# Patient Record
Sex: Female | Born: 1950 | Race: Black or African American | Hispanic: No | Marital: Single | Smoking: Former smoker
Health system: Southern US, Community
[De-identification: ages and names within clinical notes are randomized; demographics above are authoritative.]

## PROBLEM LIST (undated history)

## (undated) DIAGNOSIS — J449 Chronic obstructive pulmonary disease, unspecified: Secondary | ICD-10-CM

## (undated) DIAGNOSIS — I10 Essential (primary) hypertension: Secondary | ICD-10-CM

## (undated) DIAGNOSIS — I429 Cardiomyopathy, unspecified: Secondary | ICD-10-CM

## (undated) DIAGNOSIS — M1990 Unspecified osteoarthritis, unspecified site: Secondary | ICD-10-CM

## (undated) DIAGNOSIS — R011 Cardiac murmur, unspecified: Secondary | ICD-10-CM

## (undated) DIAGNOSIS — Z720 Tobacco use: Secondary | ICD-10-CM

## (undated) DIAGNOSIS — C801 Malignant (primary) neoplasm, unspecified: Secondary | ICD-10-CM

## (undated) HISTORY — DX: Malignant (primary) neoplasm, unspecified: C80.1

## (undated) HISTORY — PX: ABDOMINAL HYSTERECTOMY: SHX81

## (undated) HISTORY — PX: BILATERAL OOPHORECTOMY: SHX1221

---

## 2000-01-12 ENCOUNTER — Other Ambulatory Visit: Admission: RE | Admit: 2000-01-12 | Discharge: 2000-01-12 | Payer: Self-pay | Admitting: Family Medicine

## 2001-10-03 ENCOUNTER — Other Ambulatory Visit: Admission: RE | Admit: 2001-10-03 | Discharge: 2001-10-03 | Payer: Self-pay | Admitting: Family Medicine

## 2001-10-16 ENCOUNTER — Ambulatory Visit (HOSPITAL_COMMUNITY): Admission: RE | Admit: 2001-10-16 | Discharge: 2001-10-16 | Payer: Self-pay | Admitting: Family Medicine

## 2002-11-27 ENCOUNTER — Other Ambulatory Visit: Admission: RE | Admit: 2002-11-27 | Discharge: 2002-11-27 | Payer: Self-pay | Admitting: Family Medicine

## 2003-12-11 ENCOUNTER — Ambulatory Visit (HOSPITAL_COMMUNITY): Admission: RE | Admit: 2003-12-11 | Discharge: 2003-12-11 | Payer: Self-pay | Admitting: General Surgery

## 2004-01-20 ENCOUNTER — Other Ambulatory Visit: Admission: RE | Admit: 2004-01-20 | Discharge: 2004-01-20 | Payer: Self-pay | Admitting: Family Medicine

## 2004-08-19 ENCOUNTER — Ambulatory Visit (HOSPITAL_COMMUNITY): Admission: RE | Admit: 2004-08-19 | Discharge: 2004-08-19 | Payer: Self-pay | Admitting: Family Medicine

## 2005-12-06 ENCOUNTER — Other Ambulatory Visit: Admission: RE | Admit: 2005-12-06 | Discharge: 2005-12-06 | Payer: Self-pay | Admitting: Family Medicine

## 2005-12-29 ENCOUNTER — Ambulatory Visit (HOSPITAL_COMMUNITY): Admission: RE | Admit: 2005-12-29 | Discharge: 2005-12-29 | Payer: Self-pay | Admitting: Obstetrics

## 2006-02-10 ENCOUNTER — Inpatient Hospital Stay (HOSPITAL_COMMUNITY): Admission: RE | Admit: 2006-02-10 | Discharge: 2006-02-12 | Payer: Self-pay | Admitting: Obstetrics

## 2006-02-10 ENCOUNTER — Encounter (INDEPENDENT_AMBULATORY_CARE_PROVIDER_SITE_OTHER): Payer: Self-pay | Admitting: Specialist

## 2007-09-03 ENCOUNTER — Other Ambulatory Visit: Admission: RE | Admit: 2007-09-03 | Discharge: 2007-09-03 | Payer: Self-pay | Admitting: Family Medicine

## 2008-02-28 ENCOUNTER — Encounter: Admission: RE | Admit: 2008-02-28 | Discharge: 2008-02-28 | Payer: Self-pay | Admitting: Family Medicine

## 2008-08-12 ENCOUNTER — Encounter: Admission: RE | Admit: 2008-08-12 | Discharge: 2008-08-12 | Payer: Self-pay | Admitting: Orthopedic Surgery

## 2010-06-27 ENCOUNTER — Encounter: Payer: Self-pay | Admitting: Family Medicine

## 2010-09-29 ENCOUNTER — Other Ambulatory Visit: Payer: Self-pay | Admitting: Orthopedic Surgery

## 2010-09-29 ENCOUNTER — Ambulatory Visit
Admission: RE | Admit: 2010-09-29 | Discharge: 2010-09-29 | Disposition: A | Discharge status: Home / Self Care | Payer: PRIVATE HEALTH INSURANCE | Source: Ambulatory Visit | Attending: Orthopedic Surgery | Admitting: Orthopedic Surgery

## 2010-09-29 DIAGNOSIS — M25511 Pain in right shoulder: Secondary | ICD-10-CM

## 2010-10-22 NOTE — Op Note (Signed)
NAMEMERIDIAN, SCHERGER              ACCOUNT NO.:  [id number]   MEDICAL RECORD NO.:  [id number]          PATIENT TYPE:  INP   LOCATION:  9316                          FACILITY:  [facility]   PHYSICIAN:  Kathreen Cosier, M.D.DATE OF BIRTH:  05-12-1951   DATE OF PROCEDURE:  02/10/2006  DATE OF DISCHARGE:                                 OPERATIVE REPORT   PREOPERATIVE DIAGNOSES:  10 cm right ovarian cyst.   POSTOPERATIVE DIAGNOSES:  10 cm right ovarian cyst.   SURGEON:  Dr. Francoise Ceo.   FIRST ASSISTANT:  Dr. Coral Ceo.   ANESTHESIA:  General.   PROCEDURE:  TAH/BSO.   DESCRIPTION OF PROCEDURE:  Under general anesthesia, the patient in the  supine position after being prepped and draped. The bladder emptied with a  Foley catheter. A transverse suprapubic incision made and carried down to  the rectus fascia. The fascia cleaned and incised the length of the  incision. The recti muscles retracted laterally, the peritoneum incised  longitudinally. The left ovary was normal. The uterus was small and there  was a smooth wall 10 x 7 cm right ovarian mass present. The right round  ligament was wrapped with a Kelly clamp but suture ligated with #1 chromic,  procedure done in a similar fashion on the other side. The right  infundibulopelvic ligament was grasped, cut, suture ligated with #1 chromic.  Procedure done in a similar fashion on the other side. The Metzenbaum  scissors was used to dissect the bladder off of the cervix. The right  uterine vessels double clamped with Heaney clamps, cut and suture ligated  x2. Procedure done in a similar fashion on the other side. Straight Kocher  clamps used to grasp the cardinal and uterosacral ligaments on the right.  Cut and suture ligated with #1 chromic. Specimen consisting of the ovaries,  tube, uterus removed at the cervicovaginal junction with Mayo scissors. The  vaginal vault was closed with two sutures of #1 chromic. Hemostasis was  satisfactory. Operative site was reperitonealized with 2-0 chromic. Lap and  sponge counts correct. Blood loss less than 100 mL. Abdomen closed in  layers, peritoneum with continuous suture of #0 chromic, fascia with  continuous suture of #0 Dexon. The skin closed with subcuticular stitch of 4-  0 Monocryl. The patient tolerated the procedure well and was taken to the  recovery room in good condition.           ______________________________  Kathreen Cosier, M.D.     BAM/MEDQ  D:  02/10/2006  T:  02/11/2006  Job:  [id number]

## 2010-10-22 NOTE — Discharge Summary (Signed)
NAME:  Erin Pacheco, Erin Pacheco              ACCOUNT NO.:  [id number]   MEDICAL RECORD NO.:  [id number]          PATIENT TYPE:  INP   LOCATION:  9316                          FACILITY:  [facility]   PHYSICIAN:  Kathreen Cosier, M.D.DATE OF BIRTH:  08-21-1950   DATE OF ADMISSION:  02/10/2006  DATE OF DISCHARGE:  02/12/2006                                 DISCHARGE SUMMARY   HISTORY OF PRESENT ILLNESS:  The patient is a 60 year old gravida 3, para 3-  0-0-3, who looks older than her stated age.  She was admitted because of a  10 cm right ovarian mass for a TAH and BSO. On admission, her hemoglobin was  12.8. White count 6.8. Platelets 279,000. PTT 32, sodium 141, potassium 3.8,  chloride 105, creatinine 0.7. Total protein 6.8. Albumin 3.9. Total  bilirubin 0.6. Urinalysis was negative. She underwent a TAH and BSO.  Postoperative, her hemoglobin was 11.9. White count 9.1. She has a history  of hypertension. The patient states that she takes half of a 25 mg  hydrochlorothiazide daily. Postoperative, her blood pressures were elevated  and she was started on 25 mg p.o. daily. On the day of discharge, she was  given 50 mg of hydrochlorothiazide. Blood pressure was at 170/90. She was  discharged home on the second postoperative day, to see Dr. Parke Simmers on  February 13, 2006 for blood pressure regulation.   DISCHARGE DIAGNOSES:  Status post total abdominal hysterectomy and bilateral  salpingo-oophorectomy with 10 cm right ovarian mass.           ______________________________  Kathreen Cosier, M.D.     BAM/MEDQ  D:  02/12/2006  T:  02/12/2006  Job:  [id number]

## 2010-12-23 ENCOUNTER — Encounter (HOSPITAL_COMMUNITY): Payer: Self-pay

## 2010-12-23 ENCOUNTER — Encounter (HOSPITAL_COMMUNITY)
Admission: RE | Admit: 2010-12-23 | Discharge: 2010-12-23 | Disposition: A | Discharge status: Home / Self Care | Payer: PRIVATE HEALTH INSURANCE | Source: Ambulatory Visit | Attending: Ophthalmology | Admitting: Ophthalmology

## 2010-12-23 ENCOUNTER — Other Ambulatory Visit: Payer: Self-pay

## 2010-12-23 HISTORY — DX: Essential (primary) hypertension: I10

## 2010-12-23 HISTORY — DX: Unspecified osteoarthritis, unspecified site: M19.90

## 2010-12-23 LAB — CBC
Hemoglobin: 13 g/dL (ref 12.0–15.0)
MCH: 29.9 pg (ref 26.0–34.0)
MCV: 88 fL (ref 78.0–100.0)
Platelets: 278 10*3/uL (ref 150–400)

## 2010-12-23 LAB — BASIC METABOLIC PANEL: GFR calc Af Amer: >60 mL/min (ref >60)

## 2010-12-23 NOTE — Patient Instructions (Signed)
20 Erin Pacheco  12/23/2010   Your procedure is scheduled on:  1130  Report to [facility] at 1130 AM.  Call this number if you have problems the morning of surgery: [phone]   Remember:   Do not eat food:After Midnight.  Do not drink clear liquids: After Midnight.  Take these medicines the morning of surgery with A SIP OF WATER: decadron & tekturna   Do not wear jewelry, make-up or nail polish.  Do not bring valuables to the hospital.  Contacts, dentures or bridgework may not be worn into surgery.  Leave suitcase in the car. After surgery it may be brought to your room.  For patients admitted to the hospital, checkout time is 11:00 AM the day of discharge.   Patients discharged the day of surgery will not be allowed to drive home.  Name and phone number of your driver: family  Special Instructions: N/A   Please read over the following fact sheets that you were given: Pain Booklet PATIENT INSTRUCTIONS POST-ANESTHESIA  IMMEDIATELY FOLLOWING SURGERY:  Do not drive or operate machinery for the first twenty four hours after surgery.  Do not make any important decisions for twenty four hours after surgery or while taking narcotic pain medications or sedatives.  If you develop intractable nausea and vomiting or a severe headache please notify your doctor immediately.  FOLLOW-UP:  Please make an appointment with your surgeon as instructed. You do not need to follow up with anesthesia unless specifically instructed to do so.  WOUND CARE INSTRUCTIONS (if applicable):  Keep a dry clean dressing on the anesthesia/puncture wound site if there is drainage.  Once the wound has quit draining you may leave it open to air.  Generally you should leave the bandage intact for twenty four hours unless there is drainage.  If the epidural site drains for more than 36-48 hours please call the anesthesia department.  QUESTIONS?:  Please feel free to call your physician or the hospital operator if you have  any questions, and they will be happy to assist you.     [facility] Anesthesia Department [address] [place] [phone]

## 2010-12-27 ENCOUNTER — Encounter (HOSPITAL_COMMUNITY)
Admission: RE | Disposition: A | Discharge status: Home / Self Care | Payer: Self-pay | Source: Ambulatory Visit | Attending: Ophthalmology

## 2010-12-27 ENCOUNTER — Encounter (HOSPITAL_COMMUNITY): Payer: Self-pay | Admitting: Anesthesiology

## 2010-12-27 ENCOUNTER — Ambulatory Visit (HOSPITAL_COMMUNITY)
Admission: RE | Admit: 2010-12-27 | Discharge: 2010-12-27 | Disposition: A | Discharge status: Home / Self Care | Payer: PRIVATE HEALTH INSURANCE | Source: Ambulatory Visit | Attending: Ophthalmology | Admitting: Ophthalmology

## 2010-12-27 ENCOUNTER — Encounter (HOSPITAL_COMMUNITY): Payer: Self-pay | Admitting: Ophthalmology

## 2010-12-27 ENCOUNTER — Ambulatory Visit (HOSPITAL_COMMUNITY): Payer: PRIVATE HEALTH INSURANCE | Admitting: Anesthesiology

## 2010-12-27 DIAGNOSIS — Z79899 Other long term (current) drug therapy: Secondary | ICD-10-CM | POA: Insufficient documentation

## 2010-12-27 DIAGNOSIS — H2510 Age-related nuclear cataract, unspecified eye: Secondary | ICD-10-CM | POA: Insufficient documentation

## 2010-12-27 DIAGNOSIS — Z01812 Encounter for preprocedural laboratory examination: Secondary | ICD-10-CM | POA: Insufficient documentation

## 2010-12-27 DIAGNOSIS — I10 Essential (primary) hypertension: Secondary | ICD-10-CM | POA: Insufficient documentation

## 2010-12-27 HISTORY — PX: CATARACT EXTRACTION W/PHACO: SHX586

## 2010-12-27 SURGERY — PHACOEMULSIFICATION, CATARACT, WITH IOL INSERTION
Anesthesia: Monitor Anesthesia Care | Site: Eye | Laterality: Right | Wound class: Clean

## 2010-12-27 MED ORDER — LIDOCAINE HCL (PF) 1 % IJ SOLN: INTRAMUSCULAR | Status: DC | PRN

## 2010-12-27 MED ORDER — MIDAZOLAM HCL 5 MG/5ML IJ SOLN
INTRAMUSCULAR | Status: AC
  Administered 2010-12-27: 2 mg via INTRAVENOUS
  Filled 2010-12-27: qty 5

## 2010-12-27 MED ORDER — CYCLOPENTOLATE-PHENYLEPHRINE 0.2-1 % OP SOLN
OPHTHALMIC | Status: AC
  Administered 2010-12-27: 1 [drp] via OPHTHALMIC
  Filled 2010-12-27: qty 2

## 2010-12-27 MED ORDER — LIDOCAINE HCL (PF) 1 % IJ SOLN
INTRAMUSCULAR | Status: DC | PRN
  Administered 2010-12-27: .3 mL

## 2010-12-27 MED ORDER — LIDOCAINE HCL 3.5 % OP GEL
OPHTHALMIC | Status: AC
  Administered 2010-12-27: 1 via OPHTHALMIC
  Filled 2010-12-27: qty 5

## 2010-12-27 MED ORDER — NEOMYCIN-POLYMYXIN-DEXAMETH 0.1 % OP OINT
TOPICAL_OINTMENT | OPHTHALMIC | Status: DC | PRN
  Administered 2010-12-27: 1 via OPHTHALMIC

## 2010-12-27 MED ORDER — PHENYLEPHRINE HCL 2.5 % OP SOLN
OPHTHALMIC | Status: AC
  Administered 2010-12-27: 1 [drp] via OPHTHALMIC
  Filled 2010-12-27: qty 2

## 2010-12-27 MED ORDER — POVIDONE-IODINE 5 % OP SOLN
OPHTHALMIC | Status: DC | PRN
  Administered 2010-12-27: 1 via OPHTHALMIC

## 2010-12-27 MED ORDER — PROVISC 10 MG/ML IO SOLN
INTRAOCULAR | Status: DC | PRN
  Administered 2010-12-27: 8.5 mg via OPHTHALMIC

## 2010-12-27 MED ORDER — PROVISC 10 MG/ML IO SOLN: INTRAOCULAR | Status: DC | PRN

## 2010-12-27 MED ORDER — LIDOCAINE HCL 3.5 % OP GEL
1.0000 "application " | Freq: Once | OPHTHALMIC | Status: AC
  Administered 2010-12-27: 1 via OPHTHALMIC

## 2010-12-27 MED ORDER — EPINEPHRINE HCL 1 MG/ML IJ SOLN
INTRAMUSCULAR | Status: AC
  Filled 2010-12-27: qty 1

## 2010-12-27 MED ORDER — NEOMYCIN-POLYMYXIN-DEXAMETH 3.5-10000-0.1 OP OINT
TOPICAL_OINTMENT | OPHTHALMIC | Status: AC
  Filled 2010-12-27: qty 3.5

## 2010-12-27 MED ORDER — TETRACAINE HCL 0.5 % OP SOLN
1.0000 [drp] | OPHTHALMIC | Status: AC
  Administered 2010-12-27 (×3): 1 [drp] via OPHTHALMIC

## 2010-12-27 MED ORDER — BSS IO SOLN: INTRAOCULAR | Status: DC | PRN

## 2010-12-27 MED ORDER — PHENYLEPHRINE HCL 2.5 % OP SOLN
1.0000 [drp] | OPHTHALMIC | Status: AC
  Administered 2010-12-27 (×3): 1 [drp] via OPHTHALMIC

## 2010-12-27 MED ORDER — LACTATED RINGERS IV SOLN
INTRAVENOUS | Status: DC | PRN
  Administered 2010-12-27: 16:00:00 via INTRAVENOUS

## 2010-12-27 MED ORDER — POVIDONE-IODINE 5 % OP SOLN: OPHTHALMIC | Status: DC | PRN

## 2010-12-27 MED ORDER — LIDOCAINE HCL 3.5 % OP GEL: OPHTHALMIC | Status: DC | PRN

## 2010-12-27 MED ORDER — LACTATED RINGERS IV SOLN
INTRAVENOUS | Status: DC
  Administered 2010-12-27: 500 mL via INTRAVENOUS

## 2010-12-27 MED ORDER — TETRACAINE HCL 0.5 % OP SOLN
OPHTHALMIC | Status: AC
  Administered 2010-12-27: 1 [drp] via OPHTHALMIC
  Filled 2010-12-27: qty 2

## 2010-12-27 MED ORDER — MIDAZOLAM HCL 2 MG/2ML IJ SOLN
1.0000 mg | INTRAMUSCULAR | Status: DC | PRN
  Administered 2010-12-27: 2 mg via INTRAVENOUS

## 2010-12-27 MED ORDER — EPINEPHRINE HCL 1 MG/ML IJ SOLN
INTRAOCULAR | Status: DC | PRN
  Administered 2010-12-27: 16:00:00

## 2010-12-27 MED ORDER — EPINEPHRINE HCL 1 MG/ML IJ SOLN: INTRAOCULAR | Status: DC | PRN

## 2010-12-27 MED ORDER — BSS IO SOLN
INTRAOCULAR | Status: DC | PRN
  Administered 2010-12-27: 15 mL via OPHTHALMIC

## 2010-12-27 MED ORDER — CYCLOPENTOLATE-PHENYLEPHRINE 0.2-1 % OP SOLN
1.0000 [drp] | OPHTHALMIC | Status: AC
  Administered 2010-12-27 (×3): 1 [drp] via OPHTHALMIC

## 2010-12-27 SURGICAL SUPPLY — 31 items
CAPSULAR TENSION RING-AMO (OPHTHALMIC RELATED) IMPLANT
CLOTH BEACON ORANGE TIMEOUT ST (SAFETY) ×1 IMPLANT
DUOVISC SYSTEM (INTRAOCULAR LENS)
GLOVE BIO SURGEON STRL SZ 6.5 (GLOVE) ×1 IMPLANT
GLOVE BIOGEL PI IND STRL 6.5 (GLOVE) IMPLANT
GLOVE BIOGEL PI IND STRL 7.0 (GLOVE) IMPLANT
GLOVE BIOGEL PI IND STRL 7.5 (GLOVE) IMPLANT
GLOVE BIOGEL PI INDICATOR 6.5 (GLOVE)
GLOVE BIOGEL PI INDICATOR 7.0 (GLOVE)
GLOVE BIOGEL PI INDICATOR 7.5 (GLOVE)
GLOVE ECLIPSE 6.5 STRL STRAW (GLOVE) IMPLANT
GLOVE ECLIPSE 7.0 STRL STRAW (GLOVE) IMPLANT
GLOVE ECLIPSE 7.5 STRL STRAW (GLOVE) IMPLANT
GLOVE EXAM NITRILE LRG STRL (GLOVE) IMPLANT
GLOVE EXAM NITRILE MD LF STRL (GLOVE) ×1 IMPLANT
GLOVE SKINSENSE NS SZ6.5 (GLOVE)
GLOVE SKINSENSE NS SZ7.0 (GLOVE)
GLOVE SKINSENSE STRL SZ6.5 (GLOVE) IMPLANT
GLOVE SKINSENSE STRL SZ7.0 (GLOVE) IMPLANT
KIT VITRECTOMY (OPHTHALMIC RELATED) IMPLANT
PAD ARMBOARD 7.5X6 YLW CONV (MISCELLANEOUS) ×1 IMPLANT
PROC W NO LENS (INTRAOCULAR LENS)
PROC W SPEC LENS (INTRAOCULAR LENS)
PROCESS W NO LENS (INTRAOCULAR LENS) IMPLANT
PROCESS W SPEC LENS (INTRAOCULAR LENS) IMPLANT
RING MALYGIN (MISCELLANEOUS) IMPLANT
SIGHTPATH CAT PROC W REG LENS (Ophthalmic Related) ×2 IMPLANT
SYR TB 1ML LL NO SAFETY (SYRINGE) ×1 IMPLANT
SYSTEM DUOVISC (INTRAOCULAR LENS) IMPLANT
VISCOELASTIC ADDITIONAL (OPHTHALMIC RELATED) IMPLANT
WATER STERILE IRR 250ML POUR (IV SOLUTION) ×1 IMPLANT

## 2010-12-27 NOTE — Anesthesia Postprocedure Evaluation (Signed)
  Anesthesia Post-op Note  Patient: Erin Pacheco  Procedure(s) Performed:  CATARACT EXTRACTION PHACO AND INTRAOCULAR LENS PLACEMENT (IOC)  Patient Location: PACU and Short Stay  Anesthesia Type: MAC  Level of Consciousness: awake and alert   Airway and Oxygen Therapy: Patient Spontanous Breathing  Post-op Pain: none  Post-op Assessment: Post-op Vital signs reviewed  Post-op Vital Signs: stable  Complications: No apparent anesthesia complications

## 2010-12-27 NOTE — Brief Op Note (Signed)
12/27/2010  4:18 PM  PATIENT:  Erin Pacheco  60 y.o. female  PRE-OPERATIVE DIAGNOSIS:  nuclear cataract right eye  POST-OPERATIVE DIAGNOSIS:  nuclear cataract right eye, CDE 17.24  PROCEDURE:  Procedure(s): CATARACT EXTRACTION PHACO AND INTRAOCULAR LENS PLACEMENT (IOC)  SURGEON:  Surgeon(s): Gemma Payor   ANESTHESIA:   local and IV sedation

## 2010-12-27 NOTE — Transfer of Care (Signed)
Immediate Anesthesia Transfer of Care Note  Patient: Erin Pacheco  Procedure(s) Performed:  CATARACT EXTRACTION PHACO AND INTRAOCULAR LENS PLACEMENT (IOC)  Patient Location: PACU and Short Stay  Anesthesia Type: MAC  Level of Consciousness: awake and alert   Airway & Oxygen Therapy: Patient Spontanous Breathing  Post-op Assessment: Report given to PACU RN and Post -op Vital signs reviewed and stable  Post vital signs: Reviewed  Complications: No apparent anesthesia complications

## 2010-12-27 NOTE — H&P (Signed)
I have evaluated the patient preoperatively, and have identified no interval changes in medical condition and plan of care since the history and physical of record 

## 2010-12-27 NOTE — Anesthesia Preprocedure Evaluation (Signed)
Anesthesia Evaluation  Name, MR# and DOB Patient awake  General Assessment Comment  Airway Mallampati: I  Neck ROM: Full    Dental  (+) Edentulous Upper and Edentulous Lower   Pulmonary  clear to auscultation    Cardiovascular hypertension, Pt. on medications Regular Normal   Neuro/Psych  GI/Hepatic/Renal   Endo/Other   Abdominal   Musculoskeletal  (+) Arthritis -,  Hematology   Peds  Reproductive/Obstetrics   Anesthesia Other Findings             Anesthesia Physical Anesthesia Plan  ASA: II  Anesthesia Plan: MAC   Post-op Pain Management:    Induction:   Airway Management Planned: Nasal Cannula  Additional Equipment:   Intra-op Plan:   Post-operative Plan:   Informed Consent: I have reviewed the patients History and Physical, chart, labs and discussed the procedure including the risks, benefits and alternatives for the proposed anesthesia with the patient or authorized representative who has indicated his/her understanding and acceptance.     Plan Discussed with:   Anesthesia Plan Comments:         Anesthesia Quick Evaluation

## 2010-12-28 NOTE — Op Note (Signed)
NAME:  Erin Pacheco, Erin Pacheco              ACCOUNT NO.:  [id number]  MEDICAL RECORD NO.:  [id number]  LOCATION:  APPO                          FACILITY:  [facility]  PHYSICIAN:  Susanne Greenhouse, MD       DATE OF BIRTH:  09-Apr-1951  DATE OF PROCEDURE:  12/27/2010 DATE OF DISCHARGE:  12/27/2010                              OPERATIVE REPORT   PREOPERATIVE DIAGNOSIS:  Nuclear cataract, right eye, diagnosis code 366.16.  POSTOPERATIVE DIAGNOSIS:  Nuclear cataract, right eye, diagnosis code 366.16.  OPERATION PERFORMED:  Phacoemulsification with posterior chamber intraocular lens implantation, right eye.  SURGEON:  Susanne Greenhouse, MD  ANESTHESIA:  General endotracheal anesthesia.  OPERATIVE SUMMARY:  In the preoperative area, dilating drops were placed into the right eye.  The patient was then brought into the operating room where she was placed under general anesthesia.  The eye was then prepped and draped.  Beginning with a 75 blade, a paracentesis port was made at the surgeon's 2 o'clock position.  The anterior chamber was then filled with a 1% nonpreserved lidocaine solution with epinephrine.  This was followed by Viscoat to deepen the chamber.  A small fornix-based peritomy was performed superiorly.  Next, a single iris hook was placed through the limbus superiorly.  A 2.4-mm keratome blade was then used to make a clear corneal incision over the iris hook.  A bent cystotome needle and Utrata forceps were used to create a continuous tear capsulotomy.  Hydrodissection was performed using balanced salt solution on a fine cannula.  The lens nucleus was then removed using phacoemulsification in a quadrant cracking technique.  The cortical material was then removed with irrigation and aspiration.  The capsular bag and anterior chamber were refilled with Provisc.  The wound was widened to approximately 3 mm and a posterior chamber intraocular lens was placed into the capsular bag without difficulty  using an [employer] lens injecting system.  A single 10-0 nylon suture was then used to close the incision as well as stromal hydration.  The Provisc was removed from the anterior chamber and capsular bag with irrigation and aspiration.  At this point, the wounds were tested for leak, which were negative.  The anterior chamber remained deep and stable.  The patient tolerated the procedure well.  There were no operative complications, and she awoke from general anesthesia without problem.  No surgical specimens.  Prosthetic device used is a Lenstec posterior chamber lens, model Softec HD, power of 17.0, serial number was [id number].          ______________________________ Susanne Greenhouse, MD     KEH/MEDQ  D:  12/27/2010  T:  12/28/2010  Job:  [id number]

## 2010-12-28 NOTE — OR Nursing (Signed)
Inadvertently  documented  on chart prior to procedure.

## 2011-01-17 ENCOUNTER — Encounter: Payer: Self-pay | Admitting: Emergency Medicine

## 2011-03-22 NOTE — Patient Instructions (Addendum)
20 Erin Pacheco  03/22/2011   Your procedure is scheduled on:  03/28/2011  Report to [facility] at  1130  AM.  Call this number if you have problems the morning of surgery: [phone]   Remember:   Do not eat food:After Midnight.  Do not drink clear liquids: After Midnight.  Take these medicines the morning of surgery with A SIP OF WATER: tekturna,decadron   Do not wear jewelry, make-up or nail polish.  Do not wear lotions, powders, or perfumes. You may wear deodorant.  Do not shave 48 hours prior to surgery.  Do not bring valuables to the hospital.  Contacts, dentures or bridgework may not be worn into surgery.  Leave suitcase in the car. After surgery it may be brought to your room.  For patients admitted to the hospital, checkout time is 11:00 AM the day of discharge.   Patients discharged the day of surgery will not be allowed to drive home.  Name and phone number of your driver:family  Special Instructions: N/A   Please read over the following fact sheets that you were given: Pain Booklet, Surgical Site Infection Prevention, Anesthesia Post-op Instructions and Care and Recovery After Surgery PATIENT INSTRUCTIONS POST-ANESTHESIA  IMMEDIATELY FOLLOWING SURGERY:  Do not drive or operate machinery for the first twenty four hours after surgery.  Do not make any important decisions for twenty four hours after surgery or while taking narcotic pain medications or sedatives.  If you develop intractable nausea and vomiting or a severe headache please notify your doctor immediately.  FOLLOW-UP:  Please make an appointment with your surgeon as instructed. You do not need to follow up with anesthesia unless specifically instructed to do so.  WOUND CARE INSTRUCTIONS (if applicable):  Keep a dry clean dressing on the anesthesia/puncture wound site if there is drainage.  Once the wound has quit draining you may leave it open to air.  Generally you should leave the bandage intact for twenty  four hours unless there is drainage.  If the epidural site drains for more than 36-48 hours please call the anesthesia department.  QUESTIONS?:  Please feel free to call your physician or the hospital operator if you have any questions, and they will be happy to assist you.     [facility] Anesthesia Department [address] [place] [phone]

## 2011-03-23 ENCOUNTER — Encounter (HOSPITAL_COMMUNITY): Payer: Self-pay

## 2011-03-23 ENCOUNTER — Encounter (HOSPITAL_COMMUNITY)
Admission: RE | Admit: 2011-03-23 | Discharge: 2011-03-23 | Disposition: A | Discharge status: Home / Self Care | Payer: PRIVATE HEALTH INSURANCE | Source: Ambulatory Visit | Attending: Ophthalmology | Admitting: Ophthalmology

## 2011-03-23 LAB — CBC
MCH: 29.5 pg (ref 26.0–34.0)
MCV: 86.8 fL (ref 78.0–100.0)
Platelets: 319 10*3/uL (ref 150–400)
RBC: 4.55 MIL/uL (ref 3.87–5.11)
RDW: 15.8 % — ABNORMAL HIGH (ref 11.5–15.5)
WBC: 13.5 10*3/uL — ABNORMAL HIGH (ref 4.0–10.5)

## 2011-03-23 LAB — BASIC METABOLIC PANEL
CO2: 30 mEq/L (ref 19–32)
Calcium: 9.8 mg/dL (ref 8.4–10.5)
Creatinine, Ser: 0.82 mg/dL (ref 0.50–1.10)
GFR calc Af Amer: 89 mL/min — ABNORMAL LOW (ref >90)
Sodium: 138 mEq/L (ref 135–145)

## 2011-03-28 ENCOUNTER — Encounter (HOSPITAL_COMMUNITY): Payer: Self-pay | Admitting: Anesthesiology

## 2011-03-28 ENCOUNTER — Encounter (HOSPITAL_COMMUNITY): Payer: Self-pay | Admitting: Ophthalmology

## 2011-03-28 ENCOUNTER — Ambulatory Visit (HOSPITAL_COMMUNITY)
Admission: RE | Admit: 2011-03-28 | Discharge: 2011-03-28 | Disposition: A | Discharge status: Home / Self Care | Payer: PRIVATE HEALTH INSURANCE | Source: Ambulatory Visit | Attending: Ophthalmology | Admitting: Ophthalmology

## 2011-03-28 ENCOUNTER — Ambulatory Visit (HOSPITAL_COMMUNITY): Payer: PRIVATE HEALTH INSURANCE | Admitting: Anesthesiology

## 2011-03-28 ENCOUNTER — Encounter (HOSPITAL_COMMUNITY)
Admission: RE | Disposition: A | Discharge status: Home / Self Care | Payer: Self-pay | Source: Ambulatory Visit | Attending: Ophthalmology

## 2011-03-28 DIAGNOSIS — Z01812 Encounter for preprocedural laboratory examination: Secondary | ICD-10-CM | POA: Insufficient documentation

## 2011-03-28 DIAGNOSIS — H2510 Age-related nuclear cataract, unspecified eye: Secondary | ICD-10-CM | POA: Insufficient documentation

## 2011-03-28 DIAGNOSIS — Z7982 Long term (current) use of aspirin: Secondary | ICD-10-CM | POA: Insufficient documentation

## 2011-03-28 DIAGNOSIS — Z79899 Other long term (current) drug therapy: Secondary | ICD-10-CM | POA: Insufficient documentation

## 2011-03-28 DIAGNOSIS — I10 Essential (primary) hypertension: Secondary | ICD-10-CM | POA: Insufficient documentation

## 2011-03-28 HISTORY — PX: CATARACT EXTRACTION W/PHACO: SHX586

## 2011-03-28 SURGERY — PHACOEMULSIFICATION, CATARACT, WITH IOL INSERTION
Anesthesia: Monitor Anesthesia Care | Site: Eye | Laterality: Left | Wound class: Clean

## 2011-03-28 MED ORDER — LIDOCAINE HCL (PF) 1 % IJ SOLN
INTRAMUSCULAR | Status: AC
  Filled 2011-03-28: qty 2

## 2011-03-28 MED ORDER — BSS IO SOLN
INTRAOCULAR | Status: DC | PRN
  Administered 2011-03-28: 15 mL via OPHTHALMIC

## 2011-03-28 MED ORDER — PHENYLEPHRINE HCL 2.5 % OP SOLN
OPHTHALMIC | Status: AC
  Administered 2011-03-28: 1 [drp] via OPHTHALMIC
  Filled 2011-03-28: qty 2

## 2011-03-28 MED ORDER — LIDOCAINE HCL 3.5 % OP GEL
1.0000 "application " | Freq: Once | OPHTHALMIC | Status: AC
  Administered 2011-03-28: 1 via OPHTHALMIC

## 2011-03-28 MED ORDER — NEOMYCIN-POLYMYXIN-DEXAMETH 3.5-10000-0.1 OP OINT
TOPICAL_OINTMENT | OPHTHALMIC | Status: AC
  Filled 2011-03-28: qty 3.5

## 2011-03-28 MED ORDER — CYCLOPENTOLATE-PHENYLEPHRINE 0.2-1 % OP SOLN
1.0000 [drp] | OPHTHALMIC | Status: AC
  Administered 2011-03-28 (×3): 1 [drp] via OPHTHALMIC

## 2011-03-28 MED ORDER — PROVISC 10 MG/ML IO SOLN
INTRAOCULAR | Status: DC | PRN
  Administered 2011-03-28: 8.5 mg via OPHTHALMIC

## 2011-03-28 MED ORDER — MIDAZOLAM HCL 2 MG/2ML IJ SOLN
1.0000 mg | INTRAMUSCULAR | Status: DC | PRN
  Administered 2011-03-28: 2 mg via INTRAVENOUS

## 2011-03-28 MED ORDER — NEOMYCIN-POLYMYXIN-DEXAMETH 0.1 % OP OINT
TOPICAL_OINTMENT | OPHTHALMIC | Status: DC | PRN
  Administered 2011-03-28: 1 via OPHTHALMIC

## 2011-03-28 MED ORDER — TETRACAINE HCL 0.5 % OP SOLN
OPHTHALMIC | Status: AC
  Administered 2011-03-28: 1 [drp] via OPHTHALMIC
  Filled 2011-03-28: qty 2

## 2011-03-28 MED ORDER — LIDOCAINE HCL 3.5 % OP GEL
OPHTHALMIC | Status: AC
  Administered 2011-03-28: 1 via OPHTHALMIC
  Filled 2011-03-28: qty 5

## 2011-03-28 MED ORDER — POVIDONE-IODINE 5 % OP SOLN
OPHTHALMIC | Status: DC | PRN
  Administered 2011-03-28: 1 via OPHTHALMIC

## 2011-03-28 MED ORDER — CYCLOPENTOLATE-PHENYLEPHRINE 0.2-1 % OP SOLN
OPHTHALMIC | Status: AC
  Administered 2011-03-28: 1 [drp] via OPHTHALMIC
  Filled 2011-03-28: qty 2

## 2011-03-28 MED ORDER — TETRACAINE HCL 0.5 % OP SOLN
1.0000 [drp] | OPHTHALMIC | Status: AC
  Administered 2011-03-28 (×3): 1 [drp] via OPHTHALMIC

## 2011-03-28 MED ORDER — EPINEPHRINE HCL 1 MG/ML IJ SOLN
INTRAOCULAR | Status: DC | PRN
  Administered 2011-03-28: 14:00:00

## 2011-03-28 MED ORDER — EPINEPHRINE HCL 1 MG/ML IJ SOLN
INTRAMUSCULAR | Status: AC
  Filled 2011-03-28: qty 1

## 2011-03-28 MED ORDER — MIDAZOLAM HCL 2 MG/2ML IJ SOLN
INTRAMUSCULAR | Status: AC
  Administered 2011-03-28: 2 mg via INTRAVENOUS
  Filled 2011-03-28: qty 2

## 2011-03-28 MED ORDER — LIDOCAINE HCL (PF) 1 % IJ SOLN
INTRAMUSCULAR | Status: DC | PRN
  Administered 2011-03-28: .4 mL

## 2011-03-28 MED ORDER — PHENYLEPHRINE HCL 2.5 % OP SOLN
1.0000 [drp] | OPHTHALMIC | Status: AC
  Administered 2011-03-28 (×3): 1 [drp] via OPHTHALMIC

## 2011-03-28 MED ORDER — LIDOCAINE 3.5 % OP GEL OPTIME - NO CHARGE
OPHTHALMIC | Status: DC | PRN
  Administered 2011-03-28: 1 [drp] via OPHTHALMIC

## 2011-03-28 MED ORDER — LACTATED RINGERS IV SOLN
INTRAVENOUS | Status: DC
  Administered 2011-03-28: 13:00:00 via INTRAVENOUS

## 2011-03-28 SURGICAL SUPPLY — 34 items
CAPSULAR TENSION RING-AMO (OPHTHALMIC RELATED) IMPLANT
CLOTH BEACON ORANGE TIMEOUT ST (SAFETY) ×1 IMPLANT
DUOVISC SYSTEM (INTRAOCULAR LENS)
EYE SHIELD UNIVERSAL CLEAR (GAUZE/BANDAGES/DRESSINGS) ×1 IMPLANT
GLOVE BIO SURGEON STRL SZ 6.5 (GLOVE) IMPLANT
GLOVE BIOGEL PI IND STRL 6.5 (GLOVE) IMPLANT
GLOVE BIOGEL PI IND STRL 7.0 (GLOVE) IMPLANT
GLOVE BIOGEL PI IND STRL 7.5 (GLOVE) IMPLANT
GLOVE BIOGEL PI INDICATOR 6.5 (GLOVE)
GLOVE BIOGEL PI INDICATOR 7.0 (GLOVE) ×1
GLOVE BIOGEL PI INDICATOR 7.5 (GLOVE)
GLOVE ECLIPSE 6.5 STRL STRAW (GLOVE) IMPLANT
GLOVE ECLIPSE 7.0 STRL STRAW (GLOVE) IMPLANT
GLOVE ECLIPSE 7.5 STRL STRAW (GLOVE) IMPLANT
GLOVE EXAM NITRILE LRG STRL (GLOVE) ×1 IMPLANT
GLOVE EXAM NITRILE MD LF STRL (GLOVE) IMPLANT
GLOVE SKINSENSE NS SZ6.5 (GLOVE)
GLOVE SKINSENSE NS SZ7.0 (GLOVE)
GLOVE SKINSENSE STRL SZ6.5 (GLOVE) IMPLANT
GLOVE SKINSENSE STRL SZ7.0 (GLOVE) IMPLANT
KIT VITRECTOMY (OPHTHALMIC RELATED) IMPLANT
PAD ARMBOARD 7.5X6 YLW CONV (MISCELLANEOUS) ×1 IMPLANT
PROC W NO LENS (INTRAOCULAR LENS)
PROC W SPEC LENS (INTRAOCULAR LENS)
PROCESS W NO LENS (INTRAOCULAR LENS) IMPLANT
PROCESS W SPEC LENS (INTRAOCULAR LENS) IMPLANT
RING MALYGIN (MISCELLANEOUS) IMPLANT
SIGHTPATH CAT PROC W REG LENS (Ophthalmic Related) ×2 IMPLANT
SYR TB 1ML LL NO SAFETY (SYRINGE) ×1 IMPLANT
SYSTEM DUOVISC (INTRAOCULAR LENS) IMPLANT
TAPE SURG TRANSPORE 1 IN (GAUZE/BANDAGES/DRESSINGS) IMPLANT
TAPE SURGICAL TRANSPORE 1 IN (GAUZE/BANDAGES/DRESSINGS) ×1
VISCOELASTIC ADDITIONAL (OPHTHALMIC RELATED) IMPLANT
WATER STERILE IRR 250ML POUR (IV SOLUTION) ×1 IMPLANT

## 2011-03-28 NOTE — Anesthesia Postprocedure Evaluation (Signed)
  Anesthesia Post-op Note  Patient: Erin Pacheco  Procedure(s) Performed:  CATARACT EXTRACTION PHACO AND INTRAOCULAR LENS PLACEMENT (IOC) - CDE 7.27  Patient Location: PACU  Anesthesia Type: MAC  Level of Consciousness: awake  Airway and Oxygen Therapy: Patient Spontanous Breathing  Post-op Pain: none  Post-op Assessment: Post-op Vital signs reviewed  Post-op Vital Signs: Reviewed and stable  Complications: No apparent anesthesia complications

## 2011-03-28 NOTE — Transfer of Care (Signed)
Immediate Anesthesia Transfer of Care Note  Patient: [name]  Procedure(s) Performed:  CATARACT EXTRACTION PHACO AND INTRAOCULAR LENS PLACEMENT (IOC) - CDE 7.27  Patient Location: PACU and Short Stay  Anesthesia Type: MAC  Level of Consciousness: awake  Airway & Oxygen Therapy: Patient Spontanous Breathing  Post-op Assessment: Report given to PACU RN  Post vital signs: Reviewed and stable  Complications: No apparent anesthesia complications

## 2011-03-28 NOTE — H&P (Signed)
I have reviewed the H&P, the patient was re-examined, and I have identified no interval changes in medical condition and plan of care since the history and physical of record  

## 2011-03-28 NOTE — Brief Op Note (Signed)
Pre-Op Dx: Cataract OS Post-Op Dx: Cataract OS Surgeon: Eilleen Davoli Anesthesia: Topical with MAC Implant: Lenstec, Model Softec HD Specimen: None Complications: None 

## 2011-03-28 NOTE — Anesthesia Preprocedure Evaluation (Signed)
Anesthesia Evaluation  Patient identified by MRN, date of birth, ID band Patient awake  General Assessment Comment  Reviewed: Allergy & Precautions, H&P , NPO status   History of Anesthesia Complications Negative for: history of anesthetic complications  Airway Mallampati: I  Neck ROM: Full    Dental  (+) Edentulous Upper and Edentulous Lower   Pulmonary  clear to auscultation        Cardiovascular hypertension, Pt. on medications Regular Normal    Neuro/Psych    GI/Hepatic   Endo/Other    Renal/GU      Musculoskeletal  (+) Arthritis -,   Abdominal   Peds  Hematology   Anesthesia Other Findings   Reproductive/Obstetrics                           Anesthesia Physical Anesthesia Plan  ASA: II  Anesthesia Plan: MAC   Post-op Pain Management:    Induction:   Airway Management Planned: Nasal Cannula  Additional Equipment:   Intra-op Plan:   Post-operative Plan:   Informed Consent: I have reviewed the patients History and Physical, chart, labs and discussed the procedure including the risks, benefits and alternatives for the proposed anesthesia with the patient or authorized representative who has indicated his/her understanding and acceptance.     Plan Discussed with:   Anesthesia Plan Comments:         Anesthesia Quick Evaluation

## 2011-03-29 NOTE — Op Note (Signed)
NAME:  Erin Pacheco, Erin Pacheco              ACCOUNT NO.:  [id number]  MEDICAL RECORD NO.:  [id number]  LOCATION:  APPO                          FACILITY:  [facility]  PHYSICIAN:  Susanne Greenhouse, MD       DATE OF BIRTH:  04-11-1951  DATE OF PROCEDURE:  03/28/2011 DATE OF DISCHARGE:  03/28/2011                              OPERATIVE REPORT   PREOPERATIVE DIAGNOSIS:  Nuclear cataract, left eye.  Diagnosis code 366.16.  POSTOPERATIVE DIAGNOSIS:  Nuclear cataract, left eye.  Diagnosis code 366.16.  SURGEON:  Bonne Dolores. Phi Avans, MD  ANESTHESIA:  Topical with monitored anesthesia care.  DESCRIPTION OF OPERATION:  In the preoperative holding area, dilating drops and viscous lidocaine were placed into the left eye.  The patient was then brought to the operating room where she was prepped and draped. Beginning with a 75 blade, a paracentesis port was made at the surgeon's 2 o'clock position.  The anterior chamber was then filled with a 1% nonpreserved lidocaine solution.  This was followed by instilling Provisc into the anterior chamber through the paracentesis port.  A 2.4 mm keratome blade was then used to make a clear corneal incision at the temporal limbus.  A bent cystotome needle was used to create a continuous tear capsulotomy.  Hydrodissection was performed with balanced salt solution on a fine cannula.  Lens nucleus was then removed using phacoemulsification in a quadrant cracking technique.  Residual cortex was removed with irrigation and aspiration.  The capsular bag and anterior chamber were refilled with Provisc, and the poster chamber interocular was placed in the capsular bag without difficulty using its lens injecting system.  The Provisc was then removed from the capsular bag and anterior chamber with irrigation and aspiration.  Stromal hydration of the main incision and paracentesis ports was performed with balanced salt solution on a fine cannula.  The wounds were tested for leak which  were negative.  The patient tolerated procedure well.  There were no operative complications, and she was returned to recovery area in satisfactory condition.  No surgical specimens.  Prosthetic device used is a Lenstec posterior chamber lens, model Softec HD, power of 15.5, serial number is [id number].          ______________________________ Susanne Greenhouse, MD     KEH/MEDQ  D:  03/28/2011  T:  03/29/2011  Job:  [id number]

## 2011-04-01 ENCOUNTER — Encounter (HOSPITAL_COMMUNITY): Payer: Self-pay | Admitting: Ophthalmology

## 2012-05-30 ENCOUNTER — Emergency Department (HOSPITAL_COMMUNITY)
Admission: EM | Admit: 2012-05-30 | Discharge: 2012-05-30 | Disposition: A | Discharge status: Home / Self Care | Payer: PRIVATE HEALTH INSURANCE | Attending: Emergency Medicine | Admitting: Emergency Medicine

## 2012-05-30 ENCOUNTER — Emergency Department (HOSPITAL_COMMUNITY): Payer: PRIVATE HEALTH INSURANCE

## 2012-05-30 ENCOUNTER — Encounter (HOSPITAL_COMMUNITY): Payer: Self-pay | Admitting: Emergency Medicine

## 2012-05-30 ENCOUNTER — Other Ambulatory Visit: Payer: Self-pay

## 2012-05-30 DIAGNOSIS — Z7982 Long term (current) use of aspirin: Secondary | ICD-10-CM | POA: Insufficient documentation

## 2012-05-30 DIAGNOSIS — Z8679 Personal history of other diseases of the circulatory system: Secondary | ICD-10-CM | POA: Insufficient documentation

## 2012-05-30 DIAGNOSIS — Z8739 Personal history of other diseases of the musculoskeletal system and connective tissue: Secondary | ICD-10-CM | POA: Insufficient documentation

## 2012-05-30 DIAGNOSIS — I10 Essential (primary) hypertension: Secondary | ICD-10-CM | POA: Insufficient documentation

## 2012-05-30 DIAGNOSIS — R55 Syncope and collapse: Secondary | ICD-10-CM | POA: Insufficient documentation

## 2012-05-30 DIAGNOSIS — F17200 Nicotine dependence, unspecified, uncomplicated: Secondary | ICD-10-CM | POA: Insufficient documentation

## 2012-05-30 DIAGNOSIS — Z79899 Other long term (current) drug therapy: Secondary | ICD-10-CM | POA: Insufficient documentation

## 2012-05-30 HISTORY — DX: Cardiac murmur, unspecified: R01.1

## 2012-05-30 LAB — CBC WITH DIFFERENTIAL/PLATELET
Eosinophils Relative: 2 % (ref 0–5)
HCT: 43.2 % (ref 36.0–46.0)
Hemoglobin: 14.5 g/dL (ref 12.0–15.0)
Lymphocytes Relative: 35 % (ref 12–46)
MCV: 86.7 fL (ref 78.0–100.0)
Monocytes Absolute: 0.7 10*3/uL (ref 0.1–1.0)
Monocytes Relative: 8 % (ref 3–12)
Neutro Abs: 4.6 10*3/uL (ref 1.7–7.7)
RDW: 15.4 % (ref 11.5–15.5)
WBC: 8.6 10*3/uL (ref 4.0–10.5)

## 2012-05-30 LAB — BASIC METABOLIC PANEL
Chloride: 98 mEq/L (ref 96–112)
Creatinine, Ser: 1.17 mg/dL — ABNORMAL HIGH (ref 0.50–1.10)
GFR calc Af Amer: 57 mL/min — ABNORMAL LOW (ref >90)
GFR calc non Af Amer: 49 mL/min — ABNORMAL LOW (ref >90)
Potassium: 3.1 mEq/L — ABNORMAL LOW (ref 3.5–5.1)

## 2012-05-30 MED ORDER — POTASSIUM CHLORIDE 20 MEQ PO PACK
20.0000 meq | PACK | Freq: Once | ORAL | Status: AC
Start: 2012-05-30 — End: 2012-05-30
  Administered 2012-05-30: 20 meq via ORAL
  Filled 2012-05-30: qty 1

## 2012-05-30 MED ORDER — LORAZEPAM 1 MG PO TABS: 1.0000 mg | ORAL_TABLET | Freq: Three times a day (TID) | ORAL | Status: DC | PRN

## 2012-05-30 MED ORDER — SODIUM CHLORIDE 0.9 % IV BOLUS (SEPSIS): 1000.0000 mL | Freq: Once | INTRAVENOUS | Status: DC

## 2012-05-30 NOTE — ED Notes (Signed)
Family member states patient was in a chair and started shaking with "seizure activity." Patient has no history of seizures. Patient is alert and oriented at triage.

## 2012-05-30 NOTE — ED Provider Notes (Signed)
History  This chart was scribed for Donnetta Hutching, MD by Manuela Schwartz, ED scribe. This patient was seen in room [facility] and the patient's care was started at 2005.   CSN: [id number]  Arrival date & time 05/30/12  2005   First MD Initiated Contact with Patient 05/30/12 2023      Chief Complaint  Patient presents with  . Seizures   Patient is a 61 y.o. female presenting with seizures. The history is provided by the patient. No language interpreter was used.  Seizures  Pertinent negatives include no nausea and no vomiting.   Erin Pacheco is a 61 y.o. female .  Daughter reports patient was shaking for 1-2 seconds, then became limp, and began staring in space. She did not have a frank postictal stage. Lots of stress lately. Lucila Maine was murdered on Monday. No previous history of seizure. No fever, chills, stiff neck, neuro deficits.  Past Medical History  Diagnosis Date  . Hypertension   . Arthritis   . Heart murmur     Past Surgical History  Procedure Date  . Abdominal hysterectomy   . Bilateral oophorectomy   . Cataract extraction w/phaco 12/27/2010    Procedure: CATARACT EXTRACTION PHACO AND INTRAOCULAR LENS PLACEMENT (IOC);  Surgeon: Gemma Payor;  Location: [facility];  Service: Ophthalmology;  Laterality: Right;  . Cataract extraction w/phaco 03/28/2011    Procedure: CATARACT EXTRACTION PHACO AND INTRAOCULAR LENS PLACEMENT (IOC);  Surgeon: Gemma Payor;  Location: [facility];  Service: Ophthalmology;  Laterality: Left;  CDE 7.27    Family History  Problem Relation Age of Onset  . Anesthesia problems Neg Hx   . Hypotension Neg Hx   . Malignant hyperthermia Neg Hx   . Pseudochol deficiency Neg Hx     History  Substance Use Topics  . Smoking status: Current Every Day Smoker -- 0.5 packs/day for 30 years    Types: Cigarettes  . Smokeless tobacco: Not on file  . Alcohol Use: No    OB History    Grav Para Term Preterm Abortions TAB SAB Ect Mult Living                   Review of Systems  Constitutional: Negative for fever and chills.  Respiratory: Negative for shortness of breath.   Gastrointestinal: Negative for nausea and vomiting.  Neurological: Positive for seizures. Negative for weakness.  All other systems reviewed and are negative.    Allergies  Review of patient's allergies indicates no known allergies.  Home Medications   Current Outpatient Rx  Name  Route  Sig  Dispense  Refill  . ALISKIREN FUMARATE 150 MG PO TABS   Oral   Take 150 mg by mouth daily.           . ASPIRIN 325 MG PO TABS   Oral   Take 325 mg by mouth daily as needed. For pain          . DEXAMETHASONE 4 MG PO TABS   Oral   Take 4 mg by mouth daily as needed. For arthritis pain         . IBUPROFEN 200 MG PO TABS   Oral   Take 400-600 mg by mouth every 6 (six) hours as needed. Pain          . ONE-DAILY MULTI VITAMINS PO TABS   Oral   Take 1 tablet by mouth daily.           Frazier Butt OP  Ophthalmic   Apply 1 drop to eye daily as needed. Dry Eyes            Triage vitals: BP 109/64  Pulse 79  Temp 98.4 F (36.9 C) (Oral)  Resp 16  Ht 5\' 5"  (1.651 m)  Wt 145 lb (65.772 kg)  BMI 24.13 kg/m2  SpO2 97%  Physical Exam  Nursing note and vitals reviewed. Constitutional: She is oriented to person, place, and time. She appears well-developed and well-nourished.  HENT:  Head: Normocephalic and atraumatic.  Eyes: Conjunctivae normal and EOM are normal. Pupils are equal, round, and reactive to light.  Neck: Normal range of motion. Neck supple.  Cardiovascular: Normal rate, regular rhythm and normal heart sounds.   Pulmonary/Chest: Effort normal and breath sounds normal.  Abdominal: Soft. Bowel sounds are normal.  Musculoskeletal: Normal range of motion.  Neurological: She is alert and oriented to person, place, and time.  Skin: Skin is warm and dry.  Psychiatric: She has a normal mood and affect.    ED Course  Procedures (including  critical care time) DIAGNOSTIC STUDIES: Oxygen Saturation is 97% on room air, normal by my interpretation.    COORDINATION OF CARE:   Labs Reviewed  BASIC METABOLIC PANEL - Abnormal; Notable for the following:    Potassium 3.1 (*)     CO2 33 (*)     Glucose, Bld 115 (*)     Creatinine, Ser 1.17 (*)     GFR calc non Af Amer 49 (*)     GFR calc Af Amer 57 (*)     All other components within normal limits  CBC WITH DIFFERENTIAL  URINALYSIS, ROUTINE W REFLEX MICROSCOPIC    No results found. No results found.   No diagnosis found.  Date: 05/30/2012  Rate: 81  Rhythm: normal sinus rhythm  QRS Axis: normal  Intervals: normal  ST/T Wave abnormalities: normal  Conduction Disutrbances:left bundle branch block  Narrative Interpretation:   Old EKG Reviewed: none available  Ct Head Wo Contrast  05/30/2012  *RADIOLOGY REPORT*  Clinical Data: Seizure today.  Current history of hypertension.  CT HEAD WITHOUT CONTRAST  Technique:  Contiguous axial images were obtained from the base of the skull through the vertex without contrast.  Comparison: None.  Findings: Low lying cerebellar tonsils. Ventricular system normal in size and appearance for age.   No mass lesion.  No midline shift.  No acute hemorrhage or hematoma.  No extra-axial fluid collections.  No evidence of acute infarction.  No focal brain parenchymal abnormality.  No skull fracture or other focal osseous abnormality involving the skull.  Visualized paranasal sinuses, bilateral mastoid air cells, and bilateral middle ear cavities well-aerated.  Bilateral carotid siphon atherosclerosis.  IMPRESSION:  1.  No acute intracranial abnormality. 2.  Low-lying cerebellar tonsils.   Original Report Authenticated By: Hulan Saas, M.D.     MDM   I'm not convinced this was a seizure.  Could have been a syncopal spell. Normal physical exam in the ER.  Discharge  with Ativan 1 mg #20.             Donnetta Hutching, MD 05/30/12 [phone]

## 2013-03-27 ENCOUNTER — Other Ambulatory Visit: Payer: Self-pay | Admitting: Orthopedic Surgery

## 2013-03-27 ENCOUNTER — Ambulatory Visit (INDEPENDENT_AMBULATORY_CARE_PROVIDER_SITE_OTHER): Payer: PRIVATE HEALTH INSURANCE | Admitting: Neurology

## 2013-03-27 ENCOUNTER — Encounter (INDEPENDENT_AMBULATORY_CARE_PROVIDER_SITE_OTHER): Payer: Self-pay | Admitting: Radiology

## 2013-03-27 ENCOUNTER — Ambulatory Visit
Admission: RE | Admit: 2013-03-27 | Discharge: 2013-03-27 | Disposition: A | Discharge status: Home / Self Care | Payer: PRIVATE HEALTH INSURANCE | Source: Ambulatory Visit | Attending: Orthopedic Surgery | Admitting: Orthopedic Surgery

## 2013-03-27 DIAGNOSIS — M542 Cervicalgia: Secondary | ICD-10-CM

## 2013-03-27 DIAGNOSIS — Z0289 Encounter for other administrative examinations: Secondary | ICD-10-CM

## 2013-03-27 DIAGNOSIS — M530 Cervicocranial syndrome: Secondary | ICD-10-CM

## 2013-03-27 DIAGNOSIS — M479 Spondylosis, unspecified: Secondary | ICD-10-CM

## 2013-03-27 DIAGNOSIS — G5600 Carpal tunnel syndrome, unspecified upper limb: Secondary | ICD-10-CM

## 2013-03-27 NOTE — Procedures (Signed)
    [facility]  NCS (NERVE CONDUCTION STUDY) WITH EMG (ELECTROMYOGRAPHY) REPORT   STUDY DATE: 03/27/2013 PATIENT NAME: Erin Pacheco DOB: [birth year] MRN: [id number]    TECHNOLOGIST: Kaylyn Lim ELECTROMYOGRAPHER: Levert Feinstein M.D.  CLINICAL INFORMATION:   62 years old [place] American female, with few-month history of bilateral hands paresthesia, and muscle weakness,  On examination: She has moderate left abductor pollicis brevis muscle atrophy, weakness, moderate left opponents weakness, she also has mild right abductor pollicis brevis, and opponents weakness.  There was decreased pinprick at first 3 fingerpads, Bilateral wrist Tinel signs were present  FINDINGS: NERVE CONDUCTION STUDY:  Bilateral ulnar sensory and motor responses were normal. Left median sensory response was absent. Left median motor response was absent.  Right median sensory response showed moderately prolonged distal latency, with decreased snap amplitude. Right median motor responses showed mildly prolonged distal latency, normal CMAP amplitude and conduction velocity.   NEEDLE ELECTROMYOGRAPHY: Selected needle exam was performed at bilateral abductor pollicis brevis.  Left abductor pollicis brevis: Increased insertion activity, 2 plus spontaneous activity, complex enlarged motor unit potential  with decreased recruitment patterns.  Right abductor pollicis brevis: Increased insertion activity, no spontaneous activity, mild enlarged motor unit potential, with mildly decreased recruitment patterns     IMPRESSION:  This is an abnormal study. There is electrodiagnostic evidence of bilateral median neuropathy across the wrist, consistent with bilateral carpal tunnel syndromes, left-sided is severe, right-sided is moderate.  She would benefit the left carpal tunnel release surgery.    INTERPRETING PHYSICIAN:   Levert Feinstein M.D. Ph.D. [facility] [address], [address] [place], [place] [address] [phone]

## 2014-04-01 ENCOUNTER — Other Ambulatory Visit (HOSPITAL_COMMUNITY): Payer: Self-pay | Admitting: Family Medicine

## 2014-04-01 DIAGNOSIS — Z1231 Encounter for screening mammogram for malignant neoplasm of breast: Secondary | ICD-10-CM

## 2014-04-10 ENCOUNTER — Ambulatory Visit (HOSPITAL_COMMUNITY)
Admission: RE | Admit: 2014-04-10 | Discharge: 2014-04-10 | Disposition: A | Discharge status: Home / Self Care | Payer: PRIVATE HEALTH INSURANCE | Source: Ambulatory Visit | Attending: Family Medicine | Admitting: Family Medicine

## 2014-04-10 DIAGNOSIS — Z1231 Encounter for screening mammogram for malignant neoplasm of breast: Secondary | ICD-10-CM | POA: Diagnosis present

## 2015-04-27 ENCOUNTER — Other Ambulatory Visit (HOSPITAL_COMMUNITY): Payer: Self-pay | Admitting: Family Medicine

## 2015-04-27 DIAGNOSIS — Z1231 Encounter for screening mammogram for malignant neoplasm of breast: Secondary | ICD-10-CM

## 2015-05-04 ENCOUNTER — Ambulatory Visit (HOSPITAL_COMMUNITY)
Admission: RE | Admit: 2015-05-04 | Discharge: 2015-05-04 | Disposition: A | Discharge status: Home / Self Care | Payer: 59 | Source: Ambulatory Visit | Attending: Family Medicine | Admitting: Family Medicine

## 2015-05-04 DIAGNOSIS — Z1231 Encounter for screening mammogram for malignant neoplasm of breast: Secondary | ICD-10-CM | POA: Diagnosis not present

## 2016-02-12 ENCOUNTER — Other Ambulatory Visit (HOSPITAL_COMMUNITY): Payer: Self-pay | Admitting: Family Medicine

## 2016-02-12 DIAGNOSIS — Z1231 Encounter for screening mammogram for malignant neoplasm of breast: Secondary | ICD-10-CM

## 2016-05-04 ENCOUNTER — Ambulatory Visit (HOSPITAL_COMMUNITY): Payer: PRIVATE HEALTH INSURANCE

## 2016-05-11 ENCOUNTER — Ambulatory Visit (HOSPITAL_COMMUNITY): Payer: PRIVATE HEALTH INSURANCE

## 2016-05-26 ENCOUNTER — Other Ambulatory Visit (HOSPITAL_COMMUNITY): Payer: Self-pay | Admitting: Family Medicine

## 2016-05-26 ENCOUNTER — Ambulatory Visit (HOSPITAL_COMMUNITY)
Admission: RE | Admit: 2016-05-26 | Discharge: 2016-05-26 | Disposition: A | Discharge status: Home / Self Care | Payer: Medicare HMO | Source: Ambulatory Visit | Attending: Family Medicine | Admitting: Family Medicine

## 2016-05-26 DIAGNOSIS — Z1231 Encounter for screening mammogram for malignant neoplasm of breast: Secondary | ICD-10-CM | POA: Insufficient documentation

## 2016-05-26 DIAGNOSIS — R928 Other abnormal and inconclusive findings on diagnostic imaging of breast: Secondary | ICD-10-CM | POA: Insufficient documentation

## 2016-06-01 ENCOUNTER — Other Ambulatory Visit: Payer: Self-pay | Admitting: Family Medicine

## 2016-06-01 DIAGNOSIS — R928 Other abnormal and inconclusive findings on diagnostic imaging of breast: Secondary | ICD-10-CM

## 2016-07-05 DIAGNOSIS — I10 Essential (primary) hypertension: Secondary | ICD-10-CM | POA: Diagnosis not present

## 2016-07-05 DIAGNOSIS — E785 Hyperlipidemia, unspecified: Secondary | ICD-10-CM | POA: Diagnosis not present

## 2016-07-05 DIAGNOSIS — M1990 Unspecified osteoarthritis, unspecified site: Secondary | ICD-10-CM | POA: Diagnosis not present

## 2016-08-30 DIAGNOSIS — R7301 Impaired fasting glucose: Secondary | ICD-10-CM | POA: Diagnosis not present

## 2016-08-30 DIAGNOSIS — Z6826 Body mass index (BMI) 26.0-26.9, adult: Secondary | ICD-10-CM | POA: Diagnosis not present

## 2016-08-30 DIAGNOSIS — E7800 Pure hypercholesterolemia, unspecified: Secondary | ICD-10-CM | POA: Diagnosis not present

## 2016-08-30 DIAGNOSIS — E785 Hyperlipidemia, unspecified: Secondary | ICD-10-CM | POA: Diagnosis not present

## 2016-08-30 DIAGNOSIS — I10 Essential (primary) hypertension: Secondary | ICD-10-CM | POA: Diagnosis not present

## 2016-08-30 DIAGNOSIS — R7309 Other abnormal glucose: Secondary | ICD-10-CM | POA: Diagnosis not present

## 2016-09-02 ENCOUNTER — Other Ambulatory Visit: Payer: Self-pay | Admitting: Family Medicine

## 2016-09-02 DIAGNOSIS — N6320 Unspecified lump in the left breast, unspecified quadrant: Secondary | ICD-10-CM

## 2016-09-05 ENCOUNTER — Other Ambulatory Visit (HOSPITAL_COMMUNITY): Payer: Self-pay | Admitting: Family Medicine

## 2016-09-05 DIAGNOSIS — N6320 Unspecified lump in the left breast, unspecified quadrant: Secondary | ICD-10-CM

## 2016-09-13 ENCOUNTER — Other Ambulatory Visit (HOSPITAL_COMMUNITY): Payer: Self-pay | Admitting: Family Medicine

## 2016-09-13 DIAGNOSIS — N6320 Unspecified lump in the left breast, unspecified quadrant: Secondary | ICD-10-CM

## 2016-09-27 ENCOUNTER — Ambulatory Visit (HOSPITAL_COMMUNITY)
Admission: RE | Admit: 2016-09-27 | Discharge: 2016-09-27 | Disposition: A | Discharge status: Home / Self Care | Payer: Medicare HMO | Source: Ambulatory Visit | Attending: Family Medicine | Admitting: Family Medicine

## 2016-09-27 DIAGNOSIS — R922 Inconclusive mammogram: Secondary | ICD-10-CM | POA: Diagnosis not present

## 2016-09-27 DIAGNOSIS — N6320 Unspecified lump in the left breast, unspecified quadrant: Secondary | ICD-10-CM | POA: Insufficient documentation

## 2016-09-27 DIAGNOSIS — R928 Other abnormal and inconclusive findings on diagnostic imaging of breast: Secondary | ICD-10-CM | POA: Diagnosis not present

## 2016-12-29 DIAGNOSIS — R7309 Other abnormal glucose: Secondary | ICD-10-CM | POA: Diagnosis not present

## 2016-12-29 DIAGNOSIS — E785 Hyperlipidemia, unspecified: Secondary | ICD-10-CM | POA: Diagnosis not present

## 2016-12-29 DIAGNOSIS — I10 Essential (primary) hypertension: Secondary | ICD-10-CM | POA: Diagnosis not present

## 2017-01-27 DIAGNOSIS — Z0000 Encounter for general adult medical examination without abnormal findings: Secondary | ICD-10-CM | POA: Diagnosis not present

## 2017-05-05 ENCOUNTER — Other Ambulatory Visit (HOSPITAL_COMMUNITY): Payer: Self-pay | Admitting: Family Medicine

## 2017-05-05 DIAGNOSIS — M1990 Unspecified osteoarthritis, unspecified site: Secondary | ICD-10-CM | POA: Diagnosis not present

## 2017-05-05 DIAGNOSIS — Z09 Encounter for follow-up examination after completed treatment for conditions other than malignant neoplasm: Secondary | ICD-10-CM

## 2017-05-05 DIAGNOSIS — M25512 Pain in left shoulder: Secondary | ICD-10-CM | POA: Diagnosis not present

## 2017-05-05 DIAGNOSIS — R7301 Impaired fasting glucose: Secondary | ICD-10-CM | POA: Diagnosis not present

## 2017-05-05 DIAGNOSIS — I10 Essential (primary) hypertension: Secondary | ICD-10-CM | POA: Diagnosis not present

## 2017-05-05 DIAGNOSIS — E7800 Pure hypercholesterolemia, unspecified: Secondary | ICD-10-CM | POA: Diagnosis not present

## 2017-05-16 ENCOUNTER — Ambulatory Visit (HOSPITAL_COMMUNITY)
Admission: RE | Admit: 2017-05-16 | Discharge: 2017-05-16 | Disposition: A | Discharge status: Home / Self Care | Payer: Medicare HMO | Source: Ambulatory Visit | Attending: Family Medicine | Admitting: Family Medicine

## 2017-05-16 DIAGNOSIS — Z09 Encounter for follow-up examination after completed treatment for conditions other than malignant neoplasm: Secondary | ICD-10-CM

## 2017-05-16 DIAGNOSIS — N6012 Diffuse cystic mastopathy of left breast: Secondary | ICD-10-CM | POA: Diagnosis not present

## 2017-05-16 DIAGNOSIS — N6320 Unspecified lump in the left breast, unspecified quadrant: Secondary | ICD-10-CM | POA: Diagnosis not present

## 2017-05-16 DIAGNOSIS — R928 Other abnormal and inconclusive findings on diagnostic imaging of breast: Secondary | ICD-10-CM | POA: Diagnosis not present

## 2017-08-24 DIAGNOSIS — M75102 Unspecified rotator cuff tear or rupture of left shoulder, not specified as traumatic: Secondary | ICD-10-CM | POA: Diagnosis not present

## 2017-08-25 ENCOUNTER — Other Ambulatory Visit (HOSPITAL_COMMUNITY): Payer: Self-pay | Admitting: Family Medicine

## 2017-08-25 ENCOUNTER — Ambulatory Visit (HOSPITAL_COMMUNITY)
Admission: RE | Admit: 2017-08-25 | Discharge: 2017-08-25 | Disposition: A | Discharge status: Home / Self Care | Payer: Medicare HMO | Source: Ambulatory Visit | Attending: Family Medicine | Admitting: Family Medicine

## 2017-08-25 DIAGNOSIS — M19012 Primary osteoarthritis, left shoulder: Secondary | ICD-10-CM | POA: Insufficient documentation

## 2017-08-25 DIAGNOSIS — M75102 Unspecified rotator cuff tear or rupture of left shoulder, not specified as traumatic: Secondary | ICD-10-CM | POA: Diagnosis present

## 2017-08-30 ENCOUNTER — Other Ambulatory Visit: Payer: Self-pay

## 2017-08-30 ENCOUNTER — Emergency Department (HOSPITAL_COMMUNITY): Payer: Medicare HMO

## 2017-08-30 ENCOUNTER — Emergency Department (HOSPITAL_COMMUNITY)
Admission: EM | Admit: 2017-08-30 | Discharge: 2017-08-30 | Disposition: A | Discharge status: Home / Self Care | Payer: Medicare HMO | Attending: Emergency Medicine | Admitting: Emergency Medicine

## 2017-08-30 ENCOUNTER — Encounter (HOSPITAL_COMMUNITY): Payer: Self-pay | Admitting: Emergency Medicine

## 2017-08-30 DIAGNOSIS — F17210 Nicotine dependence, cigarettes, uncomplicated: Secondary | ICD-10-CM | POA: Diagnosis not present

## 2017-08-30 DIAGNOSIS — S0993XA Unspecified injury of face, initial encounter: Secondary | ICD-10-CM | POA: Diagnosis not present

## 2017-08-30 DIAGNOSIS — I10 Essential (primary) hypertension: Secondary | ICD-10-CM | POA: Insufficient documentation

## 2017-08-30 DIAGNOSIS — Y92513 Shop (commercial) as the place of occurrence of the external cause: Secondary | ICD-10-CM | POA: Diagnosis not present

## 2017-08-30 DIAGNOSIS — Z79899 Other long term (current) drug therapy: Secondary | ICD-10-CM | POA: Insufficient documentation

## 2017-08-30 DIAGNOSIS — S0003XA Contusion of scalp, initial encounter: Secondary | ICD-10-CM | POA: Diagnosis not present

## 2017-08-30 DIAGNOSIS — Y9389 Activity, other specified: Secondary | ICD-10-CM | POA: Insufficient documentation

## 2017-08-30 DIAGNOSIS — S0033XA Contusion of nose, initial encounter: Secondary | ICD-10-CM | POA: Diagnosis not present

## 2017-08-30 DIAGNOSIS — S098XXA Other specified injuries of head, initial encounter: Secondary | ICD-10-CM | POA: Diagnosis present

## 2017-08-30 DIAGNOSIS — W010XXA Fall on same level from slipping, tripping and stumbling without subsequent striking against object, initial encounter: Secondary | ICD-10-CM | POA: Diagnosis not present

## 2017-08-30 DIAGNOSIS — Y999 Unspecified external cause status: Secondary | ICD-10-CM | POA: Insufficient documentation

## 2017-08-30 DIAGNOSIS — W19XXXA Unspecified fall, initial encounter: Secondary | ICD-10-CM

## 2017-08-30 DIAGNOSIS — S0083XA Contusion of other part of head, initial encounter: Secondary | ICD-10-CM

## 2017-08-30 DIAGNOSIS — R69 Illness, unspecified: Secondary | ICD-10-CM | POA: Diagnosis not present

## 2017-08-30 DIAGNOSIS — R51 Headache: Secondary | ICD-10-CM | POA: Diagnosis not present

## 2017-08-30 NOTE — ED Triage Notes (Signed)
Pt states was at [employer] store and tripped over swing leg that was  Sticking out. Pt has abrasion and swelling to front of face.

## 2017-08-30 NOTE — ED Provider Notes (Signed)
[facility] EMERGENCY DEPARTMENT Provider Note   CSN: [id number] Arrival date & time: 08/30/17  2010     History   Chief Complaint Chief Complaint  Patient presents with  . Fall    hit head    HPI Erin Pacheco is a 67 y.o. female.  HPI Patient presents after a fall.  She was at the store and tripped over something.  [employer] and hit her face.  No loss conscious.  Happened around 330 today.  Family member came home and said she needed to come to the ER.  Has a dull headache.  Swelling over her face and bridge of the nose.  No numbness or weakness.  Did have some mild neck pain went to the right shoulder but states that is resolved with some Motrin. Past Medical History:  Diagnosis Date  . Arthritis   . Heart murmur   . Hypertension     There are no active problems to display for this patient.   Past Surgical History:  Procedure Laterality Date  . ABDOMINAL HYSTERECTOMY    . BILATERAL OOPHORECTOMY    . CATARACT EXTRACTION W/PHACO  12/27/2010   Procedure: CATARACT EXTRACTION PHACO AND INTRAOCULAR LENS PLACEMENT (IOC);  Surgeon: Gemma Payor;  Location: [facility];  Service: Ophthalmology;  Laterality: Right;  . CATARACT EXTRACTION W/PHACO  03/28/2011   Procedure: CATARACT EXTRACTION PHACO AND INTRAOCULAR LENS PLACEMENT (IOC);  Surgeon: Gemma Payor;  Location: [facility];  Service: Ophthalmology;  Laterality: Left;  CDE 7.27     OB History   None      Home Medications    Prior to Admission medications   Medication Sig Start Date End Date Taking? Authorizing Provider  aliskiren (TEKTURNA) 150 MG tablet Take 150 mg by mouth daily.      [provider]  aspirin 325 MG tablet Take 325 mg by mouth daily as needed. For pain     [provider]  dexamethasone (DECADRON) 4 MG tablet Take 4 mg by mouth daily as needed. For arthritis pain    [provider]  ibuprofen (ADVIL,MOTRIN) 200 MG tablet Take 400-600 mg by mouth every 6 (six) hours as needed. Pain      [provider]  LORazepam (ATIVAN) 1 MG tablet Take 1 tablet (1 mg total) by mouth 3 (three) times daily as needed for anxiety. 05/30/12   Donnetta Hutching, MD  Multiple Vitamin (MULTIVITAMIN) tablet Take 1 tablet by mouth daily.      [provider]  Polyethyl Glycol-Propyl Glycol (SYSTANE OP) Apply 1 drop to eye daily as needed. Dry Eyes     [provider]    Family History Family History  Problem Relation Age of Onset  . Anesthesia problems Neg Hx   . Hypotension Neg Hx   . Malignant hyperthermia Neg Hx   . Pseudochol deficiency Neg Hx     Social History Social History   Tobacco Use  . Smoking status: Current Every Day Smoker    Packs/day: 0.50    Years: 30.00    Pack years: 15.00    Types: Cigarettes  . Smokeless tobacco: Never Used  Substance Use Topics  . Alcohol use: No  . Drug use: No     Allergies   Patient has no known allergies.   Review of Systems Review of Systems  Constitutional: Negative for appetite change.  HENT: Negative for congestion and dental problem.   Respiratory: Negative for shortness of breath.   Cardiovascular: Negative for  chest pain.  Gastrointestinal: Negative for abdominal distention.  Endocrine: Negative for polyuria.  Genitourinary: Negative for flank pain.  Musculoskeletal: Negative for back pain.  Skin: Negative for rash and wound.  Neurological: Positive for headaches.  Psychiatric/Behavioral: Negative for behavioral problems and confusion.     Physical Exam Updated Vital Signs BP 113/63 (BP Location: Right Arm)   Pulse 85   Temp 98.7 F (37.1 C) (Oral)   Resp 20   Ht 5\' 3"  (1.6 m)   Wt 68.9 kg (152 lb)   SpO2 98%   BMI 26.93 kg/m   Physical Exam  Constitutional: She appears well-developed.  HENT:  Swelling between her eyebrows and on the bridge of nose.  Mild tenderness over bridge of nose and right infraorbital area.  Eye movements intact.  Eyes: EOM are normal.  Neck: Neck supple.    Pulmonary/Chest: Effort normal.  Abdominal: Soft. There is no tenderness.  Musculoskeletal: She exhibits no edema or tenderness.  Neurological: She is alert.  Skin: Skin is warm.  Psychiatric: She has a normal mood and affect.     ED Treatments / Results  Labs (all labs ordered are listed, but only abnormal results are displayed) Labs Reviewed - No data to display  EKG None  Radiology Ct Head Wo Contrast  Result Date: 08/30/2017 CLINICAL DATA:  67 year old female status post trip and fall at [employer] store. Head and face trauma. EXAM: CT HEAD WITHOUT CONTRAST CT MAXILLOFACIAL WITHOUT CONTRAST TECHNIQUE: Multidetector CT imaging of the head and maxillofacial structures were performed using the standard protocol without intravenous contrast. Multiplanar CT image reconstructions of the maxillofacial structures were also generated. COMPARISON:  Cervical spine radiographs 03/27/2013. Head CT without contrast 05/30/2012. FINDINGS: CT HEAD FINDINGS Brain: Cerebral volume is within normal limits for age. No midline shift, ventriculomegaly, mass effect, evidence of mass lesion, intracranial hemorrhage or evidence of cortically based acute infarction. Gray-white matter differentiation is within normal limits throughout the brain. Vascular: Calcified atherosclerosis at the skull base. No suspicious intracranial vascular hyperdensity. Skull: Stable and intact. Other: Midline forehead scalp hematoma measuring up to 7 mm in thickness. Other scalp soft tissues are within normal limits. CT MAXILLOFACIAL FINDINGS Osseous: Absent dentition. Intact mandible. No maxilla, zygoma, or nasal bone fracture. Central skull base intact. Advanced cervical spine degeneration with multilevel vacuum disc, bulky endplate osteophytes, and reversal of cervical lordosis. Orbits: Intact orbital walls. Postoperative changes to both globes but otherwise the bilateral orbits soft tissues appear normal. There is soft tissue  hematoma/contusion along the superior bridge of the nose tracking into the forehead, contiguous with that described above. Sinuses: Clear. The bilateral tympanic cavities and mastoids are also clear. Soft tissues: Negative visible noncontrast thyroid, larynx, oropharynx (postinflammatory calcification of the right palatine tonsil), parapharyngeal spaces, retropharyngeal space, sublingual space, submandibular spaces and parotid spaces. There is generalized enlargement of the adenoid soft tissue. Mild postinflammatory calcifications are noted. There is no discrete nasopharyngeal mass identified. The visible cervical lymph node stations are within normal limits. There is carotid bifurcation calcified atherosclerosis. IMPRESSION: 1. Forehead scalp hematoma without skull or facial fracture. 2.  Normal for age non contrast CT appearance of the brain. 3. Advanced cervical spine degeneration. Electronically Signed   By: Odessa FlemingH  Hall M.D.   On: 08/30/2017 21:36   Ct Maxillofacial Wo Contrast  Result Date: 08/30/2017 CLINICAL DATA:  67 year old female status post trip and fall at [employer] store. Head and face trauma. EXAM: CT HEAD WITHOUT CONTRAST CT MAXILLOFACIAL WITHOUT CONTRAST TECHNIQUE: Multidetector CT  imaging of the head and maxillofacial structures were performed using the standard protocol without intravenous contrast. Multiplanar CT image reconstructions of the maxillofacial structures were also generated. COMPARISON:  Cervical spine radiographs 03/27/2013. Head CT without contrast 05/30/2012. FINDINGS: CT HEAD FINDINGS Brain: Cerebral volume is within normal limits for age. No midline shift, ventriculomegaly, mass effect, evidence of mass lesion, intracranial hemorrhage or evidence of cortically based acute infarction. Gray-white matter differentiation is within normal limits throughout the brain. Vascular: Calcified atherosclerosis at the skull base. No suspicious intracranial vascular hyperdensity. Skull: Stable  and intact. Other: Midline forehead scalp hematoma measuring up to 7 mm in thickness. Other scalp soft tissues are within normal limits. CT MAXILLOFACIAL FINDINGS Osseous: Absent dentition. Intact mandible. No maxilla, zygoma, or nasal bone fracture. Central skull base intact. Advanced cervical spine degeneration with multilevel vacuum disc, bulky endplate osteophytes, and reversal of cervical lordosis. Orbits: Intact orbital walls. Postoperative changes to both globes but otherwise the bilateral orbits soft tissues appear normal. There is soft tissue hematoma/contusion along the superior bridge of the nose tracking into the forehead, contiguous with that described above. Sinuses: Clear. The bilateral tympanic cavities and mastoids are also clear. Soft tissues: Negative visible noncontrast thyroid, larynx, oropharynx (postinflammatory calcification of the right palatine tonsil), parapharyngeal spaces, retropharyngeal space, sublingual space, submandibular spaces and parotid spaces. There is generalized enlargement of the adenoid soft tissue. Mild postinflammatory calcifications are noted. There is no discrete nasopharyngeal mass identified. The visible cervical lymph node stations are within normal limits. There is carotid bifurcation calcified atherosclerosis. IMPRESSION: 1. Forehead scalp hematoma without skull or facial fracture. 2.  Normal for age non contrast CT appearance of the brain. 3. Advanced cervical spine degeneration. Electronically Signed   By: Odessa Fleming M.D.   On: 08/30/2017 21:36    Procedures Procedures (including critical care time)  Medications Ordered in ED Medications - No data to display   Initial Impression / Assessment and Plan / ED Course  I have reviewed the triage vital signs and the nursing notes.  Pertinent labs & imaging results that were available during my care of the patient were reviewed by me and considered in my medical decision making (see chart for details).      Patient with fall.  Hematoma to face and bridge of nose.  C-spine clinically cleared.  Had a maxillofacial CT is reassuring.  Will discharge patient home.  Final Clinical Impressions(s) / ED Diagnoses   Final diagnoses:  Fall, initial encounter  Contusion of face, initial encounter    ED Discharge Orders    None       Benjiman Core, MD 08/30/17 2211

## 2017-09-01 ENCOUNTER — Ambulatory Visit (HOSPITAL_COMMUNITY): Payer: Medicare HMO | Attending: Family Medicine | Admitting: Specialist

## 2017-09-01 ENCOUNTER — Encounter (HOSPITAL_COMMUNITY): Payer: Self-pay | Admitting: Specialist

## 2017-09-01 ENCOUNTER — Other Ambulatory Visit: Payer: Self-pay

## 2017-09-01 DIAGNOSIS — R29898 Other symptoms and signs involving the musculoskeletal system: Secondary | ICD-10-CM | POA: Diagnosis not present

## 2017-09-01 DIAGNOSIS — M25512 Pain in left shoulder: Secondary | ICD-10-CM | POA: Diagnosis not present

## 2017-09-01 DIAGNOSIS — M25612 Stiffness of left shoulder, not elsewhere classified: Secondary | ICD-10-CM | POA: Insufficient documentation

## 2017-09-01 NOTE — Patient Instructions (Signed)
Perform each exercise ___10-15_____ reps. 2-3x days.   Protraction - complete lying down Start by holding a wand or cane at chest height.  Next, slowly push the wand outwards in front of your body so that your elbows become fully straightened. Then, return to the original position.     Shoulder FLEXION -lying down - PALMS UP  In the standing position, hold a wand/cane with both arms, palms up on both sides. Raise up the wand/cane allowing your unaffected arm to perform most of the effort. Your affected arm should be partially relaxed.      I  Shoulder ABDUCTION - STANDING  While holding a wand/cane palm face up on the injured side and palm face down on the uninjured side, slowly raise up your injured arm to the side.

## 2017-09-01 NOTE — Therapy (Addendum)
[facility] [address] [place], [place], [address] Phone: [phone]   Fax:  [phone]  Occupational Therapy Evaluation  Patient Details  Name: Erin Pacheco MRN: [id number] Date of Birth: May 02, 1951 Referring Provider: Dr. Ocie Bob   Encounter Date: 09/01/2017  OT End of Session - 09/01/17 1205    Visit Number  1    Number of Visits  4    Date for OT Re-Evaluation  10/01/17    Authorization Type  aetna medicare     OT Start Time  [phone]    OT Stop Time  0950    OT Time Calculation (min)  40 min    Activity Tolerance  Patient tolerated treatment well    Behavior During Therapy  [facility] for tasks assessed/performed       Past Medical History:  Diagnosis Date  . Arthritis   . Heart murmur   . Hypertension     Past Surgical History:  Procedure Laterality Date  . ABDOMINAL HYSTERECTOMY    . BILATERAL OOPHORECTOMY    . CATARACT EXTRACTION W/PHACO  12/27/2010   Procedure: CATARACT EXTRACTION PHACO AND INTRAOCULAR LENS PLACEMENT (IOC);  Surgeon: Gemma Payor;  Location: [facility];  Service: Ophthalmology;  Laterality: Right;  . CATARACT EXTRACTION W/PHACO  03/28/2011   Procedure: CATARACT EXTRACTION PHACO AND INTRAOCULAR LENS PLACEMENT (IOC);  Surgeon: Gemma Payor;  Location: [facility];  Service: Ophthalmology;  Laterality: Left;  CDE 7.27    There were no vitals filed for this visit.  Subjective Assessment - 09/01/17 1159    Subjective   S:  I have been having this pain for quite some time.  I dont let it stop me I just work through the pain.     Pertinent History  Erin Pacheco reports experiencing pain in her left shoulder joint ongoing for several years.  A former MD would presribe different linaments for her pain, however, he has since retired.  She consulted with Dr. Jeanice Lim and has been referred to occupational therapy for evaluation and treatment.    Special Tests  FOTO 74.54    Patient Stated Goals  I want to get rid of this pain.      Currently in Pain?  Yes    Pain Score  8     Pain Location  Shoulder    Pain Orientation  Left    Pain Descriptors / Indicators  Aching    Pain Type  Acute pain    Pain Onset  More than a month ago    Pain Frequency  Constant    Aggravating Factors   certain movements, cold    Pain Relieving Factors  heat, keeping away from drafty air    Effect of Pain on Daily Activities  minimal - works through pain        [facility] OT Assessment - 09/01/17 0001      Assessment   Medical Diagnosis  Left Shoulder Pain    Referring Provider Vita Parke Simmers    Onset Date/Surgical Date  -- chronic    Hand Dominance  Right    Next MD Visit  unknown    Prior Therapy  n/a      Precautions   Precautions  None      Restrictions   Weight Bearing Restrictions  No      Balance Screen   Has the patient fallen in the past 6 months  Yes    How many times?  1 tripped on  furniture leg at store this week    Has the patient had a decrease in activity level because of a fear of falling?   No    Is the patient reluctant to leave their home because of a fear of falling?   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    Leisure  caring for her 2 great grandchildren, activities at church      ADL   ADL comments  Patient has difficulty reaching into overhead cabinets, reaching across her body to wash her right arm, lifting heavy items, such as jugs of water      Written Expression   Dominant Hand  Right      Vision - History   Baseline Vision  No visual deficits      Cognition   Overall Cognitive Status  Within Functional Limits for tasks assessed      Observation/Other Assessments   Focus on Therapeutic Outcomes (FOTO)   74.54      Sensation   Light Touch  Appears  Intact      Coordination   Gross Motor Movements are Fluid and Coordinated  Yes    Fine Motor Movements are Fluid and Coordinated  Yes      ROM / Strength   AROM / PROM / Strength  AROM;PROM;Strength      Palpation   Palpation comment  min-mod fascial restrictions and tightness in left upper arm, scapular, and shoulder region      AROM   Overall AROM Comments  assessed in seated, external rotation and internal rotation with shoulder adducted    AROM Assessment Site  Shoulder    Right/Left Shoulder  Left    Left Shoulder Flexion  130 Degrees    Left Shoulder ABduction  120 Degrees    Left Shoulder Internal Rotation  70 Degrees    Left Shoulder External Rotation  55 Degrees      PROM   Overall PROM Comments  PROM is WFL in supine      Strength   Overall Strength Comments  assessed in seated, external and internal rotation with shoudler adducted    Strength Assessment Site  Shoulder    Right/Left Shoulder  Left    Left Shoulder Flexion  4+/5    Left Shoulder ABduction  4+/5    Left Shoulder Internal Rotation  4+/5    Left Shoulder External Rotation  4+/5               OT Treatments/Exercises (OP) - 09/01/17 0001      Exercises   Exercises  Shoulder      Shoulder Exercises: Supine   Protraction  AAROM;5 reps    External Rotation  PROM;5 reps    Internal Rotation  PROM;5 reps    Flexion  PROM;AAROM;5 reps    ABduction  PROM;AAROM;5 reps      Manual Therapy   Manual Therapy  Myofascial release    Manual therapy comments  manual therapy completed seperately from all other interventions this date of service     Myofascial Release  myofascial release and manual stretching to left upper arm, scapular, and shoulderregion to  decrease pain and improve pain free mobility in left shoulder region             OT Education - 09/01/17 1205    Education provided  Yes    Education Details  reviewed aa/rom exercises in supine for flexion, abduction, protraction.       Person(s) Educated  Patient    Methods  Explanation;Demonstration;Handout    Comprehension  Verbalized understanding;Returned demonstration       OT Short Term Goals - 09/01/17 1237      OT SHORT TERM GOAL #1   Title  Patient will be educated on and independent with HEP for improved left shoulder range of motion and strength needed for ADL completion.     Time  4    Period  Weeks    Status  New    Target Date  10/01/17      OT SHORT TERM GOAL #2   Title  Patient will improve left shoulder A/ROM to WNL in order to improve ability to reach overhead and across her body without difficulty when completing ADL activities.     Time  4    Period  Weeks    Status  New      OT SHORT TERM GOAL #3   Title  Patient will improve left shoulder strength to 4+/5 for improved ability to lift jugs of water at the grocery store.     Time  4    Period  Weeks    Status  New      OT SHORT TERM GOAL #4   Title  Patient will improve left shoulder pain to 2/10 or less when reaching overhead or lifting heavy items.     Time  4    Period  Weeks    Status  New      OT SHORT TERM GOAL #5   Title  Patient will decrease fascial restrictions in her left shoulder region from min-mod to minimal for increased functional use of her left arm with daily activities.     Time  4    Period  Weeks    Status  New               Plan - 09/01/17 1207    Clinical Impression Statement  A:  Patient is a 66 year old female experiencing on going pain and decreased mobility in her left shoulder joint.  She has been experiencing the pain for several years, however, recently noted a raised swollen area on her lateral upper arm that concerned her.  Due to her left shoulder pain, she has increased difficulty using her left arm to reach overhead, out to the side, reaching across her body and lifting heavy items such as jugs of water.  Patient will benefit from skilled OT intervention to imrpove her left shoulder range of  motion and strength and decrease pain and restrictions.      Occupational Profile and client history currently impacting functional performance  very active, works full time, cares for great grandchildren, active at church, generally healthy with minimal medical problems.     Occupational performance deficits (Please refer to evaluation for details):  ADL's;IADL's;Work;Leisure;Social Participation    Rehab Potential  Good    Current Impairments/barriers affecting progress:  length of time dealing with pain without treatment     OT Frequency  1x / week    OT Duration  4 weeks    OT Treatment/Interventions  Self-care/ADL training;Therapeutic exercise;Manual Therapy;Neuromuscular education;Ultrasound;Energy  conservation;Therapeutic activities;Electrical Stimulation;Moist Heat;Contrast Bath;Passive range of motion;Patient/family education    Plan  P:  skilled ot intervention is indicated to decrease pain and restrictions and improve pain free mobility and strength in her left shoulder region in order to use her left arm normally with all daily, work, and leisure activities.      Clinical Decision Making  Limited treatment options, no task modification necessary    OT Home Exercise Plan  09/01/17 - aa/rom in supine    Consulted and Agree with Plan of Care  Patient       Patient will benefit from skilled therapeutic intervention in order to improve the following deficits and impairments:  Increased muscle spasms, Decreased strength, Decreased range of motion, Pain, Increased fascial restrictions, Impaired UE functional use  Visit Diagnosis: Acute pain of left shoulder  Stiffness of left shoulder, not elsewhere classified  Other symptoms and signs involving the musculoskeletal system    Problem List There are no active problems to display for this patient.   Shirlean Mylar, MHA, OTR/L [phone]  09/01/2017, 12:55 PM  [facility] [address] [place], [place], [address] Phone: [phone]   Fax:  [phone]  Name: TANISA LAGACE MRN: [id number] Date of Birth: 09/13/1950

## 2017-09-05 ENCOUNTER — Ambulatory Visit (HOSPITAL_COMMUNITY): Payer: Medicare HMO | Attending: Family Medicine

## 2017-09-05 ENCOUNTER — Other Ambulatory Visit: Payer: Self-pay

## 2017-09-05 ENCOUNTER — Encounter (HOSPITAL_COMMUNITY): Payer: Self-pay

## 2017-09-05 DIAGNOSIS — R29898 Other symptoms and signs involving the musculoskeletal system: Secondary | ICD-10-CM | POA: Diagnosis not present

## 2017-09-05 DIAGNOSIS — M25612 Stiffness of left shoulder, not elsewhere classified: Secondary | ICD-10-CM

## 2017-09-05 DIAGNOSIS — M25512 Pain in left shoulder: Secondary | ICD-10-CM | POA: Insufficient documentation

## 2017-09-05 NOTE — Patient Instructions (Signed)

## 2017-09-05 NOTE — Therapy (Signed)
[facility] [address] [place], [place], [address] Phone: [phone]   Fax:  [phone]  Occupational Therapy Treatment  Patient Details  Name: Erin Pacheco MRN: [id number] Date of Birth: Mar 27, 1951 Referring Provider: Dr. Kirtland Bouchard   Encounter Date: 09/05/2017  OT End of Session - 09/05/17 0904    Visit Number  2    Number of Visits  4    Date for OT Re-Evaluation  10/01/17    Authorization Type  aetna medicare     OT Start Time  0825    OT Stop Time  0903    OT Time Calculation (min)  38 min    Activity Tolerance  Patient tolerated treatment well    Behavior During Therapy  [facility] for tasks assessed/performed       Past Medical History:  Diagnosis Date  . Arthritis   . Heart murmur   . Hypertension     Past Surgical History:  Procedure Laterality Date  . ABDOMINAL HYSTERECTOMY    . BILATERAL OOPHORECTOMY    . CATARACT EXTRACTION W/PHACO  12/27/2010   Procedure: CATARACT EXTRACTION PHACO AND INTRAOCULAR LENS PLACEMENT (IOC);  Surgeon: Gemma Payor;  Location: [facility];  Service: Ophthalmology;  Laterality: Right;  . CATARACT EXTRACTION W/PHACO  03/28/2011   Procedure: CATARACT EXTRACTION PHACO AND INTRAOCULAR LENS PLACEMENT (IOC);  Surgeon: Gemma Payor;  Location: [facility];  Service: Ophthalmology;  Laterality: Left;  CDE 7.27    There were no vitals filed for this visit.  Subjective Assessment - 09/05/17 0859    Subjective   S: I didn't get a chance to do my exercises.     Currently in Pain?  No/denies         [facility] OT Assessment - 09/05/17 0829      Assessment   Medical Diagnosis  Left Shoulder Pain      Precautions   Precautions  None               OT Treatments/Exercises (OP) - 09/05/17 0829      Exercises   Exercises  Shoulder      Shoulder Exercises: Supine   Protraction  PROM;5 reps;AROM;12 reps    Horizontal ABduction  PROM;5 reps;AROM;12 reps    External Rotation  PROM;5 reps;AROM;12  reps    Internal Rotation  PROM;5 reps;AROM;12 reps    Flexion  PROM;5 reps;AROM;12 reps    ABduction  PROM;5 reps;AROM;12 reps      Shoulder Exercises: Standing   Protraction  AROM;12 reps    Horizontal ABduction  AROM;12 reps    External Rotation  AROM;12 reps    Internal Rotation  AROM;12 reps    Flexion  AROM;12 reps    ABduction  AROM;12 reps    Extension  Theraband;10 reps    Theraband Level (Shoulder Extension)  Level 2 (Red)    Row  Theraband;10 reps    Theraband Level (Shoulder Row)  Level 2 (Red)    Retraction  Theraband;10 reps    Theraband Level (Shoulder Retraction)  Level 2 (Red)      Shoulder Exercises: ROM/Strengthening   UBE (Upper Arm Bike)  Level 1 2' forward 2' reverese pace: 3.5-4.0      Manual Therapy   Manual Therapy  Myofascial release    Manual therapy comments  manual therapy completed seperately from all other interventions this date of service     Myofascial Release  myofascial release and manual stretching to left upper arm,  scapular, and shoulderregion to decrease pain and improve pain free mobility in left shoulder region              OT Education - 09/05/17 0859    Education provided  Yes    Education Details  scapular theraband with red. Pt is to stop previous exercises. Pt given OT evaluation. Reviewed goals.    Person(s) Educated  Patient    Methods  Explanation;Demonstration;Handout;Verbal cues;Tactile cues    Comprehension  Returned demonstration;Verbalized understanding       OT Short Term Goals - 09/05/17 0905      OT SHORT TERM GOAL #1   Title  Patient will be educated on and independent with HEP for improved left shoulder range of motion and strength needed for ADL completion.     Time  4    Period  Weeks    Status  On-going      OT SHORT TERM GOAL #2   Title  Patient will improve left shoulder A/ROM to WNL in order to improve ability to reach overhead and across her body without difficulty when completing ADL activities.      Time  4    Period  Weeks    Status  On-going      OT SHORT TERM GOAL #3   Title  Patient will improve left shoulder strength to 4+/5 for improved ability to lift jugs of water at the grocery store.     Time  4    Period  Weeks    Status  On-going      OT SHORT TERM GOAL #4   Title  Patient will improve left shoulder pain to 2/10 or less when reaching overhead or lifting heavy items.     Time  4    Period  Weeks    Status  On-going      OT SHORT TERM GOAL #5   Title  Patient will decrease fascial restrictions in her left shoulder region from min-mod to minimal for increased functional use of her left arm with daily activities.     Time  4    Period  Weeks    Status  On-going               Plan - 09/05/17 0904    Clinical Impression Statement  A: Initiated myofascial release, manual stretching, A/ROM and scapular strengthening. patient is presenting with zero pain and greater ROM since initial evaluation. HEP was updated. VC were needed for form and technique.     Plan  P: Progress to 1# handweight supine and standing. Add proximal shoulder strengthening.     Consulted and Agree with Plan of Care  Patient       Patient will benefit from skilled therapeutic intervention in order to improve the following deficits and impairments:  Increased muscle spasms, Decreased strength, Decreased range of motion, Pain, Increased fascial restrictions, Impaired UE functional use  Visit Diagnosis: Acute pain of left shoulder  Stiffness of left shoulder, not elsewhere classified  Other symptoms and signs involving the musculoskeletal system    Problem List There are no active problems to display for this patient.  Limmie PatriciaLaura Essenmacher, OTR/L,CBIS  [phone]  09/05/2017, 9:08 AM   [facility] [address] [place], [place], [address] Phone: [phone]   Fax:  [phone]  Name: Erin Pacheco MRN: [id number] Date of Birth:  12/13/1950

## 2017-09-08 NOTE — Addendum Note (Signed)
Addended by: Shirlean MylarMURRAY, Tijuan Dantes H on: 09/08/2017 08:28 AM   Modules accepted: Orders

## 2017-09-15 ENCOUNTER — Encounter (HOSPITAL_COMMUNITY): Payer: Self-pay

## 2017-09-15 ENCOUNTER — Other Ambulatory Visit: Payer: Self-pay

## 2017-09-15 ENCOUNTER — Ambulatory Visit (HOSPITAL_COMMUNITY): Payer: Medicare HMO

## 2017-09-15 DIAGNOSIS — M25512 Pain in left shoulder: Secondary | ICD-10-CM

## 2017-09-15 DIAGNOSIS — M25612 Stiffness of left shoulder, not elsewhere classified: Secondary | ICD-10-CM | POA: Diagnosis not present

## 2017-09-15 DIAGNOSIS — R29898 Other symptoms and signs involving the musculoskeletal system: Secondary | ICD-10-CM

## 2017-09-15 NOTE — Therapy (Signed)
[facility] [address] [place], [place], [address] Phone: [phone]   Fax:  [phone]  Occupational Therapy Treatment  Patient Details  Name: Erin Pacheco MRN: [id number] Date of Birth: September 20, 1950 Referring Provider: Dr. Ocie Bob    Encounter Date: 09/15/2017  OT End of Session - 09/15/17 0921    Visit Number  3    Number of Visits  4    Date for OT Re-Evaluation  10/01/17    Authorization Type  aetna medicare     OT Start Time  [phone]    OT Stop Time  0945    OT Time Calculation (min)  40 min    Activity Tolerance  Patient tolerated treatment well    Behavior During Therapy  [facility] for tasks assessed/performed       Past Medical History:  Diagnosis Date  . Arthritis   . Heart murmur   . Hypertension     Past Surgical History:  Procedure Laterality Date  . ABDOMINAL HYSTERECTOMY    . BILATERAL OOPHORECTOMY    . CATARACT EXTRACTION W/PHACO  12/27/2010   Procedure: CATARACT EXTRACTION PHACO AND INTRAOCULAR LENS PLACEMENT (IOC);  Surgeon: Gemma Payor;  Location: [facility];  Service: Ophthalmology;  Laterality: Right;  . CATARACT EXTRACTION W/PHACO  03/28/2011   Procedure: CATARACT EXTRACTION PHACO AND INTRAOCULAR LENS PLACEMENT (IOC);  Surgeon: Gemma Payor;  Location: [facility];  Service: Ophthalmology;  Laterality: Left;  CDE 7.27    There were no vitals filed for this visit.  Subjective Assessment - 09/15/17 0907    Subjective   S: I did try the new exercises. I even added some extra repetitions.     Currently in Pain?  Yes    Pain Score  3     Pain Location  Shoulder    Pain Orientation  Left    Pain Descriptors / Indicators  Aching    Pain Type  Acute pain    Pain Radiating Towards  N/A    Pain Onset  Today    Pain Frequency  Constant    Aggravating Factors   Too early to tell    Pain Relieving Factors  Unknown    Effect of Pain on Daily Activities  Minimal          [facility] OT Assessment - 09/15/17 0922       Assessment   Medical Diagnosis  Left Shoulder Pain      Precautions   Precautions  None               OT Treatments/Exercises (OP) - 09/15/17 0922      Exercises   Exercises  Shoulder      Shoulder Exercises: Supine   Protraction  PROM;5 reps;Strengthening;12 reps    Protraction Weight (lbs)  1    Horizontal ABduction  PROM;5 reps;Strengthening;12 reps    Horizontal ABduction Weight (lbs)  1    External Rotation  PROM;5 reps;Strengthening;12 reps    External Rotation Weight (lbs)  1    Internal Rotation  PROM;5 reps;Strengthening;12 reps    Internal Rotation Weight (lbs)  1    Flexion  PROM;5 reps;Strengthening;12 reps    Shoulder Flexion Weight (lbs)  1    ABduction  PROM;5 reps;Strengthening;12 reps    Shoulder ABduction Weight (lbs)  1      Shoulder Exercises: Standing   Protraction  Strengthening;12 reps    Protraction Weight (lbs)  1    Horizontal ABduction  Strengthening;12  reps    Horizontal ABduction Weight (lbs)  1    External Rotation  Strengthening;12 reps    External Rotation Weight (lbs)  1    Internal Rotation  Strengthening;12 reps    Internal Rotation Weight (lbs)  1    Flexion  Strengthening;12 reps    Shoulder Flexion Weight (lbs)  1    ABduction  Strengthening;12 reps    Shoulder ABduction Weight (lbs)  1      Shoulder Exercises: ROM/Strengthening   UBE (Upper Arm Bike)  Level 1 2' forward 2' reverese pace: 3.5-4.0    X to V Arms  10X    Proximal Shoulder Strengthening, Supine  12X with 1# visual and verbal cues    Proximal Shoulder Strengthening, Seated  10X 1# rest breaks      Manual Therapy   Manual Therapy  Myofascial release    Manual therapy comments  manual therapy completed seperately from all other interventions this date of service     Myofascial Release  myofascial release and manual stretching to left upper arm, scapular, and shoulderregion to decrease pain and improve pain free mobility in left shoulder region                 OT Short Term Goals - 09/05/17 0905      OT SHORT TERM GOAL #1   Title  Patient will be educated on and independent with HEP for improved left shoulder range of motion and strength needed for ADL completion.     Time  4    Period  Weeks    Status  On-going      OT SHORT TERM GOAL #2   Title  Patient will improve left shoulder A/ROM to WNL in order to improve ability to reach overhead and across her body without difficulty when completing ADL activities.     Time  4    Period  Weeks    Status  On-going      OT SHORT TERM GOAL #3   Title  Patient will improve left shoulder strength to 4+/5 for improved ability to lift jugs of water at the grocery store.     Time  4    Period  Weeks    Status  On-going      OT SHORT TERM GOAL #4   Title  Patient will improve left shoulder pain to 2/10 or less when reaching overhead or lifting heavy items.     Time  4    Period  Weeks    Status  On-going      OT SHORT TERM GOAL #5   Title  Patient will decrease fascial restrictions in her left shoulder region from min-mod to minimal for increased functional use of her left arm with daily activities.     Time  4    Period  Weeks    Status  On-going               Plan - 09/15/17 2039    Clinical Impression Statement  A: Pt was able to progress to 1# hand weights supine and standing this session, added proximal shoulder strengthening with max VC for form and technique.     OT Treatment/Interventions  Self-care/ADL training;Therapeutic exercise;Manual Therapy;Neuromuscular education;Ultrasound;Energy conservation;Therapeutic activities;Electrical Stimulation;Moist Heat;Contrast Bath;Passive range of motion;Patient/family education    Plan  P: Continue with myofascial release to increase joint mobility and decrease pain level in LUE. Add overhead lacing.     Consulted and Agree with  Plan of Care  Patient       Patient will benefit from skilled therapeutic intervention in  order to improve the following deficits and impairments:  Increased muscle spasms, Decreased strength, Decreased range of motion, Pain, Increased fascial restrictions, Impaired UE functional use  Visit Diagnosis: Acute pain of left shoulder  Stiffness of left shoulder, not elsewhere classified  Other symptoms and signs involving the musculoskeletal system    Problem List There are no active problems to display for this patient.  Limmie PatriciaLaura Dura Mccormack, OTR/L,CBIS  [phone]  09/15/2017, 8:44 PM  [facility] [address] [place], [place], [address] Phone: [phone]   Fax:  [phone]  Name: Erin Pacheco MRN: [id number] Date of Birth: 11/17/1950

## 2017-09-22 ENCOUNTER — Ambulatory Visit (HOSPITAL_COMMUNITY): Payer: Medicare HMO | Admitting: Specialist

## 2017-09-22 ENCOUNTER — Encounter (HOSPITAL_COMMUNITY): Payer: Self-pay | Admitting: Specialist

## 2017-09-22 DIAGNOSIS — M25512 Pain in left shoulder: Secondary | ICD-10-CM

## 2017-09-22 DIAGNOSIS — M25612 Stiffness of left shoulder, not elsewhere classified: Secondary | ICD-10-CM | POA: Diagnosis not present

## 2017-09-22 DIAGNOSIS — R29898 Other symptoms and signs involving the musculoskeletal system: Secondary | ICD-10-CM | POA: Diagnosis not present

## 2017-09-22 NOTE — Therapy (Signed)
[facility] [address] [place], [place], [address] Phone: [phone]   Fax:  [phone]  Occupational Therapy Treatment  Patient Details  Name: Erin Pacheco MRN: [id number] Date of Birth: 04/18/1951 Referring Provider: Dr. Ocie Bob    Encounter Date: 09/22/2017  OT End of Session - 09/22/17 1058    Visit Number  4    Number of Visits  5    Date for OT Re-Evaluation  10/01/17    Authorization Type  aetna medicare     OT Start Time  [phone]    OT Stop Time  0950    OT Time Calculation (min)  40 min    Activity Tolerance  Patient tolerated treatment well    Behavior During Therapy  [facility] for tasks assessed/performed       Past Medical History:  Diagnosis Date  . Arthritis   . Heart murmur   . Hypertension     Past Surgical History:  Procedure Laterality Date  . ABDOMINAL HYSTERECTOMY    . BILATERAL OOPHORECTOMY    . CATARACT EXTRACTION W/PHACO  12/27/2010   Procedure: CATARACT EXTRACTION PHACO AND INTRAOCULAR LENS PLACEMENT (IOC);  Surgeon: Gemma Payor;  Location: [facility];  Service: Ophthalmology;  Laterality: Right;  . CATARACT EXTRACTION W/PHACO  03/28/2011   Procedure: CATARACT EXTRACTION PHACO AND INTRAOCULAR LENS PLACEMENT (IOC);  Surgeon: Gemma Payor;  Location: [facility];  Service: Ophthalmology;  Laterality: Left;  CDE 7.27    There were no vitals filed for this visit.  Subjective Assessment - 09/22/17 0927    Subjective   S:  The air conditioning makes my shoulder sore.     Currently in Pain?  Yes    Pain Score  2     Pain Location  Shoulder    Pain Orientation  Left    Pain Descriptors / Indicators  Aching    Pain Type  Acute pain         [facility] OT Assessment - 09/22/17 0001      Assessment   Medical Diagnosis  Left Shoulder Pain      Precautions   Precautions  None      Restrictions   Weight Bearing Restrictions  No               OT Treatments/Exercises (OP) - 09/22/17 0001      Exercises    Exercises  Shoulder      Shoulder Exercises: Supine   Protraction  PROM;5 reps;Strengthening;15 reps    Protraction Weight (lbs)  1    Horizontal ABduction  PROM;5 reps;Strengthening;15 reps    Horizontal ABduction Weight (lbs)  1    External Rotation  PROM;5 reps;Strengthening;15 reps    External Rotation Weight (lbs)  1    Internal Rotation  PROM;5 reps;Strengthening;15 reps    Internal Rotation Weight (lbs)  1    Flexion  PROM;5 reps;Strengthening;15 reps    Shoulder Flexion Weight (lbs)  1    ABduction  PROM;5 reps;Strengthening;15 reps    Shoulder ABduction Weight (lbs)  1      Shoulder Exercises: Standing   Protraction  Strengthening;15 reps    Protraction Weight (lbs)  1    Horizontal ABduction  Strengthening;15 reps    Horizontal ABduction Weight (lbs)  1    External Rotation  Strengthening;15 reps    External Rotation Weight (lbs)  1    Internal Rotation  Strengthening;15 reps    Internal Rotation Weight (lbs)  1  Flexion  Strengthening;15 reps    Shoulder Flexion Weight (lbs)  1    ABduction  Strengthening;15 reps    Shoulder ABduction Weight (lbs)  1    Extension  Theraband;15 reps    Theraband Level (Shoulder Extension)  Level 2 (Red)    Row  Theraband;15 reps    Theraband Level (Shoulder Row)  Level 2 (Red)    Retraction  Theraband;15 reps    Theraband Level (Shoulder Retraction)  Level 2 (Red)      Shoulder Exercises: ROM/Strengthening   UBE (Upper Arm Bike)  level 1 3' in reverse for improved scapular stability and strength    Proximal Shoulder Strengthening, Supine  12X with 1# visual and verbal cues    Proximal Shoulder Strengthening, Seated  10X 1#  with no rest breaks      Manual Therapy   Manual Therapy  Myofascial release    Manual therapy comments  manual therapy completed seperately from all other interventions this date of service     Myofascial Release  myofascial release and manual stretching to left upper arm, scapular, and shoulderregion to  decrease pain and improve pain free mobility in left shoulder region                OT Short Term Goals - 09/05/17 0905      OT SHORT TERM GOAL #1   Title  Patient will be educated on and independent with HEP for improved left shoulder range of motion and strength needed for ADL completion.     Time  4    Period  Weeks    Status  On-going      OT SHORT TERM GOAL #2   Title  Patient will improve left shoulder A/ROM to WNL in order to improve ability to reach overhead and across her body without difficulty when completing ADL activities.     Time  4    Period  Weeks    Status  On-going      OT SHORT TERM GOAL #3   Title  Patient will improve left shoulder strength to 4+/5 for improved ability to lift jugs of water at the grocery store.     Time  4    Period  Weeks    Status  On-going      OT SHORT TERM GOAL #4   Title  Patient will improve left shoulder pain to 2/10 or less when reaching overhead or lifting heavy items.     Time  4    Period  Weeks    Status  On-going      OT SHORT TERM GOAL #5   Title  Patient will decrease fascial restrictions in her left shoulder region from min-mod to minimal for increased functional use of her left arm with daily activities.     Time  4    Period  Weeks    Status  On-going               Plan - 09/22/17 1058    Clinical Impression Statement  A:  increased repetitions with strengthening exercises this date and able to complete scapular stability exercises without rest breaks.  minimal restrictions noted in left shoulder this date.      Plan  P:  Reassess for possible dc, dc manual therapy to PRN for pain control, increasd to 2# in supine for strengthening.         Patient will benefit from skilled therapeutic intervention in order to  improve the following deficits and impairments:  Increased muscle spasms, Decreased strength, Decreased range of motion, Pain, Increased fascial restrictions, Impaired UE functional  use  Visit Diagnosis: Acute pain of left shoulder  Stiffness of left shoulder, not elsewhere classified  Other symptoms and signs involving the musculoskeletal system    Problem List There are no active problems to display for this patient.   Shirlean Mylar, MHA, OTR/L [phone]  09/22/2017, 11:02 AM  [facility] [address] [place], [place], [address] Phone: [phone]   Fax:  [phone]  Name: MARZELLE RUTTEN MRN: [id number] Date of Birth: [birth year]

## 2017-09-29 ENCOUNTER — Ambulatory Visit (HOSPITAL_COMMUNITY): Payer: Medicare HMO | Admitting: Specialist

## 2017-09-29 ENCOUNTER — Encounter (HOSPITAL_COMMUNITY): Payer: Self-pay | Admitting: Specialist

## 2017-09-29 DIAGNOSIS — M75102 Unspecified rotator cuff tear or rupture of left shoulder, not specified as traumatic: Secondary | ICD-10-CM | POA: Diagnosis not present

## 2017-09-29 DIAGNOSIS — M25612 Stiffness of left shoulder, not elsewhere classified: Secondary | ICD-10-CM | POA: Diagnosis not present

## 2017-09-29 DIAGNOSIS — R7301 Impaired fasting glucose: Secondary | ICD-10-CM | POA: Diagnosis not present

## 2017-09-29 DIAGNOSIS — R29898 Other symptoms and signs involving the musculoskeletal system: Secondary | ICD-10-CM

## 2017-09-29 DIAGNOSIS — E785 Hyperlipidemia, unspecified: Secondary | ICD-10-CM | POA: Diagnosis not present

## 2017-09-29 DIAGNOSIS — M25512 Pain in left shoulder: Secondary | ICD-10-CM | POA: Diagnosis not present

## 2017-09-29 DIAGNOSIS — E7800 Pure hypercholesterolemia, unspecified: Secondary | ICD-10-CM | POA: Diagnosis not present

## 2017-09-29 DIAGNOSIS — M1990 Unspecified osteoarthritis, unspecified site: Secondary | ICD-10-CM | POA: Diagnosis not present

## 2017-09-29 DIAGNOSIS — I10 Essential (primary) hypertension: Secondary | ICD-10-CM | POA: Diagnosis not present

## 2017-09-29 NOTE — Therapy (Signed)
[place], [place], [address] Phone: [phone]   Fax:  [phone]  Occupational Therapy Treatment  Patient Details  Name: Erin Pacheco MRN: [id number] Date of Birth: [birth year] Referring Provider: Dr. Darlyne Russian    Encounter Date: 09/29/2017  OT End of Session - 09/29/17 1045    Visit Number  5    Number of Visits  5    Date for OT Re-Evaluation  10/01/17    Authorization Type  aetna medicare     OT Start Time  0915    OT Stop Time  0945    OT Time Calculation (min)  30 min    Activity Tolerance  Patient tolerated treatment well    Behavior During Therapy  [facility] for tasks assessed/performed       Past Medical History:  Diagnosis Date  . Arthritis   . Heart murmur   . Hypertension     Past Surgical History:  Procedure Laterality Date  . ABDOMINAL HYSTERECTOMY    . BILATERAL OOPHORECTOMY    . CATARACT EXTRACTION W/PHACO  12/27/2010   Procedure: CATARACT EXTRACTION PHACO AND INTRAOCULAR LENS PLACEMENT (IOC);  Surgeon: Tonny Branch;  Location: [facility];  Service: Ophthalmology;  Laterality: Right;  . CATARACT EXTRACTION W/PHACO  03/28/2011   Procedure: CATARACT EXTRACTION PHACO AND INTRAOCULAR LENS PLACEMENT (IOC);  Surgeon: Tonny Branch;  Location: [facility];  Service: Ophthalmology;  Laterality: Left;  CDE 7.27    There were no vitals filed for this visit.  Subjective Assessment - 09/29/17 1044    Subjective   S:  I am doing much better.  I can lift water jugs and mop the floor without any problem.     Currently in Pain?  No/denies    Pain Score  0-No pain         [facility] OT Assessment - 09/29/17 0001      Assessment   Medical Diagnosis  Left Shoulder Pain      Precautions   Precautions  None      Observation/Other Assessments   Focus on Therapeutic Outcomes (FOTO)   FOTO 86.64      Palpation   Palpation comment  minimal fascial restrictions       AROM   Overall AROM Comments  assessed in  seated, external rotation and internal rotation with shoulder adducted    Left Shoulder Flexion  142 Degrees 130    Left Shoulder ABduction  135 Degrees 120    Left Shoulder Internal Rotation  90 Degrees 70    Left Shoulder External Rotation  78 Degrees 55      Strength   Left Shoulder Flexion  5/5 4+/5    Left Shoulder ABduction  5/5 4+/5    Left Shoulder Internal Rotation  5/5 4+/5    Left Shoulder External Rotation  5/5 4+/5               OT Treatments/Exercises (OP) - 09/29/17 0001      Shoulder Exercises: Supine   Protraction  PROM;5 reps    Horizontal ABduction  PROM;5 reps    External Rotation  PROM;5 reps    Internal Rotation  PROM;5 reps    Flexion  PROM;5 reps    ABduction  PROM;5 reps      Shoulder Exercises: Standing   External Rotation  Theraband;10 reps    Theraband Level (Shoulder External Rotation)  Level 2 (Red)    Internal Rotation  Theraband;10 reps  Theraband Level (Shoulder Internal Rotation)  Level 2 (Red)      Manual Therapy   Manual Therapy  Passive ROM    Passive ROM  shoulder all ranges in supine              OT Education - 09/29/17 1044    Education provided  Yes    Education Details  added external and internal rotation with tband to hep this date.    Person(s) Educated  Patient    Methods  Explanation;Demonstration;Handout;Verbal cues    Comprehension  Verbalized understanding;Returned demonstration       OT Short Term Goals - 09/29/17 0929      OT SHORT TERM GOAL #1   Title  Patient will be educated on and independent with HEP for improved left shoulder range of motion and strength needed for ADL completion.     Time  4    Period  Weeks    Status  Achieved      OT SHORT TERM GOAL #2   Title  Patient will improve left shoulder A/ROM to WNL in order to improve ability to reach overhead and across her body without difficulty when completing ADL activities.     Time  4    Period  Weeks    Status  Achieved      OT  SHORT TERM GOAL #3   Title  Patient will improve left shoulder strength to 4+/5 for improved ability to lift jugs of water at the grocery store.     Time  4    Period  Weeks    Status  Achieved      OT SHORT TERM GOAL #4   Title  Patient will improve left shoulder pain to 2/10 or less when reaching overhead or lifting heavy items.     Time  4    Period  Weeks    Status  Partially Met      OT SHORT TERM GOAL #5   Title  Patient will decrease fascial restrictions in her left shoulder region from min-mod to minimal for increased functional use of her left arm with daily activities.     Time  4    Period  Weeks    Status  Achieved               Plan - 09/29/17 1045    Clinical Impression Statement  A:  Patient has met or partially met all OT goals.  She has improved ease and independence with all B/IADLs and work tasks.  She is now able to lift water jugs, reach overehead, and mop and wax her floor without difficulty.     Plan  P:  DC from skilled OT intervention this date as all goals have been met and patient is pleased with current functional level.     Consulted and Agree with Plan of Care  Patient       Patient will benefit from skilled therapeutic intervention in order to improve the following deficits and impairments:  Increased muscle spasms, Decreased strength, Decreased range of motion, Pain, Increased fascial restrictions, Impaired UE functional use  Visit Diagnosis: Acute pain of left shoulder  Stiffness of left shoulder, not elsewhere classified  Other symptoms and signs involving the musculoskeletal system    Problem List There are no active problems to display for this patient.   Vangie Bicker, MHA, OTR/L [phone] OCCUPATIONAL THERAPY DISCHARGE SUMMARY  Visits from Start of Care: 5  Current functional  level related to goals / functional outcomes: Able to complete all desired BADLs and IADLs    Remaining deficits: occassional pain, not  present this date.    Education / Equipment: Proximal shoulder strengthening with theraband.   Plan: Patient agrees to discharge.  Patient goals were met. Patient is being discharged due to meeting the stated rehab goals.  ?????       Vangie Bicker, [place], OTR/L [phone]     09/29/2017, 10:50 AM  [place], [place], [address] Phone: [phone]   Fax:  [phone]  Name: Erin Pacheco MRN: [id number] Date of Birth: July 06, 1950

## 2017-09-29 NOTE — Patient Instructions (Signed)
Strengthening: Resisted Internal Rotation    Hold tubing in left hand, elbow at side and forearm out. Rotate forearm in across body. Repeat ____ times per set. Do ____ sets per session. Do ____ sessions per day.  http://orth.exer.us/831   Copyright  VHI. All rights reserved.  Strengthening: Resisted External Rotation    Hold tubing in right hand, elbow at side and forearm across body. Rotate forearm out. Repeat ____ times per set. Do ____ sets per session. Do ____ sessions per day.  http://orth.exer.us/829   Copyright  VHI. All rights reserved.

## 2017-10-12 ENCOUNTER — Other Ambulatory Visit (HOSPITAL_COMMUNITY): Payer: Self-pay | Admitting: Family Medicine

## 2017-10-12 DIAGNOSIS — N6320 Unspecified lump in the left breast, unspecified quadrant: Secondary | ICD-10-CM

## 2017-10-31 ENCOUNTER — Encounter (HOSPITAL_COMMUNITY): Payer: Self-pay

## 2017-10-31 ENCOUNTER — Ambulatory Visit (HOSPITAL_COMMUNITY)
Admission: RE | Admit: 2017-10-31 | Discharge: 2017-10-31 | Disposition: A | Discharge status: Home / Self Care | Payer: Medicare HMO | Source: Ambulatory Visit | Attending: Family Medicine | Admitting: Family Medicine

## 2017-10-31 DIAGNOSIS — N6320 Unspecified lump in the left breast, unspecified quadrant: Secondary | ICD-10-CM | POA: Insufficient documentation

## 2017-10-31 DIAGNOSIS — R928 Other abnormal and inconclusive findings on diagnostic imaging of breast: Secondary | ICD-10-CM | POA: Diagnosis not present

## 2018-01-26 DIAGNOSIS — R7309 Other abnormal glucose: Secondary | ICD-10-CM | POA: Diagnosis not present

## 2018-01-26 DIAGNOSIS — E7800 Pure hypercholesterolemia, unspecified: Secondary | ICD-10-CM | POA: Diagnosis not present

## 2018-01-26 DIAGNOSIS — Z0000 Encounter for general adult medical examination without abnormal findings: Secondary | ICD-10-CM | POA: Diagnosis not present

## 2018-01-26 DIAGNOSIS — R7301 Impaired fasting glucose: Secondary | ICD-10-CM | POA: Diagnosis not present

## 2018-01-26 DIAGNOSIS — M1990 Unspecified osteoarthritis, unspecified site: Secondary | ICD-10-CM | POA: Diagnosis not present

## 2018-01-26 DIAGNOSIS — I10 Essential (primary) hypertension: Secondary | ICD-10-CM | POA: Diagnosis not present

## 2018-01-26 DIAGNOSIS — Z6825 Body mass index (BMI) 25.0-25.9, adult: Secondary | ICD-10-CM | POA: Diagnosis not present

## 2018-05-18 DIAGNOSIS — Z6825 Body mass index (BMI) 25.0-25.9, adult: Secondary | ICD-10-CM | POA: Diagnosis not present

## 2018-05-18 DIAGNOSIS — M1990 Unspecified osteoarthritis, unspecified site: Secondary | ICD-10-CM | POA: Diagnosis not present

## 2018-05-18 DIAGNOSIS — E785 Hyperlipidemia, unspecified: Secondary | ICD-10-CM | POA: Diagnosis not present

## 2018-05-18 DIAGNOSIS — I10 Essential (primary) hypertension: Secondary | ICD-10-CM | POA: Diagnosis not present

## 2018-09-21 DIAGNOSIS — I10 Essential (primary) hypertension: Secondary | ICD-10-CM | POA: Diagnosis not present

## 2018-09-21 DIAGNOSIS — R7301 Impaired fasting glucose: Secondary | ICD-10-CM | POA: Diagnosis not present

## 2018-09-21 DIAGNOSIS — M1990 Unspecified osteoarthritis, unspecified site: Secondary | ICD-10-CM | POA: Diagnosis not present

## 2018-10-05 DIAGNOSIS — I10 Essential (primary) hypertension: Secondary | ICD-10-CM | POA: Diagnosis not present

## 2018-11-02 DIAGNOSIS — I10 Essential (primary) hypertension: Secondary | ICD-10-CM | POA: Diagnosis not present

## 2018-11-02 DIAGNOSIS — M1990 Unspecified osteoarthritis, unspecified site: Secondary | ICD-10-CM | POA: Diagnosis not present

## 2018-11-02 DIAGNOSIS — Z7189 Other specified counseling: Secondary | ICD-10-CM | POA: Diagnosis not present

## 2018-11-02 DIAGNOSIS — R69 Illness, unspecified: Secondary | ICD-10-CM | POA: Diagnosis not present

## 2018-11-06 ENCOUNTER — Other Ambulatory Visit (HOSPITAL_COMMUNITY): Payer: Self-pay | Admitting: Family Medicine

## 2018-11-07 ENCOUNTER — Other Ambulatory Visit (HOSPITAL_COMMUNITY): Payer: Self-pay | Admitting: Family Medicine

## 2018-11-07 DIAGNOSIS — N6489 Other specified disorders of breast: Secondary | ICD-10-CM

## 2018-11-20 ENCOUNTER — Ambulatory Visit (HOSPITAL_COMMUNITY)
Admission: RE | Admit: 2018-11-20 | Discharge: 2018-11-20 | Disposition: A | Discharge status: Home / Self Care | Payer: Medicare HMO | Source: Ambulatory Visit | Attending: Family Medicine | Admitting: Family Medicine

## 2018-11-20 ENCOUNTER — Ambulatory Visit (HOSPITAL_COMMUNITY): Payer: Medicare HMO

## 2018-11-20 ENCOUNTER — Ambulatory Visit (HOSPITAL_COMMUNITY): Admission: RE | Admit: 2018-11-20 | Payer: Medicare HMO | Source: Ambulatory Visit

## 2018-11-20 ENCOUNTER — Other Ambulatory Visit: Payer: Self-pay

## 2018-11-20 DIAGNOSIS — N6489 Other specified disorders of breast: Secondary | ICD-10-CM

## 2018-11-20 DIAGNOSIS — R922 Inconclusive mammogram: Secondary | ICD-10-CM | POA: Diagnosis not present

## 2019-01-26 DIAGNOSIS — Z20828 Contact with and (suspected) exposure to other viral communicable diseases: Secondary | ICD-10-CM | POA: Diagnosis not present

## 2019-02-01 DIAGNOSIS — I10 Essential (primary) hypertension: Secondary | ICD-10-CM | POA: Diagnosis not present

## 2019-02-21 ENCOUNTER — Other Ambulatory Visit: Payer: Self-pay

## 2019-02-21 DIAGNOSIS — R6889 Other general symptoms and signs: Secondary | ICD-10-CM | POA: Diagnosis not present

## 2019-02-21 DIAGNOSIS — Z20822 Contact with and (suspected) exposure to covid-19: Secondary | ICD-10-CM

## 2019-02-23 LAB — NOVEL CORONAVIRUS, NAA: SARS-CoV-2, NAA: NOT DETECTED

## 2019-03-22 DIAGNOSIS — M130 Polyarthritis, unspecified: Secondary | ICD-10-CM | POA: Diagnosis not present

## 2019-03-25 ENCOUNTER — Other Ambulatory Visit: Payer: Self-pay

## 2019-03-25 ENCOUNTER — Ambulatory Visit (HOSPITAL_COMMUNITY)
Admission: RE | Admit: 2019-03-25 | Discharge: 2019-03-25 | Disposition: A | Discharge status: Home / Self Care | Payer: Medicare HMO | Source: Ambulatory Visit | Attending: Family Medicine | Admitting: Family Medicine

## 2019-03-25 ENCOUNTER — Other Ambulatory Visit (HOSPITAL_COMMUNITY): Payer: Self-pay | Admitting: Family Medicine

## 2019-03-25 DIAGNOSIS — M25561 Pain in right knee: Secondary | ICD-10-CM

## 2019-03-25 DIAGNOSIS — M25552 Pain in left hip: Secondary | ICD-10-CM | POA: Diagnosis not present

## 2019-03-25 DIAGNOSIS — M25562 Pain in left knee: Secondary | ICD-10-CM | POA: Insufficient documentation

## 2019-03-25 DIAGNOSIS — M25551 Pain in right hip: Secondary | ICD-10-CM | POA: Diagnosis not present

## 2019-03-25 DIAGNOSIS — M1712 Unilateral primary osteoarthritis, left knee: Secondary | ICD-10-CM | POA: Diagnosis not present

## 2019-03-25 DIAGNOSIS — M1711 Unilateral primary osteoarthritis, right knee: Secondary | ICD-10-CM | POA: Diagnosis not present

## 2019-04-05 DIAGNOSIS — E785 Hyperlipidemia, unspecified: Secondary | ICD-10-CM | POA: Diagnosis not present

## 2019-04-05 DIAGNOSIS — I10 Essential (primary) hypertension: Secondary | ICD-10-CM | POA: Diagnosis not present

## 2019-04-05 DIAGNOSIS — M130 Polyarthritis, unspecified: Secondary | ICD-10-CM | POA: Diagnosis not present

## 2019-08-04 ENCOUNTER — Other Ambulatory Visit: Payer: Self-pay

## 2019-08-04 ENCOUNTER — Ambulatory Visit: Payer: Medicare HMO | Attending: Internal Medicine

## 2019-08-04 DIAGNOSIS — Z23 Encounter for immunization: Secondary | ICD-10-CM | POA: Insufficient documentation

## 2019-08-04 NOTE — Progress Notes (Signed)
   Covid-19 Vaccination Clinic  Name:  Erin Pacheco    MRN: [id number] DOB: May 10, 1951  08/04/2019  [name] was observed post Covid-19 immunization for 15 minutes without incidence. She was provided with Vaccine Information Sheet and instruction to access the V-Safe system.   [name] was instructed to call 911 with any severe reactions post vaccine: Marland Kitchen Difficulty breathing  . Swelling of your face and throat  . A fast heartbeat  . A bad rash all over your body  . Dizziness and weakness    Immunizations Administered    Name Date Dose VIS Date Route   Moderna COVID-19 Vaccine 08/04/2019  1:47 PM 0.5 mL 05/07/2019 Intramuscular   Manufacturer: Moderna   Lot: [id number]   NDC: 00447-158-06

## 2019-08-09 DIAGNOSIS — I10 Essential (primary) hypertension: Secondary | ICD-10-CM | POA: Diagnosis not present

## 2019-08-09 DIAGNOSIS — R635 Abnormal weight gain: Secondary | ICD-10-CM | POA: Diagnosis not present

## 2019-08-09 DIAGNOSIS — R7309 Other abnormal glucose: Secondary | ICD-10-CM | POA: Diagnosis not present

## 2019-08-09 DIAGNOSIS — M130 Polyarthritis, unspecified: Secondary | ICD-10-CM | POA: Diagnosis not present

## 2019-08-09 DIAGNOSIS — F064 Anxiety disorder due to known physiological condition: Secondary | ICD-10-CM | POA: Diagnosis not present

## 2019-08-09 DIAGNOSIS — E782 Mixed hyperlipidemia: Secondary | ICD-10-CM | POA: Diagnosis not present

## 2019-09-03 ENCOUNTER — Ambulatory Visit: Payer: Medicare HMO | Attending: Internal Medicine

## 2019-09-03 DIAGNOSIS — Z23 Encounter for immunization: Secondary | ICD-10-CM

## 2019-09-03 NOTE — Progress Notes (Signed)
   Covid-19 Vaccination Clinic  Name:  Erin Pacheco    MRN: [id number] DOB: 01/11/1951  09/03/2019  [name] was observed post Covid-19 immunization for 15 minutes without incident. She was provided with Vaccine Information Sheet and instruction to access the V-Safe system.   [name] was instructed to call 911 with any severe reactions post vaccine: Marland Kitchen Difficulty breathing  . Swelling of face and throat  . A fast heartbeat  . A bad rash all over body  . Dizziness and weakness   Immunizations Administered    Name Date Dose VIS Date Route   Moderna COVID-19 Vaccine 09/03/2019 11:44 AM 0.5 mL 05/07/2019 Intramuscular   Manufacturer: Moderna   Lot: [id number]   NDC: 55217-471-59

## 2019-09-07 ENCOUNTER — Ambulatory Visit: Payer: Medicare HMO

## 2019-10-21 ENCOUNTER — Other Ambulatory Visit (HOSPITAL_COMMUNITY): Payer: Self-pay | Admitting: Family Medicine

## 2019-10-21 DIAGNOSIS — Z1231 Encounter for screening mammogram for malignant neoplasm of breast: Secondary | ICD-10-CM

## 2019-11-13 DIAGNOSIS — M130 Polyarthritis, unspecified: Secondary | ICD-10-CM | POA: Diagnosis not present

## 2019-11-13 DIAGNOSIS — J301 Allergic rhinitis due to pollen: Secondary | ICD-10-CM | POA: Diagnosis not present

## 2019-11-13 DIAGNOSIS — I10 Essential (primary) hypertension: Secondary | ICD-10-CM | POA: Diagnosis not present

## 2019-11-13 DIAGNOSIS — E785 Hyperlipidemia, unspecified: Secondary | ICD-10-CM | POA: Diagnosis not present

## 2019-11-25 ENCOUNTER — Ambulatory Visit (HOSPITAL_COMMUNITY)
Admission: RE | Admit: 2019-11-25 | Discharge: 2019-11-25 | Disposition: A | Discharge status: Home / Self Care | Payer: Medicare HMO | Source: Ambulatory Visit | Attending: Family Medicine | Admitting: Family Medicine

## 2019-11-25 ENCOUNTER — Other Ambulatory Visit: Payer: Self-pay

## 2019-11-25 DIAGNOSIS — Z1231 Encounter for screening mammogram for malignant neoplasm of breast: Secondary | ICD-10-CM | POA: Diagnosis not present

## 2020-02-04 DIAGNOSIS — I10 Essential (primary) hypertension: Secondary | ICD-10-CM | POA: Diagnosis not present

## 2020-02-04 DIAGNOSIS — M130 Polyarthritis, unspecified: Secondary | ICD-10-CM | POA: Diagnosis not present

## 2020-02-04 DIAGNOSIS — E785 Hyperlipidemia, unspecified: Secondary | ICD-10-CM | POA: Diagnosis not present

## 2020-03-19 DIAGNOSIS — R7309 Other abnormal glucose: Secondary | ICD-10-CM | POA: Diagnosis not present

## 2020-03-19 DIAGNOSIS — M130 Polyarthritis, unspecified: Secondary | ICD-10-CM | POA: Diagnosis not present

## 2020-03-19 DIAGNOSIS — Z23 Encounter for immunization: Secondary | ICD-10-CM | POA: Diagnosis not present

## 2020-03-19 DIAGNOSIS — I10 Essential (primary) hypertension: Secondary | ICD-10-CM | POA: Diagnosis not present

## 2020-03-19 DIAGNOSIS — M1990 Unspecified osteoarthritis, unspecified site: Secondary | ICD-10-CM | POA: Diagnosis not present

## 2020-03-19 DIAGNOSIS — E7800 Pure hypercholesterolemia, unspecified: Secondary | ICD-10-CM | POA: Diagnosis not present

## 2020-03-19 DIAGNOSIS — E559 Vitamin D deficiency, unspecified: Secondary | ICD-10-CM | POA: Diagnosis not present

## 2020-03-19 DIAGNOSIS — Z0001 Encounter for general adult medical examination with abnormal findings: Secondary | ICD-10-CM | POA: Diagnosis not present

## 2020-03-26 DIAGNOSIS — H5200 Hypermetropia, unspecified eye: Secondary | ICD-10-CM | POA: Diagnosis not present

## 2020-03-26 DIAGNOSIS — Z0100 Encounter for examination of eyes and vision without abnormal findings: Secondary | ICD-10-CM | POA: Diagnosis not present

## 2020-05-21 ENCOUNTER — Ambulatory Visit: Payer: Medicare HMO | Attending: Internal Medicine

## 2020-05-21 DIAGNOSIS — Z23 Encounter for immunization: Secondary | ICD-10-CM

## 2020-05-21 NOTE — Progress Notes (Signed)
   Covid-19 Vaccination Clinic  Name:  TAWNEY VANORMAN    MRN: [id number] DOB: 1951-01-13  05/21/2020  Ms. Boehning was observed post Covid-19 immunization for 15 minutes without incident. She was provided with Vaccine Information Sheet and instruction to access the V-Safe system.   Ms. Drenning was instructed to call 911 with any severe reactions post vaccine: Marland Kitchen Difficulty breathing  . Swelling of face and throat  . A fast heartbeat  . A bad rash all over body  . Dizziness and weakness   Immunizations Administered    Name Date Dose VIS Date Route   Moderna Covid-19 Booster Vaccine 05/21/2020  2:29 PM 0.25 mL 03/25/2020 Intramuscular   Manufacturer: Moderna   Lot: [id number]   NDC: 59292-446-28

## 2020-06-19 DIAGNOSIS — F064 Anxiety disorder due to known physiological condition: Secondary | ICD-10-CM | POA: Diagnosis not present

## 2020-06-19 DIAGNOSIS — M1990 Unspecified osteoarthritis, unspecified site: Secondary | ICD-10-CM | POA: Diagnosis not present

## 2020-06-19 DIAGNOSIS — I10 Essential (primary) hypertension: Secondary | ICD-10-CM | POA: Diagnosis not present

## 2020-06-19 DIAGNOSIS — E785 Hyperlipidemia, unspecified: Secondary | ICD-10-CM | POA: Diagnosis not present

## 2020-10-19 DIAGNOSIS — I10 Essential (primary) hypertension: Secondary | ICD-10-CM | POA: Diagnosis not present

## 2020-10-19 DIAGNOSIS — E785 Hyperlipidemia, unspecified: Secondary | ICD-10-CM | POA: Diagnosis not present

## 2020-10-19 DIAGNOSIS — E7800 Pure hypercholesterolemia, unspecified: Secondary | ICD-10-CM | POA: Diagnosis not present

## 2020-10-20 ENCOUNTER — Other Ambulatory Visit (HOSPITAL_COMMUNITY): Payer: Self-pay | Admitting: Family Medicine

## 2020-10-20 DIAGNOSIS — Z1231 Encounter for screening mammogram for malignant neoplasm of breast: Secondary | ICD-10-CM

## 2020-11-26 ENCOUNTER — Ambulatory Visit (HOSPITAL_COMMUNITY): Payer: Medicare HMO

## 2020-11-30 ENCOUNTER — Ambulatory Visit (HOSPITAL_COMMUNITY): Payer: Medicare HMO

## 2020-12-03 ENCOUNTER — Other Ambulatory Visit: Payer: Self-pay

## 2020-12-03 ENCOUNTER — Ambulatory Visit (HOSPITAL_COMMUNITY)
Admission: RE | Admit: 2020-12-03 | Discharge: 2020-12-03 | Disposition: A | Discharge status: Home / Self Care | Payer: Medicare HMO | Source: Ambulatory Visit | Attending: Family Medicine | Admitting: Family Medicine

## 2020-12-03 DIAGNOSIS — Z1231 Encounter for screening mammogram for malignant neoplasm of breast: Secondary | ICD-10-CM | POA: Insufficient documentation

## 2021-01-03 DIAGNOSIS — E785 Hyperlipidemia, unspecified: Secondary | ICD-10-CM | POA: Diagnosis not present

## 2021-01-03 DIAGNOSIS — M130 Polyarthritis, unspecified: Secondary | ICD-10-CM | POA: Diagnosis not present

## 2021-01-03 DIAGNOSIS — I10 Essential (primary) hypertension: Secondary | ICD-10-CM | POA: Diagnosis not present

## 2021-02-03 DIAGNOSIS — M130 Polyarthritis, unspecified: Secondary | ICD-10-CM | POA: Diagnosis not present

## 2021-02-03 DIAGNOSIS — E785 Hyperlipidemia, unspecified: Secondary | ICD-10-CM | POA: Diagnosis not present

## 2021-02-03 DIAGNOSIS — I10 Essential (primary) hypertension: Secondary | ICD-10-CM | POA: Diagnosis not present

## 2021-02-19 DIAGNOSIS — E785 Hyperlipidemia, unspecified: Secondary | ICD-10-CM | POA: Diagnosis not present

## 2021-02-19 DIAGNOSIS — I10 Essential (primary) hypertension: Secondary | ICD-10-CM | POA: Diagnosis not present

## 2021-02-19 DIAGNOSIS — G4762 Sleep related leg cramps: Secondary | ICD-10-CM | POA: Diagnosis not present

## 2021-02-19 DIAGNOSIS — R7309 Other abnormal glucose: Secondary | ICD-10-CM | POA: Diagnosis not present

## 2021-02-19 DIAGNOSIS — M130 Polyarthritis, unspecified: Secondary | ICD-10-CM | POA: Diagnosis not present

## 2021-04-05 ENCOUNTER — Other Ambulatory Visit: Payer: Self-pay

## 2021-04-05 DIAGNOSIS — M130 Polyarthritis, unspecified: Secondary | ICD-10-CM | POA: Diagnosis not present

## 2021-04-05 DIAGNOSIS — E785 Hyperlipidemia, unspecified: Secondary | ICD-10-CM | POA: Diagnosis not present

## 2021-04-05 DIAGNOSIS — I10 Essential (primary) hypertension: Secondary | ICD-10-CM | POA: Diagnosis not present

## 2021-04-05 DIAGNOSIS — I739 Peripheral vascular disease, unspecified: Secondary | ICD-10-CM

## 2021-04-09 ENCOUNTER — Encounter (HOSPITAL_COMMUNITY): Payer: Self-pay

## 2021-04-09 ENCOUNTER — Ambulatory Visit: Payer: Medicare HMO | Admitting: Vascular Surgery

## 2021-04-09 ENCOUNTER — Ambulatory Visit (HOSPITAL_COMMUNITY)
Admission: RE | Admit: 2021-04-09 | Discharge: 2021-04-09 | Disposition: A | Discharge status: Home / Self Care | Payer: Medicare HMO | Source: Ambulatory Visit | Attending: Vascular Surgery | Admitting: Vascular Surgery

## 2021-04-09 ENCOUNTER — Other Ambulatory Visit: Payer: Self-pay

## 2021-04-09 ENCOUNTER — Encounter: Payer: Self-pay | Admitting: Vascular Surgery

## 2021-04-09 VITALS — BP 118/75 | HR 73 | Temp 98.4°F | Resp 20 | Ht 63.0 in | Wt 144.0 lb

## 2021-04-09 DIAGNOSIS — I739 Peripheral vascular disease, unspecified: Secondary | ICD-10-CM

## 2021-04-09 NOTE — Progress Notes (Signed)
Office Note     CC: Paresthesias in the right hand Requesting Provider:  Renaye Rakers, MD  HPI: Erin Pacheco is a 70 y.o. (Nov 01, 1950) female presenting at the request of .Renaye Rakers, MD for paresthesias in the right first through third digits.  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  Several weeks ago she appreciated numbness and tingling at the tips of her right first second and third digits.  This waxes and wanes.  There is no associated wounds.  Symptoms are random, and not associated with use.  Symptoms did not involve the fourth and fifth digits.  She has had no motor weakness associated.  She denies symptoms of claudication, rest pain, tissue loss in the lower extremities. Erin Pacheco is excited for her 70th birthday coming up in December.  The pt is not on a statin for cholesterol management.  The pt is  on a daily aspirin.   Other AC:  - The pt is on medication for hypertension.   The pt is not diabetic.  Tobacco hx:  daily  Past Medical History:  Diagnosis Date   Arthritis    Heart murmur    Hypertension     Past Surgical History:  Procedure Laterality Date   ABDOMINAL HYSTERECTOMY     BILATERAL OOPHORECTOMY     CATARACT EXTRACTION W/PHACO  12/27/2010   Procedure: CATARACT EXTRACTION PHACO AND INTRAOCULAR LENS PLACEMENT (IOC);  Surgeon: Gemma Payor;  Location: [facility];  Service: Ophthalmology;  Laterality: Right;   CATARACT EXTRACTION W/PHACO  03/28/2011   Procedure: CATARACT EXTRACTION PHACO AND INTRAOCULAR LENS PLACEMENT (IOC);  Surgeon: Gemma Payor;  Location: [facility];  Service: Ophthalmology;  Laterality: Left;  CDE 7.27    Social History   Socioeconomic History   Marital status: Single    Spouse name: Not on file   Number of children: Not on file   Years of education: Not on file   Highest education level: Not on file  Occupational History   Not on file  Tobacco Use   Smoking status: Every Day    Packs/day: 0.50     Years: 30.00    Pack years: 15.00    Types: Cigarettes   Smokeless tobacco: Never  Vaping Use   Vaping Use: Never used  Substance and Sexual Activity   Alcohol use: No   Drug use: No   Sexual activity: Yes    Birth control/protection: Surgical  Other Topics Concern   Not on file  Social History Narrative   Not on file   Social Determinants of Health   Financial Resource Strain: Not on file  Food Insecurity: Not on file  Transportation Needs: Not on file  Physical Activity: Not on file  Stress: Not on file  Social Connections: Not on file  Intimate Partner Violence: Not on file    Family History  Problem Relation Age of Onset   Anesthesia problems Neg Hx    Hypotension Neg Hx    Malignant hyperthermia Neg Hx    Pseudochol deficiency Neg Hx     Current Outpatient Medications  Medication Sig Dispense Refill   aliskiren (TEKTURNA) 150 MG tablet Take 150 mg by mouth daily.       aspirin 325 MG tablet Take 325 mg by mouth daily as needed. For pain      dexamethasone (DECADRON) 4 MG tablet Take 4 mg by mouth daily as needed. For arthritis pain     diclofenac (VOLTAREN) 75  MG EC tablet      ibuprofen (ADVIL,MOTRIN) 200 MG tablet Take 400-600 mg by mouth every 6 (six) hours as needed. Pain      LORazepam (ATIVAN) 1 MG tablet Take 1 tablet (1 mg total) by mouth 3 (three) times daily as needed for anxiety. 20 tablet 0   Multiple Vitamin (MULTIVITAMIN) tablet Take 1 tablet by mouth daily.       Polyethyl Glycol-Propyl Glycol (SYSTANE OP) Apply 1 drop to eye daily as needed. Dry Eyes      valsartan-hydrochlorothiazide (DIOVAN-HCT) 160-12.5 MG tablet      No current facility-administered medications for this visit.    No Known Allergies   REVIEW OF SYSTEMS:   [X]  denotes positive finding, [ ]  denotes negative finding Cardiac  Comments:  Chest pain or chest pressure:    Shortness of breath upon exertion:    Short of breath when lying flat:    Irregular heart rhythm:         Vascular    Pain in calf, thigh, or hip brought on by ambulation:    Pain in feet at night that wakes you up from your sleep:     Blood clot in your veins:    Leg swelling:         Pulmonary    Oxygen at home:    Productive cough:     Wheezing:         Neurologic    Sudden weakness in arms or legs:     Sudden numbness in arms or legs:     Sudden onset of difficulty speaking or slurred speech:    Temporary loss of vision in one eye:     Problems with dizziness:         Gastrointestinal    Blood in stool:     Vomited blood:         Genitourinary    Burning when urinating:     Blood in urine:        Psychiatric    Major depression:         Hematologic    Bleeding problems:    Problems with blood clotting too easily:        Skin    Rashes or ulcers:        Constitutional    Fever or chills:      PHYSICAL EXAMINATION:  Vitals:   04/09/21 1541  BP: 118/75  Pulse: 73  Resp: 20  Temp: 98.4 F (36.9 C)  SpO2: 96%  Weight: 144 lb (65.3 kg)  Height: 5\' 3"  (1.6 m)    General:  WDWN in NAD; vital signs documented above Gait: Not observed HENT: WNL, normocephalic Pulmonary: normal non-labored breathing , without Rales, rhonchi,  wheezing Cardiac: regular HR, Abdomen: soft, NT, no masses Skin: without rashes Vascular Exam/Pulses:  Right Left  Radial 2+ (normal) 2+ (normal)  Ulnar 2+ (normal) 2+ (normal)  Femoral    Popliteal    DP 2+ (normal) 2+ (normal)  PT 2+ (normal) 2+ (normal)   Extremities: without ischemic changes, without Gangrene , without cellulitis; without open wounds;  Musculoskeletal: no muscle wasting or atrophy  Neurologic: A&O X 3;  No focal weakness or paresthesias are detected Psychiatric:  The pt has Normal affect.   Non-Invasive Vascular Imaging:       ASSESSMENT/PLAN: Erin Pacheco is a 70 y.o. female presenting with episodes of paresthesia in the right hand involving the first second and third digits.  This is not  associated with use.  No wounds on the hand.  No rest pain. She has a palpable pulse at the wrist with digital signals appreciated on Doppler. Lower extremity studies demonstrate mild atherosclerotic disease.  No symptoms of claudication, rest pain, tissue loss.  Patient's hand symptoms are most consistent with median nerve irritation-possible mild tunnel syndrome. I recommended ibuprofen for the next 2 weeks for anti-inflammatory effect.  If this does not solve the problem, recommend hand surgery consult for further recommendations.   Recommend the following which can slow the progression of atherosclerosis and reduce the risk of major adverse cardiac / limb events:  Aspirin 81mg  PO QD.  Atorvastatin 40-80mg  PO QD (or other "high intensity" statin therapy). Complete cessation from all tobacco products. Blood glucose control with goal A1c < 7%. Blood pressure control with goal blood pressure < 140/90 mmHg. Lipid reduction therapy with goal LDL-C <100 mg/dL ( if symptomatic from PAD).     <85, MD [facility] [phone]

## 2021-04-23 DIAGNOSIS — G5610 Other lesions of median nerve, unspecified upper limb: Secondary | ICD-10-CM | POA: Diagnosis not present

## 2021-06-04 DIAGNOSIS — E785 Hyperlipidemia, unspecified: Secondary | ICD-10-CM | POA: Diagnosis not present

## 2021-06-04 DIAGNOSIS — I10 Essential (primary) hypertension: Secondary | ICD-10-CM | POA: Diagnosis not present

## 2021-06-04 DIAGNOSIS — M130 Polyarthritis, unspecified: Secondary | ICD-10-CM | POA: Diagnosis not present

## 2021-06-18 DIAGNOSIS — I10 Essential (primary) hypertension: Secondary | ICD-10-CM | POA: Diagnosis not present

## 2021-08-10 DIAGNOSIS — H5200 Hypermetropia, unspecified eye: Secondary | ICD-10-CM | POA: Diagnosis not present

## 2021-08-10 DIAGNOSIS — Z0100 Encounter for examination of eyes and vision without abnormal findings: Secondary | ICD-10-CM | POA: Diagnosis not present

## 2021-08-13 DIAGNOSIS — E7800 Pure hypercholesterolemia, unspecified: Secondary | ICD-10-CM | POA: Diagnosis not present

## 2021-08-13 DIAGNOSIS — I10 Essential (primary) hypertension: Secondary | ICD-10-CM | POA: Diagnosis not present

## 2021-08-13 DIAGNOSIS — Z6824 Body mass index (BMI) 24.0-24.9, adult: Secondary | ICD-10-CM | POA: Diagnosis not present

## 2021-08-13 DIAGNOSIS — F064 Anxiety disorder due to known physiological condition: Secondary | ICD-10-CM | POA: Diagnosis not present

## 2021-08-13 DIAGNOSIS — K921 Melena: Secondary | ICD-10-CM | POA: Diagnosis not present

## 2021-10-14 IMAGING — MG MM DIGITAL SCREENING BILAT W/ TOMO AND CAD
8 series · 9 of 24 positions shown · non-contrast
Comparison: Previous exam(s).

CLINICAL DATA: Screening.

EXAM:
DIGITAL SCREENING BILATERAL MAMMOGRAM WITH TOMOSYNTHESIS AND CAD
TECHNIQUE: Bilateral screening digital craniocaudal and mediolateral oblique
mammograms were obtained. Bilateral screening digital breast
tomosynthesis was performed. The images were evaluated with
computer-aided detection.

[R CC synth-2D]
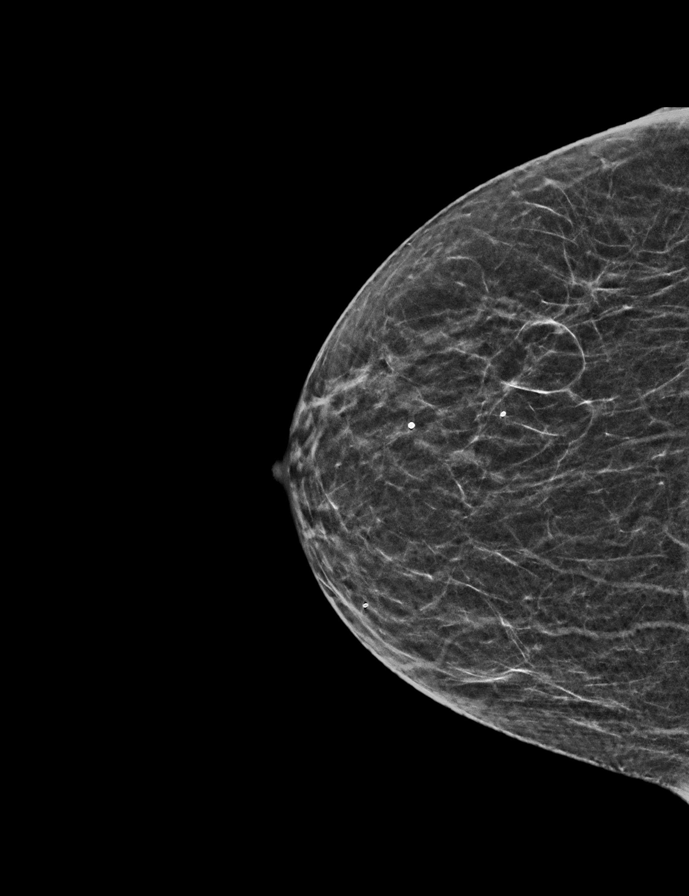

[L MLO synth-2D]
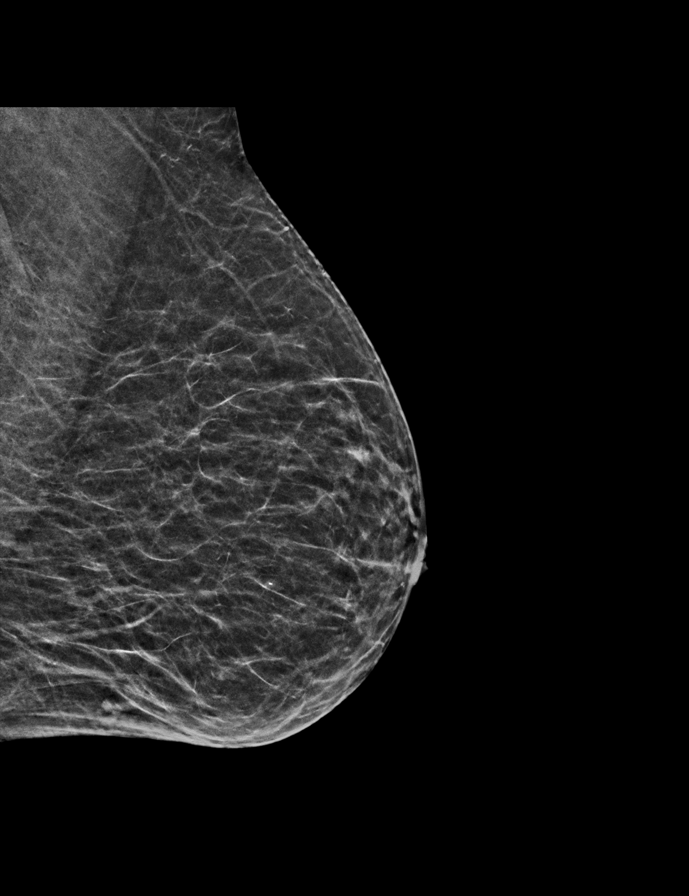

[R MLO synth-2D]
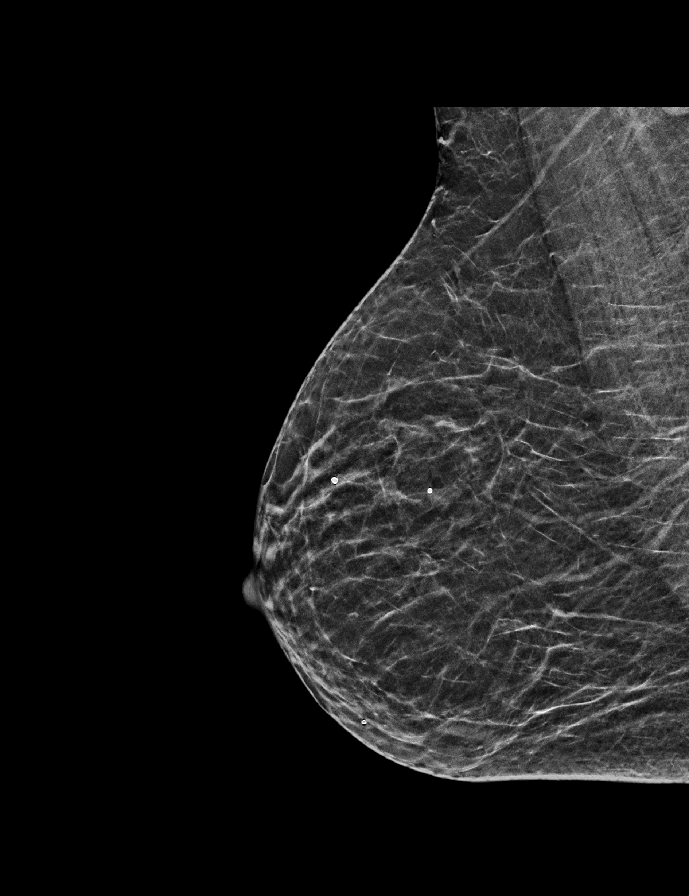

[L CC synth-2D]
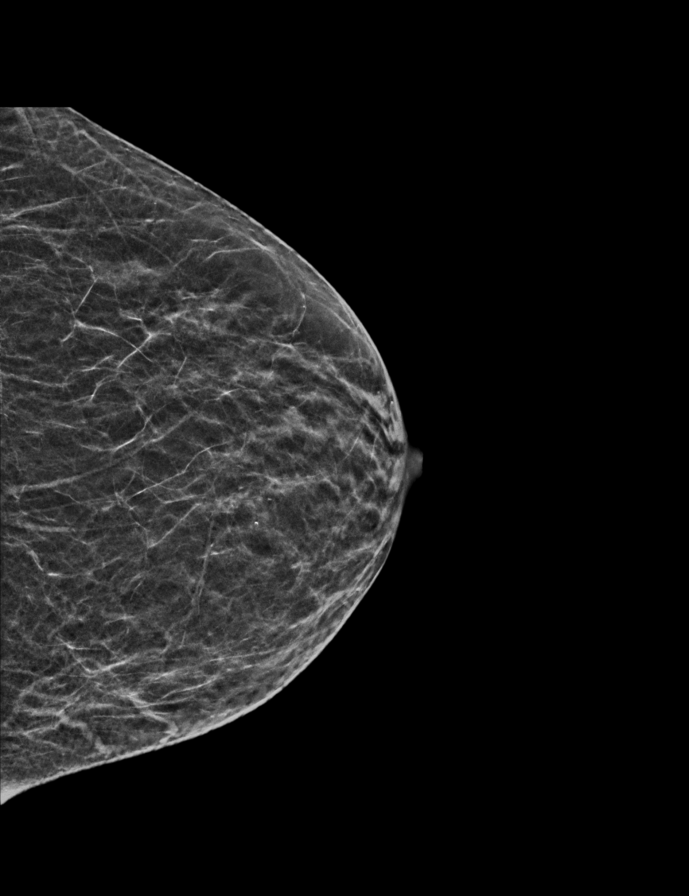

[R CC tomo · 2 of 41 frames shown]
[frame 14/41]
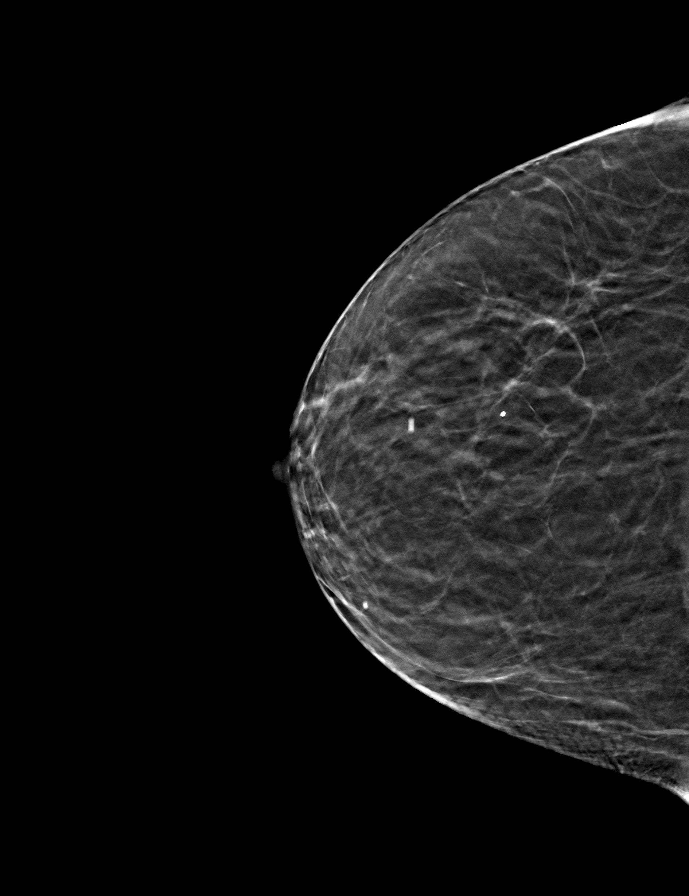
[frame 21/41]
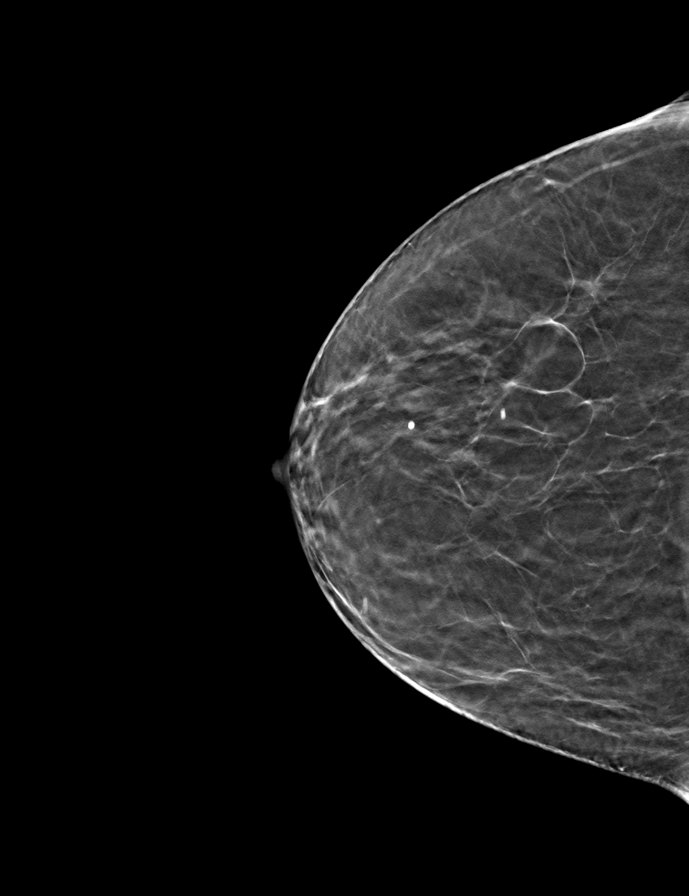

[R MLO tomo · tomo slice 20/39.0]
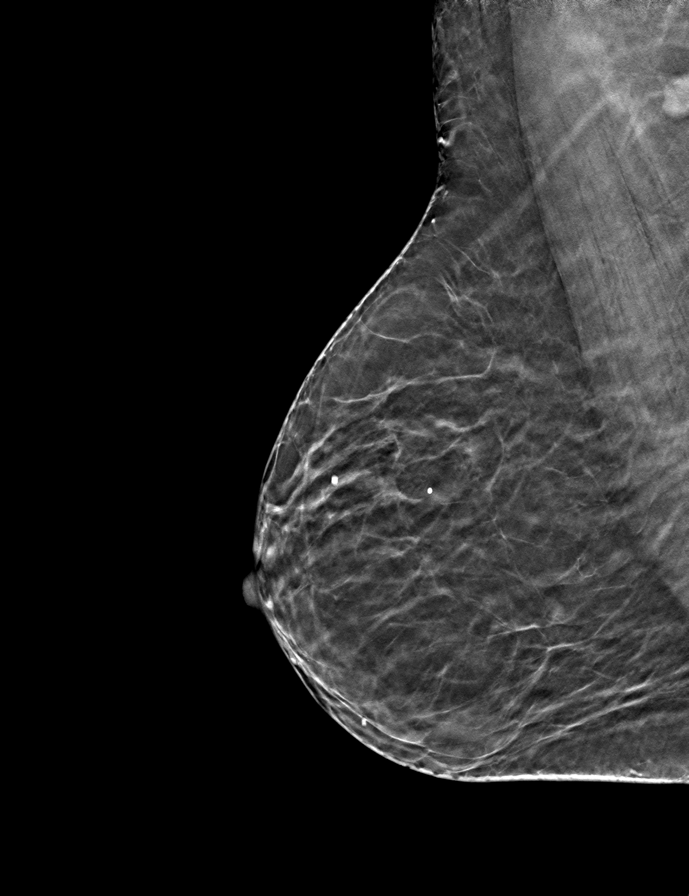

[L CC tomo · tomo slice 20/39.0]
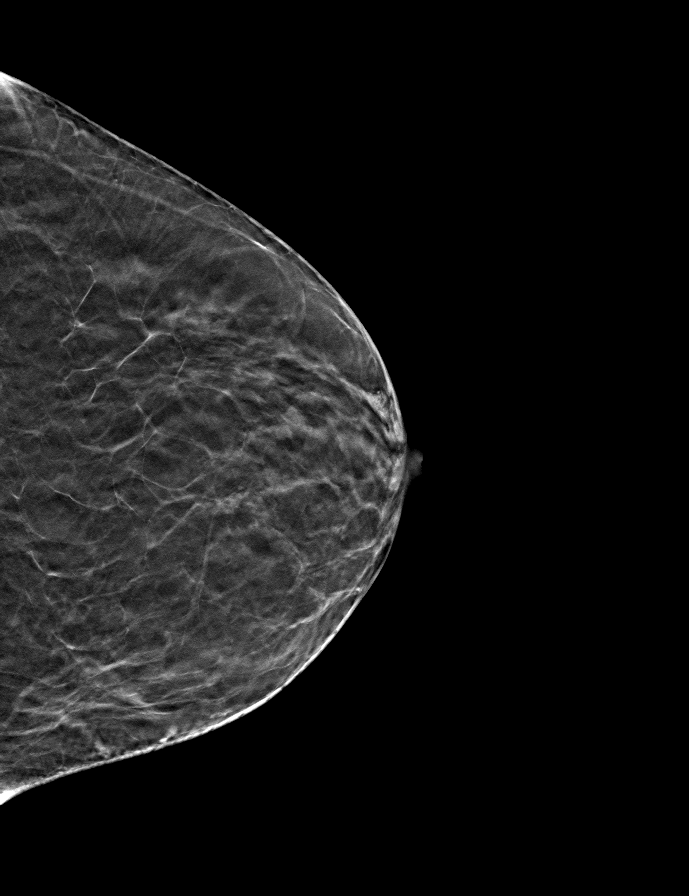

[L MLO tomo · tomo slice 21/41.0]
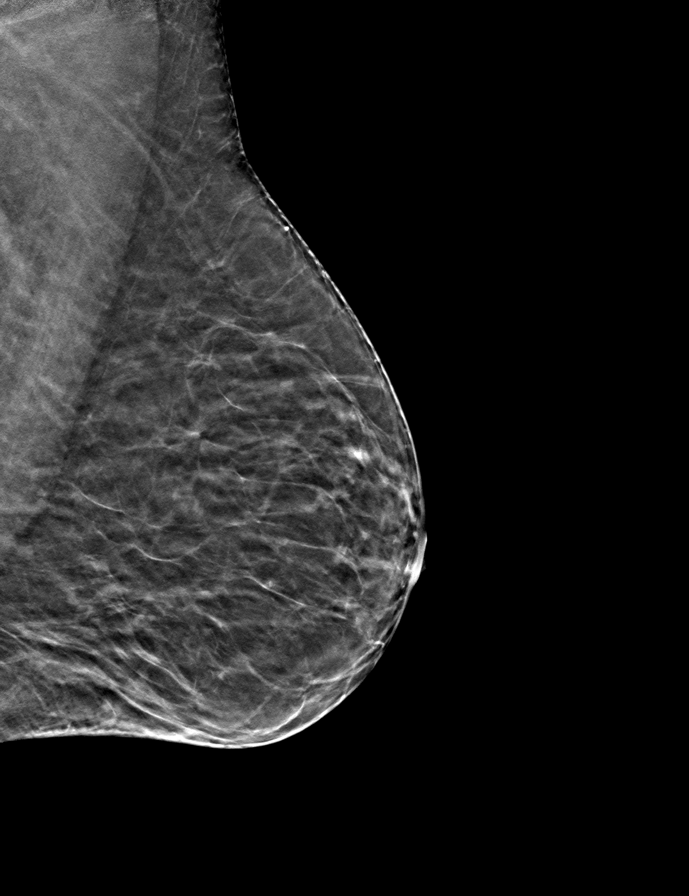

[9 of 24 positions shown; findings below may reference images not displayed]

ACR Breast Density Category b: There are scattered areas of
fibroglandular density.
FINDINGS: There are no findings suspicious for malignancy.
IMPRESSION: No mammographic evidence of malignancy. A result letter of this
screening mammogram will be mailed directly to the patient.

RECOMMENDATION:
Screening mammogram in one year. (Code:51-O-LD2)

BI-RADS CATEGORY  1: Negative.

## 2021-10-27 ENCOUNTER — Other Ambulatory Visit (HOSPITAL_COMMUNITY): Payer: Self-pay | Admitting: Family Medicine

## 2021-10-27 DIAGNOSIS — Z1231 Encounter for screening mammogram for malignant neoplasm of breast: Secondary | ICD-10-CM

## 2021-11-10 DIAGNOSIS — R051 Acute cough: Secondary | ICD-10-CM | POA: Diagnosis not present

## 2021-11-10 DIAGNOSIS — E785 Hyperlipidemia, unspecified: Secondary | ICD-10-CM | POA: Diagnosis not present

## 2021-11-10 DIAGNOSIS — Z0000 Encounter for general adult medical examination without abnormal findings: Secondary | ICD-10-CM | POA: Diagnosis not present

## 2021-11-10 DIAGNOSIS — J9801 Acute bronchospasm: Secondary | ICD-10-CM | POA: Diagnosis not present

## 2021-11-10 DIAGNOSIS — I10 Essential (primary) hypertension: Secondary | ICD-10-CM | POA: Diagnosis not present

## 2021-12-02 DIAGNOSIS — I10 Essential (primary) hypertension: Secondary | ICD-10-CM | POA: Diagnosis not present

## 2021-12-02 DIAGNOSIS — J22 Unspecified acute lower respiratory infection: Secondary | ICD-10-CM | POA: Diagnosis not present

## 2021-12-02 DIAGNOSIS — J441 Chronic obstructive pulmonary disease with (acute) exacerbation: Secondary | ICD-10-CM | POA: Diagnosis not present

## 2021-12-02 DIAGNOSIS — Z720 Tobacco use: Secondary | ICD-10-CM | POA: Diagnosis not present

## 2021-12-06 ENCOUNTER — Ambulatory Visit (HOSPITAL_COMMUNITY): Payer: Medicare HMO

## 2021-12-08 ENCOUNTER — Inpatient Hospital Stay (HOSPITAL_COMMUNITY): Admission: RE | Admit: 2021-12-08 | Payer: Medicare HMO | Source: Ambulatory Visit

## 2022-01-17 ENCOUNTER — Ambulatory Visit (HOSPITAL_COMMUNITY)
Admission: RE | Admit: 2022-01-17 | Discharge: 2022-01-17 | Disposition: A | Discharge status: Home / Self Care | Payer: Medicare HMO | Source: Ambulatory Visit | Attending: Family Medicine | Admitting: Family Medicine

## 2022-01-17 DIAGNOSIS — Z1231 Encounter for screening mammogram for malignant neoplasm of breast: Secondary | ICD-10-CM | POA: Diagnosis not present

## 2022-05-05 DIAGNOSIS — I10 Essential (primary) hypertension: Secondary | ICD-10-CM | POA: Diagnosis not present

## 2022-05-05 DIAGNOSIS — M21611 Bunion of right foot: Secondary | ICD-10-CM | POA: Diagnosis not present

## 2022-05-05 DIAGNOSIS — M130 Polyarthritis, unspecified: Secondary | ICD-10-CM | POA: Diagnosis not present

## 2022-05-05 DIAGNOSIS — E785 Hyperlipidemia, unspecified: Secondary | ICD-10-CM | POA: Diagnosis not present

## 2022-09-08 DIAGNOSIS — I10 Essential (primary) hypertension: Secondary | ICD-10-CM | POA: Diagnosis not present

## 2022-10-18 ENCOUNTER — Ambulatory Visit: Payer: Medicare HMO | Admitting: Podiatry

## 2022-10-18 ENCOUNTER — Encounter: Payer: Self-pay | Admitting: Podiatry

## 2022-10-18 DIAGNOSIS — M2011 Hallux valgus (acquired), right foot: Secondary | ICD-10-CM | POA: Diagnosis not present

## 2022-10-18 DIAGNOSIS — L84 Corns and callosities: Secondary | ICD-10-CM | POA: Diagnosis not present

## 2022-10-18 DIAGNOSIS — M21611 Bunion of right foot: Secondary | ICD-10-CM

## 2022-10-18 DIAGNOSIS — M2041 Other hammer toe(s) (acquired), right foot: Secondary | ICD-10-CM
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
## 2022-10-18 NOTE — Patient Instructions (Signed)
More silicone pads can be purchased from:  [email or web address]  

## 2022-11-16 ENCOUNTER — Inpatient Hospital Stay (HOSPITAL_COMMUNITY): Payer: Medicare HMO

## 2022-11-16 ENCOUNTER — Emergency Department (HOSPITAL_COMMUNITY): Payer: Medicare HMO

## 2022-11-16 ENCOUNTER — Inpatient Hospital Stay (HOSPITAL_COMMUNITY)
Admission: EM | Admit: 2022-11-16 | Discharge: 2022-11-26 | DRG: 176 | Disposition: A | Discharge status: Home / Self Care | Payer: Medicare HMO | Attending: Family Medicine | Admitting: Family Medicine

## 2022-11-16 ENCOUNTER — Other Ambulatory Visit (HOSPITAL_COMMUNITY): Payer: Self-pay

## 2022-11-16 ENCOUNTER — Encounter (HOSPITAL_COMMUNITY): Payer: Self-pay | Admitting: Emergency Medicine

## 2022-11-16 ENCOUNTER — Other Ambulatory Visit: Payer: Self-pay

## 2022-11-16 DIAGNOSIS — I2609 Other pulmonary embolism with acute cor pulmonale: Secondary | ICD-10-CM

## 2022-11-16 DIAGNOSIS — F17200 Nicotine dependence, unspecified, uncomplicated: Secondary | ICD-10-CM | POA: Diagnosis present

## 2022-11-16 DIAGNOSIS — Z5181 Encounter for therapeutic drug level monitoring: Secondary | ICD-10-CM | POA: Diagnosis not present

## 2022-11-16 DIAGNOSIS — M1990 Unspecified osteoarthritis, unspecified site: Secondary | ICD-10-CM | POA: Diagnosis present

## 2022-11-16 DIAGNOSIS — Z79899 Other long term (current) drug therapy: Secondary | ICD-10-CM | POA: Diagnosis not present

## 2022-11-16 DIAGNOSIS — E278 Other specified disorders of adrenal gland: Secondary | ICD-10-CM | POA: Diagnosis not present

## 2022-11-16 DIAGNOSIS — D500 Iron deficiency anemia secondary to blood loss (chronic): Secondary | ICD-10-CM

## 2022-11-16 DIAGNOSIS — D638 Anemia in other chronic diseases classified elsewhere: Secondary | ICD-10-CM | POA: Diagnosis not present

## 2022-11-16 DIAGNOSIS — N189 Chronic kidney disease, unspecified: Secondary | ICD-10-CM | POA: Diagnosis present

## 2022-11-16 DIAGNOSIS — R109 Unspecified abdominal pain: Secondary | ICD-10-CM | POA: Diagnosis not present

## 2022-11-16 DIAGNOSIS — I2699 Other pulmonary embolism without acute cor pulmonale: Secondary | ICD-10-CM

## 2022-11-16 DIAGNOSIS — I420 Dilated cardiomyopathy: Secondary | ICD-10-CM | POA: Diagnosis not present

## 2022-11-16 DIAGNOSIS — C801 Malignant (primary) neoplasm, unspecified: Secondary | ICD-10-CM | POA: Diagnosis not present

## 2022-11-16 DIAGNOSIS — M7989 Other specified soft tissue disorders: Secondary | ICD-10-CM | POA: Diagnosis not present

## 2022-11-16 DIAGNOSIS — C7971 Secondary malignant neoplasm of right adrenal gland: Secondary | ICD-10-CM | POA: Diagnosis not present

## 2022-11-16 DIAGNOSIS — Z7901 Long term (current) use of anticoagulants: Secondary | ICD-10-CM | POA: Diagnosis not present

## 2022-11-16 DIAGNOSIS — I5020 Unspecified systolic (congestive) heart failure: Secondary | ICD-10-CM | POA: Diagnosis present

## 2022-11-16 DIAGNOSIS — R509 Fever, unspecified: Secondary | ICD-10-CM | POA: Diagnosis not present

## 2022-11-16 DIAGNOSIS — I351 Nonrheumatic aortic (valve) insufficiency: Secondary | ICD-10-CM | POA: Diagnosis not present

## 2022-11-16 DIAGNOSIS — R918 Other nonspecific abnormal finding of lung field: Secondary | ICD-10-CM | POA: Diagnosis not present

## 2022-11-16 DIAGNOSIS — I5021 Acute systolic (congestive) heart failure: Secondary | ICD-10-CM | POA: Diagnosis not present

## 2022-11-16 DIAGNOSIS — I2694 Multiple subsegmental pulmonary emboli without acute cor pulmonale: Secondary | ICD-10-CM | POA: Diagnosis not present

## 2022-11-16 DIAGNOSIS — E279 Disorder of adrenal gland, unspecified: Secondary | ICD-10-CM | POA: Diagnosis not present

## 2022-11-16 DIAGNOSIS — C3412 Malignant neoplasm of upper lobe, left bronchus or lung: Secondary | ICD-10-CM | POA: Diagnosis not present

## 2022-11-16 DIAGNOSIS — C7802 Secondary malignant neoplasm of left lung: Secondary | ICD-10-CM | POA: Diagnosis not present

## 2022-11-16 DIAGNOSIS — F17210 Nicotine dependence, cigarettes, uncomplicated: Secondary | ICD-10-CM | POA: Diagnosis not present

## 2022-11-16 DIAGNOSIS — I447 Left bundle-branch block, unspecified: Secondary | ICD-10-CM | POA: Diagnosis present

## 2022-11-16 DIAGNOSIS — R0602 Shortness of breath: Secondary | ICD-10-CM | POA: Diagnosis not present

## 2022-11-16 DIAGNOSIS — Z90710 Acquired absence of both cervix and uterus: Secondary | ICD-10-CM

## 2022-11-16 DIAGNOSIS — D72829 Elevated white blood cell count, unspecified: Secondary | ICD-10-CM | POA: Diagnosis present

## 2022-11-16 DIAGNOSIS — I429 Cardiomyopathy, unspecified: Secondary | ICD-10-CM | POA: Diagnosis not present

## 2022-11-16 DIAGNOSIS — C3490 Malignant neoplasm of unspecified part of unspecified bronchus or lung: Secondary | ICD-10-CM | POA: Diagnosis not present

## 2022-11-16 DIAGNOSIS — I10 Essential (primary) hypertension: Secondary | ICD-10-CM | POA: Diagnosis present

## 2022-11-16 DIAGNOSIS — J432 Centrilobular emphysema: Secondary | ICD-10-CM | POA: Diagnosis present

## 2022-11-16 DIAGNOSIS — N179 Acute kidney failure, unspecified: Secondary | ICD-10-CM | POA: Diagnosis present

## 2022-11-16 DIAGNOSIS — I3139 Other pericardial effusion (noninflammatory): Secondary | ICD-10-CM | POA: Diagnosis present

## 2022-11-16 DIAGNOSIS — R079 Chest pain, unspecified: Secondary | ICD-10-CM | POA: Diagnosis not present

## 2022-11-16 DIAGNOSIS — I1310 Hypertensive heart and chronic kidney disease without heart failure, with stage 1 through stage 4 chronic kidney disease, or unspecified chronic kidney disease: Secondary | ICD-10-CM | POA: Diagnosis present

## 2022-11-16 DIAGNOSIS — N281 Cyst of kidney, acquired: Secondary | ICD-10-CM | POA: Diagnosis not present

## 2022-11-16 DIAGNOSIS — Z8041 Family history of malignant neoplasm of ovary: Secondary | ICD-10-CM | POA: Diagnosis not present

## 2022-11-16 DIAGNOSIS — E611 Iron deficiency: Secondary | ICD-10-CM | POA: Diagnosis not present

## 2022-11-16 HISTORY — DX: Other pulmonary embolism without acute cor pulmonale: I26.99

## 2022-11-16 HISTORY — DX: Tobacco use: Z72.0

## 2022-11-16 LAB — IRON AND TIBC
Iron: 15 ug/dL — ABNORMAL LOW (ref 28–170)
Saturation Ratios: 6 % — ABNORMAL LOW (ref 10.4–31.8)
TIBC: 244 ug/dL — ABNORMAL LOW (ref 250–450)
UIBC: 229 ug/dL

## 2022-11-16 LAB — BASIC METABOLIC PANEL
Anion gap: 9 (ref 5–15)
BUN: 23 mg/dL (ref 8–23)
CO2: 28 mmol/L (ref 22–32)
Calcium: 9.2 mg/dL (ref 8.9–10.3)
Chloride: 100 mmol/L (ref 98–111)
Creatinine, Ser: 1.26 mg/dL — ABNORMAL HIGH (ref 0.44–1.00)
GFR, Estimated: 46 mL/min — ABNORMAL LOW (ref >60)
Glucose, Bld: 113 mg/dL — ABNORMAL HIGH (ref 70–99)
Potassium: 3.9 mmol/L (ref 3.5–5.1)
Sodium: 137 mmol/L (ref 135–145)

## 2022-11-16 LAB — ECHOCARDIOGRAM COMPLETE
AR max vel: 1.57 cm2
AV Area VTI: 1.49 cm2
AV Area mean vel: 1.47 cm2
AV Mean grad: 8 mmHg
AV Peak grad: 16.6 mmHg
Ao pk vel: 2.04 m/s
Area-P 1/2: 9.25 cm2
Height: 64 in
MV M vel: 1.39 m/s
MV Peak grad: 7.7 mmHg
P 1/2 time: 442 msec
S' Lateral: 4 cm
Weight: 2240 oz

## 2022-11-16 LAB — CBC
HCT: 33 % — ABNORMAL LOW (ref 36.0–46.0)
Hemoglobin: 10.7 g/dL — ABNORMAL LOW (ref 12.0–15.0)
MCH: 29.1 pg (ref 26.0–34.0)
MCHC: 32.4 g/dL (ref 30.0–36.0)
MCV: 89.7 fL (ref 80.0–100.0)
Platelets: 373 10*3/uL (ref 150–400)
RBC: 3.68 MIL/uL — ABNORMAL LOW (ref 3.87–5.11)
RDW: 15.1 % (ref 11.5–15.5)
WBC: 11.2 10*3/uL — ABNORMAL HIGH (ref 4.0–10.5)
nRBC: 0 % (ref 0.0–0.2)

## 2022-11-16 LAB — TROPONIN I (HIGH SENSITIVITY)
Troponin I (High Sensitivity): 9 ng/L (ref <18)
Troponin I (High Sensitivity): 11 ng/L (ref <18)

## 2022-11-16 LAB — FERRITIN: Ferritin: 108 ng/mL (ref 11–307)

## 2022-11-16 LAB — VITAMIN B12: Vitamin B-12: 288 pg/mL (ref 180–914)

## 2022-11-16 LAB — FOLATE: Folate: 8.8 ng/mL (ref >5.9)

## 2022-11-16 LAB — HEPARIN LEVEL (UNFRACTIONATED): Heparin Unfractionated: 0.15 IU/mL — ABNORMAL LOW (ref 0.30–0.70)

## 2022-11-16 LAB — D-DIMER, QUANTITATIVE: D-Dimer, Quant: 3.14 ug/mL-FEU — ABNORMAL HIGH (ref 0.00–0.50)

## 2022-11-16 MED ORDER — HEPARIN (PORCINE) 25000 UT/250ML-% IV SOLN
1050.0000 [IU]/h | INTRAVENOUS | Status: DC
  Administered 2022-11-16: 1050 [IU]/h via INTRAVENOUS
  Filled 2022-11-16: qty 250

## 2022-11-16 MED ORDER — POLYETHYLENE GLYCOL 3350 17 G PO PACK
17.0000 g | PACK | Freq: Every day | ORAL | Status: DC | PRN
  Administered 2022-11-19 – 2022-11-25 (×2): 17 g via ORAL
  Filled 2022-11-16 (×2): qty 1

## 2022-11-16 MED ORDER — NICOTINE 21 MG/24HR TD PT24
21.0000 mg | MEDICATED_PATCH | Freq: Every day | TRANSDERMAL | Status: DC
  Administered 2022-11-16 – 2022-11-26 (×11): 21 mg via TRANSDERMAL
  Filled 2022-11-16 (×11): qty 1

## 2022-11-16 MED ORDER — ACETAMINOPHEN 500 MG PO TABS
1000.0000 mg | ORAL_TABLET | Freq: Once | ORAL | Status: AC
  Administered 2022-11-16: 1000 mg via ORAL
  Filled 2022-11-16: qty 2

## 2022-11-16 MED ORDER — OXYCODONE HCL 5 MG PO TABS
5.0000 mg | ORAL_TABLET | ORAL | Status: DC | PRN
  Administered 2022-11-16: 5 mg via ORAL
  Filled 2022-11-16: qty 1

## 2022-11-16 MED ORDER — SODIUM CHLORIDE 0.9% FLUSH
3.0000 mL | Freq: Two times a day (BID) | INTRAVENOUS | Status: DC
  Administered 2022-11-17 – 2022-11-25 (×13): 3 mL via INTRAVENOUS

## 2022-11-16 MED ORDER — ONDANSETRON HCL 4 MG PO TABS: 4.0000 mg | ORAL_TABLET | Freq: Four times a day (QID) | ORAL | Status: DC | PRN

## 2022-11-16 MED ORDER — SODIUM CHLORIDE 0.9% FLUSH: 3.0000 mL | INTRAVENOUS | Status: DC | PRN

## 2022-11-16 MED ORDER — BISACODYL 10 MG RE SUPP: 10.0000 mg | Freq: Every day | RECTAL | Status: DC | PRN

## 2022-11-16 MED ORDER — MOMETASONE FURO-FORMOTEROL FUM 200-5 MCG/ACT IN AERO
2.0000 | INHALATION_SPRAY | Freq: Two times a day (BID) | RESPIRATORY_TRACT | Status: DC
  Filled 2022-11-16: qty 8.8

## 2022-11-16 MED ORDER — ALPRAZOLAM 0.5 MG PO TABS: 0.5000 mg | ORAL_TABLET | Freq: Three times a day (TID) | ORAL | Status: DC | PRN

## 2022-11-16 MED ORDER — POLYVINYL ALCOHOL 1.4 % OP SOLN: 1.0000 [drp] | OPHTHALMIC | Status: DC | PRN

## 2022-11-16 MED ORDER — SODIUM CHLORIDE 0.9 % IV SOLN: INTRAVENOUS | Status: DC | PRN

## 2022-11-16 MED ORDER — TRAZODONE HCL 50 MG PO TABS: 50.0000 mg | ORAL_TABLET | Freq: Every evening | ORAL | Status: DC | PRN

## 2022-11-16 MED ORDER — HEPARIN BOLUS VIA INFUSION
3800.0000 [IU] | Freq: Once | INTRAVENOUS | Status: AC
  Administered 2022-11-16: 3800 [IU] via INTRAVENOUS

## 2022-11-16 MED ORDER — LIDOCAINE 5 % EX PTCH
1.0000 | MEDICATED_PATCH | CUTANEOUS | Status: DC
  Administered 2022-11-16 – 2022-11-20 (×4): 1 via TRANSDERMAL
  Filled 2022-11-16 (×7): qty 1

## 2022-11-16 MED ORDER — POLYETHYL GLYCOL-PROPYL GLYCOL 0.4-0.3 % OP GEL: Freq: Every day | OPHTHALMIC | Status: DC | PRN

## 2022-11-16 MED ORDER — ACETAMINOPHEN 325 MG PO TABS
650.0000 mg | ORAL_TABLET | Freq: Four times a day (QID) | ORAL | Status: DC | PRN
  Administered 2022-11-16 – 2022-11-25 (×7): 650 mg via ORAL
  Filled 2022-11-16 (×7): qty 2

## 2022-11-16 MED ORDER — ACETAMINOPHEN 650 MG RE SUPP: 650.0000 mg | Freq: Four times a day (QID) | RECTAL | Status: DC | PRN

## 2022-11-16 MED ORDER — ALBUTEROL SULFATE (2.5 MG/3ML) 0.083% IN NEBU: 2.5000 mg | INHALATION_SOLUTION | RESPIRATORY_TRACT | Status: DC | PRN

## 2022-11-16 MED ORDER — CHLORHEXIDINE GLUCONATE CLOTH 2 % EX PADS
6.0000 | MEDICATED_PAD | Freq: Every day | CUTANEOUS | Status: DC
  Administered 2022-11-17 – 2022-11-18 (×2): 6 via TOPICAL

## 2022-11-16 MED ORDER — SODIUM CHLORIDE 0.9% FLUSH
3.0000 mL | Freq: Two times a day (BID) | INTRAVENOUS | Status: DC
  Administered 2022-11-17 – 2022-11-25 (×12): 3 mL via INTRAVENOUS

## 2022-11-16 MED ORDER — ONDANSETRON HCL 4 MG/2ML IJ SOLN: 4.0000 mg | Freq: Four times a day (QID) | INTRAMUSCULAR | Status: DC | PRN

## 2022-11-16 MED ORDER — HEPARIN (PORCINE) 25000 UT/250ML-% IV SOLN
1700.0000 [IU]/h | INTRAVENOUS | Status: DC
  Administered 2022-11-17: 1300 [IU]/h via INTRAVENOUS
  Administered 2022-11-18: 1600 [IU]/h via INTRAVENOUS
  Administered 2022-11-18: 1450 [IU]/h via INTRAVENOUS
  Administered 2022-11-18: 1300 [IU]/h via INTRAVENOUS
  Administered 2022-11-19 – 2022-11-20 (×2): 1600 [IU]/h via INTRAVENOUS
  Administered 2022-11-20 – 2022-11-22 (×3): 1700 [IU]/h via INTRAVENOUS
  Filled 2022-11-16 (×9): qty 250

## 2022-11-16 MED ORDER — FENTANYL CITRATE PF 50 MCG/ML IJ SOSY: 25.0000 ug | PREFILLED_SYRINGE | INTRAMUSCULAR | Status: DC | PRN

## 2022-11-16 MED ORDER — HEPARIN BOLUS VIA INFUSION
1500.0000 [IU] | Freq: Once | INTRAVENOUS | Status: AC
  Administered 2022-11-16: 1500 [IU] via INTRAVENOUS
  Filled 2022-11-16: qty 1500

## 2022-11-16 MED ORDER — HYDRALAZINE HCL 20 MG/ML IJ SOLN: 10.0000 mg | Freq: Four times a day (QID) | INTRAMUSCULAR | Status: DC | PRN

## 2022-11-16 MED ORDER — AMLODIPINE BESYLATE 5 MG PO TABS
2.5000 mg | ORAL_TABLET | Freq: Every day | ORAL | Status: DC
  Administered 2022-11-16 – 2022-11-17 (×2): 2.5 mg via ORAL
  Filled 2022-11-16 (×2): qty 1

## 2022-11-16 MED ORDER — IOHEXOL 350 MG/ML SOLN
75.0000 mL | Freq: Once | INTRAVENOUS | Status: AC | PRN
  Administered 2022-11-16: 75 mL via INTRAVENOUS

## 2022-11-16 NOTE — ED Provider Notes (Signed)
[place] EMERGENCY DEPARTMENT AT [facility] Provider Note   CSN: [id number] Arrival date & time: 11/16/22  4098     History  Chief Complaint  Patient presents with   Abdominal Pain    Erin Pacheco is a 72 y.o. female.  72 year old female with a history of COPD, hypertension, and tobacco use who presents to the emergency department with left-sided chest discomfort.  Says that since last night has been having chest pain on her left breast.  Says that it both pleuritic and exertional.  Did report moving a chair last night and is unsure if she injured it then but the pain started several hours afterwards.  Does have a productive cough recently which appears to be her baseline.  Declines COVID and flu test at this time.  No diaphoresis or vomiting.  No leg swelling.  No personal history of PE, DVT, MI, or cancer.       Home Medications Prior to Admission medications   Medication Sig Start Date End Date Taking? Authorizing Provider  diclofenac (VOLTAREN) 75 MG EC tablet Take 75 mg by mouth 2 (two) times daily as needed for mild pain. 01/27/21  Yes [provider]  ibuprofen (ADVIL,MOTRIN) 200 MG tablet Take 400-600 mg by mouth every 6 (six) hours as needed. Pain    Yes [provider]  Multiple Vitamin (MULTIVITAMIN) tablet Take 1 tablet by mouth daily.     Yes [provider]  Polyethyl Glycol-Propyl Glycol (SYSTANE OP) Apply 1 drop to eye daily as needed (dry eye). Dry Eyes   Yes [provider]  valsartan-hydrochlorothiazide (DIOVAN-HCT) 160-12.5 MG tablet Take 1 tablet by mouth daily. 12/05/20  Yes [provider]  aliskiren (TEKTURNA) 150 MG tablet Take 150 mg by mouth daily.   Patient not taking: Reported on 11/16/2022    [provider]  dexamethasone (DECADRON) 4 MG tablet Take 4 mg by mouth daily as needed. For arthritis pain Patient not taking: Reported on 11/16/2022    [provider]  LORazepam  (ATIVAN) 1 MG tablet Take 1 tablet (1 mg total) by mouth 3 (three) times daily as needed for anxiety. Patient not taking: Reported on 11/16/2022 05/30/12   Donnetta Hutching, MD      Allergies    Patient has no known allergies.    Review of Systems   Review of Systems  Physical Exam Updated Vital Signs BP 137/69 (BP Location: Right Arm)   Pulse 87   Temp 99.4 F (37.4 C) (Oral)   Resp 20   Ht 5\' 4"  (1.626 m)   Wt 63.5 kg   SpO2 94%   BMI 24.03 kg/m  Physical Exam Vitals and nursing note reviewed.  Constitutional:      General: She is not in acute distress.    Appearance: She is well-developed.  HENT:     Head: Normocephalic and atraumatic.     Right Ear: External ear normal.     Left Ear: External ear normal.     Nose: Nose normal.  Eyes:     Extraocular Movements: Extraocular movements intact.     Conjunctiva/sclera: Conjunctivae normal.     Pupils: Pupils are equal, round, and reactive to light.  Cardiovascular:     Rate and Rhythm: Normal rate and regular rhythm.     Heart sounds: No murmur heard.    Comments: No rashes, and left chest wall.  Chest pain not reproducible Pulmonary:     Effort: Pulmonary effort is normal.  No respiratory distress.     Breath sounds: Normal breath sounds.  Musculoskeletal:     Cervical back: Normal range of motion and neck supple.     Right lower leg: No edema.     Left lower leg: No edema.  Skin:    General: Skin is warm and dry.  Neurological:     Mental Status: She is alert and oriented to person, place, and time. Mental status is at baseline.  Psychiatric:        Mood and Affect: Mood normal.     ED Results / Procedures / Treatments   Labs (all labs ordered are listed, but only abnormal results are displayed) Labs Reviewed  CBC - Abnormal; Notable for the following components:      Result Value   WBC 11.2 (*)    RBC 3.68 (*)    Hemoglobin 10.7 (*)    HCT 33.0 (*)    All other components within normal limits  BASIC  METABOLIC PANEL - Abnormal; Notable for the following components:   Glucose, Bld 113 (*)    Creatinine, Ser 1.26 (*)    GFR, Estimated 46 (*)    All other components within normal limits  D-DIMER, QUANTITATIVE - Abnormal; Notable for the following components:   D-Dimer, Quant 3.14 (*)    All other components within normal limits  IRON AND TIBC - Abnormal; Notable for the following components:   Iron 15 (*)    TIBC 244 (*)    Saturation Ratios 6 (*)    All other components within normal limits  HEPARIN LEVEL (UNFRACTIONATED) - Abnormal; Notable for the following components:   Heparin Unfractionated 0.15 (*)    All other components within normal limits  CBC - Abnormal; Notable for the following components:   WBC 11.4 (*)    RBC 3.44 (*)    Hemoglobin 10.0 (*)    HCT 30.4 (*)    All other components within normal limits  FERRITIN  VITAMIN B12  FOLATE  HEPARIN LEVEL (UNFRACTIONATED)  HEPARIN LEVEL (UNFRACTIONATED)  TROPONIN I (HIGH SENSITIVITY)  TROPONIN I (HIGH SENSITIVITY)    EKG None  Radiology ECHOCARDIOGRAM COMPLETE  Result Date: 11/16/2022    ECHOCARDIOGRAM REPORT   Patient Name:   Erin Pacheco Date of Exam: 11/16/2022 Medical Rec #:  [id number]        Height:       64.0 in Accession #:    [id number]       Weight:       140.0 lb Date of Birth:  [birth year]        BSA:          1.681 m Patient Age:    71 years         BP:           158/115 mmHg Patient Gender: F                HR:           78 bpm. [facility] Procedure: 2D Echo, Cardiac Doppler and Color Doppler Indications:    Pulmonary Embolus I26.09  History:        Patient has prior history of Echocardiogram examinations, most                 recent 11/16/2001. Signs/Symptoms:Murmur; Risk                 Factors:Hypertension.  Sonographer:    Aron Baba  Referring Phys: [id number] COURAGE EMOKPAE IMPRESSIONS  1. Left ventricular ejection fraction, by estimation, is 30 to 35%. The left ventricle has moderately  decreased function. The left ventricle demonstrates global hypokinesis. Left ventricular diastolic parameters are indeterminate.  2. Right ventricular systolic function is normal. The right ventricular size is normal.  3. Left atrial size was mildly dilated.  4. A small pericardial effusion is present. The pericardial effusion is circumferential.  5. The mitral valve is normal in structure. No evidence of mitral valve regurgitation. No evidence of mitral stenosis.  6. The aortic valve was not well visualized. Aortic valve regurgitation is mild to moderate. FINDINGS  Left Ventricle: Left ventricular ejection fraction, by estimation, is 30 to 35%. The left ventricle has moderately decreased function. The left ventricle demonstrates global hypokinesis. Definity contrast agent was given IV to delineate the left ventricular endocardial borders. The left ventricular internal cavity size was normal in size. There is no left ventricular hypertrophy. Left ventricular diastolic parameters are indeterminate. Right Ventricle: The right ventricular size is normal. Right vetricular wall thickness was not well visualized. Right ventricular systolic function is normal. Left Atrium: Left atrial size was mildly dilated. Right Atrium: Right atrial size was normal in size. Pericardium: A small pericardial effusion is present. The pericardial effusion is circumferential. Mitral Valve: The mitral valve is normal in structure. No evidence of mitral valve regurgitation. No evidence of mitral valve stenosis. Tricuspid Valve: The tricuspid valve is normal in structure. Tricuspid valve regurgitation is mild . No evidence of tricuspid stenosis. Aortic Valve: The aortic valve was not well visualized. Aortic valve regurgitation is mild to moderate. Aortic regurgitation PHT measures 442 msec. Aortic valve mean gradient measures 8.0 mmHg. Aortic valve peak gradient measures 16.6 mmHg. Aortic valve area, by VTI measures 1.49 cm. Pulmonic Valve:  The pulmonic valve was not well visualized. Pulmonic valve regurgitation is not visualized. No evidence of pulmonic stenosis. Aorta: The aortic root is normal in size and structure. IAS/Shunts: The interatrial septum was not well visualized.  LEFT VENTRICLE PLAX 2D LVIDd:         4.45 cm   Diastology LVIDs:         4.00 cm   LV e' medial:    4.99 cm/s LV PW:         1.20 cm   LV E/e' medial:  38.5 LV IVS:        0.90 cm   LV e' lateral:   5.43 cm/s LVOT diam:     2.00 cm   LV E/e' lateral: 35.4 LV SV:         53 LV SV Index:   32 LVOT Area:     3.14 cm  RIGHT VENTRICLE RV S prime:     13.10 cm/s TAPSE (M-mode): 1.6 cm LEFT ATRIUM           Index        RIGHT ATRIUM           Index LA diam:      2.70 cm 1.61 cm/m   RA Area:     14.00 cm LA Vol (A2C): 36.5 ml 21.71 ml/m  RA Volume:   34.70 ml  20.64 ml/m LA Vol (A4C): 58.3 ml 34.68 ml/m  AORTIC VALVE                     PULMONIC VALVE AV Area (Vmax):    1.57 cm      PR End Diast Vel:  5.20 msec AV Area (Vmean):   1.47 cm AV Area (VTI):     1.49 cm AV Vmax:           204.00 cm/s AV Vmean:          128.000 cm/s AV VTI:            0.357 m AV Peak Grad:      16.6 mmHg AV Mean Grad:      8.0 mmHg LVOT Vmax:         102.19 cm/s LVOT Vmean:        59.811 cm/s LVOT VTI:          0.170 m LVOT/AV VTI ratio: 0.48 AI PHT:            442 msec  AORTA Ao Root diam: 3.00 cm Ao Asc diam:  3.10 cm MITRAL VALVE                TRICUSPID VALVE MV Area (PHT): 9.25 cm     TR Peak grad:   23.8 mmHg MV Decel Time: 82 msec      TR Vmax:        244.00 cm/s MR Peak grad: 7.7 mmHg MR Vmax:      139.00 cm/s   SHUNTS MV E velocity: 192.00 cm/s  Systemic VTI:  0.17 m MV A velocity: 48.40 cm/s   Systemic Diam: 2.00 cm MV E/A ratio:  3.97 Dina Rich MD Electronically signed by Dina Rich MD Signature Date/Time: 11/16/2022/3:04:20 PM    Final    CT Angio Chest Pulmonary Embolism (PE) W or WO Contrast  Result Date: 11/16/2022 CLINICAL DATA:  Chest pain EXAM: CT ANGIOGRAPHY CHEST  WITH CONTRAST TECHNIQUE: Multidetector CT imaging of the chest was performed using the standard protocol during bolus administration of intravenous contrast. Multiplanar CT image reconstructions and MIPs were obtained to evaluate the vascular anatomy. RADIATION DOSE REDUCTION: This exam was performed according to the departmental dose-optimization program which includes automated exposure control, adjustment of the mA and/or kV according to patient size and/or use of iterative reconstruction technique. CONTRAST:  75mL OMNIPAQUE IOHEXOL 350 MG/ML SOLN COMPARISON:  X-ray 11/16/2022 FINDINGS: Cardiovascular: Heart is mildly enlarged. Small pericardial effusion. Coronary artery calcifications are seen. The thoracic aorta has a normal course and caliber with scattered atherosclerotic plaque. Multiple areas of pulmonary emboli identified most striking in segmental branches in the right upper lobe such as axial series 4, image 43. Additional subtle area in the anterior left lower lobe on image 54. No large or central embolus. No signs of right heart strain at this time. Mediastinum/Nodes: Patulous esophagus. Small thyroid gland. No specific abnormal lymph node enlargement identified in the axillary regions, hilum or mediastinum. Lungs/Pleura: Centrilobular emphysematous changes are seen greatest in the upper lung zones. There is spiculated left upper lobe mass near the margin of the hilum and mediastinum measuring on series 6, image 49 at 2.5 by 2.1 cm. There is some linear opacity lung bases likely scar or atelectasis. Subtle opacity as well in the left lower lobe. Trace left pleural fluid. No pneumothorax. Breathing motion. Additional nodule in the lingula on series 6 image 86 measuring 7 mm. Upper Abdomen: Right adrenal mass identified measuring 4.5 by 2.9 cm. Nodular thickening of the left adrenal gland as well. Musculoskeletal: Degenerative changes seen along the spine. Critical Value/emergent results were called by  telephone at the time of interpretation on 11/16/2022 at 7:48 am to provider Vonita Moss , who verbally acknowledged  these results. Review of the MIP images confirms the above findings. IMPRESSION: Segmental pulmonary emboli right upper lobe greater than left lower lobe. Small amount of clot burden. Mildly enlarged heart with a small pericardial effusion. 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 Separate 7 mm lingular lesion. Right adrenal mass identified at the edge of the imaging field. Malignant or metastatic lesion is possible. Recommend overall further evaluation. PET-CT scan may be useful as the next step in the workup versus sampling. Tiny left pleural effusion Aortic Atherosclerosis (ICD10-I70.0) and Emphysema (ICD10-J43.9). Electronically Signed   By: Karen Kays M.D.   On: 11/16/2022 10:52   DG Chest 2 View  Result Date: 11/16/2022 CLINICAL DATA:  chest pain EXAM: CHEST - 2 VIEW COMPARISON:  None Available. FINDINGS: No pleural effusion. No pneumothorax. No focal airspace opacity. Normal cardiac and mediastinal contours. No radiographically apparent displaced rib fractures. Visualized upper abdomen is unremarkable. Vertebral body heights are maintained. IMPRESSION: No focal airspace opacity. Electronically Signed   By: Lorenza Cambridge M.D.   On: 11/16/2022 09:21    Procedures Procedures    Medications Ordered in ED Medications  lidocaine (LIDODERM) 5 % 1 patch (1 patch Transdermal Patch Applied 11/17/22 0922)  Chlorhexidine Gluconate Cloth 2 % PADS 6 each (6 each Topical Given 11/17/22 0604)  nicotine (NICODERM CQ - dosed in mg/24 hours) patch 21 mg (21 mg Transdermal Patch Applied 11/17/22 0923)  hydrALAZINE (APRESOLINE) injection 10 mg (has no administration in time range)  ALPRAZolam (XANAX) tablet 0.5 mg (has no administration in time range)  albuterol (PROVENTIL) (2.5 MG/3ML) 0.083% nebulizer solution 2.5 mg (has no administration in  time range)  fentaNYL (SUBLIMAZE) injection 25 mcg (has no administration in time range)  oxyCODONE (Oxy IR/ROXICODONE) immediate release tablet 5 mg (5 mg Oral Given 11/16/22 1533)  amLODipine (NORVASC) tablet 2.5 mg (2.5 mg Oral Given 11/17/22 0924)  sodium chloride flush (NS) 0.9 % injection 3 mL (3 mLs Intravenous Given 11/17/22 1034)  sodium chloride flush (NS) 0.9 % injection 3 mL (3 mLs Intravenous Given 11/17/22 1033)  sodium chloride flush (NS) 0.9 % injection 3 mL (has no administration in time range)  0.9 %  sodium chloride infusion (has no administration in time range)  acetaminophen (TYLENOL) tablet 650 mg (650 mg Oral Given 11/17/22 0535)    Or  acetaminophen (TYLENOL) suppository 650 mg ( Rectal See Alternative 11/17/22 0535)  traZODone (DESYREL) tablet 50 mg (has no administration in time range)  polyethylene glycol (MIRALAX / GLYCOLAX) packet 17 g (has no administration in time range)  bisacodyl (DULCOLAX) suppository 10 mg (has no administration in time range)  ondansetron (ZOFRAN) tablet 4 mg (has no administration in time range)    Or  ondansetron (ZOFRAN) injection 4 mg (has no administration in time range)  mometasone-formoterol (DULERA) 200-5 MCG/ACT inhaler 2 puff (2 puffs Inhalation Not Given 11/17/22 0852)  polyvinyl alcohol (LIQUIFILM TEARS) 1.4 % ophthalmic solution 1 drop (has no administration in time range)  heparin ADULT infusion 100 units/mL (25000 units/280mL) (1,300 Units/hr Intravenous New Bag/Given 11/17/22 0527)  guaiFENesin (ROBITUSSIN) 100 MG/5ML liquid 5 mL (5 mLs Oral Given 11/17/22 0603)  acetaminophen (TYLENOL) tablet 1,000 mg (1,000 mg Oral Given 11/16/22 0911)  iohexol (OMNIPAQUE) 350 MG/ML injection 75 mL (75 mLs Intravenous Contrast Given 11/16/22 1031)  heparin bolus via infusion 3,800 Units (3,800 Units Intravenous Bolus from Bag 11/16/22 1208)  heparin bolus via infusion 1,500 Units (1,500 Units Intravenous Bolus  from Bag 11/16/22 2208)    ED Course/  Medical Decision Making/ A&P Clinical Course as of 11/17/22 1042  Wed Nov 16, 2022  0941 Creatinine(!): 1.26 Baseline of 1.2 [RP]  0941 Hemoglobin(!): 10.7 Baseline 14 [RP]  1050 Called by radiology has RUL and LLL segmental PE and LUL lung mass with adrenal mets.  [RP]  1224 Discussed with Dr Mariea Clonts [RP]    Clinical Course User Index [RP] Rondel Baton, MD                             Medical Decision Making Amount and/or Complexity of Data Reviewed Labs: ordered. Decision-making details documented in ED Course. Radiology: ordered.  Risk OTC drugs. Prescription drug management. Decision regarding hospitalization.   FREDI WESTMARK is a 72 y.o. female with comorbidities that complicate the patient evaluation including COPD, hypertension, and tobacco use who presents to the emergency department with left-sided chest discomfort.    Initial Ddx:  PE, muscle strain, pleurisy, pneumonia, URI  MDM/Course:  Initially was concerned about possible muscle strain of pleurisy causing the patient's symptoms.  Has had that cough recently so obtain a chest x-ray to assess for any infectious causes which were unremarkable.  Did send a D-dimer as well which was elevated and the patient underwent a CTA which showed that she has a lung mass concerning for cancer as well as segmental pulmonary emboli which are likely causing her symptoms.  The patient and her daughter were updated.  They are aware that this is likely cancer.  Upon re-evaluation stable on room air and was started on heparin and admitted to medicine for further management.  CT scan did show possible adrenal mets and hospitalist were notified of this finding.  This patient presents to the ED for concern of complaints listed in HPI, this involves an extensive number of treatment options, and is a complaint that carries with it a high risk of complications and morbidity. Disposition including potential need for admission considered.    Dispo: Admit to Floor  Additional history obtained from daughter Records reviewed Outpatient Clinic Notes The following labs were independently interpreted: D-dimer and show  elevation concerning for PE I independently reviewed the following imaging with scope of interpretation limited to determining acute life threatening conditions related to emergency care: Chest x-ray and agree with the radiologist interpretation with the following exceptions: none I personally reviewed and interpreted cardiac monitoring: normal sinus rhythm  I personally reviewed and interpreted the pt's EKG: see above for interpretation  I have reviewed the patients home medications and made adjustments as needed Consults: Hospitalist Social Determinants of health:  Elderly         Final Clinical Impression(s) / ED Diagnoses Final diagnoses:  Multiple subsegmental pulmonary emboli without acute cor pulmonale (HCC)  Lung mass    Rx / DC Orders ED Discharge Orders     None      CRITICAL CARE Performed by: Rondel Baton   Total critical care time: 30 minutes  Critical care time was exclusive of separately billable procedures and treating other patients.  Critical care was necessary to treat or prevent imminent or life-threatening deterioration.  Critical care was time spent personally by me on the following activities: development of treatment plan with patient and/or surrogate as well as nursing, discussions with consultants, evaluation of patient's response to treatment, examination of patient, obtaining history from patient or surrogate, ordering and performing treatments and  interventions, ordering and review of laboratory studies, ordering and review of radiographic studies, pulse oximetry and re-evaluation of patient's condition.    Rondel Baton, MD 11/17/22 1043

## 2022-11-16 NOTE — ED Triage Notes (Signed)
Pt complains of abd pain at 4/10 under left breast X 1 day intermittently.  Pt reports when she sucks on a cough drop the pain goes away. Pt reports she has been coughing up mucus which is her baseline. Pt denies any trouble breathing or any recent sickness. Pt denies any injury or unusual activity.

## 2022-11-16 NOTE — Progress Notes (Signed)
ANTICOAGULATION CONSULT NOTE - Initial Consult  Pharmacy Consult for Heparin Indication: pulmonary embolus  No Known Allergies  Patient Measurements: Height: 5\' 4"  (162.6 cm) Weight: 63.5 kg (140 lb) IBW/kg (Calculated) : 54.7 HEPARIN DW (KG): 63.5   Vital Signs: Temp: 98.2 F (36.8 C) (06/12 0827) Temp Source: Oral (06/12 0827) BP: 135/75 (06/12 0827) Pulse Rate: 86 (06/12 0827)  Labs: Recent Labs    11/16/22 0849  HGB 10.7*  HCT 33.0*  PLT 373  CREATININE 1.26*  TROPONINIHS 9    Estimated Creatinine Clearance: 35.4 mL/min (A) (by C-G formula based on SCr of 1.26 mg/dL (H)).   Medical History: Past Medical History:  Diagnosis Date   Arthritis    Heart murmur    Hypertension     Medications:  See home meds  Assessment: Patient present to ED with left sides chest discomfort and abdominal pain. CTA shows segmental pulmonary emboli right upper lobe greater than left lower lobe. Small amount of clot burden. No history of po anticoagulants.    Goal of Therapy:  Heparin level 0.3-0.7 units/ml Monitor platelets by anticoagulation protocol: Yes   Plan:  Give 3800 units bolus x 1 Start heparin infusion at 1050 units/hr Check anti-Xa level in ~8 hours and daily while on heparin Continue to monitor H&H and platelets  Elder Cyphers, BS Pharm D, BCPS Clinical Pharmacist 11/16/2022,11:05 AM

## 2022-11-16 NOTE — H&P (Addendum)
Patient Demographics:    Erin Pacheco, is a 72 y.o. female  MRN: [id number]   DOB - 06-23-1950  Admit Date - 11/16/2022  Outpatient Primary MD for the patient is Erin Rakers, MD   Assessment & Plan:   Problem  Acute Pulmonary Embolism (Hcc)  Mass of Left Lung  Adrenal Mass, Right (Hcc)  HFrEF (heart failure with reduced ejection fraction) /EF 30 to 35 %  Htn (Hypertension)  Smoker     Assessment and Plan: 1)Acute Pulm Embolism---POA -Left-sided inframammary area chest pain -No hypoxia Lower extremity venous Dopplers requested and pending -Echo with EF of 30 to 35% with global hypokinesis, mild to moderate aortic regurgitation, no mitral stenosis -D-dimer is elevated at 3.14, CTA chest with segmental pulmonary emboli in the right upper lobe greater than the left lower lobe, small amount of clot burden -IV heparin as ordered please do Not transition to DOAC until decision on possible biopsies has been made -She will need lifelong anticoagulation if malignancy is confirmed as noted below in #2  2)Possible left lung and right adrenal malignancy-- -Spiculated left upper lobe mass measuring 2.5 cm noted -Right adrenal mass noted --Please consult IR for possible biopsy after patient has been on IV heparin for at least 48 hours--  3)AKI----acute kidney injury Vs CKD----??? undetermined at this time --- On admission creatinine 1.26, GFR 46--- no recent renal function available --- renally adjust medications, avoid nephrotoxic agents / dehydration  / hypotension  4)Social/Ethics----Discussed with pt and  daughters  Erin Pacheco and Erin Pacheco , grandson Erin Pacheco and grand daughter Erin Pacheco - -patient remains a full code  5)Anemia--Hgb 10.7-----???? acute Versus chronic -No recent Hgb value available -Check iron studies,  ferritin, B12 folate and stool for occult blood  6)HFrEF--systolic dysfunction CHF unclear if acute versus chronic no prior EF/echo available -EKG requested and pending -Initial troponin is 9, repeat troponin is 11 -Lower extremity venous Dopplers requested and pending -Echo with EF of 30 to 35% with global hypokinesis, mild to moderate aortic regurgitation, no mitral stenosis - 7)HTN--- hold valsartan due to contrast study -Start amlodipine 2.5 mg daily -IV hydralazine as needed elevated BP  8)Tobacco Abuse/COPD--- patient with baseline cough -Smoking cessation advised -Nicotine patch as ordered -Bronchodilators as ordered -Hold off on steroids  Status is: Inpatient  Remains inpatient appropriate because:   Dispo: The patient is from: Home              Anticipated d/c is to: Home              Anticipated d/c date is: 3 days              Patient currently is not medically stable to d/c. Barriers: Not Clinically Stable-   With History of - Reviewed by me  Past Medical History:  Diagnosis Date   Arthritis    Heart murmur    Hypertension      Past Surgical History:  Procedure Laterality Date  ABDOMINAL HYSTERECTOMY     BILATERAL OOPHORECTOMY     CATARACT EXTRACTION W/PHACO  12/27/2010   Procedure: CATARACT EXTRACTION PHACO AND INTRAOCULAR LENS PLACEMENT (IOC);  Surgeon: Gemma Payor;  Location: [facility];  Service: Ophthalmology;  Laterality: Right;   CATARACT EXTRACTION W/PHACO  03/28/2011   Procedure: CATARACT EXTRACTION PHACO AND INTRAOCULAR LENS PLACEMENT (IOC);  Surgeon: Gemma Payor;  Location: [facility];  Service: Ophthalmology;  Laterality: Left;  CDE 7.27   Chief Complaint  Patient presents with   Abdominal Pain     HPI:    Erin Pacheco  is a 72 y.o. female with past medical history relevant for HTN and tobacco abuse presents to the ED with left inframamillary area pain-x 1 day - Patient reports productive cough with clear sputum which is baseline No fever  Or  chills   No Nausea, Vomiting or Diarrhea - -Additional history obtained at bedside from daughters  Erin Pacheco and Erin Pacheco , grandson Erin Pacheco and grand daughter Erin Pacheco  - EKG requested and pending -Initial troponin is 9, repeat troponin is 11 -Lower extremity venous Dopplers requested and pending -Echo with EF of 30 to 35% with global hypokinesis, mild to moderate aortic regurgitation, no mitral stenosis -D-dimer is elevated at 3.14, CTA chest with segmental pulmonary emboli in the right upper lobe greater than the left lower lobe, small amount of clot burden -Spiculated left upper lobe mass measuring 2.5 cm noted -Right adrenal mass noted -Creatinine 1.26, GFR 46--- no recent renal function available -WBC 11.2, Hgb 10.7, platelets 373   Review of systems:    In addition to the HPI above,  A full Review of  Systems was done, all other systems reviewed are negative except as noted above in HPI , .   Social History:  Reviewed by me    Social History   Tobacco Use   Smoking status: Every Day    Packs/day: 0.50    Years: 30.00    Additional pack years: 0.00    Total pack years: 15.00    Types: Cigarettes   Smokeless tobacco: Never  Substance Use Topics   Alcohol use: No     Family History :  Reviewed by me    Family History  Problem Relation Age of Onset   Anesthesia problems Neg Hx    Hypotension Neg Hx    Malignant hyperthermia Neg Hx    Pseudochol deficiency Neg Hx     Home Medications:   Prior to Admission medications   Medication Sig Start Date End Date Taking? Authorizing Provider  diclofenac (VOLTAREN) 75 MG EC tablet Take 75 mg by mouth 2 (two) times daily as needed for mild pain. 01/27/21  Yes [provider]  ibuprofen (ADVIL,MOTRIN) 200 MG tablet Take 400-600 mg by mouth every 6 (six) hours as needed. Pain    Yes [provider]  Multiple Vitamin (MULTIVITAMIN) tablet Take 1 tablet by mouth daily.     Yes [provider]   Polyethyl Glycol-Propyl Glycol (SYSTANE OP) Apply 1 drop to eye daily as needed (dry eye). Dry Eyes   Yes [provider]  valsartan-hydrochlorothiazide (DIOVAN-HCT) 160-12.5 MG tablet Take 1 tablet by mouth daily. 12/05/20  Yes [provider]  aliskiren (TEKTURNA) 150 MG tablet Take 150 mg by mouth daily.   Patient not taking: Reported on 11/16/2022    [provider]  dexamethasone (DECADRON) 4 MG tablet Take 4 mg by mouth daily as needed. For arthritis pain Patient not taking: Reported on 11/16/2022  [provider]  LORazepam (ATIVAN) 1 MG tablet Take 1 tablet (1 mg total) by mouth 3 (three) times daily as needed for anxiety. Patient not taking: Reported on 11/16/2022 05/30/12   Donnetta Hutching, MD     Allergies:    No Known Allergies   Physical Exam:   Vitals  Blood pressure 126/73, pulse 89, temperature 98.3 F (36.8 C), temperature source Oral, resp. rate (!) 25, height 5\' 4"  (1.626 m), weight 63.5 kg, SpO2 91 %.  Physical Examination: General appearance - alert,  in no distress  Mental status - alert, oriented to person, place, and time,  Eyes - sclera anicteric Neck - supple, no JVD elevation , Chest - clear  to auscultation bilaterally, symmetrical air movement,  Heart - S1 and S2 normal, regular  Abdomen - soft, nontender, nondistended, +BS Neurological - screening mental status exam normal, neck supple without rigidity, cranial nerves II through XII intact, DTR's normal and symmetric Extremities - no pedal edema noted, intact peripheral pulses  Skin - warm, dry    Data Review:    CBC Recent Labs  Lab 11/16/22 0849  WBC 11.2*  HGB 10.7*  HCT 33.0*  PLT 373  MCV 89.7  MCH 29.1  MCHC 32.4  RDW 15.1   ------------------------------------------------------------------------------------------------------------------  Chemistries  Recent Labs  Lab 11/16/22 0849  NA 137  K 3.9  CL 100  CO2 28  GLUCOSE 113*  BUN 23   CREATININE 1.26*  CALCIUM 9.2   ------------------------------------------------------------------------------------------------------------------ estimated creatinine clearance is 35.4 mL/min (A) (by C-G formula based on SCr of 1.26 mg/dL (H)). ------------------------------------------------------------------------------------------------------------------  Recent Labs    11/16/22 0849  DDIMER 3.14*    Imaging Results:    ECHOCARDIOGRAM COMPLETE  Result Date: 11/16/2022    ECHOCARDIOGRAM REPORT   Patient Name:   SIANY SORCE Date of Exam: 11/16/2022 Medical Rec #:  [id number]        Height:       64.0 in Accession #:    [id number]       Weight:       140.0 lb Date of Birth:  1950-07-18        BSA:          1.681 m Patient Age:    71 years         BP:           158/115 mmHg Patient Gender: F                HR:           78 bpm. [facility] Procedure: 2D Echo, Cardiac Doppler and Color Doppler Indications:    Pulmonary Embolus I26.09  History:        Patient has prior history of Echocardiogram examinations, most                 recent 11/16/2001. Signs/Symptoms:Murmur; Risk                 Factors:Hypertension.  Sonographer:    Aron Baba Referring Phys: [id number] Hadia Minier IMPRESSIONS  1. Left ventricular ejection fraction, by estimation, is 30 to 35%. The left ventricle has moderately decreased function. The left ventricle demonstrates global hypokinesis. Left ventricular diastolic parameters are indeterminate.  2. Right ventricular systolic function is normal. The right ventricular size is normal.  3. Left atrial size was mildly dilated.  4. A small pericardial effusion is present. The pericardial effusion is circumferential.  5. The mitral  valve is normal in structure. No evidence of mitral valve regurgitation. No evidence of mitral stenosis.  6. The aortic valve was not well visualized. Aortic valve regurgitation is mild to moderate. FINDINGS  Left Ventricle: Left  ventricular ejection fraction, by estimation, is 30 to 35%. The left ventricle has moderately decreased function. The left ventricle demonstrates global hypokinesis. Definity contrast agent was given IV to delineate the left ventricular endocardial borders. The left ventricular internal cavity size was normal in size. There is no left ventricular hypertrophy. Left ventricular diastolic parameters are indeterminate. Right Ventricle: The right ventricular size is normal. Right vetricular wall thickness was not well visualized. Right ventricular systolic function is normal. Left Atrium: Left atrial size was mildly dilated. Right Atrium: Right atrial size was normal in size. Pericardium: A small pericardial effusion is present. The pericardial effusion is circumferential. Mitral Valve: The mitral valve is normal in structure. No evidence of mitral valve regurgitation. No evidence of mitral valve stenosis. Tricuspid Valve: The tricuspid valve is normal in structure. Tricuspid valve regurgitation is mild . No evidence of tricuspid stenosis. Aortic Valve: The aortic valve was not well visualized. Aortic valve regurgitation is mild to moderate. Aortic regurgitation PHT measures 442 msec. Aortic valve mean gradient measures 8.0 mmHg. Aortic valve peak gradient measures 16.6 mmHg. Aortic valve area, by VTI measures 1.49 cm. Pulmonic Valve: The pulmonic valve was not well visualized. Pulmonic valve regurgitation is not visualized. No evidence of pulmonic stenosis. Aorta: The aortic root is normal in size and structure. IAS/Shunts: The interatrial septum was not well visualized.  LEFT VENTRICLE PLAX 2D LVIDd:         4.45 cm   Diastology LVIDs:         4.00 cm   LV e' medial:    4.99 cm/s LV PW:         1.20 cm   LV E/e' medial:  38.5 LV IVS:        0.90 cm   LV e' lateral:   5.43 cm/s LVOT diam:     2.00 cm   LV E/e' lateral: 35.4 LV SV:         53 LV SV Index:   32 LVOT Area:     3.14 cm  RIGHT VENTRICLE RV S prime:      13.10 cm/s TAPSE (M-mode): 1.6 cm LEFT ATRIUM           Index        RIGHT ATRIUM           Index LA diam:      2.70 cm 1.61 cm/m   RA Area:     14.00 cm LA Vol (A2C): 36.5 ml 21.71 ml/m  RA Volume:   34.70 ml  20.64 ml/m LA Vol (A4C): 58.3 ml 34.68 ml/m  AORTIC VALVE                     PULMONIC VALVE AV Area (Vmax):    1.57 cm      PR End Diast Vel: 5.20 msec AV Area (Vmean):   1.47 cm AV Area (VTI):     1.49 cm AV Vmax:           204.00 cm/s AV Vmean:          128.000 cm/s AV VTI:            0.357 m AV Peak Grad:      16.6 mmHg AV Mean Grad:  8.0 mmHg LVOT Vmax:         102.19 cm/s LVOT Vmean:        59.811 cm/s LVOT VTI:          0.170 m LVOT/AV VTI ratio: 0.48 AI PHT:            442 msec  AORTA Ao Root diam: 3.00 cm Ao Asc diam:  3.10 cm MITRAL VALVE                TRICUSPID VALVE MV Area (PHT): 9.25 cm     TR Peak grad:   23.8 mmHg MV Decel Time: 82 msec      TR Vmax:        244.00 cm/s MR Peak grad: 7.7 mmHg MR Vmax:      139.00 cm/s   SHUNTS MV E velocity: 192.00 cm/s  Systemic VTI:  0.17 m MV A velocity: 48.40 cm/s   Systemic Diam: 2.00 cm MV E/A ratio:  3.97 Dina Rich MD Electronically signed by Dina Rich MD Signature Date/Time: 11/16/2022/3:04:20 PM    Final    CT Angio Chest Pulmonary Embolism (PE) W or WO Contrast  Result Date: 11/16/2022 CLINICAL DATA:  Chest pain EXAM: CT ANGIOGRAPHY CHEST WITH CONTRAST TECHNIQUE: Multidetector CT imaging of the chest was performed using the standard protocol during bolus administration of intravenous contrast. Multiplanar CT image reconstructions and MIPs were obtained to evaluate the vascular anatomy. RADIATION DOSE REDUCTION: This exam was performed according to the departmental dose-optimization program which includes automated exposure control, adjustment of the mA and/or kV according to patient size and/or use of iterative reconstruction technique. CONTRAST:  75mL OMNIPAQUE IOHEXOL 350 MG/ML SOLN COMPARISON:  X-ray 11/16/2022  FINDINGS: Cardiovascular: Heart is mildly enlarged. Small pericardial effusion. Coronary artery calcifications are seen. The thoracic aorta has a normal course and caliber with scattered atherosclerotic plaque. Multiple areas of pulmonary emboli identified most striking in segmental branches in the right upper lobe such as axial series 4, image 43. Additional subtle area in the anterior left lower lobe on image 54. No large or central embolus. No signs of right heart strain at this time. Mediastinum/Nodes: Patulous esophagus. Small thyroid gland. No specific abnormal lymph node enlargement identified in the axillary regions, hilum or mediastinum. Lungs/Pleura: Centrilobular emphysematous changes are seen greatest in the upper lung zones. There is spiculated left upper lobe mass near the margin of the hilum and mediastinum measuring on series 6, image 49 at 2.5 by 2.1 cm. There is some linear opacity lung bases likely scar or atelectasis. Subtle opacity as well in the left lower lobe. Trace left pleural fluid. No pneumothorax. Breathing motion. Additional nodule in the lingula on series 6 image 86 measuring 7 mm. Upper Abdomen: Right adrenal mass identified measuring 4.5 by 2.9 cm. Nodular thickening of the left adrenal gland as well. Musculoskeletal: Degenerative changes seen along the spine. Critical Value/emergent results were called by telephone at the time of interpretation on 11/16/2022 at 7:48 am to provider Vonita Moss , who verbally acknowledged these results. Review of the MIP images confirms the above findings. IMPRESSION: Segmental pulmonary emboli right upper lobe greater than left lower lobe. Small amount of clot burden. Mildly enlarged heart with a small pericardial effusion. 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 Separate 7 mm lingular lesion. Right adrenal mass identified at the edge of the imaging field. Malignant or metastatic lesion is  possible. Recommend overall further evaluation. PET-CT  scan may be useful as the next step in the workup versus sampling. Tiny left pleural effusion Aortic Atherosclerosis (ICD10-I70.0) and Emphysema (ICD10-J43.9). Electronically Signed   By: Karen Kays M.D.   On: 11/16/2022 10:52   DG Chest 2 View  Result Date: 11/16/2022 CLINICAL DATA:  chest pain EXAM: CHEST - 2 VIEW COMPARISON:  None Available. FINDINGS: No pleural effusion. No pneumothorax. No focal airspace opacity. Normal cardiac and mediastinal contours. No radiographically apparent displaced rib fractures. Visualized upper abdomen is unremarkable. Vertebral body heights are maintained. IMPRESSION: No focal airspace opacity. Electronically Signed   By: Lorenza Cambridge M.D.   On: 11/16/2022 09:21    Radiological Exams on Admission: ECHOCARDIOGRAM COMPLETE  Result Date: 11/16/2022    ECHOCARDIOGRAM REPORT   Patient Name:   GEARLDEAN MCCURTY Date of Exam: 11/16/2022 Medical Rec #:  [id number]        Height:       64.0 in Accession #:    [id number]       Weight:       140.0 lb Date of Birth:  04-03-1951        BSA:          1.681 m Patient Age:    71 years         BP:           158/115 mmHg Patient Gender: F                HR:           78 bpm. [facility] Location[facility] Procedure: 2D Echo, Cardiac Doppler and Color Doppler Indications:    Pulmonary Embolus I26.09  History:        Patient has prior history of Echocardiogram examinations, most                 recent 11/16/2001. Signs/Symptoms:Murmur; Risk                 Factors:Hypertension.  Sonographer:    Aron Baba Referring Phys: [id number] Keishawn Rajewski IMPRESSIONS  1. Left ventricular ejection fraction, by estimation, is 30 to 35%. The left ventricle has moderately decreased function. The left ventricle demonstrates global hypokinesis. Left ventricular diastolic parameters are indeterminate.  2. Right ventricular systolic function is normal. The right ventricular size is normal.  3. Left atrial size  was mildly dilated.  4. A small pericardial effusion is present. The pericardial effusion is circumferential.  5. The mitral valve is normal in structure. No evidence of mitral valve regurgitation. No evidence of mitral stenosis.  6. The aortic valve was not well visualized. Aortic valve regurgitation is mild to moderate. FINDINGS  Left Ventricle: Left ventricular ejection fraction, by estimation, is 30 to 35%. The left ventricle has moderately decreased function. The left ventricle demonstrates global hypokinesis. Definity contrast agent was given IV to delineate the left ventricular endocardial borders. The left ventricular internal cavity size was normal in size. There is no left ventricular hypertrophy. Left ventricular diastolic parameters are indeterminate. Right Ventricle: The right ventricular size is normal. Right vetricular wall thickness was not well visualized. Right ventricular systolic function is normal. Left Atrium: Left atrial size was mildly dilated. Right Atrium: Right atrial size was normal in size. Pericardium: A small pericardial effusion is present. The pericardial effusion is circumferential. Mitral Valve: The mitral valve is normal in structure. No evidence of mitral valve regurgitation. No evidence of mitral valve stenosis. Tricuspid Valve: The tricuspid valve is normal in structure.  Tricuspid valve regurgitation is mild . No evidence of tricuspid stenosis. Aortic Valve: The aortic valve was not well visualized. Aortic valve regurgitation is mild to moderate. Aortic regurgitation PHT measures 442 msec. Aortic valve mean gradient measures 8.0 mmHg. Aortic valve peak gradient measures 16.6 mmHg. Aortic valve area, by VTI measures 1.49 cm. Pulmonic Valve: The pulmonic valve was not well visualized. Pulmonic valve regurgitation is not visualized. No evidence of pulmonic stenosis. Aorta: The aortic root is normal in size and structure. IAS/Shunts: The interatrial septum was not well visualized.   LEFT VENTRICLE PLAX 2D LVIDd:         4.45 cm   Diastology LVIDs:         4.00 cm   LV e' medial:    4.99 cm/s LV PW:         1.20 cm   LV E/e' medial:  38.5 LV IVS:        0.90 cm   LV e' lateral:   5.43 cm/s LVOT diam:     2.00 cm   LV E/e' lateral: 35.4 LV SV:         53 LV SV Index:   32 LVOT Area:     3.14 cm  RIGHT VENTRICLE RV S prime:     13.10 cm/s TAPSE (M-mode): 1.6 cm LEFT ATRIUM           Index        RIGHT ATRIUM           Index LA diam:      2.70 cm 1.61 cm/m   RA Area:     14.00 cm LA Vol (A2C): 36.5 ml 21.71 ml/m  RA Volume:   34.70 ml  20.64 ml/m LA Vol (A4C): 58.3 ml 34.68 ml/m  AORTIC VALVE                     PULMONIC VALVE AV Area (Vmax):    1.57 cm      PR End Diast Vel: 5.20 msec AV Area (Vmean):   1.47 cm AV Area (VTI):     1.49 cm AV Vmax:           204.00 cm/s AV Vmean:          128.000 cm/s AV VTI:            0.357 m AV Peak Grad:      16.6 mmHg AV Mean Grad:      8.0 mmHg LVOT Vmax:         102.19 cm/s LVOT Vmean:        59.811 cm/s LVOT VTI:          0.170 m LVOT/AV VTI ratio: 0.48 AI PHT:            442 msec  AORTA Ao Root diam: 3.00 cm Ao Asc diam:  3.10 cm MITRAL VALVE                TRICUSPID VALVE MV Area (PHT): 9.25 cm     TR Peak grad:   23.8 mmHg MV Decel Time: 82 msec      TR Vmax:        244.00 cm/s MR Peak grad: 7.7 mmHg MR Vmax:      139.00 cm/s   SHUNTS MV E velocity: 192.00 cm/s  Systemic VTI:  0.17 m MV A velocity: 48.40 cm/s   Systemic Diam: 2.00 cm MV E/A ratio:  3.97 Dina Rich MD Electronically  signed by Dina Rich MD Signature Date/Time: 11/16/2022/3:04:20 PM    Final    CT Angio Chest Pulmonary Embolism (PE) W or WO Contrast  Result Date: 11/16/2022 CLINICAL DATA:  Chest pain EXAM: CT ANGIOGRAPHY CHEST WITH CONTRAST TECHNIQUE: Multidetector CT imaging of the chest was performed using the standard protocol during bolus administration of intravenous contrast. Multiplanar CT image reconstructions and MIPs were obtained to evaluate the vascular  anatomy. RADIATION DOSE REDUCTION: This exam was performed according to the departmental dose-optimization program which includes automated exposure control, adjustment of the mA and/or kV according to patient size and/or use of iterative reconstruction technique. CONTRAST:  75mL OMNIPAQUE IOHEXOL 350 MG/ML SOLN COMPARISON:  X-ray 11/16/2022 FINDINGS: Cardiovascular: Heart is mildly enlarged. Small pericardial effusion. Coronary artery calcifications are seen. The thoracic aorta has a normal course and caliber with scattered atherosclerotic plaque. Multiple areas of pulmonary emboli identified most striking in segmental branches in the right upper lobe such as axial series 4, image 43. Additional subtle area in the anterior left lower lobe on image 54. No large or central embolus. No signs of right heart strain at this time. Mediastinum/Nodes: Patulous esophagus. Small thyroid gland. No specific abnormal lymph node enlargement identified in the axillary regions, hilum or mediastinum. Lungs/Pleura: Centrilobular emphysematous changes are seen greatest in the upper lung zones. There is spiculated left upper lobe mass near the margin of the hilum and mediastinum measuring on series 6, image 49 at 2.5 by 2.1 cm. There is some linear opacity lung bases likely scar or atelectasis. Subtle opacity as well in the left lower lobe. Trace left pleural fluid. No pneumothorax. Breathing motion. Additional nodule in the lingula on series 6 image 86 measuring 7 mm. Upper Abdomen: Right adrenal mass identified measuring 4.5 by 2.9 cm. Nodular thickening of the left adrenal gland as well. Musculoskeletal: Degenerative changes seen along the spine. Critical Value/emergent results were called by telephone at the time of interpretation on 11/16/2022 at 7:48 am to provider Vonita Moss , who verbally acknowledged these results. Review of the MIP images confirms the above findings. IMPRESSION: Segmental pulmonary emboli right upper lobe  greater than left lower lobe. Small amount of clot burden. Mildly enlarged heart with a small pericardial effusion. 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 Separate 7 mm lingular lesion. Right adrenal mass identified at the edge of the imaging field. Malignant or metastatic lesion is possible. Recommend overall further evaluation. PET-CT scan may be useful as the next step in the workup versus sampling. Tiny left pleural effusion Aortic Atherosclerosis (ICD10-I70.0) and Emphysema (ICD10-J43.9). Electronically Signed   By: Karen Kays M.D.   On: 11/16/2022 10:52   DG Chest 2 View  Result Date: 11/16/2022 CLINICAL DATA:  chest pain EXAM: CHEST - 2 VIEW COMPARISON:  None Available. FINDINGS: No pleural effusion. No pneumothorax. No focal airspace opacity. Normal cardiac and mediastinal contours. No radiographically apparent displaced rib fractures. Visualized upper abdomen is unremarkable. Vertebral body heights are maintained. IMPRESSION: No focal airspace opacity. Electronically Signed   By: Lorenza Cambridge M.D.   On: 11/16/2022 09:21    DVT Prophylaxis -iv Heparin AM Labs Ordered, also please review Full Orders  Family Communication: Admission, patients condition and plan of care including tests being ordered have been discussed with the patient and daughters  Erin Pacheco and Erin Pacheco , grandson Erin Pacheco and grand daughter Erin Pacheco who indicate understanding and agree with the plan   Condition   stable  Shon Hale M.D on 11/16/2022 at 5:22 PM Go to [email or web address] -  for contact info  [facility] - Office  [phone]

## 2022-11-16 NOTE — Progress Notes (Signed)
-   Patient and family request transfer to [facility] rather than [facility] - Orders updated for admission to [facility], MD

## 2022-11-16 NOTE — Progress Notes (Signed)
ANTICOAGULATION CONSULT NOTE - follow up  Pharmacy Consult for Heparin Indication: pulmonary embolus  No Known Allergies  Patient Measurements: Height: 5\' 4"  (162.6 cm) Weight: 63.5 kg (140 lb) IBW/kg (Calculated) : 54.7 HEPARIN DW (KG): 63.5   Vital Signs: Temp: 99.6 F (37.6 C) (06/12 2130) Temp Source: Oral (06/12 1248) BP: 123/65 (06/12 2130) Pulse Rate: 86 (06/12 2130)  Labs: Recent Labs    11/16/22 0849 11/16/22 1238 11/16/22 2048  HGB 10.7*  --   --   HCT 33.0*  --   --   PLT 373  --   --   HEPARINUNFRC  --   --  0.15*  CREATININE 1.26*  --   --   TROPONINIHS 9 11  --      Estimated Creatinine Clearance: 35.4 mL/min (A) (by C-G formula based on SCr of 1.26 mg/dL (H)).   Medical History: Past Medical History:  Diagnosis Date   Arthritis    Heart murmur    Hypertension     Medications:  See home meds  Assessment: Patient present to ED with left sides chest discomfort and abdominal pain. CTA shows segmental pulmonary emboli right upper lobe greater than left lower lobe. Small amount of clot burden. No history of po anticoagulants.   HL 0.15 sub-therapeutic on 1050 units/hr No bleeding per RN  Goal of Therapy:  Heparin level 0.3-0.7 units/ml Monitor platelets by anticoagulation protocol: Yes   Plan:  Heparin bolus 1500 units x 1 Increase heparin drip to 1300 units/hr Heparin level in 8 hours Daily CBC  Arley Phenix RPh 11/16/2022, 9:44 PM

## 2022-11-16 NOTE — ED Notes (Signed)
Delay in transport ordered verbally by IP MD. Order to keep pt in the ED for now until discussion with family.

## 2022-11-16 NOTE — ED Notes (Signed)
This nt brought a bedside commode in the room for this pt to use. While entering the room registration was asking for another signature, the daughter of this pt then became angry and said the doctor or the nurse needs to come in the room before she takes her mothers iv out and un hooks her and takes her home. This nt was brining the bedside commode in the room when the daughter proceeded to ask me my job title, when answered the daughter told me to leave the room that she is a cna and is capable of doing it herself. Press photographer and assigned nurse notified

## 2022-11-17 ENCOUNTER — Encounter (HOSPITAL_COMMUNITY): Payer: Self-pay | Admitting: Family Medicine

## 2022-11-17 ENCOUNTER — Inpatient Hospital Stay (HOSPITAL_COMMUNITY): Payer: Medicare HMO

## 2022-11-17 DIAGNOSIS — E611 Iron deficiency: Secondary | ICD-10-CM

## 2022-11-17 DIAGNOSIS — I2699 Other pulmonary embolism without acute cor pulmonale: Secondary | ICD-10-CM | POA: Diagnosis not present

## 2022-11-17 DIAGNOSIS — R0602 Shortness of breath: Secondary | ICD-10-CM | POA: Diagnosis not present

## 2022-11-17 DIAGNOSIS — J432 Centrilobular emphysema: Secondary | ICD-10-CM | POA: Diagnosis not present

## 2022-11-17 DIAGNOSIS — R918 Other nonspecific abnormal finding of lung field: Secondary | ICD-10-CM | POA: Diagnosis not present

## 2022-11-17 DIAGNOSIS — M7989 Other specified soft tissue disorders: Secondary | ICD-10-CM

## 2022-11-17 DIAGNOSIS — R109 Unspecified abdominal pain: Secondary | ICD-10-CM | POA: Diagnosis not present

## 2022-11-17 DIAGNOSIS — I429 Cardiomyopathy, unspecified: Secondary | ICD-10-CM

## 2022-11-17 DIAGNOSIS — I5020 Unspecified systolic (congestive) heart failure: Secondary | ICD-10-CM | POA: Diagnosis not present

## 2022-11-17 DIAGNOSIS — E278 Other specified disorders of adrenal gland: Secondary | ICD-10-CM | POA: Diagnosis not present

## 2022-11-17 DIAGNOSIS — F17210 Nicotine dependence, cigarettes, uncomplicated: Secondary | ICD-10-CM

## 2022-11-17 LAB — CBC
HCT: 30.4 % — ABNORMAL LOW (ref 36.0–46.0)
Hemoglobin: 10 g/dL — ABNORMAL LOW (ref 12.0–15.0)
MCH: 29.1 pg (ref 26.0–34.0)
MCHC: 32.9 g/dL (ref 30.0–36.0)
MCV: 88.4 fL (ref 80.0–100.0)
Platelets: 353 10*3/uL (ref 150–400)
RBC: 3.44 MIL/uL — ABNORMAL LOW (ref 3.87–5.11)
RDW: 15.1 % (ref 11.5–15.5)
WBC: 11.4 10*3/uL — ABNORMAL HIGH (ref 4.0–10.5)
nRBC: 0 % (ref 0.0–0.2)

## 2022-11-17 LAB — HEPARIN LEVEL (UNFRACTIONATED)
Heparin Unfractionated: 0.31 IU/mL (ref 0.30–0.70)
Heparin Unfractionated: 0.38 IU/mL (ref 0.30–0.70)

## 2022-11-17 MED ORDER — FLUTICASONE FUROATE-VILANTEROL 100-25 MCG/ACT IN AEPB
1.0000 | INHALATION_SPRAY | Freq: Every day | RESPIRATORY_TRACT | Status: DC
  Administered 2022-11-18 – 2022-11-26 (×9): 1 via RESPIRATORY_TRACT
  Filled 2022-11-17: qty 28

## 2022-11-17 MED ORDER — METOPROLOL SUCCINATE ER 50 MG PO TB24
50.0000 mg | ORAL_TABLET | Freq: Every day | ORAL | Status: DC
  Administered 2022-11-17 – 2022-11-26 (×10): 50 mg via ORAL
  Filled 2022-11-17 (×10): qty 1

## 2022-11-17 MED ORDER — IOHEXOL 300 MG/ML  SOLN
100.0000 mL | Freq: Once | INTRAMUSCULAR | Status: AC | PRN
  Administered 2022-11-17: 100 mL via INTRAVENOUS

## 2022-11-17 MED ORDER — GUAIFENESIN 100 MG/5ML PO LIQD
5.0000 mL | ORAL | Status: DC | PRN
  Administered 2022-11-17 – 2022-11-25 (×14): 5 mL via ORAL
  Filled 2022-11-17 (×13): qty 10

## 2022-11-17 MED ORDER — UMECLIDINIUM BROMIDE 62.5 MCG/ACT IN AEPB
1.0000 | INHALATION_SPRAY | Freq: Every day | RESPIRATORY_TRACT | Status: DC
  Administered 2022-11-18 – 2022-11-26 (×9): 1 via RESPIRATORY_TRACT
  Filled 2022-11-17 (×2): qty 7

## 2022-11-17 NOTE — Progress Notes (Signed)
Mobility Specialist - Progress Note   11/17/22 1325  Mobility  Activity Ambulated with assistance in hallway  Level of Assistance Modified independent, requires aide device or extra time  Assistive Device Front wheel walker  Distance Ambulated (ft) 350 ft  Range of Motion/Exercises Active  Activity Response Tolerated well  Mobility Referral Yes  $Mobility charge 1 Mobility  Mobility Specialist Start Time (ACUTE ONLY) 1315  Mobility Specialist Stop Time (ACUTE ONLY) 1325  Mobility Specialist Time Calculation (min) (ACUTE ONLY) 10 min   Pt was found in bed and agreeable to ambulate. Had no complaints during session and at EOS returned to bed with needs met. Daughter in room.  Billey Chang Mobility Specialist

## 2022-11-17 NOTE — Progress Notes (Addendum)
ANTICOAGULATION CONSULT NOTE - follow up  Pharmacy Consult for Heparin Indication: pulmonary embolus  No Known Allergies  Patient Measurements: Height: 5\' 4"  (162.6 cm) Weight: 63.5 kg (140 lb) IBW/kg (Calculated) : 54.7 HEPARIN DW (KG): 63.5   Vital Signs: Temp: 99.4 F (37.4 C) (06/13 0454) Temp Source: Oral (06/13 0454) BP: 137/69 (06/13 0454) Pulse Rate: 87 (06/13 0454)  Labs: Recent Labs    11/16/22 0849 11/16/22 1238 11/16/22 2048 11/17/22 0621  HGB 10.7*  --   --  10.0*  HCT 33.0*  --   --  30.4*  PLT 373  --   --  353  HEPARINUNFRC  --   --  0.15* 0.31  CREATININE 1.26*  --   --   --   TROPONINIHS 9 11  --   --      Estimated Creatinine Clearance: 35.4 mL/min (A) (by C-G formula based on SCr of 1.26 mg/dL (H)).   Medical History: Past Medical History:  Diagnosis Date   Arthritis    Heart murmur    Hypertension    Assessment: Patient present to ED with left sides chest discomfort and abdominal pain. CTA shows segmental pulmonary emboli right upper lobe greater than left lower lobe. Small amount of clot burden. No history of oral anticoagulants.   11/17/2022 Heparin level 0.31 - therapeutic after 1500 unit bolus & rate increase to 1300 units/hr CBC stable No bleeding reported  Goal of Therapy:  Heparin level 0.3-0.7 units/ml Monitor platelets by anticoagulation protocol: Yes   Plan:  continue heparin drip @ 1300 units/hr Confirmatory heparin level  in 8 hours Daily CBC& heparin level No change to DOAC until work up of lung & adrenal mass completed  Herby Abraham, Pharm.D Use secure chat for questions 11/17/2022 8:18 AM

## 2022-11-17 NOTE — Progress Notes (Signed)
PROGRESS NOTE    NGA RABON  XBJ:[id number] DOB: [birth year] DOA: 11/16/2022 PCP: Renaye Rakers, MD   Brief Narrative:  72 year old-year-old female with history of hypertension and tobacco abuse presented with abdominal pain along with cough and productive sputum.  On presentation, D-dimer was 3.14; CTA chest showed segmental pulmonary emboli right upper lobe greater than left upper lobe along with spiculated left upper lobe mass measuring 2.5 cm and right adrenal mass.  Echo showed EF of 30 to 35% with global hypokinesis.  She was started on heparin drip and transferred to [facility] as per patient/family request.  Assessment & Plan:   Acute pulmonary embolism -Imaging as above.  Currently on heparin drip. -Lower extremity duplex ultrasound was negative for DVT. -Will transition to DOAC only after possible adrenal or lung biopsy -Currently on room air. 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  Consult PCCM and IR.  Follow recommendations.  Keep n.p.o. for now.  Will need oncology evaluation and follow-up as well at some point.  Possible acute systolic heart failure -Echo showed EF of 30 to 35%.  New diagnosis.  Consult cardiology.  Strict input and output.  Daily weights.  Fluid restriction.  AKI versus CKD -Creatinine 1.26 on presentation.  No recent renal function available.  Repeat a.m. labs.  Possible COPD Tobacco abuse -Follow pulmonary recommendations.  Continue current inhaled regimen.  Will need outpatient PFTs -Patient was counseled regarding tobacco cessation by admitting hospitalist -Continue nicotine patch  Hypertension -Monitor blood pressure.  Continue amlodipine.  Losartan on hold.    Normocytic anemia -Possibly anemia of chronic disease.  Hemoglobin stable.  No signs of bleeding.  Leukocytosis -Possibly reactive.  DVT prophylaxis: Heparin drip Code  Status: Full Family Communication: Daughter at bedside Disposition Plan: Status is: Inpatient Remains inpatient appropriate because: Of severity of illness  Consultants: Pulmonary/IR  Procedures: Echo  Antimicrobials: None   Subjective: Patient seen and examined at bedside.  Complains of intermittent cough and shortness of breath.  Denies any current chest pain, abdominal pain or vomiting.  Objective: Vitals:   11/16/22 1815 11/16/22 2130 11/17/22 0058 11/17/22 0454  BP: (!) 153/67 123/65 129/73 137/69  Pulse: 96 86 91 87  Resp: 20 20    Temp: (!) 100.5 F (38.1 C) 99.6 F (37.6 C) (!) 100.7 F (38.2 C) 99.4 F (37.4 C)  TempSrc:   Oral Oral  SpO2: 94% (!) 68% 91% 94%  Weight:      Height:        Intake/Output Summary (Last 24 hours) at 11/17/2022 1140 Last data filed at 11/17/2022 0600 Gross per 24 hour  Intake 293.16 ml  Output 800 ml  Net -506.84 ml   Filed Weights   11/16/22 0828  Weight: 63.5 kg    Examination:  General exam: Appears calm and comfortable.  No distress.  On room air.  Looks chronically ill and deconditioned Respiratory system: Bilateral decreased breath sounds at bases with scattered crackles Cardiovascular system: S1 & S2 heard, Rate controlled Gastrointestinal system: Abdomen is nondistended, soft and nontender. Normal bowel sounds heard. Extremities: No cyanosis, clubbing, edema  Central nervous system: Alert and oriented.  Slow to respond.  No focal neurological deficits. Moving extremities Skin: No rashes, lesions or ulcers Psychiatry: Flat affect.  Not agitated.   Data Reviewed: I have personally reviewed following labs and imaging studies  CBC: Recent Labs  Lab 11/16/22  1610 11/17/22 0621  WBC 11.2* 11.4*  HGB 10.7* 10.0*  HCT 33.0* 30.4*  MCV 89.7 88.4  PLT 373 353   Basic Metabolic Panel: Recent Labs  Lab 11/16/22 0849  NA 137  K 3.9  CL 100  CO2 28  GLUCOSE 113*  BUN 23  CREATININE 1.26*  CALCIUM 9.2    GFR: Estimated Creatinine Clearance: 35.4 mL/min (A) (by C-G formula based on SCr of 1.26 mg/dL (H)). Liver Function Tests: No results for input(s): "AST", "ALT", "ALKPHOS", "BILITOT", "PROT", "ALBUMIN" in the last 168 hours. No results for input(s): "LIPASE", "AMYLASE" in the last 168 hours. No results for input(s): "AMMONIA" in the last 168 hours. Coagulation Profile: No results for input(s): "INR", "PROTIME" in the last 168 hours. Cardiac Enzymes: No results for input(s): "CKTOTAL", "CKMB", "CKMBINDEX", "TROPONINI" in the last 168 hours. BNP (last 3 results) No results for input(s): "PROBNP" in the last 8760 hours. HbA1C: No results for input(s): "HGBA1C" in the last 72 hours. CBG: No results for input(s): "GLUCAP" in the last 168 hours. Lipid Profile: No results for input(s): "CHOL", "HDL", "LDLCALC", "TRIG", "CHOLHDL", "LDLDIRECT" in the last 72 hours. Thyroid Function Tests: No results for input(s): "TSH", "T4TOTAL", "FREET4", "T3FREE", "THYROIDAB" in the last 72 hours. Anemia Panel: Recent Labs    11/16/22 1238  VITAMINB12 288  FOLATE 8.8  FERRITIN 108  TIBC 244*  IRON 15*   Sepsis Labs: No results for input(s): "PROCALCITON", "LATICACIDVEN" in the last 168 hours.  No results found for this or any previous visit (from the past 240 hour(s)).       Radiology Studies: VAS [place] LOWER EXTREMITY VENOUS (DVT)  Result Date: 11/17/2022  Lower Venous DVT Study Patient Name:  CHLO… BLEAK  Date of Exam:   11/17/2022 Medical Rec #: [id number]         Accession #:    [id number] Date of Birth: May 12, 1951         Patient Gender: F Patient Age:   46 years Exam Location:  [facility] Procedure:      VAS [place] LOWER EXTREMITY VENOUS (DVT) Referring Phys: COURAGE EMOKPAE --------------------------------------------------------------------------------  Indications: Swelling.  Risk Factors: Confirmed PE. Anticoagulation: Heparin. Limitations: Poor ultrasound/tissue interface.  Comparison Study: No prior studies. Performing Technologist: Chanda Busing RVT  Examination Guidelines: A complete evaluation includes B-mode imaging, spectral Doppler, color Doppler, and power Doppler as needed of all accessible portions of each vessel. Bilateral testing is considered an integral part of a complete examination. Limited examinations for reoccurring indications may be performed as noted. The reflux portion of the exam is performed with the patient in reverse Trendelenburg.  +---------+---------------+---------+-----------+----------+--------------+ RIGHT    CompressibilityPhasicitySpontaneityPropertiesThrombus Aging +---------+---------------+---------+-----------+----------+--------------+ CFV      Full           Yes      Yes                                 +---------+---------------+---------+-----------+----------+--------------+ SFJ      Full                                                        +---------+---------------+---------+-----------+----------+--------------+ FV Prox  Full                                                        +---------+---------------+---------+-----------+----------+--------------+  FV Mid   Full                                                        +---------+---------------+---------+-----------+----------+--------------+ FV DistalFull                                                        +---------+---------------+---------+-----------+----------+--------------+ PFV      Full                                                        +---------+---------------+---------+-----------+----------+--------------+ POP      Full           Yes      Yes                                 +---------+---------------+---------+-----------+----------+--------------+ PTV      Full                                                        +---------+---------------+---------+-----------+----------+--------------+ PERO      Full                                                        +---------+---------------+---------+-----------+----------+--------------+   +---------+---------------+---------+-----------+----------+--------------+ LEFT     CompressibilityPhasicitySpontaneityPropertiesThrombus Aging +---------+---------------+---------+-----------+----------+--------------+ CFV      Full           Yes      Yes                                 +---------+---------------+---------+-----------+----------+--------------+ SFJ      Full                                                        +---------+---------------+---------+-----------+----------+--------------+ FV Prox  Full                                                        +---------+---------------+---------+-----------+----------+--------------+ FV Mid   Full                                                        +---------+---------------+---------+-----------+----------+--------------+  FV DistalFull                                                        +---------+---------------+---------+-----------+----------+--------------+ PFV      Full                                                        +---------+---------------+---------+-----------+----------+--------------+ POP      Full           Yes      Yes                                 +---------+---------------+---------+-----------+----------+--------------+ PTV      Full                                                        +---------+---------------+---------+-----------+----------+--------------+ PERO     Full                                                        +---------+---------------+---------+-----------+----------+--------------+     Summary: RIGHT: - There is no evidence of deep vein thrombosis in the lower extremity.  - No cystic structure found in the popliteal fossa.  LEFT: - There is no evidence of deep vein thrombosis in the  lower extremity.  - No cystic structure found in the popliteal fossa.  *See table(s) above for measurements and observations. Electronically signed by Lemar Livings MD on 11/17/2022 at 11:04:10 AM.    Final    ECHOCARDIOGRAM COMPLETE  Result Date: 11/16/2022    ECHOCARDIOGRAM REPORT   Patient Name:   NASREEN GARRAND Date of Exam: 11/16/2022 Medical Rec #:  [id number]        Height:       64.0 in Accession #:    [id number]       Weight:       140.0 lb Date of Birth:  [birth year]        BSA:          1.681 m Patient Age:    71 years         BP:           158/115 mmHg Patient Gender: F                HR:           78 bpm. [facility]am Location[facility]  [facility] Procedure: 2D Echo, Cardiac Doppler and Color Doppler Indications:    Pulmonary Embolus I26.09  History:        Patient has prior history of Echocardiogram examinations, most                 recent 11/16/2001. Signs/Symptoms:Murmur; Risk  Factors:Hypertension.  Sonographer:    Aron Baba Referring Phys: [id number] COURAGE EMOKPAE IMPRESSIONS  1. Left ventricular ejection fraction, by estimation, is 30 to 35%. The left ventricle has moderately decreased function. The left ventricle demonstrates global hypokinesis. Left ventricular diastolic parameters are indeterminate.  2. Right ventricular systolic function is normal. The right ventricular size is normal.  3. Left atrial size was mildly dilated.  4. A small pericardial effusion is present. The pericardial effusion is circumferential.  5. The mitral valve is normal in structure. No evidence of mitral valve regurgitation. No evidence of mitral stenosis.  6. The aortic valve was not well visualized. Aortic valve regurgitation is mild to moderate. FINDINGS  Left Ventricle: Left ventricular ejection fraction, by estimation, is 30 to 35%. The left ventricle has moderately decreased function. The left ventricle demonstrates global hypokinesis. Definity contrast agent was given IV to delineate the left ventricular  endocardial borders. The left ventricular internal cavity size was normal in size. There is no left ventricular hypertrophy. Left ventricular diastolic parameters are indeterminate. Right Ventricle: The right ventricular size is normal. Right vetricular wall thickness was not well visualized. Right ventricular systolic function is normal. Left Atrium: Left atrial size was mildly dilated. Right Atrium: Right atrial size was normal in size. Pericardium: A small pericardial effusion is present. The pericardial effusion is circumferential. Mitral Valve: The mitral valve is normal in structure. No evidence of mitral valve regurgitation. No evidence of mitral valve stenosis. Tricuspid Valve: The tricuspid valve is normal in structure. Tricuspid valve regurgitation is mild . No evidence of tricuspid stenosis. Aortic Valve: The aortic valve was not well visualized. Aortic valve regurgitation is mild to moderate. Aortic regurgitation PHT measures 442 msec. Aortic valve mean gradient measures 8.0 mmHg. Aortic valve peak gradient measures 16.6 mmHg. Aortic valve area, by VTI measures 1.49 cm. Pulmonic Valve: The pulmonic valve was not well visualized. Pulmonic valve regurgitation is not visualized. No evidence of pulmonic stenosis. Aorta: The aortic root is normal in size and structure. IAS/Shunts: The interatrial septum was not well visualized.  LEFT VENTRICLE PLAX 2D LVIDd:         4.45 cm   Diastology LVIDs:         4.00 cm   LV e' medial:    4.99 cm/s LV PW:         1.20 cm   LV E/e' medial:  38.5 LV IVS:        0.90 cm   LV e' lateral:   5.43 cm/s LVOT diam:     2.00 cm   LV E/e' lateral: 35.4 LV SV:         53 LV SV Index:   32 LVOT Area:     3.14 cm  RIGHT VENTRICLE RV S prime:     13.10 cm/s TAPSE (M-mode): 1.6 cm LEFT ATRIUM           Index        RIGHT ATRIUM           Index LA diam:      2.70 cm 1.61 cm/m   RA Area:     14.00 cm LA Vol (A2C): 36.5 ml 21.71 ml/m  RA Volume:   34.70 ml  20.64 ml/m LA Vol (A4C):  58.3 ml 34.68 ml/m  AORTIC VALVE                     PULMONIC VALVE AV Area (Vmax):    1.57 cm  PR End Diast Vel: 5.20 msec AV Area (Vmean):   1.47 cm AV Area (VTI):     1.49 cm AV Vmax:           204.00 cm/s AV Vmean:          128.000 cm/s AV VTI:            0.357 m AV Peak Grad:      16.6 mmHg AV Mean Grad:      8.0 mmHg LVOT Vmax:         102.19 cm/s LVOT Vmean:        59.811 cm/s LVOT VTI:          0.170 m LVOT/AV VTI ratio: 0.48 AI PHT:            442 msec  AORTA Ao Root diam: 3.00 cm Ao Asc diam:  3.10 cm MITRAL VALVE                TRICUSPID VALVE MV Area (PHT): 9.25 cm     TR Peak grad:   23.8 mmHg MV Decel Time: 82 msec      TR Vmax:        244.00 cm/s MR Peak grad: 7.7 mmHg MR Vmax:      139.00 cm/s   SHUNTS MV E velocity: 192.00 cm/s  Systemic VTI:  0.17 m MV A velocity: 48.40 cm/s   Systemic Diam: 2.00 cm MV E/A ratio:  3.97 Dina Rich MD Electronically signed by Dina Rich MD Signature Date/Time: 11/16/2022/3:04:20 PM    Final    CT Angio Chest Pulmonary Embolism (PE) W or WO Contrast  Result Date: 11/16/2022 CLINICAL DATA:  Chest pain EXAM: CT ANGIOGRAPHY CHEST WITH CONTRAST TECHNIQUE: Multidetector CT imaging of the chest was performed using the standard protocol during bolus administration of intravenous contrast. Multiplanar CT image reconstructions and MIPs were obtained to evaluate the vascular anatomy. RADIATION DOSE REDUCTION: This exam was performed according to the departmental dose-optimization program which includes automated exposure control, adjustment of the mA and/or kV according to patient size and/or use of iterative reconstruction technique. CONTRAST:  75mL OMNIPAQUE IOHEXOL 350 MG/ML SOLN COMPARISON:  X-ray 11/16/2022 FINDINGS: Cardiovascular: Heart is mildly enlarged. Small pericardial effusion. Coronary artery calcifications are seen. The thoracic aorta has a normal course and caliber with scattered atherosclerotic plaque. Multiple areas of pulmonary emboli  identified most striking in segmental branches in the right upper lobe such as axial series 4, image 43. Additional subtle area in the anterior left lower lobe on image 54. No large or central embolus. No signs of right heart strain at this time. Mediastinum/Nodes: Patulous esophagus. Small thyroid gland. No specific abnormal lymph node enlargement identified in the axillary regions, hilum or mediastinum. Lungs/Pleura: Centrilobular emphysematous changes are seen greatest in the upper lung zones. There is spiculated left upper lobe mass near the margin of the hilum and mediastinum measuring on series 6, image 49 at 2.5 by 2.1 cm. There is some linear opacity lung bases likely scar or atelectasis. Subtle opacity as well in the left lower lobe. Trace left pleural fluid. No pneumothorax. Breathing motion. Additional nodule in the lingula on series 6 image 86 measuring 7 mm. Upper Abdomen: Right adrenal mass identified measuring 4.5 by 2.9 cm. Nodular thickening of the left adrenal gland as well. Musculoskeletal: Degenerative changes seen along the spine. Critical Value/emergent results were called by telephone at the time of interpretation on 11/16/2022 at 7:48 am to provider [facility] ,  who verbally acknowledged these results. Review of the MIP images confirms the above findings. IMPRESSION: Segmental pulmonary emboli right upper lobe greater than left lower lobe. Small amount of clot burden. Mildly enlarged heart with a small pericardial effusion. 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 Separate 7 mm lingular lesion. Right adrenal mass identified at the edge of the imaging field. Malignant or metastatic lesion is possible. Recommend overall further evaluation. PET-CT scan may be useful as the next step in the workup versus sampling. Tiny left pleural effusion Aortic Atherosclerosis (ICD10-I70.0) and Emphysema (ICD10-J43.9). Electronically Signed   By: Karen Kays M.D.   On: 11/16/2022 10:52   DG Chest 2 View  Result Date: 11/16/2022 CLINICAL DATA:  chest pain EXAM: CHEST - 2 VIEW COMPARISON:  None Available. FINDINGS: No pleural effusion. No pneumothorax. No focal airspace opacity. Normal cardiac and mediastinal contours. No radiographically apparent displaced rib fractures. Visualized upper abdomen is unremarkable. Vertebral body heights are maintained. IMPRESSION: No focal airspace opacity. Electronically Signed   By: Lorenza Cambridge M.D.   On: 11/16/2022 09:21        Scheduled Meds:  amLODipine  2.5 mg Oral Daily   Chlorhexidine Gluconate Cloth  6 each Topical Q0600   fluticasone furoate-vilanterol  1 puff Inhalation Daily   lidocaine  1 patch Transdermal Q24H   nicotine  21 mg Transdermal Daily   sodium chloride flush  3 mL Intravenous Q12H   sodium chloride flush  3 mL Intravenous Q12H   umeclidinium bromide  1 puff Inhalation Daily   Continuous Infusions:  sodium chloride     heparin 1,300 Units/hr (11/17/22 0527)          Glade Lloyd, MD [facility] 11/17/2022, 11:40 AM

## 2022-11-17 NOTE — TOC Initial Note (Addendum)
Transition of Care [facility]) - Initial/Assessment Note    Patient Details  Name: Erin Pacheco MRN: [id number] Date of Birth: [birth year]  Transition of Care [facility]) CM/SW Contact:    Howell Rucks, RN Phone Number: 11/17/2022, 10:01 AM  Clinical Narrative:   Met with pt/ pt's dtr at bedside to introduce role of TOC/NCM and review for dc needs. Pt on phone during interview. Dtr reports pt has PCP and pharmacy in place, no home services at this time,home DME: w/c, shower chair, walker, ramp, dtr reports pt may need potty chair.  Dtr to provide transportation at discharge. TOC will continue to follow.      -10:18am  DME order for 3 in 1 entered by NCM per MD request, MD sent request to cosign. [employer] rep-Jermaine for 3 in 1            Expected Discharge Plan: Home/Self Care Barriers to Discharge: Continued Medical Work up   Patient Goals and CMS Choice Patient states their goals for this hospitalization and ongoing recovery are:: Home with dtr          Expected Discharge Plan and Services       Living arrangements for the past 2 months: Single Family Home                                      Prior Living Arrangements/Services Living arrangements for the past 2 months: Single Family Home Lives with:: Adult Children (per dtr, pt to dc to dtr's home)   Do you feel safe going back to the place where you live?: Yes      Need for Family Participation in Patient Care: Yes (Comment) Care giver support system in place?: Yes (comment)   Criminal Activity/Legal Involvement Pertinent to Current Situation/Hospitalization: No - Comment as needed  Activities of Daily Living Home Assistive Devices/Equipment: None ADL Screening (condition at time of admission) Patient's cognitive ability adequate to safely complete daily activities?: Yes Is the patient deaf or have difficulty hearing?: No Does the patient have difficulty seeing, even when wearing glasses/contacts?: No Does the  patient have difficulty concentrating, remembering, or making decisions?: No Patient able to express need for assistance with ADLs?: Yes Does the patient have difficulty dressing or bathing?: No Independently performs ADLs?: Yes (appropriate for developmental age) Does the patient have difficulty walking or climbing stairs?: No Weakness of Legs: None Weakness of Arms/Hands: None  Permission Sought/Granted Permission sought to share information with : Case Manager Permission granted to share information with : Yes, Verbal Permission Granted  Share Information with NAME: Fannie Knee, RN           Emotional Assessment Appearance:: Appears stated age Attitude/Demeanor/Rapport: Gracious Affect (typically observed): Accepting Orientation: : Oriented to Self, Oriented to Place, Oriented to  Time, Oriented to Situation Alcohol / Substance Use: Not Applicable Psych Involvement: No (comment)  Admission diagnosis:  Acute pulmonary embolism (HCC) [I26.99] Patient Active Problem List   Diagnosis Date Noted   Acute pulmonary embolism (HCC) 11/16/2022   HTN (hypertension) 11/16/2022   Smoker 11/16/2022   Mass of left lung 11/16/2022   Adrenal mass, right (HCC) 11/16/2022   HFrEF (heart failure with reduced ejection fraction) /EF 30 to 35 % 11/16/2022   PCP:  Renaye Rakers, MD Pharmacy:   [facility] [phone] - [place] - [address] AT [facility] [address] [place] [address] Phone:  [phone] Fax: [phone]     Social Determinants of Health (SDOH) Social History: SDOH Screenings   Food Insecurity: No Food Insecurity (11/17/2022)  Housing: Low Risk  (11/17/2022)  Transportation Needs: No Transportation Needs (11/17/2022)  Utilities: Not At Risk (11/17/2022)  Tobacco Use: High Risk (11/16/2022)   SDOH Interventions:     Readmission Risk Interventions    11/17/2022    9:59 AM  Readmission Risk Prevention Plan  Post Dischage Appt Complete  Medication  Screening Complete  Transportation Screening Complete

## 2022-11-17 NOTE — Consult Note (Signed)
NAME:  Erin Pacheco, MRN:  [id number], DOB:  [birth year], LOS: 1 ADMISSION DATE:  11/16/2022, CONSULTATION DATE:  11/17/2022 REFERRING MD:  Dr. Pauletta Browns, [facility], CHIEF COMPLAINT:  cough   History of Present Illness:  72 yo female smoker presented to [facility] with productive cough.  She had elevated D dimer.  CT angio chest showed multiple areas of acute pulmonary emboli, centrilobular emphysema more in upper lungs, a spiculated mass 2.5 x 2.1 cm in LUL, and 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  She was started on heparin gtt and transferred to [facility] for further management.  Pertinent  Medical History  HTN, Arthritis  Significant Hospital Events: Including procedures, antibiotic start and stop dates in addition to other pertinent events   6/12 present to [facility], start heparin gtt, transfer to [facility]  Interim History / Subjective:  She has been getting sinus congestion and coughing spells for the past several months.  She was treated for a sinus infection a couple months ago.  She was given an albuterol inhaler and this helped.  She started smoking as a teenager.  She smoked less than 1 ppd.  Several relatives of hers also smoked.  She had asthma as a child.  No hx of pneumonia or TB.  She works as a Education administrator.  Objective   Blood pressure 137/69, pulse 87, temperature 99.4 F (37.4 C), temperature source Oral, resp. rate 20, height 5\' 4"  (1.626 m), weight 63.5 kg, SpO2 94 %.        Intake/Output Summary (Last 24 hours) at 11/17/2022 1012 Last data filed at 11/17/2022 0600 Gross per 24 hour  Intake 293.16 ml  Output 800 ml  Net -506.84 ml   Filed Weights   11/16/22 0828  Weight: 63.5 kg    Examination:  General - alert Eyes - pupils reactive ENT - no sinus tenderness, no stridor Cardiac - regular rate/rhythm, no murmur Chest - equal breath sounds b/l, no wheezing or rales Abdomen - soft, non tender, + bowel sounds Extremities - no cyanosis, clubbing, or edema Skin - no  rashes Neuro - normal strength, moves extremities, follows commands Psych - normal mood and behavior Lymphatics - no lymphadenopathy  Assessment & Plan:   Lt upper lobe 2.5 x 2.1 cm spiculated mass with 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 - continue heparin gtt for now - f/u doppler of legs  Acute systolic CHF. - Echo from 11/16/22 shows EF 30 to 35%, global hypokinesis, mild LA dilation, small pericardial effusion, mild/mod AR  Centrilobular emphysema. - will try on incruse, breo - prn albuterol - will need PFT set up as an outpt  Tobacco abuse. - discussed importance of smoking cessation  Updated her daughter at bedside.  D/w Dr. Pauletta Browns.  Labs   CBC: Recent Labs  Lab 11/16/22 0849 11/17/22 0621  WBC 11.2*  11.4*  HGB 10.7* 10.0*  HCT 33.0* 30.4*  MCV 89.7 88.4  PLT 373 353    Basic Metabolic Panel: Recent Labs  Lab 11/16/22 0849  NA 137  K 3.9  CL 100  CO2 28  GLUCOSE 113*  BUN 23  CREATININE 1.26*  CALCIUM 9.2   GFR: Estimated Creatinine Clearance: 35.4 mL/min (A) (by C-G formula based on SCr of 1.26 mg/dL (H)). Recent Labs  Lab 11/16/22 0849 11/17/22 0621  WBC 11.2* 11.4*    Liver Function Tests: No results for input(s): "AST", "ALT", "ALKPHOS", "BILITOT", "PROT", "ALBUMIN" in the last 168 hours. No results for input(s): "LIPASE", "AMYLASE" in the last 168 hours. No results for input(s):  "AMMONIA" in the last 168 hours.  ABG No results found for: "PHART", "PCO2ART", "PO2ART", "HCO3", "TCO2", "ACIDBASEDEF", "O2SAT"   Coagulation Profile: No results for input(s): "INR", "PROTIME" in the last 168 hours.  Cardiac Enzymes: No results for input(s): "CKTOTAL", "CKMB", "CKMBINDEX", "TROPONINI" in the last 168 hours.  HbA1C: No results found for: "HGBA1C"  CBG: No results for input(s): "GLUCAP" in the last 168 hours.  Review of Systems:   Reviewed and negative  Past Medical History:  She,  has a past medical history of Arthritis, Heart murmur, and Hypertension.   Surgical History:   Past Surgical History:  Procedure Laterality Date   ABDOMINAL HYSTERECTOMY     BILATERAL OOPHORECTOMY     CATARACT EXTRACTION W/PHACO  12/27/2010   Procedure: CATARACT EXTRACTION PHACO AND INTRAOCULAR LENS PLACEMENT (IOC);  Surgeon: Gemma Payor;  Location: [facility];  Service: Ophthalmology;  Laterality: Right;   CATARACT EXTRACTION W/PHACO  03/28/2011   Procedure: CATARACT EXTRACTION PHACO AND INTRAOCULAR LENS PLACEMENT (IOC);  Surgeon: Gemma Payor;  Location: [facility];  Service: Ophthalmology;  Laterality: Left;  CDE 7.27     Social History:   reports that she has been smoking cigarettes. She has a 15.00 pack-year smoking history. She has never used smokeless tobacco. She reports that she does not drink alcohol and does not use drugs.   Family History:  Her family history is negative for Anesthesia problems, Hypotension, Malignant hyperthermia, and Pseudochol deficiency.   Allergies No Known Allergies   Home Medications  Prior to Admission medications   Medication Sig Start Date End Date Taking? Authorizing Provider  diclofenac (VOLTAREN) 75 MG EC tablet Take 75 mg by mouth 2 (two) times daily as needed for mild pain. 01/27/21  Yes [provider]  ibuprofen (ADVIL,MOTRIN) 200 MG tablet Take 400-600 mg by mouth every 6 (six) hours as needed. Pain    Yes [provider]   Multiple Vitamin (MULTIVITAMIN) tablet Take 1 tablet by mouth daily.     Yes [provider]  Polyethyl Glycol-Propyl Glycol (SYSTANE OP) Apply 1 drop to eye daily as needed (dry eye). Dry Eyes   Yes [provider]  valsartan-hydrochlorothiazide (DIOVAN-HCT) 160-12.5 MG tablet Take 1 tablet by mouth daily. 12/05/20  Yes [provider]  aliskiren (TEKTURNA) 150 MG tablet Take 150 mg by mouth daily.   Patient not taking: Reported on 11/16/2022    [provider]  dexamethasone (DECADRON) 4 MG tablet Take 4 mg by mouth daily as needed. For arthritis pain Patient not taking: Reported on 11/16/2022    [provider]  LORazepam (ATIVAN) 1 MG tablet Take 1 tablet (1 mg total) by mouth 3 (three) times daily as needed for anxiety. Patient not taking: Reported on 11/16/2022 05/30/12   Donnetta Hutching, MD  Signature:  Coralyn Helling, MD [facility] Pulmonary/Critical Care Pager - [phone] or [phone] 11/17/2022, 11:10 AM

## 2022-11-17 NOTE — Consult Note (Addendum)
Referral MD  Reason for Referral: Left lung mass; pulmonary embolism; adrenal mass  Chief Complaint  Patient presents with   Abdominal Pain  : I got short of breath.  HPI: [name] is a very charming 72 year old Afro-American female.  She lives in [place].  She has had multiple jobs.  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  She apparently has some shortness of breath.  She had a cough.  She had no hemoptysis.  She subsequently went to the ER at [facility] yesterday.  She had a CT angiogram.  This did show a segmental pulmonary embolism in the right lower lobe and left upper lobe.  There is had a small clot burden there is no cardiac strain.  She did have a nodule in the left upper lobe.  This measured 2.5 x 2.1 cm.  This also showed a mass in the right adrenal gland measuring 4.5 x 2.1 cm.  She was transferred down to [facility].  She is on heparin.  She had a CT of the abdomen pelvis.  The results are not back yet.  She did have Doppler of her lower legs today.  This was negative for any thromboembolic disease.  She has been seen by Pulmonary Medicine.  They recommended a biopsy of the adrenal mass.  This would confirm a histologic diagnosis in the stage.  She has had no headache.  She is still smoking.  She probably has about a 30-pack-year history of tobacco use.  She said that she has not smoked in 2 days.  She had lab work done showed a white cell count 11.4.  Hemoglobin 10 and platelet count 353,000.  She clearly is iron deficient.  Her iron studies showed ferritin 108 with an iron saturation of 6%.  As far she knows, there is been no melena or bright red blood per rectum.  I am not sure when her last colonoscopy was.  She has not lost weight.  Her appetite is doing okay.  She has had no problems with pain.  There is been no chest wall pain.  She has had no headache.  There is no visual changes.  Overall, I would say that her performance status is probably ECOG  2.    Past Medical History:  Diagnosis Date   Arthritis    Hypertension    Tobacco abuse   :   Past Surgical History:  Procedure Laterality Date   ABDOMINAL HYSTERECTOMY     BILATERAL OOPHORECTOMY     CATARACT EXTRACTION W/PHACO  12/27/2010   Procedure: CATARACT EXTRACTION PHACO AND INTRAOCULAR LENS PLACEMENT (IOC);  Surgeon: Gemma Payor;  Location: [facility];  Service: Ophthalmology;  Laterality: Right;   CATARACT EXTRACTION W/PHACO  03/28/2011   Procedure: CATARACT EXTRACTION PHACO AND INTRAOCULAR LENS PLACEMENT (IOC);  Surgeon: Gemma Payor;  Location: [facility];  Service: Ophthalmology;  Laterality: Left;  CDE 7.27  :   Current Facility-Administered Medications:    0.9 %  sodium chloride infusion, , Intravenous, PRN, Mariea Clonts, Courage, MD   acetaminophen (TYLENOL) tablet 650 mg, 650 mg, Oral, Q6H PRN, 650 mg at 11/17/22 0535 **OR** acetaminophen (TYLENOL) suppository 650 mg, 650 mg, Rectal, Q6H PRN, Emokpae, Courage, MD   albuterol (PROVENTIL) (2.5 MG/3ML) 0.083% nebulizer solution 2.5 mg, 2.5 mg, Nebulization, Q2H PRN, Emokpae, Courage, MD   ALPRAZolam (XANAX) tablet 0.5 mg, 0.5 mg, Oral, TID PRN, Emokpae, Courage, MD   bisacodyl (DULCOLAX) suppository 10 mg, 10 mg, Rectal, Daily PRN, Emokpae, Courage,  MD   Chlorhexidine Gluconate Cloth 2 % PADS 6 each, 6 each, Topical, Q0600, Mariea Clonts, Courage, MD, 6 each at 11/17/22 0604   fentaNYL (SUBLIMAZE) injection 25 mcg, 25 mcg, Intravenous, Q2H PRN, Emokpae, Courage, MD   fluticasone furoate-vilanterol (BREO ELLIPTA) 100-25 MCG/ACT 1 puff, 1 puff, Inhalation, Daily, Sood, Vineet, MD   guaiFENesin (ROBITUSSIN) 100 MG/5ML liquid 5 mL, 5 mL, Oral, Q4H PRN, Emokpae, Courage, MD, 5 mL at 11/17/22 0603   heparin ADULT infusion 100 units/mL (25000 units/238mL), 1,300 Units/hr, Intravenous, Continuous, Maurice March, RPH, Last Rate: 13 mL/hr at 11/17/22 0527, 1,300 Units/hr at 11/17/22 0527   hydrALAZINE (APRESOLINE) injection 10 mg, 10 mg,  Intravenous, Q6H PRN, Emokpae, Courage, MD   lidocaine (LIDODERM) 5 % 1 patch, 1 patch, Transdermal, Q24H, Emokpae, Courage, MD, 1 patch at 11/17/22 1610   metoprolol succinate (TOPROL-XL) 24 hr tablet 50 mg, 50 mg, Oral, Daily, Rollene Rotunda, MD, 50 mg at 11/17/22 1731   nicotine (NICODERM CQ - dosed in mg/24 hours) patch 21 mg, 21 mg, Transdermal, Daily, Emokpae, Courage, MD, 21 mg at 11/17/22 0923   ondansetron (ZOFRAN) tablet 4 mg, 4 mg, Oral, Q6H PRN **OR** ondansetron (ZOFRAN) injection 4 mg, 4 mg, Intravenous, Q6H PRN, Emokpae, Courage, MD   oxyCODONE (Oxy IR/ROXICODONE) immediate release tablet 5 mg, 5 mg, Oral, Q4H PRN, Mariea Clonts, Courage, MD, 5 mg at 11/16/22 1533   polyethylene glycol (MIRALAX / GLYCOLAX) packet 17 g, 17 g, Oral, Daily PRN, Emokpae, Courage, MD   polyvinyl alcohol (LIQUIFILM TEARS) 1.4 % ophthalmic solution 1 drop, 1 drop, Both Eyes, PRN, Emokpae, Courage, MD   sodium chloride flush (NS) 0.9 % injection 3 mL, 3 mL, Intravenous, Q12H, Emokpae, Courage, MD, 3 mL at 11/17/22 1034   sodium chloride flush (NS) 0.9 % injection 3 mL, 3 mL, Intravenous, Q12H, Emokpae, Courage, MD, 3 mL at 11/17/22 1033   sodium chloride flush (NS) 0.9 % injection 3 mL, 3 mL, Intravenous, PRN, Emokpae, Courage, MD   traZODone (DESYREL) tablet 50 mg, 50 mg, Oral, QHS PRN, Emokpae, Courage, MD   umeclidinium bromide (INCRUSE ELLIPTA) 62.5 MCG/ACT 1 puff, 1 puff, Inhalation, Daily, Sood, Vineet, MD:   Chlorhexidine Gluconate Cloth  6 each Topical Q0600   fluticasone furoate-vilanterol  1 puff Inhalation Daily   lidocaine  1 patch Transdermal Q24H   metoprolol succinate  50 mg Oral Daily   nicotine  21 mg Transdermal Daily   sodium chloride flush  3 mL Intravenous Q12H   sodium chloride flush  3 mL Intravenous Q12H   umeclidinium bromide  1 puff Inhalation Daily  :  No Known Allergies:   Family History  Problem Relation Age of Onset   Ovarian cancer Mother    Anesthesia problems Neg Hx     Hypotension Neg Hx    Malignant hyperthermia Neg Hx    Pseudochol deficiency Neg Hx   :   Social History   Socioeconomic History   Marital status: Single    Spouse name: Not on file   Number of children: Not on file   Years of education: Not on file   Highest education level: Not on file  Occupational History   Not on file  Tobacco Use   Smoking status: Every Day    Packs/day: 0.50    Years: 30.00    Additional pack years: 0.00    Total pack years: 15.00    Types: Cigarettes   Smokeless tobacco: Never  Vaping Use   Vaping Use:  Never used  Substance and Sexual Activity   Alcohol use: No   Drug use: No   Sexual activity: Yes    Birth control/protection: Surgical  Other Topics Concern   Not on file  Social History Narrative   Not on file   Social Determinants of Health   Financial Resource Strain: Not on file  Food Insecurity: No Food Insecurity (11/17/2022)   Hunger Vital Sign    Worried About Running Out of Food in the Last Year: Never true    Ran Out of Food in the Last Year: Never true  Transportation Needs: No Transportation Needs (11/17/2022)   PRAPARE - Administrator, Civil Service (Medical): No    Lack of Transportation (Non-Medical): No  Physical Activity: Not on file  Stress: Not on file  Social Connections: Not on file  Intimate Partner Violence: Not At Risk (11/17/2022)   Humiliation, Afraid, Rape, and Kick questionnaire    Fear of Current or Ex-Partner: No    Emotionally Abused: No    Physically Abused: No    Sexually Abused: No  : Review of Systems  Constitutional:  Positive for malaise/fatigue.  HENT: Negative.    Eyes: Negative.   Respiratory:  Positive for cough and shortness of breath.   Cardiovascular: Negative.   Gastrointestinal: Negative.   Genitourinary: Negative.   Musculoskeletal: Negative.   Skin: Negative.   Neurological: Negative.   Endo/Heme/Allergies: Negative.   Psychiatric/Behavioral: Negative.        Exam: Her vital signs are temperature 98.2.  Pulse 86.  Blood pressure 143/73.  Weight is 140 pounds. Patient Vitals for the past 24 hrs:  BP Temp Temp src Pulse Resp SpO2  11/17/22 1259 (!) 143/73 98.2 F (36.8 C) Oral 86 20 93 %  11/17/22 0454 137/69 99.4 F (37.4 C) Oral 87 -- 94 %  11/17/22 0058 129/73 (!) 100.7 F (38.2 C) Oral 91 -- 91 %  11/16/22 2130 123/65 99.6 F (37.6 C) -- 86 20 (!) 68 %   Physical Exam Vitals reviewed.  HENT:     Head: Normocephalic and atraumatic.  Eyes:     Pupils: Pupils are equal, round, and reactive to light.  Cardiovascular:     Rate and Rhythm: Normal rate and regular rhythm.     Heart sounds: Normal heart sounds.  Pulmonary:     Effort: Pulmonary effort is normal.     Breath sounds: Normal breath sounds.  Abdominal:     General: Bowel sounds are normal.     Palpations: Abdomen is soft.  Musculoskeletal:        General: No tenderness or deformity. Normal range of motion.     Cervical back: Normal range of motion.  Lymphadenopathy:     Cervical: No cervical adenopathy.  Skin:    General: Skin is warm and dry.     Findings: No erythema or rash.  Neurological:     Mental Status: She is alert and oriented to person, place, and time.  Psychiatric:        Behavior: Behavior normal.        Thought Content: Thought content normal.        Judgment: Judgment normal.       Recent Labs    11/16/22 0849 11/17/22 0621  WBC 11.2* 11.4*  HGB 10.7* 10.0*  HCT 33.0* 30.4*  PLT 373 353    Recent Labs    11/16/22 0849  NA 137  K 3.9  CL 100  CO2 28  GLUCOSE 113*  BUN 23  CREATININE 1.26*  CALCIUM 9.2    Blood smear review: None  Pathology: None    Assessment and Plan: [name] is a very nice 72 year old Afro-American female.  She has a left upper lung mass.  She has a right adrenal mass.  She has a pulmonary embolus.  I think the real problem is whether or not this right adrenal mass is malignant.  We do not know  if this is malignant.  A biopsy will certainly help.  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  This typically is thought of in a different light with respect to stage IV disease.  If the adrenal mass was her only site of metastatic disease, then you would think that this might be amenable to surgical resection.  Again we really need to get a tissue diagnosis.  I will keep her on heparin until we know that all of the invasive studies are done.  Once we know that she is not having any additional invasive studies, then we can switch her over to an oral anticoagulant.  I would try to get a bone scan on her.  I would also get an MRI of the brain while she is in the hospital to help complete the staging.  A PET scan would definitely be useful but again this is some that would not be done with her as an inpatient.  She lives up in [place].  I would think that she can [facility].  She is iron deficient.  I am not sure why she would be iron deficient.  She has had this worked up while she is in the hospital.  She needs to have her stools checked.  She probably needs to have some type of upper and or lower endoscopy if she is not had one for a while.  I will give her some IV iron.  She has an incredible faith.  Her daughter was with [place].  It was certainly fine having fellowship with him.  We will certainly follow along for right now.  We cannot do much until we establish a tissue diagnosis.  Christin Bach, MD  Fayrene Fearing 1:5

## 2022-11-17 NOTE — Consult Note (Addendum)
Cardiology Consultation   Patient ID: Erin Pacheco MRN: [id number]; DOB: 1950-08-24  Admit date: 11/16/2022 Date of Consult: 11/17/2022  PCP:  Renaye Rakers, MD   [facility] Providers Cardiologist:  [facility] - Dr. Antoine Poche     Patient Profile:   Erin Pacheco is a 72 y.o. female with a hx of tobacco abuse and hypertension who is being seen 11/17/2022 for the evaluation of LV dysfunction at the request of Dr. Hanley Ben.  History of Present Illness:   [name] is a pleasant 72 year old female with past medical history of tobacco abuse and hypertension.  She works as a Water quality scientist.  She has been smoking since she was a teenager.  Mother passed away from old age but had ovarian cancer.  Father passed away from car accident.  She was seen by a cardiologist more than 20 years ago for heart murmur.  Echocardiogram obtained in May 2003 showed EF 55 to 65%, trivial MR.  For the past 6 to 12 months, family describe a persistent dry cough that won't go away.  However she was still able to do everyday activity without any exertional chest pain or worsening dyspnea.  She eventually sought medical attention at [facility], ED on 11/15/2022 after developing a sore spot under the left breast.  She described it as both pleuritic and exertional.  Serial troponin was negative x 2.  Initial blood work showed creatinine of 1.26.  White blood cell count 11.2.  Hemoglobin 10.7.  D-dimer was elevated at 3.14.  CTA of the chest showed segmental PE in the right upper lobe greater than left lower lobe, small amount of clot burden, emphysematous lung changes with spiculated left upper lobe lung mass measuring 2.5 cm, there is also a right adrenal mass measuring 4.5 x 2.9 cm.  Image was concerning for malignancy.  She was subsequently transferred to [facility] for further evaluation.  Echocardiogram obtained on 11/16/2022 showed EF 30 to 35%, global hypokinesis, normal RV, small  circumferential pericardial effusion, mild LAE, mild to moderate AI.  Pulmonology service has been consulted for lung mass.  Cardiology service consulted for LV dysfunction.  Talking with the patient, other than the intermittent chest pain under the left breast that started 2 days ago, she has no other experienced any anginal symptom.   Past Medical History:  Diagnosis Date   Arthritis    Heart murmur    Hypertension    Tobacco abuse     Past Surgical History:  Procedure Laterality Date   ABDOMINAL HYSTERECTOMY     BILATERAL OOPHORECTOMY     CATARACT EXTRACTION W/PHACO  12/27/2010   Procedure: CATARACT EXTRACTION PHACO AND INTRAOCULAR LENS PLACEMENT (IOC);  Surgeon: Gemma Payor;  Location: [facility];  Service: Ophthalmology;  Laterality: Right;   CATARACT EXTRACTION W/PHACO  03/28/2011   Procedure: CATARACT EXTRACTION PHACO AND INTRAOCULAR LENS PLACEMENT (IOC);  Surgeon: Gemma Payor;  Location: [facility];  Service: Ophthalmology;  Laterality: Left;  CDE 7.27     Home Medications:  Prior to Admission medications   Medication Sig Start Date End Date Taking? Authorizing Provider  diclofenac (VOLTAREN) 75 MG EC tablet Take 75 mg by mouth 2 (two) times daily as needed for mild pain. 01/27/21  Yes [provider]  ibuprofen (ADVIL,MOTRIN) 200 MG tablet Take 400-600 mg by mouth every 6 (six) hours as needed. Pain    Yes [provider]  Multiple Vitamin (MULTIVITAMIN) tablet Take 1 tablet by mouth daily.  Yes [provider]  Polyethyl Glycol-Propyl Glycol (SYSTANE OP) Apply 1 drop to eye daily as needed (dry eye). Dry Eyes   Yes [provider]  valsartan-hydrochlorothiazide (DIOVAN-HCT) 160-12.5 MG tablet Take 1 tablet by mouth daily. 12/05/20  Yes [provider]  aliskiren (TEKTURNA) 150 MG tablet Take 150 mg by mouth daily.   Patient not taking: Reported on 11/16/2022    [provider]  dexamethasone (DECADRON) 4 MG tablet Take 4 mg by mouth  daily as needed. For arthritis pain Patient not taking: Reported on 11/16/2022    [provider]  LORazepam (ATIVAN) 1 MG tablet Take 1 tablet (1 mg total) by mouth 3 (three) times daily as needed for anxiety. Patient not taking: Reported on 11/16/2022 05/30/12   Donnetta Hutching, MD    Inpatient Medications: Scheduled Meds:  amLODipine  2.5 mg Oral Daily   Chlorhexidine Gluconate Cloth  6 each Topical Q0600   fluticasone furoate-vilanterol  1 puff Inhalation Daily   lidocaine  1 patch Transdermal Q24H   nicotine  21 mg Transdermal Daily   sodium chloride flush  3 mL Intravenous Q12H   sodium chloride flush  3 mL Intravenous Q12H   umeclidinium bromide  1 puff Inhalation Daily   Continuous Infusions:  sodium chloride     heparin 1,300 Units/hr (11/17/22 0527)   PRN Meds: sodium chloride, acetaminophen **OR** acetaminophen, albuterol, ALPRAZolam, bisacodyl, fentaNYL (SUBLIMAZE) injection, guaiFENesin, hydrALAZINE, ondansetron **OR** ondansetron (ZOFRAN) IV, oxyCODONE, polyethylene glycol, polyvinyl alcohol, sodium chloride flush, traZODone  Allergies:   No Known Allergies  Social History:   Social History   Socioeconomic History   Marital status: Single    Spouse name: Not on file   Number of children: Not on file   Years of education: Not on file   Highest education level: Not on file  Occupational History   Not on file  Tobacco Use   Smoking status: Every Day    Packs/day: 0.50    Years: 30.00    Additional pack years: 0.00    Total pack years: 15.00    Types: Cigarettes   Smokeless tobacco: Never  Vaping Use   Vaping Use: Never used  Substance and Sexual Activity   Alcohol use: No   Drug use: No   Sexual activity: Yes    Birth control/protection: Surgical  Other Topics Concern   Not on file  Social History Narrative   Not on file   Social Determinants of Health   Financial Resource Strain: Not on file  Food Insecurity: No Food Insecurity (11/17/2022)    Hunger Vital Sign    Worried About Running Out of Food in the Last Year: Never true    Ran Out of Food in the Last Year: Never true  Transportation Needs: No Transportation Needs (11/17/2022)   PRAPARE - Administrator, Civil Service (Medical): No    Lack of Transportation (Non-Medical): No  Physical Activity: Not on file  Stress: Not on file  Social Connections: Not on file  Intimate Partner Violence: Not At Risk (11/17/2022)   Humiliation, Afraid, Rape, and Kick questionnaire    Fear of Current or Ex-Partner: No    Emotionally Abused: No    Physically Abused: No    Sexually Abused: No    Family History:    Family History  Problem Relation Age of Onset   Ovarian cancer Mother    Anesthesia problems Neg Hx    Hypotension Neg Hx  Malignant hyperthermia Neg Hx    Pseudochol deficiency Neg Hx      ROS:  Please see the history of present illness.   All other ROS reviewed and negative.     Physical Exam/Data:   Vitals:   11/16/22 1815 11/16/22 2130 11/17/22 0058 11/17/22 0454  BP: (!) 153/67 123/65 129/73 137/69  Pulse: 96 86 91 87  Resp: 20 20    Temp: (!) 100.5 F (38.1 C) 99.6 F (37.6 C) (!) 100.7 F (38.2 C) 99.4 F (37.4 C)  TempSrc:   Oral Oral  SpO2: 94% (!) 68% 91% 94%  Weight:      Height:        Intake/Output Summary (Last 24 hours) at 11/17/2022 1253 Last data filed at 11/17/2022 0600 Gross per 24 hour  Intake 293.16 ml  Output 800 ml  Net -506.84 ml      11/16/2022    8:28 AM 04/09/2021    3:41 PM 08/30/2017    8:24 PM  Last 3 Weights  Weight (lbs) 140 lb 144 lb 152 lb  Weight (kg) 63.504 kg 65.318 kg 68.947 kg     Body mass index is 24.03 kg/m.  General:  Well nourished, well developed, in no acute distress HEENT: normal Neck: no JVD Vascular: No carotid bruits; Distal pulses 2+ bilaterally Cardiac:  normal S1, S2; RRR; no murmur  Lungs: Diminished breath sounds in bilateral bases of the lung with intermittent crackles. Abd:  soft, nontender, no hepatomegaly  Ext: no edema Musculoskeletal:  No deformities, BUE and BLE strength normal and equal Skin: warm and dry  Neuro:  CNs 2-12 intact, no focal abnormalities noted Psych:  Normal affect   EKG:  The EKG was personally reviewed and demonstrates: Sinus rhythm with left bundle branch block Telemetry:  Telemetry was personally reviewed and demonstrates: Sinus rhythm, left bundle branch block, no significant ventricular ectopy.  Relevant CV Studies:  Echo 11/16/2022  1. Left ventricular ejection fraction, by estimation, is 30 to 35%. The  left ventricle has moderately decreased function. The left ventricle  demonstrates global hypokinesis. Left ventricular diastolic parameters are  indeterminate.   2. Right ventricular systolic function is normal. The right ventricular  size is normal.   3. Left atrial size was mildly dilated.   4. A small pericardial effusion is present. The pericardial effusion is  circumferential.   5. The mitral valve is normal in structure. No evidence of mitral valve  regurgitation. No evidence of mitral stenosis.   6. The aortic valve was not well visualized. Aortic valve regurgitation  is mild to moderate.   Laboratory Data:  High Sensitivity Troponin:   Recent Labs  Lab 11/16/22 0849 11/16/22 1238  TROPONINIHS 9 11     Chemistry Recent Labs  Lab 11/16/22 0849  NA 137  K 3.9  CL 100  CO2 28  GLUCOSE 113*  BUN 23  CREATININE 1.26*  CALCIUM 9.2  GFRNONAA 46*  ANIONGAP 9    No results for input(s): "PROT", "ALBUMIN", "AST", "ALT", "ALKPHOS", "BILITOT" in the last 168 hours. Lipids No results for input(s): "CHOL", "TRIG", "HDL", "LABVLDL", "LDLCALC", "CHOLHDL" in the last 168 hours.  Hematology Recent Labs  Lab 11/16/22 0849 11/17/22 0621  WBC 11.2* 11.4*  RBC 3.68* 3.44*  HGB 10.7* 10.0*  HCT 33.0* 30.4*  MCV 89.7 88.4  MCH 29.1 29.1  MCHC 32.4 32.9  RDW 15.1 15.1  PLT 373 353   Thyroid No results for  input(s): "TSH", "FREET4" in  the last 168 hours.  BNPNo results for input(s): "BNP", "PROBNP" in the last 168 hours.  DDimer  Recent Labs  Lab 11/16/22 0849  DDIMER 3.14*     Radiology/Studies:  VAS [place] LOWER EXTREMITY VENOUS (DVT)  Result Date: 11/17/2022  Lower Venous DVT Study Patient Name:  JASDEEP DEJARNETT  Date of Exam:   11/17/2022 Medical Rec #: [id number]         Accession #:    [id number] Date of Birth: 1950/12/13         Patient Gender: F Patient Age:   26 years Exam Location:  [facility] Procedure:      VAS [place] LOWER EXTREMITY VENOUS (DVT) Referring Phys: COURAGE EMOKPAE --------------------------------------------------------------------------------  Indications: Swelling.  Risk Factors: Confirmed PE. Anticoagulation: Heparin. Limitations: Poor ultrasound/tissue interface. Comparison Study: No prior studies. Performing Technologist: Chanda Busing RVT  Examination Guidelines: A complete evaluation includes B-mode imaging, spectral Doppler, color Doppler, and power Doppler as needed of all accessible portions of each vessel. Bilateral testing is considered an integral part of a complete examination. Limited examinations for reoccurring indications may be performed as noted. The reflux portion of the exam is performed with the patient in reverse Trendelenburg.  +---------+---------------+---------+-----------+----------+--------------+ RIGHT    CompressibilityPhasicitySpontaneityPropertiesThrombus Aging +---------+---------------+---------+-----------+----------+--------------+ CFV      Full           Yes      Yes                                 +---------+---------------+---------+-----------+----------+--------------+ SFJ      Full                                                        +---------+---------------+---------+-----------+----------+--------------+ FV Prox  Full                                                         +---------+---------------+---------+-----------+----------+--------------+ FV Mid   Full                                                        +---------+---------------+---------+-----------+----------+--------------+ FV DistalFull                                                        +---------+---------------+---------+-----------+----------+--------------+ PFV      Full                                                        +---------+---------------+---------+-----------+----------+--------------+ POP      Full           Yes  Yes                                 +---------+---------------+---------+-----------+----------+--------------+ PTV      Full                                                        +---------+---------------+---------+-----------+----------+--------------+ PERO     Full                                                        +---------+---------------+---------+-----------+----------+--------------+   +---------+---------------+---------+-----------+----------+--------------+ LEFT     CompressibilityPhasicitySpontaneityPropertiesThrombus Aging +---------+---------------+---------+-----------+----------+--------------+ CFV      Full           Yes      Yes                                 +---------+---------------+---------+-----------+----------+--------------+ SFJ      Full                                                        +---------+---------------+---------+-----------+----------+--------------+ FV Prox  Full                                                        +---------+---------------+---------+-----------+----------+--------------+ FV Mid   Full                                                        +---------+---------------+---------+-----------+----------+--------------+ FV DistalFull                                                         +---------+---------------+---------+-----------+----------+--------------+ PFV      Full                                                        +---------+---------------+---------+-----------+----------+--------------+ POP      Full           Yes      Yes                                 +---------+---------------+---------+-----------+----------+--------------+ PTV      Full                                                        +---------+---------------+---------+-----------+----------+--------------+  PERO     Full                                                        +---------+---------------+---------+-----------+----------+--------------+     Summary: RIGHT: - There is no evidence of deep vein thrombosis in the lower extremity.  - No cystic structure found in the popliteal fossa.  LEFT: - There is no evidence of deep vein thrombosis in the lower extremity.  - No cystic structure found in the popliteal fossa.  *See table(s) above for measurements and observations. Electronically signed by Lemar Livings MD on 11/17/2022 at 11:04:10 AM.    Final    ECHOCARDIOGRAM COMPLETE  Result Date: 11/16/2022    ECHOCARDIOGRAM REPORT   Patient Name:   LAURIEN WANDER Date of Exam: 11/16/2022 Medical Rec #:  [id number]        Height:       64.0 in Accession #:    [id number]       Weight:       140.0 lb Date of Birth:  1951-01-01        BSA:          1.681 m Patient Age:    71 years         BP:           158/115 mmHg Patient Gender: F                HR:           78 bpm. [facility] Procedure: 2D Echo, Cardiac Doppler and Color Doppler Indications:    Pulmonary Embolus I26.09  History:        Patient has prior history of Echocardiogram examinations, most                 recent 11/16/2001. Signs/Symptoms:Murmur; Risk                 Factors:Hypertension.  Sonographer:    Aron Baba Referring Phys: [id number] COURAGE EMOKPAE IMPRESSIONS  1. Left ventricular ejection fraction, by  estimation, is 30 to 35%. The left ventricle has moderately decreased function. The left ventricle demonstrates global hypokinesis. Left ventricular diastolic parameters are indeterminate.  2. Right ventricular systolic function is normal. The right ventricular size is normal.  3. Left atrial size was mildly dilated.  4. A small pericardial effusion is present. The pericardial effusion is circumferential.  5. The mitral valve is normal in structure. No evidence of mitral valve regurgitation. No evidence of mitral stenosis.  6. The aortic valve was not well visualized. Aortic valve regurgitation is mild to moderate. FINDINGS  Left Ventricle: Left ventricular ejection fraction, by estimation, is 30 to 35%. The left ventricle has moderately decreased function. The left ventricle demonstrates global hypokinesis. Definity contrast agent was given IV to delineate the left ventricular endocardial borders. The left ventricular internal cavity size was normal in size. There is no left ventricular hypertrophy. Left ventricular diastolic parameters are indeterminate. Right Ventricle: The right ventricular size is normal. Right vetricular wall thickness was not well visualized. Right ventricular systolic function is normal. Left Atrium: Left atrial size was mildly dilated. Right Atrium: Right atrial size was normal in size. Pericardium: A small pericardial effusion is present. The pericardial effusion is circumferential. Mitral Valve: The mitral valve is  normal in structure. No evidence of mitral valve regurgitation. No evidence of mitral valve stenosis. Tricuspid Valve: The tricuspid valve is normal in structure. Tricuspid valve regurgitation is mild . No evidence of tricuspid stenosis. Aortic Valve: The aortic valve was not well visualized. Aortic valve regurgitation is mild to moderate. Aortic regurgitation PHT measures 442 msec. Aortic valve mean gradient measures 8.0 mmHg. Aortic valve peak gradient measures 16.6 mmHg.  Aortic valve area, by VTI measures 1.49 cm. Pulmonic Valve: The pulmonic valve was not well visualized. Pulmonic valve regurgitation is not visualized. No evidence of pulmonic stenosis. Aorta: The aortic root is normal in size and structure. IAS/Shunts: The interatrial septum was not well visualized.  LEFT VENTRICLE PLAX 2D LVIDd:         4.45 cm   Diastology LVIDs:         4.00 cm   LV e' medial:    4.99 cm/s LV PW:         1.20 cm   LV E/e' medial:  38.5 LV IVS:        0.90 cm   LV e' lateral:   5.43 cm/s LVOT diam:     2.00 cm   LV E/e' lateral: 35.4 LV SV:         53 LV SV Index:   32 LVOT Area:     3.14 cm  RIGHT VENTRICLE RV S prime:     13.10 cm/s TAPSE (M-mode): 1.6 cm LEFT ATRIUM           Index        RIGHT ATRIUM           Index LA diam:      2.70 cm 1.61 cm/m   RA Area:     14.00 cm LA Vol (A2C): 36.5 ml 21.71 ml/m  RA Volume:   34.70 ml  20.64 ml/m LA Vol (A4C): 58.3 ml 34.68 ml/m  AORTIC VALVE                     PULMONIC VALVE AV Area (Vmax):    1.57 cm      PR End Diast Vel: 5.20 msec AV Area (Vmean):   1.47 cm AV Area (VTI):     1.49 cm AV Vmax:           204.00 cm/s AV Vmean:          128.000 cm/s AV VTI:            0.357 m AV Peak Grad:      16.6 mmHg AV Mean Grad:      8.0 mmHg LVOT Vmax:         102.19 cm/s LVOT Vmean:        59.811 cm/s LVOT VTI:          0.170 m LVOT/AV VTI ratio: 0.48 AI PHT:            442 msec  AORTA Ao Root diam: 3.00 cm Ao Asc diam:  3.10 cm MITRAL VALVE                TRICUSPID VALVE MV Area (PHT): 9.25 cm     TR Peak grad:   23.8 mmHg MV Decel Time: 82 msec      TR Vmax:        244.00 cm/s MR Peak grad: 7.7 mmHg MR Vmax:      139.00 cm/s   SHUNTS MV E velocity: 192.00 cm/s  Systemic  VTI:  0.17 m MV A velocity: 48.40 cm/s   Systemic Diam: 2.00 cm MV E/A ratio:  3.97 Dina Rich MD Electronically signed by Dina Rich MD Signature Date/Time: 11/16/2022/3:04:20 PM    Final    CT Angio Chest Pulmonary Embolism (PE) W or WO Contrast  Result Date:  11/16/2022 CLINICAL DATA:  Chest pain EXAM: CT ANGIOGRAPHY CHEST WITH CONTRAST TECHNIQUE: Multidetector CT imaging of the chest was performed using the standard protocol during bolus administration of intravenous contrast. Multiplanar CT image reconstructions and MIPs were obtained to evaluate the vascular anatomy. RADIATION DOSE REDUCTION: This exam was performed according to the departmental dose-optimization program which includes automated exposure control, adjustment of the mA and/or kV according to patient size and/or use of iterative reconstruction technique. CONTRAST:  75mL OMNIPAQUE IOHEXOL 350 MG/ML SOLN COMPARISON:  X-ray 11/16/2022 FINDINGS: Cardiovascular: Heart is mildly enlarged. Small pericardial effusion. Coronary artery calcifications are seen. The thoracic aorta has a normal course and caliber with scattered atherosclerotic plaque. Multiple areas of pulmonary emboli identified most striking in segmental branches in the right upper lobe such as axial series 4, image 43. Additional subtle area in the anterior left lower lobe on image 54. No large or central embolus. No signs of right heart strain at this time. Mediastinum/Nodes: Patulous esophagus. Small thyroid gland. No specific abnormal lymph node enlargement identified in the axillary regions, hilum or mediastinum. Lungs/Pleura: Centrilobular emphysematous changes are seen greatest in the upper lung zones. There is spiculated left upper lobe mass near the margin of the hilum and mediastinum measuring on series 6, image 49 at 2.5 by 2.1 cm. There is some linear opacity lung bases likely scar or atelectasis. Subtle opacity as well in the left lower lobe. Trace left pleural fluid. No pneumothorax. Breathing motion. Additional nodule in the lingula on series 6 image 86 measuring 7 mm. Upper Abdomen: Right adrenal mass identified measuring 4.5 by 2.9 cm. Nodular thickening of the left adrenal gland as well. Musculoskeletal: Degenerative changes seen  along the spine. Critical Value/emergent results were called by telephone at the time of interpretation on 11/16/2022 at 7:48 am to provider Vonita Moss , who verbally acknowledged these results. Review of the MIP images confirms the above findings. IMPRESSION: Segmental pulmonary emboli right upper lobe greater than left lower lobe. Small amount of clot burden. Mildly enlarged heart with a small pericardial effusion. 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 Separate 7 mm lingular lesion. Right adrenal mass identified at the edge of the imaging field. Malignant or metastatic lesion is possible. Recommend overall further evaluation. PET-CT scan may be useful as the next step in the workup versus sampling. Tiny left pleural effusion Aortic Atherosclerosis (ICD10-I70.0) and Emphysema (ICD10-J43.9). Electronically Signed   By: Karen Kays M.D.   On: 11/16/2022 10:52   DG Chest 2 View  Result Date: 11/16/2022 CLINICAL DATA:  chest pain EXAM: CHEST - 2 VIEW COMPARISON:  None Available. FINDINGS: No pleural effusion. No pneumothorax. No focal airspace opacity. Normal cardiac and mediastinal contours. No radiographically apparent displaced rib fractures. Visualized upper abdomen is unremarkable. Vertebral body heights are maintained. IMPRESSION: No focal airspace opacity. Electronically Signed   By: Lorenza Cambridge M.D.   On: 11/16/2022 09:21     Assessment and Plan:   New LV dysfunction: EF 30 to 35%.  Last echocardiogram in 2003 showed normal EF.  She denies any worsening dyspnea on exertion lately.  She is currently being hospitalized  due to acute PE and had incidental finding of left upper lobe lung mass and a right adrenal mass which likely suggest malignancy.  She did have localized pain under the left breast since 2 days ago.  However symptom is not associated with exertion.  EKG showed left bundle branch block.  Given potential malignancy and acute PE,  patient is not a good candidate for invasive study.  Her chest discomfort was also atypical as well.  Consider GDMT titration and repeat echocardiogram in 2 to 3 months.  Will discuss with MD, consider stop amlodipine, add low-dose Toprol-XL and ARB.  Acute pulmonary embolism: On IV heparin.  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  Patient examined.  I agree with the findings as above.  The patient reports no cardiac history although I see long standing LBBB on EKGs and a distant echo that was unremarkable.  Now presents with chest pain and she is found to have a lung mass and PEs.  She is also found to have a reduced EF.   The patient denies any new symptoms such as chest discomfort, neck or arm discomfort. There has been no new shortness of breath, PND or orthopnea. There have been no reported palpitations, presyncope or syncope. She still works and has had no symptoms.  The patient exam reveals COR:RRR , soft apical systolic murmur,  Lungs: Clear  ,  Abd: Positive bowel sounds, no rebound no guarding, Ext   Normal pulses and no edema   .  All available labs, radiology testing, previous records reviewed. Agree with documented assessment and plan.   Cardiomyopathy:  Etiology is not clear. I will begin to titrate meds with beta blockers.  Check creat in the AM and if stable will add Entresto.  Norvasc discontinued. Will plan out patient ischemia work up in the future if EF remains low after med titration or if surgery is indicated for lung mass.  PE:  Begin Eliquis once  there are no invasive procedures planned.   CKD:  Creat increasing.  Follow as above.    Risk reduction:  Aortic atherosclerosis.  Check Lipid profile.    Fayrene Fearing Amareon Phung  2:27 PM  11/17/2022

## 2022-11-17 NOTE — Progress Notes (Signed)
ANTICOAGULATION CONSULT NOTE - Follow Up Consult  Pharmacy Consult for Heparin Indication: pulmonary embolus  No Known Allergies  Patient Measurements: Height: 5\' 4"  (162.6 cm) Weight: 63.5 kg (140 lb) IBW/kg (Calculated) : 54.7 Heparin Dosing Weight: 63.5kg  Vital Signs: Temp: 98.2 F (36.8 C) (06/13 1259) Temp Source: Oral (06/13 1259) BP: 143/73 (06/13 1259) Pulse Rate: 86 (06/13 1259)  Labs: Recent Labs    11/16/22 0849 11/16/22 1238 11/16/22 2048 11/17/22 0621 11/17/22 1548  HGB 10.7*  --   --  10.0*  --   HCT 33.0*  --   --  30.4*  --   PLT 373  --   --  353  --   HEPARINUNFRC  --   --  0.15* 0.31 0.38  CREATININE 1.26*  --   --   --   --   TROPONINIHS 9 11  --   --   --     Estimated Creatinine Clearance: 35.4 mL/min (A) (by C-G formula based on SCr of 1.26 mg/dL (H)).   Assessment: AC/Heme: UFH for PE. Hep level 0.38 in goal. Anemia: acute v chronic ? Iron low 6/12, FA, B12 & Ferritin WNL 6/13: LE dopplers ordered: negative  Goal of Therapy:  Heparin level 0.3-0.7 units/ml Monitor platelets by anticoagulation protocol: Yes   Plan:  Con't IV heparin 1300 units/hr Daily HL and CBC   Foday [facility] S. Merilynn Finland, PharmD, BCPS Clinical Staff Pharmacist [email or web address] Misty Stanley Stillinger 11/17/2022,4:32 PM

## 2022-11-17 NOTE — Progress Notes (Signed)
Bilateral lower extremity venous duplex has been completed. Preliminary results can be found in CV Proc through chart review.   11/17/22 10:45 AM Olen Cordial RVT

## 2022-11-18 ENCOUNTER — Inpatient Hospital Stay (HOSPITAL_COMMUNITY): Payer: Medicare HMO

## 2022-11-18 DIAGNOSIS — I5020 Unspecified systolic (congestive) heart failure: Secondary | ICD-10-CM | POA: Diagnosis not present

## 2022-11-18 DIAGNOSIS — R918 Other nonspecific abnormal finding of lung field: Secondary | ICD-10-CM | POA: Diagnosis not present

## 2022-11-18 DIAGNOSIS — I5021 Acute systolic (congestive) heart failure: Secondary | ICD-10-CM | POA: Diagnosis not present

## 2022-11-18 DIAGNOSIS — J432 Centrilobular emphysema: Secondary | ICD-10-CM | POA: Diagnosis not present

## 2022-11-18 DIAGNOSIS — E278 Other specified disorders of adrenal gland: Secondary | ICD-10-CM | POA: Diagnosis not present

## 2022-11-18 DIAGNOSIS — I2699 Other pulmonary embolism without acute cor pulmonale: Secondary | ICD-10-CM | POA: Diagnosis not present

## 2022-11-18 LAB — HEPARIN LEVEL (UNFRACTIONATED)
Heparin Unfractionated: 0.23 IU/mL — ABNORMAL LOW (ref 0.30–0.70)
Heparin Unfractionated: 0.25 IU/mL — ABNORMAL LOW (ref 0.30–0.70)

## 2022-11-18 LAB — BASIC METABOLIC PANEL
Anion gap: 12 (ref 5–15)
BUN: 16 mg/dL (ref 8–23)
CO2: 24 mmol/L (ref 22–32)
Calcium: 8.6 mg/dL — ABNORMAL LOW (ref 8.9–10.3)
Chloride: 99 mmol/L (ref 98–111)
Creatinine, Ser: 1.04 mg/dL — ABNORMAL HIGH (ref 0.44–1.00)
GFR, Estimated: 57 mL/min — ABNORMAL LOW (ref >60)
Glucose, Bld: 97 mg/dL (ref 70–99)
Potassium: 3.7 mmol/L (ref 3.5–5.1)
Sodium: 135 mmol/L (ref 135–145)

## 2022-11-18 LAB — CBC
HCT: 32.8 % — ABNORMAL LOW (ref 36.0–46.0)
Hemoglobin: 10.8 g/dL — ABNORMAL LOW (ref 12.0–15.0)
MCH: 29.1 pg (ref 26.0–34.0)
MCHC: 32.9 g/dL (ref 30.0–36.0)
MCV: 88.4 fL (ref 80.0–100.0)
Platelets: 368 10*3/uL (ref 150–400)
RBC: 3.71 MIL/uL — ABNORMAL LOW (ref 3.87–5.11)
RDW: 15.1 % (ref 11.5–15.5)
WBC: 12.4 10*3/uL — ABNORMAL HIGH (ref 4.0–10.5)
nRBC: 0 % (ref 0.0–0.2)

## 2022-11-18 LAB — MAGNESIUM: Magnesium: 2.3 mg/dL (ref 1.7–2.4)

## 2022-11-18 LAB — PROTIME-INR
INR: 1.3 — ABNORMAL HIGH (ref 0.8–1.2)
Prothrombin Time: 16.5 seconds — ABNORMAL HIGH (ref 11.4–15.2)

## 2022-11-18 LAB — OCCULT BLOOD X 1 CARD TO LAB, STOOL: Fecal Occult Bld: NEGATIVE

## 2022-11-18 MED ORDER — GADOBUTROL 1 MMOL/ML IV SOLN
6.0000 mL | Freq: Once | INTRAVENOUS | Status: AC | PRN
  Administered 2022-11-18: 6 mL via INTRAVENOUS

## 2022-11-18 MED ORDER — TECHNETIUM TC 99M MEDRONATE IV KIT
22.0000 | PACK | Freq: Once | INTRAVENOUS | Status: AC
  Administered 2022-11-18: 22 via INTRAVENOUS

## 2022-11-18 MED ORDER — LOSARTAN POTASSIUM 25 MG PO TABS
25.0000 mg | ORAL_TABLET | Freq: Every day | ORAL | Status: DC
  Administered 2022-11-18 – 2022-11-26 (×9): 25 mg via ORAL
  Filled 2022-11-18 (×9): qty 1

## 2022-11-18 NOTE — Progress Notes (Signed)
ANTICOAGULATION CONSULT NOTE - Follow Up Consult  Pharmacy Consult for Heparin Indication: pulmonary embolus  No Known Allergies  Patient Measurements: Height: 5\' 4"  (162.6 cm) Weight: 63.5 kg (140 lb) IBW/kg (Calculated) : 54.7 Heparin Dosing Weight: 63.5kg  Vital Signs: Temp: 100 F (37.8 C) (06/14 0450) Temp Source: Oral (06/14 0450) BP: 128/61 (06/14 0450) Pulse Rate: 82 (06/14 0450)  Labs: Recent Labs    11/16/22 0849 11/16/22 1238 11/16/22 2048 11/17/22 0621 11/17/22 1548 11/18/22 0357 11/18/22 1429  HGB 10.7*  --   --  10.0*  --  10.8*  --   HCT 33.0*  --   --  30.4*  --  32.8*  --   PLT 373  --   --  353  --  368  --   LABPROT  --   --   --   --   --  16.5*  --   INR  --   --   --   --   --  1.3*  --   HEPARINUNFRC  --   --    < > 0.31 0.38 0.23* 0.25*  CREATININE 1.26*  --   --   --   --  1.04*  --   TROPONINIHS 9 11  --   --   --   --   --    < > = values in this interval not displayed.     Estimated Creatinine Clearance: 42.8 mL/min (A) (by C-G formula based on SCr of 1.04 mg/dL (H)).   Assessment: Patient present to ED with left sides chest discomfort and abdominal pain. CTA shows segmental pulmonary emboli right upper lobe greater than left lower lobe. Small amount of clot burden. No history of oral anticoagulants.   11/18/2022 HL remains subtherapeutic despite rate increase to 1450 units/hr Hgb 10.8, plts WNL No bleeding or infusion issues per RN  Goal of Therapy:  Heparin level 0.3-0.7 units/ml Monitor platelets by anticoagulation protocol: Yes   Plan:  Increase heparin drip to 1600 units/hr Recheck heparin level in 8 hours Daily HL and CBC Follow plans for long term anticoagulation  Bernadene Person, PharmD, BCPS [phone] 11/18/2022, 4:10 PM

## 2022-11-18 NOTE — Evaluation (Signed)
Physical Therapy Evaluation Patient Details Name: Erin Pacheco MRN: [id number] DOB: [birth year] Today's Date: 11/18/2022  History of Present Illness  72 year old-year-old female with history of hypertension and tobacco abuse presented with abdominal pain along with cough and productive sputum.  On presentation, D-dimer was 3.14; CTA chest showed segmental pulmonary emboli right upper lobe greater than left upper lobe along with spiculated left upper lobe mass measuring 2.5 cm and right adrenal mass.  Echo showed EF of 30 to 35% with global hypokinesis.  She was started on heparin drip  Clinical Impression  Pt is mobilizing well, she ambulated 350' pushing an IV pole without loss of balance. SpO2 89-92% on room air walking, no dyspnea, no chest pain. No further PT indicated as she is mobilizing well independently. Pt reports she has 24/7 support available from many family members. PT signing off. Mobility specialists to follow pt during hospitalization.        Recommendations for follow up therapy are one component of a multi-disciplinary discharge planning process, led by the attending physician.  Recommendations may be updated based on patient status, additional functional criteria and insurance authorization.  Follow Up Recommendations       Assistance Recommended at Discharge    Patient can return home with the following  Assistance with cooking/housework    Equipment Recommendations None recommended by PT  Recommendations for Other Services       Functional Status Assessment Patient has not had a recent decline in their functional status     Precautions / Restrictions Precautions Precautions: None Precaution Comments: denies falls in past 6 months Restrictions Weight Bearing Restrictions: No      Mobility  Bed Mobility Overal bed mobility: Modified Independent                  Transfers Overall transfer level: Independent Equipment used: None                     Ambulation/Gait Ambulation/Gait assistance: Modified independent (Device/Increase time) Gait Distance (Feet): 350 Feet Assistive device: IV Pole Gait Pattern/deviations: WFL(Within Functional Limits) Gait velocity: WNL     General Gait Details: steady, no loss of balance, SpO2 89-92% on room air, no dyspnea  Stairs            Wheelchair Mobility    Modified Rankin (Stroke Patients Only)       Balance Overall balance assessment: Independent                                           Pertinent Vitals/Pain Pain Assessment Pain Assessment: No/denies pain    Home Living Family/patient expects to be discharged to:: Private residence Living Arrangements: Children Available Help at Discharge: Family;Available 24 hours/day Type of Home: House Home Access: Stairs to enter Entrance Stairs-Rails: Can reach both;Left;Right Entrance Stairs-Number of Steps: 7   Home Layout: Two level;Able to live on main level with bedroom/bathroom Home Equipment: None      Prior Function Prior Level of Function : Independent/Modified Independent;Driving             Mobility Comments: walks without AD; works as a Comptroller ADLs Comments: independent     Higher education careers adviser        Extremity/Trunk Assessment   Upper Extremity Assessment Upper Extremity Assessment: Overall WFL for tasks assessed    Lower Extremity Assessment Lower Extremity Assessment: Overall  WFL for tasks assessed    Cervical / Trunk Assessment Cervical / Trunk Assessment: Normal  Communication   Communication: No difficulties  Cognition Arousal/Alertness: Awake/alert Behavior During Therapy: WFL for tasks assessed/performed Overall Cognitive Status: Within Functional Limits for tasks assessed                                          General Comments      Exercises     Assessment/Plan    PT Assessment Patient does not need any further PT services  PT  Problem List         PT Treatment Interventions      PT Goals (Current goals can be found in the Care Plan section)  Acute Rehab PT Goals PT Goal Formulation: All assessment and education complete, DC therapy    Frequency       Co-evaluation               AM-PAC PT "6 Clicks" Mobility  Outcome Measure Help needed turning from your back to your side while in a flat bed without using bedrails?: None Help needed moving from lying on your back to sitting on the side of a flat bed without using bedrails?: None Help needed moving to and from a bed to a chair (including a wheelchair)?: None Help needed standing up from a chair using your arms (e.g., wheelchair or bedside chair)?: None Help needed to walk in hospital room?: None Help needed climbing 3-5 steps with a railing? : None 6 Click Score: 24    End of Session Equipment Utilized During Treatment: Gait belt Activity Tolerance: Patient tolerated treatment well Patient left: in chair;with call bell/phone within reach Nurse Communication: Mobility status      Time: 5409-8119 PT Time Calculation (min) (ACUTE ONLY): 20 min   Charges:   PT Evaluation $PT Eval Moderate Complexity: 1 Mod         Tamala Ser PT 11/18/2022  Acute Rehabilitation Services  Office [phone]

## 2022-11-18 NOTE — TOC Progression Note (Addendum)
Transition of Care [facility]) - Progression Note    Patient Details  Name: LORINE WOLLAN MRN: [id number] Date of Birth: [birth year]  Transition of [facility] CM/SW Contact  Howell Rucks, RN Phone Number: 11/18/2022, 12:37 PM  Clinical Narrative:    PT eval pending, await determination. TOC will continue to follow  -3:03pm PT-no recommendation. TOC wil continue to follow    Expected Discharge Plan: Home/Self Care Barriers to Discharge: Continued Medical Work up  Expected Discharge Plan and Services       Living arrangements for the past 2 months: Single Family Home                                       Social Determinants of Health (SDOH) Interventions SDOH Screenings   Food Insecurity: No Food Insecurity (11/17/2022)  Housing: Low Risk  (11/17/2022)  Transportation Needs: No Transportation Needs (11/17/2022)  Utilities: Not At Risk (11/17/2022)  Tobacco Use: High Risk (11/17/2022)    Readmission Risk Interventions    11/17/2022    9:59 AM  Readmission Risk Prevention Plan  Post Dischage Appt Complete  Medication Screening Complete  Transportation Screening Complete

## 2022-11-18 NOTE — Consult Note (Signed)
NAME:  Erin Pacheco, MRN:  [id number], DOB:  [birth year], LOS: 2 ADMISSION DATE:  11/16/2022, CONSULTATION DATE:  11/17/2022 REFERRING MD:  Dr. Pauletta Browns, [facility], CHIEF COMPLAINT:  cough   History of Present Illness:  72 yo female smoker presented to [facility] with productive cough.  She had elevated D dimer.  CT angio chest showed multiple areas of acute pulmonary emboli, centrilobular emphysema more in upper lungs, a spiculated mass 2.5 x 2.1 cm in LUL, and 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  She was started on heparin gtt and transferred to [facility] for further management.  Pertinent  Medical History  HTN, Arthritis  Significant Hospital Events: Including procedures, antibiotic start and stop dates in addition to other pertinent events   6/12 present to [facility], start heparin gtt, transfer to [facility]  Interim History / Subjective:  She has been getting sinus congestion and coughing spells for the past several months.  She was treated for a sinus infection a couple months ago.  She was given an albuterol inhaler and this helped.  She started smoking as a teenager.  She smoked less than 1 ppd.  Several relatives of hers also smoked.  She had asthma as a child.  No hx of pneumonia or TB.  She works as a Education administrator.  Objective   Blood pressure 128/61, pulse 82, temperature 100 F (37.8 C), temperature source Oral, resp. rate 20, height 5\' 4"  (1.626 m), weight 63.5 kg, SpO2 (!) 1 %.        Intake/Output Summary (Last 24 hours) at 11/18/2022 0942 Last data filed at 11/17/2022 2300 Gross per 24 hour  Intake 461.12 ml  Output --  Net 461.12 ml   Filed Weights   11/16/22 0828  Weight: 63.5 kg    Examination:  General - alert Eyes - pupils reactive ENT - no sinus tenderness, no stridor Cardiac - regular rate/rhythm, no murmur Chest - equal breath sounds b/l, no wheezing or rales Abdomen - soft, non tender, + bowel sounds Extremities - no cyanosis, clubbing, or edema Skin - no  rashes Neuro - normal strength, moves extremities, follows commands Psych - normal mood and behavior Lymphatics - no lymphadenopathy  Assessment & Plan:   Lt upper lobe 2.5 x 2.1 cm spiculated mass with 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 - main concern is for primary lung cancer with metastatic lesion to adrenal gland versus two separate processes - ideally would like to get biopsy from adrenal gland first since if this is positive for lung cancer, then this would help with staging and avoid the need for two separate procedures  Rt adrenal mass. - IR consulted and have requested for PET scan and assessment for pheochromocytoma prior to considering biopsy of the mass  - primary team to arrange for urine metanephrines - PET scan will need to be arranged as an outpt  Acute pulmonary embolism. - can likely transition to oral anticoagulation since IR not planning any procedures until adrenal mass work up is done  Acute systolic CHF. - Echo from 11/16/22 shows EF 30 to 35%, global hypokinesis, mild LA dilation, small pericardial effusion, mild/mod AR - cardiology consulted  Centrilobular emphysema. - incruse one puff daily, breo 100 one puff daily - prn albuterol - will need PFT set up as an outpt  Tobacco abuse. - continue nicotine patch  D/w Dr. Pauletta Browns, Dr. Myna Hidalgo, and Dr. Lowella Dandy.  PCCM will f/u on 11/21/22.  Please call if help need over  the weekend.  Labs       Latest Ref Rng & Units 11/18/2022    3:57 AM 11/16/2022    8:49 AM 05/30/2012    8:32 PM  CMP  Glucose 70 - 99 mg/dL 97  161  096   BUN 8 - 23 mg/dL 16  23  21    Creatinine 0.44 - 1.00 mg/dL 0.45  4.09  8.11   Sodium 135 - 145 mmol/L 135  137  142   Potassium 3.5 - 5.1 mmol/L 3.7  3.9  3.1   Chloride 98 - 111 mmol/L 99  100  98   CO2 22 - 32 mmol/L 24  28  33   Calcium 8.9 - 10.3 mg/dL 8.6  9.2  9.8        Latest Ref Rng & Units 11/18/2022    3:57 AM 11/17/2022    6:21 AM 11/16/2022    8:49 AM  CBC  WBC 4.0 - 10.5  K/uL 12.4  11.4  11.2   Hemoglobin 12.0 - 15.0 g/dL 91.4  78.2  95.6   Hematocrit 36.0 - 46.0 % 32.8  30.4  33.0   Platelets 150 - 400 K/uL 368  353  373     Signature:  Coralyn Helling, MD [facility] Pager - [phone] or [phone] 11/18/2022, 9:42 AM

## 2022-11-18 NOTE — Progress Notes (Addendum)
Rounding Note    Patient Name: Erin Pacheco Date of Encounter: 11/18/2022  [facility] Cardiologist: Rollene Rotunda, MD   Subjective   Denies any SOB.   Inpatient Medications    Scheduled Meds:  Chlorhexidine Gluconate Cloth  6 each Topical Q0600   fluticasone furoate-vilanterol  1 puff Inhalation Daily   lidocaine  1 patch Transdermal Q24H   losartan  25 mg Oral Daily   metoprolol succinate  50 mg Oral Daily   nicotine  21 mg Transdermal Daily   sodium chloride flush  3 mL Intravenous Q12H   sodium chloride flush  3 mL Intravenous Q12H   technetium medronate  22 millicurie Intravenous Once   umeclidinium bromide  1 puff Inhalation Daily   Continuous Infusions:  sodium chloride     heparin 1,450 Units/hr (11/18/22 0636)   PRN Meds: sodium chloride, acetaminophen **OR** acetaminophen, albuterol, ALPRAZolam, bisacodyl, fentaNYL (SUBLIMAZE) injection, guaiFENesin, hydrALAZINE, ondansetron **OR** ondansetron (ZOFRAN) IV, oxyCODONE, polyethylene glycol, polyvinyl alcohol, sodium chloride flush, traZODone   Vital Signs    Vitals:   11/17/22 2148 11/18/22 0450 11/18/22 0827 11/18/22 0829  BP: 134/69 128/61    Pulse: 87 82    Resp: 18 20    Temp: 99.6 F (37.6 C) 100 F (37.8 C)    TempSrc: Oral Oral    SpO2: 95% 93% 94% (!) 1%  Weight:      Height:        Intake/Output Summary (Last 24 hours) at 11/18/2022 1044 Last data filed at 11/17/2022 2300 Gross per 24 hour  Intake 461.12 ml  Output --  Net 461.12 ml      11/16/2022    8:28 AM 04/09/2021    3:41 PM 08/30/2017    8:24 PM  Last 3 Weights  Weight (lbs) 140 lb 144 lb 152 lb  Weight (kg) 63.504 kg 65.318 kg 68.947 kg      Telemetry    NSR with bundle branch block. - Personally Reviewed  ECG    NSR with LBBB - Personally Reviewed  Physical Exam   GEN: No acute distress.   Neck: No JVD Cardiac: RRR, no murmurs, rubs, or gallops.  Respiratory: Clear to auscultation bilaterally. GI:  Soft, nontender, non-distended  MS: No edema; No deformity. Neuro:  Nonfocal  Psych: Normal affect   Labs    High Sensitivity Troponin:   Recent Labs  Lab 11/16/22 0849 11/16/22 1238  TROPONINIHS 9 11     Chemistry Recent Labs  Lab 11/16/22 0849 11/18/22 0357  NA 137 135  K 3.9 3.7  CL 100 99  CO2 28 24  GLUCOSE 113* 97  BUN 23 16  CREATININE 1.26* 1.04*  CALCIUM 9.2 8.6*  MG  --  2.3  GFRNONAA 46* 57*  ANIONGAP 9 12    Lipids No results for input(s): "CHOL", "TRIG", "HDL", "LABVLDL", "LDLCALC", "CHOLHDL" in the last 168 hours.  Hematology Recent Labs  Lab 11/16/22 0849 11/17/22 0621 11/18/22 0357  WBC 11.2* 11.4* 12.4*  RBC 3.68* 3.44* 3.71*  HGB 10.7* 10.0* 10.8*  HCT 33.0* 30.4* 32.8*  MCV 89.7 88.4 88.4  MCH 29.1 29.1 29.1  MCHC 32.4 32.9 32.9  RDW 15.1 15.1 15.1  PLT 373 353 368   Thyroid No results for input(s): "TSH", "FREET4" in the last 168 hours.  BNPNo results for input(s): "BNP", "PROBNP" in the last 168 hours.  DDimer  Recent Labs  Lab 11/16/22 0849  DDIMER 3.14*     Radiology  MR BRAIN W WO CONTRAST  Result Date: 11/18/2022 CLINICAL DATA:  72 year old female with evidence of metastatic left upper lobe lung cancer. Pulmonary emboli. Staging. EXAM: MRI HEAD WITHOUT AND WITH CONTRAST TECHNIQUE: Multiplanar, multiecho pulse sequences of the brain and surrounding structures were obtained without and with intravenous contrast. CONTRAST:  6mL GADAVIST GADOBUTROL 1 MMOL/ML IV SOLN COMPARISON:  Recent CT Chest, Abdomen, and Pelvis 11/16/2022 and 11/17/2022. Head CT 08/30/2017. FINDINGS: Brain: No restricted diffusion to suggest acute infarction. No midline shift, mass effect, evidence of mass lesion, ventriculomegaly, extra-axial collection or acute intracranial hemorrhage. Cervicomedullary junction and pituitary are within normal limits. No abnormal enhancement identified. No abnormal dural thickening or enhancement identified. Cerebral volume is  within normal limits for age. Wallace Cullens and white matter signal is within normal limits for age throughout the brain. No cortical encephalomalacia or chronic cerebral blood products identified. Vascular: Major intracranial vascular flow voids are preserved. The major dural venous sinuses are enhancing and appear to be patent. Skull and upper cervical spine: Chronic cervical spine disc and endplate degeneration with degenerative appearing endplate marrow signal changes. Background bone marrow signal is within normal limits. No suspicious osseous lesion identified. Sinuses/Orbits: Postoperative changes to both globes, otherwise negative orbits. Trace paranasal sinus mucosal thickening. Other: Mastoids are clear. Visible internal auditory structures appear normal. Numerous small but benign appearing nasopharyngeal retention cysts, T2 hyperintense and nonenhancing following contrast. Negative visible scalp and face. IMPRESSION: No metastatic disease or acute intracranial abnormality. Normal for age MRI appearance of the brain. Electronically Signed   By: Odessa Fleming M.D.   On: 11/18/2022 07:30   CT ABDOMEN PELVIS W WO CONTRAST  Result Date: 11/17/2022 CLINICAL DATA:  Spiculated 2.4 cm left upper lobe lung nodule on recent chest CT, which also partially included a right adrenal mass concerning for the possibility of lung cancer with a metastatic lesion. Today's exam is for further characterization of the right adrenal mass. EXAM: CT ABDOMEN AND PELVIS WITHOUT AND WITH CONTRAST TECHNIQUE: Multidetector CT imaging of the abdomen and pelvis was performed following the standard protocol before and following the bolus administration of intravenous contrast. RADIATION DOSE REDUCTION: This exam was performed according to the departmental dose-optimization program which includes automated exposure control, adjustment of the mA and/or kV according to patient size and/or use of iterative reconstruction technique. CONTRAST:   OMNIPAQUE IOHEXOL 300 MG/ML  SOLN COMPARISON:  Overlapping portions CT chest 11/16/2022 FINDINGS: Lower chest: Atelectasis noted along both lung bases, mildly increased in the right middle lobe compared to previous, and with some hazy non dependent peripheral opacity in the left lower lobe for example on image 12 series 4 similar to previous. Pneumonia is not excluded. Mild cardiomegaly is present. Descending thoracic aortic atherosclerosis. Hepatobiliary: Unremarkable Pancreas: Unremarkable Spleen: Unremarkable Adrenals/Urinary Tract: The left adrenal gland appears normal. 5.2 by 3.1 by 4.3 cm right adrenal mass demonstrates precontrast internal density of 33 Hounsfield units and a absolute washout of -58% and relative washout of -25%, both indeterminate. The lesion demonstrates a higher degree of enhancement peripherally rather than centrally, potentially reflecting some degree of central necrosis. Morphologically the lesion is suspicious and concerning for potential malignancy and there is some faint stranding along its inferior margin. A 1.2 cm simple cyst of the right kidney upper pole is present on image 49 series 2. No further imaging workup of this lesion is indicated. Mild scarring in the left kidney noted. Stomach/Bowel: Unremarkable Vascular/Lymphatic: Atherosclerosis is present, including aortoiliac atherosclerotic disease. There is substantial  atheromatous calcification in the superior mesenteric artery, without overt occlusion. Mild atheromatous plaque proximally in the celiac trunk without occlusion. No pathologic adenopathy. Reproductive: Uterus absent.  Adnexa unremarkable. Other: No supplemental non-categorized findings. Musculoskeletal: Dextroconvex lumbar scoliosis with rotary component. Grade 1 degenerative anterolisthesis at L2-3 and L3-4 with substantial loss of disc height and endplate sclerosis at the L4-5 and L5-S1 levels. In conjunction with intervertebral and facet spurring as well as  degenerative disc disease, there is substantial impingement at all levels between L2 and S1. Small bilateral groin hernias contain adipose tissue. IMPRESSION: 1. 5.2 cm right adrenal mass is suspicious for malignancy. Biopsy or other appropriate confirmation for example with PET-CT imaging would be suggested. 2. Aortic atherosclerosis. 3. Substantial impingement at all levels between L2 and S1 due to scoliosis, spondylosis, and degenerative disc disease. 4. Small bilateral groin hernias contain adipose tissue. 5. Mild cardiomegaly. 6. Atelectasis along both lung bases, mildly increased in the right middle lobe compared to previous, and with some hazy non dependent peripheral opacity in the left lower lobe similar to previous. Pneumonia is not excluded. Aortic Atherosclerosis (ICD10-I70.0). Electronically Signed   By: Gaylyn Rong M.D.   On: 11/17/2022 18:54   VAS [place] LOWER EXTREMITY VENOUS (DVT)  Result Date: 11/17/2022  Lower Venous DVT Study Patient Name:  MAKAELAH MAGNANT  Date of Exam:   11/17/2022 Medical Rec #: [id number]         Accession #:    [id number] Date of Birth: [birth year]         Patient Gender: F Patient Age:   72 years Exam Location:  [facility] Procedure:      VAS [place] LOWER EXTREMITY VENOUS (DVT) Referring Phys: COURAGE EMOKPAE --------------------------------------------------------------------------------  Indications: Swelling.  Risk Factors: Confirmed PE. Anticoagulation: Heparin. Limitations: Poor ultrasound/tissue interface. Comparison Study: No prior studies. Performing Technologist: Chanda Busing RVT  Examination Guidelines: A complete evaluation includes B-mode imaging, spectral Doppler, color Doppler, and power Doppler as needed of all accessible portions of each vessel. Bilateral testing is considered an integral part of a complete examination. Limited examinations for reoccurring indications may be performed as noted. The reflux portion of the exam is performed with  the patient in reverse Trendelenburg.  +---------+---------------+---------+-----------+----------+--------------+ RIGHT    CompressibilityPhasicitySpontaneityPropertiesThrombus Aging +---------+---------------+---------+-----------+----------+--------------+ CFV      Full           Yes      Yes                                 +---------+---------------+---------+-----------+----------+--------------+ SFJ      Full                                                        +---------+---------------+---------+-----------+----------+--------------+ FV Prox  Full                                                        +---------+---------------+---------+-----------+----------+--------------+ FV Mid   Full                                                        +---------+---------------+---------+-----------+----------+--------------+  FV DistalFull                                                        +---------+---------------+---------+-----------+----------+--------------+ PFV      Full                                                        +---------+---------------+---------+-----------+----------+--------------+ POP      Full           Yes      Yes                                 +---------+---------------+---------+-----------+----------+--------------+ PTV      Full                                                        +---------+---------------+---------+-----------+----------+--------------+ PERO     Full                                                        +---------+---------------+---------+-----------+----------+--------------+   +---------+---------------+---------+-----------+----------+--------------+ LEFT     CompressibilityPhasicitySpontaneityPropertiesThrombus Aging +---------+---------------+---------+-----------+----------+--------------+ CFV      Full           Yes      Yes                                  +---------+---------------+---------+-----------+----------+--------------+ SFJ      Full                                                        +---------+---------------+---------+-----------+----------+--------------+ FV Prox  Full                                                        +---------+---------------+---------+-----------+----------+--------------+ FV Mid   Full                                                        +---------+---------------+---------+-----------+----------+--------------+ FV DistalFull                                                        +---------+---------------+---------+-----------+----------+--------------+  PFV      Full                                                        +---------+---------------+---------+-----------+----------+--------------+ POP      Full           Yes      Yes                                 +---------+---------------+---------+-----------+----------+--------------+ PTV      Full                                                        +---------+---------------+---------+-----------+----------+--------------+ PERO     Full                                                        +---------+---------------+---------+-----------+----------+--------------+     Summary: RIGHT: - There is no evidence of deep vein thrombosis in the lower extremity.  - No cystic structure found in the popliteal fossa.  LEFT: - There is no evidence of deep vein thrombosis in the lower extremity.  - No cystic structure found in the popliteal fossa.  *See table(s) above for measurements and observations. Electronically signed by Lemar Livings MD on 11/17/2022 at 11:04:10 AM.    Final    ECHOCARDIOGRAM COMPLETE  Result Date: 11/16/2022    ECHOCARDIOGRAM REPORT   Patient Name:   KEERTANA BULLS Date of Exam: 11/16/2022 Medical Rec #:  [id number]        Height:       64.0 in Accession #:    [id number]       Weight:       140.0  lb Date of Birth:  16-Jun-1950        BSA:          1.681 m Patient Age:    71 years         BP:           158/115 mmHg Patient Gender: F                HR:           78 bpm. [facility] Procedure: 2D Echo, Cardiac Doppler and Color Doppler Indications:    Pulmonary Embolus I26.09  History:        Patient has prior history of Echocardiogram examinations, most                 recent 11/16/2001. Signs/Symptoms:Murmur; Risk                 Factors:Hypertension.  Sonographer:    Aron Baba Referring Phys: [id number] COURAGE EMOKPAE IMPRESSIONS  1. Left ventricular ejection fraction, by estimation, is 30 to 35%. The left ventricle has moderately decreased function. The left ventricle demonstrates global hypokinesis. Left ventricular diastolic parameters are indeterminate.  2. Right ventricular systolic function is  normal. The right ventricular size is normal.  3. Left atrial size was mildly dilated.  4. A small pericardial effusion is present. The pericardial effusion is circumferential.  5. The mitral valve is normal in structure. No evidence of mitral valve regurgitation. No evidence of mitral stenosis.  6. The aortic valve was not well visualized. Aortic valve regurgitation is mild to moderate. FINDINGS  Left Ventricle: Left ventricular ejection fraction, by estimation, is 30 to 35%. The left ventricle has moderately decreased function. The left ventricle demonstrates global hypokinesis. Definity contrast agent was given IV to delineate the left ventricular endocardial borders. The left ventricular internal cavity size was normal in size. There is no left ventricular hypertrophy. Left ventricular diastolic parameters are indeterminate. Right Ventricle: The right ventricular size is normal. Right vetricular wall thickness was not well visualized. Right ventricular systolic function is normal. Left Atrium: Left atrial size was mildly dilated. Right Atrium: Right atrial size was normal in size. Pericardium: A  small pericardial effusion is present. The pericardial effusion is circumferential. Mitral Valve: The mitral valve is normal in structure. No evidence of mitral valve regurgitation. No evidence of mitral valve stenosis. Tricuspid Valve: The tricuspid valve is normal in structure. Tricuspid valve regurgitation is mild . No evidence of tricuspid stenosis. Aortic Valve: The aortic valve was not well visualized. Aortic valve regurgitation is mild to moderate. Aortic regurgitation PHT measures 442 msec. Aortic valve mean gradient measures 8.0 mmHg. Aortic valve peak gradient measures 16.6 mmHg. Aortic valve area, by VTI measures 1.49 cm. Pulmonic Valve: The pulmonic valve was not well visualized. Pulmonic valve regurgitation is not visualized. No evidence of pulmonic stenosis. Aorta: The aortic root is normal in size and structure. IAS/Shunts: The interatrial septum was not well visualized.  LEFT VENTRICLE PLAX 2D LVIDd:         4.45 cm   Diastology LVIDs:         4.00 cm   LV e' medial:    4.99 cm/s LV PW:         1.20 cm   LV E/e' medial:  38.5 LV IVS:        0.90 cm   LV e' lateral:   5.43 cm/s LVOT diam:     2.00 cm   LV E/e' lateral: 35.4 LV SV:         53 LV SV Index:   32 LVOT Area:     3.14 cm  RIGHT VENTRICLE RV S prime:     13.10 cm/s TAPSE (M-mode): 1.6 cm LEFT ATRIUM           Index        RIGHT ATRIUM           Index LA diam:      2.70 cm 1.61 cm/m   RA Area:     14.00 cm LA Vol (A2C): 36.5 ml 21.71 ml/m  RA Volume:   34.70 ml  20.64 ml/m LA Vol (A4C): 58.3 ml 34.68 ml/m  AORTIC VALVE                     PULMONIC VALVE AV Area (Vmax):    1.57 cm      PR End Diast Vel: 5.20 msec AV Area (Vmean):   1.47 cm AV Area (VTI):     1.49 cm AV Vmax:           204.00 cm/s AV Vmean:          128.000 cm/s  AV VTI:            0.357 m AV Peak Grad:      16.6 mmHg AV Mean Grad:      8.0 mmHg LVOT Vmax:         102.19 cm/s LVOT Vmean:        59.811 cm/s LVOT VTI:          0.170 m LVOT/AV VTI ratio: 0.48 AI PHT:             442 msec  AORTA Ao Root diam: 3.00 cm Ao Asc diam:  3.10 cm MITRAL VALVE                TRICUSPID VALVE MV Area (PHT): 9.25 cm     TR Peak grad:   23.8 mmHg MV Decel Time: 82 msec      TR Vmax:        244.00 cm/s MR Peak grad: 7.7 mmHg MR Vmax:      139.00 cm/s   SHUNTS MV E velocity: 192.00 cm/s  Systemic VTI:  0.17 m MV A velocity: 48.40 cm/s   Systemic Diam: 2.00 cm MV E/A ratio:  3.97 Dina Rich MD Electronically signed by Dina Rich MD Signature Date/Time: 11/16/2022/3:04:20 PM    Final     Cardiac Studies   Echo 11/16/2022  1. Left ventricular ejection fraction, by estimation, is 30 to 35%. The  left ventricle has moderately decreased function. The left ventricle  demonstrates global hypokinesis. Left ventricular diastolic parameters are  indeterminate.   2. Right ventricular systolic function is normal. The right ventricular  size is normal.   3. Left atrial size was mildly dilated.   4. A small pericardial effusion is present. The pericardial effusion is  circumferential.   5. The mitral valve is normal in structure. No evidence of mitral valve  regurgitation. No evidence of mitral stenosis.   6. The aortic valve was not well visualized. Aortic valve regurgitation  is mild to moderate.   Patient Profile     72 y.o. female with PMH of tobacco abuse and HTN presented with chest pain and persistent cough and found to have PE, LUL lung mass and R adrenal mass on CTA of chest. Cardiology service consulted as her EF came back 30-35% on echo.   Assessment & Plan    New LV dysfunction: EF 30 to 35%.  Last echocardiogram in 2003 showed normal EF.  She denies any worsening dyspnea on exertion lately.  She is currently being hospitalized due to acute PE and had incidental finding of left upper lobe lung mass and right adrenal mass which likely suggest malignancy.  She did have localized pain under the left breast since 2 days ago.  However symptom is not associated with exertion.   EKG showed left bundle branch block.  Given potential malignancy and acute PE, patient is not a good candidate for invasive study.  Her chest discomfort was also atypical as well.   - will pursue GDMT titration and repeat echocardiogram in 2 to 3 months.  Added toprol XL yesterday. Will add losartan today. Arranged outpatient follow up at which time GDMT can continue to be titrated.   Acute pulmonary embolism: On IV heparin.  Once all invasive study completed, then will consider PE dosing of Xarelto or Eliquis   Left upper lobe lung mass and right adrenal mass: Per primary team and pulmonology service. Oncology on board, planning additional imaging w possible biopsy  For questions or updates, please contact [facility] Please consult [email or web address] for contact info under        Signed, Azalee Course, PA  11/18/2022, 10:44 AM    History and all data above reviewed.  Patient examined.  I agree with the findings as above.  No pain.  No SOB. The patient exam reveals COR:RRR  ,  Lungs: Clear  ,  Abd: Positive bowel sounds, no rebound no guarding, Ext No edema   .  All available labs, radiology testing, previous records reviewed. Agree with documented assessment and plan. Cardiomyopathy:  Plan medical management for now with addition of ARB.  We will titrate further as an out patient.  I talked with her about salt and fluid restriction which should not be a problem.  No further in patient testing is planned.   Fayrene Fearing Areanna Gengler  11:46 AM  11/18/2022

## 2022-11-18 NOTE — Progress Notes (Signed)
PROGRESS NOTE    Erin Pacheco  ZOX:[id number] DOB: 03-Dec-1950 DOA: 11/16/2022 PCP: Renaye Rakers, MD   Brief Narrative:  72 year old-year-old female with history of hypertension and tobacco abuse presented with abdominal pain along with cough and productive sputum.  On presentation, D-dimer was 3.14; CTA chest showed segmental pulmonary emboli right upper lobe greater than left upper lobe along with spiculated left upper lobe mass measuring 2.5 cm and right adrenal mass.  Echo showed EF of 30 to 35% with global hypokinesis.  She was started on heparin drip and transferred to [facility] as per patient/family request.  Pulmonary/IR/cardiology/oncology consulted.  Assessment & Plan:   Acute pulmonary embolism -Imaging as above.  Currently on heparin drip. -Lower extremity duplex ultrasound was negative for DVT. -Will transition to DOAC only after possible biopsy -Currently on room air. 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   -PCCM recommended IR consult for possible adrenal biopsy.  IR has been consulted.  Timing of adrenal biopsy to be decided by IR.  Keep n.p.o. for now. -Oncology consultation appreciated.  MRI of brain did not show any metastatic disease  Possible acute systolic heart failure -Echo showed EF of 30 to 35%.  New diagnosis.  Cardiology consulted.  Patient has been started on metoprolol succinate.  Strict input and output.  Daily weights.  Fluid restriction.  AKI versus CKD -Creatinine 1.26 on presentation.  No recent renal function available.  Creatinine 1.04 today.  Possible COPD Tobacco abuse -Pulmonary following.  Continue current inhaled regimen.  Will need outpatient PFTs -Patient was counseled regarding tobacco cessation by admitting hospitalist -Continue nicotine patch  Hypertension -Monitor blood pressure.  Continue metoprolol as per cardiology.   Amlodipine has been discontinued.  Losartan on hold.    Normocytic anemia -Possibly anemia of chronic disease.  Hemoglobin stable.  No signs of bleeding.  Leukocytosis -Possibly reactive.  DVT prophylaxis: Heparin drip Code Status: Full Family Communication: Daughter at bedside on 11/17/22 Disposition Plan: Status is: Inpatient Remains inpatient appropriate because: Of severity of illness  Consultants: Pulmonary/IR/cardiology/oncology  Procedures: Echo  Antimicrobials: None   Subjective: Patient seen and examined at bedside.  Still complains of intermittent shortness of breath and cough.  Oral intake is still not appropriate.  No vomiting, abdominal pain reported. Objective: Vitals:   11/17/22 0454 11/17/22 1259 11/17/22 2148 11/18/22 0450  BP: 137/69 (!) 143/73 134/69 128/61  Pulse: 87 86 87 82  Resp:  20 18 20   Temp: 99.4 F (37.4 C) 98.2 F (36.8 C) 99.6 F (37.6 C) 100 F (37.8 C)  TempSrc: Oral Oral Oral Oral  SpO2: 94% 93% 95% 93%  Weight:      Height:        Intake/Output Summary (Last 24 hours) at 11/18/2022 0744 Last data filed at 11/17/2022 2300 Gross per 24 hour  Intake 461.12 ml  Output --  Net 461.12 ml    Filed Weights   11/16/22 0828  Weight: 63.5 kg    Examination:  General: Currently on room air.  No distress.  Chronically ill and deconditioned looking. ENT/neck: No thyromegaly.  JVD is not elevated  respiratory: Decreased breath sounds at bases bilaterally with some crackles; no wheezing  CVS: S1-S2 heard, rate controlled currently Abdominal: Soft, nontender, slightly distended; no organomegaly,  bowel sounds are heard Extremities: Trace lower extremity edema; no cyanosis  CNS: Awake and alert.  Still slow to  respond.  No focal neurologic deficit.  Moves extremities Lymph: No obvious lymphadenopathy Skin: No obvious ecchymosis/lesions  psych: Mostly flat affect.  Currently not agitated.   Musculoskeletal: No obvious joint  swelling/deformity    Data Reviewed: I have personally reviewed following labs and imaging studies  CBC: Recent Labs  Lab 11/16/22 0849 11/17/22 0621 11/18/22 0357  WBC 11.2* 11.4* 12.4*  HGB 10.7* 10.0* 10.8*  HCT 33.0* 30.4* 32.8*  MCV 89.7 88.4 88.4  PLT 373 353 368    Basic Metabolic Panel: Recent Labs  Lab 11/16/22 0849 11/18/22 0357  NA 137 135  K 3.9 3.7  CL 100 99  CO2 28 24  GLUCOSE 113* 97  BUN 23 16  CREATININE 1.26* 1.04*  CALCIUM 9.2 8.6*  MG  --  2.3    GFR: Estimated Creatinine Clearance: 42.8 mL/min (A) (by C-G formula based on SCr of 1.04 mg/dL (H)). Liver Function Tests: No results for input(s): "AST", "ALT", "ALKPHOS", "BILITOT", "PROT", "ALBUMIN" in the last 168 hours. No results for input(s): "LIPASE", "AMYLASE" in the last 168 hours. No results for input(s): "AMMONIA" in the last 168 hours. Coagulation Profile: Recent Labs  Lab 11/18/22 0357  INR 1.3*   Cardiac Enzymes: No results for input(s): "CKTOTAL", "CKMB", "CKMBINDEX", "TROPONINI" in the last 168 hours. BNP (last 3 results) No results for input(s): "PROBNP" in the last 8760 hours. HbA1C: No results for input(s): "HGBA1C" in the last 72 hours. CBG: No results for input(s): "GLUCAP" in the last 168 hours. Lipid Profile: No results for input(s): "CHOL", "HDL", "LDLCALC", "TRIG", "CHOLHDL", "LDLDIRECT" in the last 72 hours. Thyroid Function Tests: No results for input(s): "TSH", "T4TOTAL", "FREET4", "T3FREE", "THYROIDAB" in the last 72 hours. Anemia Panel: Recent Labs    11/16/22 1238  VITAMINB12 288  FOLATE 8.8  FERRITIN 108  TIBC 244*  IRON 15*    Sepsis Labs: No results for input(s): "PROCALCITON", "LATICACIDVEN" in the last 168 hours.  No results found for this or any previous visit (from the past 240 hour(s)).       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 Pulmonary emboli. Staging. EXAM: MRI HEAD WITHOUT AND WITH CONTRAST TECHNIQUE: Multiplanar, multiecho pulse sequences of the brain and surrounding structures were obtained without and with intravenous contrast. CONTRAST:  6mL GADAVIST GADOBUTROL 1 MMOL/ML IV SOLN COMPARISON:  Recent CT Chest, Abdomen, and Pelvis 11/16/2022 and 11/17/2022. Head CT 08/30/2017. FINDINGS: Brain: No restricted diffusion to suggest acute infarction. No midline shift, mass effect, evidence of mass lesion, ventriculomegaly, extra-axial collection or acute intracranial hemorrhage. Cervicomedullary junction and pituitary are within normal limits. No abnormal enhancement identified. No abnormal dural thickening or enhancement identified. Cerebral volume is within normal limits for age. Wallace Cullens and white matter signal is within normal limits for age throughout the brain. No cortical encephalomalacia or chronic cerebral blood products identified. Vascular: Major intracranial vascular flow voids are preserved. The major dural venous sinuses are enhancing and appear to be patent. Skull and upper cervical spine: Chronic cervical spine disc and endplate degeneration with degenerative appearing endplate marrow signal changes. Background bone marrow signal is within normal limits. No suspicious osseous lesion identified. Sinuses/Orbits: Postoperative changes to both globes, otherwise negative orbits. Trace paranasal sinus mucosal thickening. Other: Mastoids are clear. Visible internal auditory structures appear normal. Numerous small but benign appearing nasopharyngeal retention cysts, T2 hyperintense and nonenhancing following contrast. Negative visible scalp  and face. IMPRESSION: No metastatic disease or acute intracranial abnormality. Normal for age MRI appearance of the brain. Electronically Signed   By: Odessa Fleming M.D.   On: 11/18/2022 07:30   CT ABDOMEN PELVIS W WO CONTRAST  Result Date: 11/17/2022 CLINICAL DATA:  Spiculated 2.4  cm left upper lobe lung nodule on recent chest CT, which also partially included a right adrenal mass concerning for the possibility of lung cancer with a metastatic lesion. Today's exam is for further characterization of the right adrenal mass. EXAM: CT ABDOMEN AND PELVIS WITHOUT AND WITH CONTRAST TECHNIQUE: Multidetector CT imaging of the abdomen and pelvis was performed following the standard protocol before and following the bolus administration of intravenous contrast. RADIATION DOSE REDUCTION: This exam was performed according to the departmental dose-optimization program which includes automated exposure control, adjustment of the mA and/or kV according to patient size and/or use of iterative reconstruction technique. CONTRAST:  OMNIPAQUE IOHEXOL 300 MG/ML  SOLN COMPARISON:  Overlapping portions CT chest 11/16/2022 FINDINGS: Lower chest: Atelectasis noted along both lung bases, mildly increased in the right middle lobe compared to previous, and with some hazy non dependent peripheral opacity in the left lower lobe for example on image 12 series 4 similar to previous. Pneumonia is not excluded. Mild cardiomegaly is present. Descending thoracic aortic atherosclerosis. Hepatobiliary: Unremarkable Pancreas: Unremarkable Spleen: Unremarkable Adrenals/Urinary Tract: The left adrenal gland appears normal. 5.2 by 3.1 by 4.3 cm right adrenal mass demonstrates precontrast internal density of 33 Hounsfield units and a absolute washout of -58% and relative washout of -25%, both indeterminate. The lesion demonstrates a higher degree of enhancement peripherally rather than centrally, potentially reflecting some degree of central necrosis. Morphologically the lesion is suspicious and concerning for potential malignancy and there is some faint stranding along its inferior margin. A 1.2 cm simple cyst of the right kidney upper pole is present on image 49 series 2. No further imaging workup of this lesion is indicated.  Mild scarring in the left kidney noted. Stomach/Bowel: Unremarkable Vascular/Lymphatic: Atherosclerosis is present, including aortoiliac atherosclerotic disease. There is substantial atheromatous calcification in the superior mesenteric artery, without overt occlusion. Mild atheromatous plaque proximally in the celiac trunk without occlusion. No pathologic adenopathy. Reproductive: Uterus absent.  Adnexa unremarkable. Other: No supplemental non-categorized findings. Musculoskeletal: Dextroconvex lumbar scoliosis with rotary component. Grade 1 degenerative anterolisthesis at L2-3 and L3-4 with substantial loss of disc height and endplate sclerosis at the L4-5 and L5-S1 levels. In conjunction with intervertebral and facet spurring as well as degenerative disc disease, there is substantial impingement at all levels between L2 and S1. Small bilateral groin hernias contain adipose tissue. IMPRESSION: 1. 5.2 cm right adrenal mass is suspicious for malignancy. Biopsy or other appropriate confirmation for example with PET-CT imaging would be suggested. 2. Aortic atherosclerosis. 3. Substantial impingement at all levels between L2 and S1 due to scoliosis, spondylosis, and degenerative disc disease. 4. Small bilateral groin hernias contain adipose tissue. 5. Mild cardiomegaly. 6. Atelectasis along both lung bases, mildly increased in the right middle lobe compared to previous, and with some hazy non dependent peripheral opacity in the left lower lobe similar to previous. Pneumonia is not excluded. Aortic Atherosclerosis (ICD10-I70.0). Electronically Signed   By: Gaylyn Rong M.D.   On: 11/17/2022 18:54   VAS [place] LOWER EXTREMITY VENOUS (DVT)  Result Date: 11/17/2022  Lower Venous DVT Study Patient Name:  Erin Pacheco  Date of Exam:   11/17/2022 Medical Rec #: [id number]  Accession #:    [id number] Date of Birth: 1951/03/24         Patient Gender: F Patient Age:   71 years Exam Location:  [facility]  Procedure:      VAS [place] LOWER EXTREMITY VENOUS (DVT) Referring Phys: COURAGE EMOKPAE --------------------------------------------------------------------------------  Indications: Swelling.  Risk Factors: Confirmed PE. Anticoagulation: Heparin. Limitations: Poor ultrasound/tissue interface. Comparison Study: No prior studies. Performing Technologist: Chanda Busing RVT  Examination Guidelines: A complete evaluation includes B-mode imaging, spectral Doppler, color Doppler, and power Doppler as needed of all accessible portions of each vessel. Bilateral testing is considered an integral part of a complete examination. Limited examinations for reoccurring indications may be performed as noted. The reflux portion of the exam is performed with the patient in reverse Trendelenburg.  +---------+---------------+---------+-----------+----------+--------------+ RIGHT    CompressibilityPhasicitySpontaneityPropertiesThrombus Aging +---------+---------------+---------+-----------+----------+--------------+ CFV      Full           Yes      Yes                                 +---------+---------------+---------+-----------+----------+--------------+ SFJ      Full                                                        +---------+---------------+---------+-----------+----------+--------------+ FV Prox  Full                                                        +---------+---------------+---------+-----------+----------+--------------+ FV Mid   Full                                                        +---------+---------------+---------+-----------+----------+--------------+ FV DistalFull                                                        +---------+---------------+---------+-----------+----------+--------------+ PFV      Full                                                        +---------+---------------+---------+-----------+----------+--------------+ POP      Full            Yes      Yes                                 +---------+---------------+---------+-----------+----------+--------------+ PTV      Full                                                        +---------+---------------+---------+-----------+----------+--------------+  PERO     Full                                                        +---------+---------------+---------+-----------+----------+--------------+   +---------+---------------+---------+-----------+----------+--------------+ LEFT     CompressibilityPhasicitySpontaneityPropertiesThrombus Aging +---------+---------------+---------+-----------+----------+--------------+ CFV      Full           Yes      Yes                                 +---------+---------------+---------+-----------+----------+--------------+ SFJ      Full                                                        +---------+---------------+---------+-----------+----------+--------------+ FV Prox  Full                                                        +---------+---------------+---------+-----------+----------+--------------+ FV Mid   Full                                                        +---------+---------------+---------+-----------+----------+--------------+ FV DistalFull                                                        +---------+---------------+---------+-----------+----------+--------------+ PFV      Full                                                        +---------+---------------+---------+-----------+----------+--------------+ POP      Full           Yes      Yes                                 +---------+---------------+---------+-----------+----------+--------------+ PTV      Full                                                        +---------+---------------+---------+-----------+----------+--------------+ PERO     Full                                                         +---------+---------------+---------+-----------+----------+--------------+  Summary: RIGHT: - There is no evidence of deep vein thrombosis in the lower extremity.  - No cystic structure found in the popliteal fossa.  LEFT: - There is no evidence of deep vein thrombosis in the lower extremity.  - No cystic structure found in the popliteal fossa.  *See table(s) above for measurements and observations. Electronically signed by Lemar Livings MD on 11/17/2022 at 11:04:10 AM.    Final    ECHOCARDIOGRAM COMPLETE  Result Date: 11/16/2022    ECHOCARDIOGRAM REPORT   Patient Name:   Erin Pacheco Date of Exam: 11/16/2022 Medical Rec #:  [id number]        Height:       64.0 in Accession #:    [id number]       Weight:       140.0 lb Date of Birth:  02/24/1951        BSA:          1.681 m Patient Age:    71 years         BP:           158/115 mmHg Patient Gender: F                HR:           78 bpm. [facility] Procedure: 2D Echo, Cardiac Doppler and Color Doppler Indications:    Pulmonary Embolus I26.09  History:        Patient has prior history of Echocardiogram examinations, most                 recent 11/16/2001. Signs/Symptoms:Murmur; Risk                 Factors:Hypertension.  Sonographer:    Aron Baba Referring Phys: [id number] COURAGE EMOKPAE IMPRESSIONS  1. Left ventricular ejection fraction, by estimation, is 30 to 35%. The left ventricle has moderately decreased function. The left ventricle demonstrates global hypokinesis. Left ventricular diastolic parameters are indeterminate.  2. Right ventricular systolic function is normal. The right ventricular size is normal.  3. Left atrial size was mildly dilated.  4. A small pericardial effusion is present. The pericardial effusion is circumferential.  5. The mitral valve is normal in structure. No evidence of mitral valve regurgitation. No evidence of mitral stenosis.  6. The aortic valve was not well visualized. Aortic valve regurgitation is mild  to moderate. FINDINGS  Left Ventricle: Left ventricular ejection fraction, by estimation, is 30 to 35%. The left ventricle has moderately decreased function. The left ventricle demonstrates global hypokinesis. Definity contrast agent was given IV to delineate the left ventricular endocardial borders. The left ventricular internal cavity size was normal in size. There is no left ventricular hypertrophy. Left ventricular diastolic parameters are indeterminate. Right Ventricle: The right ventricular size is normal. Right vetricular wall thickness was not well visualized. Right ventricular systolic function is normal. Left Atrium: Left atrial size was mildly dilated. Right Atrium: Right atrial size was normal in size. Pericardium: A small pericardial effusion is present. The pericardial effusion is circumferential. Mitral Valve: The mitral valve is normal in structure. No evidence of mitral valve regurgitation. No evidence of mitral valve stenosis. Tricuspid Valve: The tricuspid valve is normal in structure. Tricuspid valve regurgitation is mild . No evidence of tricuspid stenosis. Aortic Valve: The aortic valve was not well visualized. Aortic valve regurgitation is mild to moderate. Aortic regurgitation PHT measures 442 msec. Aortic valve mean gradient measures 8.0 mmHg. Aortic valve  peak gradient measures 16.6 mmHg. Aortic valve area, by VTI measures 1.49 cm. Pulmonic Valve: The pulmonic valve was not well visualized. Pulmonic valve regurgitation is not visualized. No evidence of pulmonic stenosis. Aorta: The aortic root is normal in size and structure. IAS/Shunts: The interatrial septum was not well visualized.  LEFT VENTRICLE PLAX 2D LVIDd:         4.45 cm   Diastology LVIDs:         4.00 cm   LV e' medial:    4.99 cm/s LV PW:         1.20 cm   LV E/e' medial:  38.5 LV IVS:        0.90 cm   LV e' lateral:   5.43 cm/s LVOT diam:     2.00 cm   LV E/e' lateral: 35.4 LV SV:         53 LV SV Index:   32 LVOT Area:      3.14 cm  RIGHT VENTRICLE RV S prime:     13.10 cm/s TAPSE (M-mode): 1.6 cm LEFT ATRIUM           Index        RIGHT ATRIUM           Index LA diam:      2.70 cm 1.61 cm/m   RA Area:     14.00 cm LA Vol (A2C): 36.5 ml 21.71 ml/m  RA Volume:   34.70 ml  20.64 ml/m LA Vol (A4C): 58.3 ml 34.68 ml/m  AORTIC VALVE                     PULMONIC VALVE AV Area (Vmax):    1.57 cm      PR End Diast Vel: 5.20 msec AV Area (Vmean):   1.47 cm AV Area (VTI):     1.49 cm AV Vmax:           204.00 cm/s AV Vmean:          128.000 cm/s AV VTI:            0.357 m AV Peak Grad:      16.6 mmHg AV Mean Grad:      8.0 mmHg LVOT Vmax:         102.19 cm/s LVOT Vmean:        59.811 cm/s LVOT VTI:          0.170 m LVOT/AV VTI ratio: 0.48 AI PHT:            442 msec  AORTA Ao Root diam: 3.00 cm Ao Asc diam:  3.10 cm MITRAL VALVE                TRICUSPID VALVE MV Area (PHT): 9.25 cm     TR Peak grad:   23.8 mmHg MV Decel Time: 82 msec      TR Vmax:        244.00 cm/s MR Peak grad: 7.7 mmHg MR Vmax:      139.00 cm/s   SHUNTS MV E velocity: 192.00 cm/s  Systemic VTI:  0.17 m MV A velocity: 48.40 cm/s   Systemic Diam: 2.00 cm MV E/A ratio:  3.97 Dina Rich MD Electronically signed by Dina Rich MD Signature Date/Time: 11/16/2022/3:04:20 PM    Final    CT Angio Chest Pulmonary Embolism (PE) W or WO Contrast  Result Date: 11/16/2022 CLINICAL DATA:  Chest pain EXAM: CT ANGIOGRAPHY CHEST WITH CONTRAST TECHNIQUE:  Multidetector CT imaging of the chest was performed using the standard protocol during bolus administration of intravenous contrast. Multiplanar CT image reconstructions and MIPs were obtained to evaluate the vascular anatomy. RADIATION DOSE REDUCTION: This exam was performed according to the departmental dose-optimization program which includes automated exposure control, adjustment of the mA and/or kV according to patient size and/or use of iterative reconstruction technique. CONTRAST:  75mL OMNIPAQUE IOHEXOL 350 MG/ML  SOLN COMPARISON:  X-ray 11/16/2022 FINDINGS: Cardiovascular: Heart is mildly enlarged. Small pericardial effusion. Coronary artery calcifications are seen. The thoracic aorta has a normal course and caliber with scattered atherosclerotic plaque. Multiple areas of pulmonary emboli identified most striking in segmental branches in the right upper lobe such as axial series 4, image 43. Additional subtle area in the anterior left lower lobe on image 54. No large or central embolus. No signs of right heart strain at this time. Mediastinum/Nodes: Patulous esophagus. Small thyroid gland. No specific abnormal lymph node enlargement identified in the axillary regions, hilum or mediastinum. Lungs/Pleura: Centrilobular emphysematous changes are seen greatest in the upper lung zones. There is spiculated left upper lobe mass near the margin of the hilum and mediastinum measuring on series 6, image 49 at 2.5 by 2.1 cm. There is some linear opacity lung bases likely scar or atelectasis. Subtle opacity as well in the left lower lobe. Trace left pleural fluid. No pneumothorax. Breathing motion. Additional nodule in the lingula on series 6 image 86 measuring 7 mm. Upper Abdomen: Right adrenal mass identified measuring 4.5 by 2.9 cm. Nodular thickening of the left adrenal gland as well. Musculoskeletal: Degenerative changes seen along the spine. Critical Value/emergent results were called by telephone at the time of interpretation on 11/16/2022 at 7:48 am to provider Vonita Moss , who verbally acknowledged these results. Review of the MIP images confirms the above findings. IMPRESSION: Segmental pulmonary emboli right upper lobe greater than left lower lobe. Small amount of clot burden. Mildly enlarged heart with a small pericardial effusion. 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 Separate 7 mm lingular lesion. Right adrenal mass identified at the edge of the imaging field.  Malignant or metastatic lesion is possible. Recommend overall further evaluation. PET-CT scan may be useful as the next step in the workup versus sampling. Tiny left pleural effusion Aortic Atherosclerosis (ICD10-I70.0) and Emphysema (ICD10-J43.9). Electronically Signed   By: Karen Kays M.D.   On: 11/16/2022 10:52   DG Chest 2 View  Result Date: 11/16/2022 CLINICAL DATA:  chest pain EXAM: CHEST - 2 VIEW COMPARISON:  None Available. FINDINGS: No pleural effusion. No pneumothorax. No focal airspace opacity. Normal cardiac and mediastinal contours. No radiographically apparent displaced rib fractures. Visualized upper abdomen is unremarkable. Vertebral body heights are maintained. IMPRESSION: No focal airspace opacity. Electronically Signed   By: Lorenza Cambridge M.D.   On: 11/16/2022 09:21        Scheduled Meds:  Chlorhexidine Gluconate Cloth  6 each Topical Q0600   fluticasone furoate-vilanterol  1 puff Inhalation Daily   lidocaine  1 patch Transdermal Q24H   metoprolol succinate  50 mg Oral Daily   nicotine  21 mg Transdermal Daily   sodium chloride flush  3 mL Intravenous Q12H   sodium chloride flush  3 mL Intravenous Q12H   umeclidinium bromide  1 puff Inhalation Daily   Continuous Infusions:  sodium chloride     heparin 1,450 Units/hr (11/18/22 0636)  Glade Lloyd, MD [facility] 11/18/2022, 7:44 AM

## 2022-11-18 NOTE — Progress Notes (Addendum)
Patient ID: Erin Pacheco, female   DOB: Dec 09, 1950, 72 y.o.   MRN: [id number] Request received for right adrenal mass biopsy in pt. Latest imaging studies were reviewed by Dr.Henn.  He recommends obtaining PET scan and undergoing lab evaluation to rule out pheochromocytoma prior to considering biopsy.  Above discussed with patient and care providers.

## 2022-11-18 NOTE — Progress Notes (Signed)
ANTICOAGULATION CONSULT NOTE - Follow Up Consult  Pharmacy Consult for Heparin Indication: pulmonary embolus  No Known Allergies  Patient Measurements: Height: 5\' 4"  (162.6 cm) Weight: 63.5 kg (140 lb) IBW/kg (Calculated) : 54.7 Heparin Dosing Weight: 63.5kg  Vital Signs: Temp: 100 F (37.8 C) (06/14 0450) Temp Source: Oral (06/14 0450) BP: 128/61 (06/14 0450) Pulse Rate: 82 (06/14 0450)  Labs: Recent Labs    11/16/22 0849 11/16/22 1238 11/16/22 2048 11/17/22 0621 11/17/22 1548 11/18/22 0357  HGB 10.7*  --   --  10.0*  --  10.8*  HCT 33.0*  --   --  30.4*  --  32.8*  PLT 373  --   --  353  --  368  LABPROT  --   --   --   --   --  16.5*  INR  --   --   --   --   --  1.3*  HEPARINUNFRC  --   --    < > 0.31 0.38 0.23*  CREATININE 1.26*  --   --   --   --  1.04*  TROPONINIHS 9 11  --   --   --   --    < > = values in this interval not displayed.     Estimated Creatinine Clearance: 42.8 mL/min (A) (by C-G formula based on SCr of 1.04 mg/dL (H)).   Assessment: AC/Heme: UFH for PE. Hep level 0.38 in goal. Anemia: acute v chronic ? Iron low 6/12, FA, B12 & Ferritin WNL 6/13: LE dopplers ordered: negative  11/18/2022 HL 0.23 subtherapeutic on 1300 units/hr Hgb 10.8, plts WNL No bleeding reported  Goal of Therapy:  Heparin level 0.3-0.7 units/ml Monitor platelets by anticoagulation protocol: Yes   Plan:  Increase heparin drip to 1450 units/hr Heparin level in 8 hours Daily HL and CBC   Arley Phenix RPh 11/18/2022, 5:11 AM

## 2022-11-19 DIAGNOSIS — E278 Other specified disorders of adrenal gland: Secondary | ICD-10-CM | POA: Diagnosis not present

## 2022-11-19 DIAGNOSIS — R918 Other nonspecific abnormal finding of lung field: Secondary | ICD-10-CM | POA: Diagnosis not present

## 2022-11-19 DIAGNOSIS — I420 Dilated cardiomyopathy: Secondary | ICD-10-CM | POA: Diagnosis not present

## 2022-11-19 DIAGNOSIS — I2699 Other pulmonary embolism without acute cor pulmonale: Secondary | ICD-10-CM | POA: Diagnosis not present

## 2022-11-19 DIAGNOSIS — I5020 Unspecified systolic (congestive) heart failure: Secondary | ICD-10-CM | POA: Diagnosis not present

## 2022-11-19 LAB — HEPARIN LEVEL (UNFRACTIONATED)
Heparin Unfractionated: 0.44 IU/mL (ref 0.30–0.70)
Heparin Unfractionated: 0.46 IU/mL (ref 0.30–0.70)

## 2022-11-19 LAB — CBC
HCT: 29.8 % — ABNORMAL LOW (ref 36.0–46.0)
Hemoglobin: 9.9 g/dL — ABNORMAL LOW (ref 12.0–15.0)
MCH: 29.1 pg (ref 26.0–34.0)
MCHC: 33.2 g/dL (ref 30.0–36.0)
MCV: 87.6 fL (ref 80.0–100.0)
Platelets: 356 10*3/uL (ref 150–400)
RBC: 3.4 MIL/uL — ABNORMAL LOW (ref 3.87–5.11)
RDW: 15 % (ref 11.5–15.5)
WBC: 11.8 10*3/uL — ABNORMAL HIGH (ref 4.0–10.5)
nRBC: 0 % (ref 0.0–0.2)

## 2022-11-19 LAB — OCCULT BLOOD X 1 CARD TO LAB, STOOL: Fecal Occult Bld: NEGATIVE

## 2022-11-19 NOTE — Progress Notes (Signed)
ANTICOAGULATION CONSULT NOTE - Follow Up Consult  Pharmacy Consult for Heparin Indication: pulmonary embolus  No Known Allergies  Patient Measurements: Height: 5\' 4"  (162.6 cm) Weight: 63.5 kg (140 lb) IBW/kg (Calculated) : 54.7 Heparin Dosing Weight: 63.5kg  Vital Signs: Temp: 99.3 F (37.4 C) (06/14 2206) Temp Source: Oral (06/14 2206) BP: 125/69 (06/14 2206) Pulse Rate: 74 (06/14 2206)  Labs: Recent Labs    11/16/22 0849 11/16/22 1238 11/16/22 2048 11/17/22 0621 11/17/22 1548 11/18/22 0357 11/18/22 1429 11/19/22 0017  HGB 10.7*  --   --  10.0*  --  10.8*  --   --   HCT 33.0*  --   --  30.4*  --  32.8*  --   --   PLT 373  --   --  353  --  368  --   --   LABPROT  --   --   --   --   --  16.5*  --   --   INR  --   --   --   --   --  1.3*  --   --   HEPARINUNFRC  --   --    < > 0.31   < > 0.23* 0.25* 0.46  CREATININE 1.26*  --   --   --   --  1.04*  --   --   TROPONINIHS 9 11  --   --   --   --   --   --    < > = values in this interval not displayed.     Estimated Creatinine Clearance: 42.8 mL/min (A) (by C-G formula based on SCr of 1.04 mg/dL (H)).   Assessment: Patient present to ED with left sides chest discomfort and abdominal pain. CTA shows segmental pulmonary emboli right upper lobe greater than left lower lobe. Small amount of clot burden. No history of oral anticoagulants.   11/19/2022 HL 0.46 therapeutic on 1600 units/hr No bleeding noted  Goal of Therapy:  Heparin level 0.3-0.7 units/ml Monitor platelets by anticoagulation protocol: Yes   Plan:  continue heparin drip at 1600 units/hr Confirmatory heparin level in 8 hours Daily HL and CBC Follow plans for long term anticoagulation  Arley Phenix RPh 11/19/2022, 12:50 AM

## 2022-11-19 NOTE — Progress Notes (Signed)
PROGRESS NOTE    Erin Pacheco  ZOX:[id number] DOB: [birth year] DOA: 11/16/2022 PCP: Renaye Rakers, MD   Brief Narrative:  72 year old-year-old female with history of hypertension and tobacco abuse presented with abdominal pain along with cough and productive sputum.  On presentation, D-dimer was 3.14; CTA chest showed segmental pulmonary emboli right upper lobe greater than left upper lobe along with spiculated left upper lobe mass measuring 2.5 cm and right adrenal mass.  Echo showed EF of 30 to 35% with global hypokinesis.  She was started on heparin drip and transferred to [facility] as per patient/family request.  Pulmonary/IR/cardiology/oncology consulted.  Assessment & Plan:   Acute pulmonary embolism -Imaging as above.  Currently on heparin drip. -Lower extremity duplex ultrasound was negative for DVT. -Will transition to DOAC once cleared by oncology -Currently on room air. 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   -PCCM recommended IR consult for possible adrenal biopsy.  IR consulted: Recommended ruling out pheochromocytoma before proceeding with adrenal biopsy.  Plasma metanephrines and 24-hour urine metanephrines ordered: Results pending.   -Oncology following: Will follow-up with oncology regarding inpatient versus outpatient continuation of workup.  MRI of brain did not show any metastatic disease.  Nuclear medicine whole-body scan was negative for osseous metastatic disease.  Possible acute systolic heart failure -Echo showed EF of 30 to 35%.  New diagnosis.  Cardiology following.  Currently on metoprolol succinate and losartan.  Strict input and output.  Daily weights.  Fluid restriction.  AKI versus CKD -Creatinine 1.26 on presentation.  No recent renal function available.  Creatinine 1.04 on 11/18/2022.  Possible COPD Tobacco abuse -Pulmonary following: Will follow-up  on 11/21/2022 if still inpatient.  Continue current inhaled regimen.  Will need outpatient PFTs -Patient was counseled regarding tobacco cessation by admitting hospitalist -Continue nicotine patch  Hypertension -Monitor blood pressure.  Continue metoprolol as per cardiology.  Amlodipine has been discontinued.  Losartan on hold.    Normocytic anemia -Possibly anemia of chronic disease.  Hemoglobin stable.  No signs of bleeding.  Leukocytosis -Possibly reactive.  Improving  DVT prophylaxis: Heparin drip Code Status: Full Family Communication: Daughter at bedside  Disposition Plan: Status is: Inpatient Remains inpatient appropriate because: Of severity of illness  Consultants: Pulmonary/IR/cardiology/oncology  Procedures: Echo  Antimicrobials: None   Subjective: Patient seen and examined at bedside.  No fever, vomiting, chest pain reported.  Still short of breath with mild exertion with some cough.   Objective: Vitals:   11/18/22 0829 11/18/22 2206 11/19/22 0534 11/19/22 0806  BP:  125/69 135/77   Pulse:  74 72   Resp:  18 20 18   Temp:  99.3 F (37.4 C) 98.9 F (37.2 C)   TempSrc:  Oral Oral   SpO2: (!) 1% 92% 96% 95%  Weight:   62 kg   Height:        Intake/Output Summary (Last 24 hours) at 11/19/2022 0809 Last data filed at 11/19/2022 0700 Gross per 24 hour  Intake 953.12 ml  Output 380 ml  Net 573.12 ml    Filed Weights   11/16/22 0828 11/19/22 0534  Weight: 63.5 kg 62 kg    Examination:  General: No distress currently.  On room air.  Chronically ill and deconditioned looking. ENT/neck: No obvious JVD elevation or neck masses palpable  respiratory: Bilateral decreased breath sounds at bases with scattered crackles  CVS: Rate mostly controlled; S1  and S2 are heard  abdominal: Soft, nontender, distended mildly; no organomegaly,  bowel sounds are heard normally Extremities: No clubbing; mild lower extremity edema present  CNS: Alert and awake.  Slow to  respond.  No focal neurologic deficit.  Able to move extremities  lymph: No palpable lymphadenopathy Skin: No obvious rashes/petechiae psych: Not agitated.  Flat affect. Musculoskeletal: No obvious joint tenderness/erythema   Data Reviewed: I have personally reviewed following labs and imaging studies  CBC: Recent Labs  Lab 11/16/22 0849 11/17/22 0621 11/18/22 0357 11/19/22 0438  WBC 11.2* 11.4* 12.4* 11.8*  HGB 10.7* 10.0* 10.8* 9.9*  HCT 33.0* 30.4* 32.8* 29.8*  MCV 89.7 88.4 88.4 87.6  PLT 373 353 368 356    Basic Metabolic Panel: Recent Labs  Lab 11/16/22 0849 11/18/22 0357  NA 137 135  K 3.9 3.7  CL 100 99  CO2 28 24  GLUCOSE 113* 97  BUN 23 16  CREATININE 1.26* 1.04*  CALCIUM 9.2 8.6*  MG  --  2.3    GFR: Estimated Creatinine Clearance: 42.8 mL/min (A) (by C-G formula based on SCr of 1.04 mg/dL (H)). Liver Function Tests: No results for input(s): "AST", "ALT", "ALKPHOS", "BILITOT", "PROT", "ALBUMIN" in the last 168 hours. No results for input(s): "LIPASE", "AMYLASE" in the last 168 hours. No results for input(s): "AMMONIA" in the last 168 hours. Coagulation Profile: Recent Labs  Lab 11/18/22 0357  INR 1.3*    Cardiac Enzymes: No results for input(s): "CKTOTAL", "CKMB", "CKMBINDEX", "TROPONINI" in the last 168 hours. BNP (last 3 results) No results for input(s): "PROBNP" in the last 8760 hours. HbA1C: No results for input(s): "HGBA1C" in the last 72 hours. CBG: No results for input(s): "GLUCAP" in the last 168 hours. Lipid Profile: No results for input(s): "CHOL", "HDL", "LDLCALC", "TRIG", "CHOLHDL", "LDLDIRECT" in the last 72 hours. Thyroid Function Tests: No results for input(s): "TSH", "T4TOTAL", "FREET4", "T3FREE", "THYROIDAB" in the last 72 hours. Anemia Panel: Recent Labs    11/16/22 1238  VITAMINB12 288  FOLATE 8.8  FERRITIN 108  TIBC 244*  IRON 15*    Sepsis Labs: No results for input(s): "PROCALCITON", "LATICACIDVEN" in the  last 168 hours.  No results found for this or any previous visit (from the past 240 hour(s)).       Radiology Studies: NM Bone Scan Whole Body  Result Date: 11/18/2022 CLINICAL DATA:  Lung cancer staging EXAM: NUCLEAR MEDICINE WHOLE BODY BONE SCAN TECHNIQUE: Whole body anterior and posterior images were obtained approximately 3 hours after intravenous injection of radiopharmaceutical. RADIOPHARMACEUTICALS:  22.0 mCi Technetium-69m MDP IV COMPARISON:  Chest CT 11/16/2022.  Abdomen pelvis CT 11/17/2022 FINDINGS: There is physiologic distribution radiotracer along the kidneys and bladder. Presumed injection site along the right antecubital region. Degenerative type areas of uptake seen along the left shoulder greater than right, midthoracic, lower lumbar spine and along the sacroiliac joint regions. Also degenerative type uptake along the ankles and feet. No additional areas of abnormal radiotracer uptake to suggest osseous metastatic disease. IMPRESSION: Multifocal degenerative type uptake. No scintigraphic evidence of osseous metastatic disease. Electronically Signed   By: Karen Kays M.D.   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 Pulmonary emboli. Staging. EXAM: MRI HEAD WITHOUT AND WITH CONTRAST TECHNIQUE: Multiplanar, multiecho pulse sequences of the brain and surrounding structures were obtained without and with intravenous contrast. CONTRAST:  6mL GADAVIST GADOBUTROL  1 MMOL/ML IV SOLN COMPARISON:  Recent CT Chest, Abdomen, and Pelvis 11/16/2022 and 11/17/2022. Head CT 08/30/2017. FINDINGS: Brain: No restricted diffusion to suggest acute infarction. No midline shift, mass effect, evidence of mass lesion, ventriculomegaly, extra-axial collection or acute intracranial hemorrhage. Cervicomedullary junction and pituitary are within normal limits. No abnormal enhancement  identified. No abnormal dural thickening or enhancement identified. Cerebral volume is within normal limits for age. Wallace Cullens and white matter signal is within normal limits for age throughout the brain. No cortical encephalomalacia or chronic cerebral blood products identified. Vascular: Major intracranial vascular flow voids are preserved. The major dural venous sinuses are enhancing and appear to be patent. Skull and upper cervical spine: Chronic cervical spine disc and endplate degeneration with degenerative appearing endplate marrow signal changes. Background bone marrow signal is within normal limits. No suspicious osseous lesion identified. Sinuses/Orbits: Postoperative changes to both globes, otherwise negative orbits. Trace paranasal sinus mucosal thickening. Other: Mastoids are clear. Visible internal auditory structures appear normal. Numerous small but benign appearing nasopharyngeal retention cysts, T2 hyperintense and nonenhancing following contrast. Negative visible scalp and face. IMPRESSION: No metastatic disease or acute intracranial abnormality. Normal for age MRI appearance of the brain. Electronically Signed   By: Odessa Fleming M.D.   On: 11/18/2022 07:30   CT ABDOMEN PELVIS W WO CONTRAST  Result Date: 11/17/2022 CLINICAL DATA:  Spiculated 2.4 cm left upper lobe lung nodule on recent chest CT, which also partially included a right adrenal mass concerning for the possibility of lung cancer with a metastatic lesion. Today's exam is for further characterization of the right adrenal mass. EXAM: CT ABDOMEN AND PELVIS WITHOUT AND WITH CONTRAST TECHNIQUE: Multidetector CT imaging of the abdomen and pelvis was performed following the standard protocol before and following the bolus administration of intravenous contrast. RADIATION DOSE REDUCTION: This exam was performed according to the departmental dose-optimization program which includes automated exposure control, adjustment of the mA and/or kV according  to patient size and/or use of iterative reconstruction technique. CONTRAST:  OMNIPAQUE IOHEXOL 300 MG/ML  SOLN COMPARISON:  Overlapping portions CT chest 11/16/2022 FINDINGS: Lower chest: Atelectasis noted along both lung bases, mildly increased in the right middle lobe compared to previous, and with some hazy non dependent peripheral opacity in the left lower lobe for example on image 12 series 4 similar to previous. Pneumonia is not excluded. Mild cardiomegaly is present. Descending thoracic aortic atherosclerosis. Hepatobiliary: Unremarkable Pancreas: Unremarkable Spleen: Unremarkable Adrenals/Urinary Tract: The left adrenal gland appears normal. 5.2 by 3.1 by 4.3 cm right adrenal mass demonstrates precontrast internal density of 33 Hounsfield units and a absolute washout of -58% and relative washout of -25%, both indeterminate. The lesion demonstrates a higher degree of enhancement peripherally rather than centrally, potentially reflecting some degree of central necrosis. Morphologically the lesion is suspicious and concerning for potential malignancy and there is some faint stranding along its inferior margin. A 1.2 cm simple cyst of the right kidney upper pole is present on image 49 series 2. No further imaging workup of this lesion is indicated. Mild scarring in the left kidney noted. Stomach/Bowel: Unremarkable Vascular/Lymphatic: Atherosclerosis is present, including aortoiliac atherosclerotic disease. There is substantial atheromatous calcification in the superior mesenteric artery, without overt occlusion. Mild atheromatous plaque proximally in the celiac trunk without occlusion. No pathologic adenopathy. Reproductive: Uterus absent.  Adnexa unremarkable. Other: No supplemental non-categorized findings. Musculoskeletal: Dextroconvex lumbar scoliosis with rotary component. Grade 1 degenerative anterolisthesis at L2-3 and L3-4 with substantial loss of disc height and  endplate sclerosis at the L4-5 and  L5-S1 levels. In conjunction with intervertebral and facet spurring as well as degenerative disc disease, there is substantial impingement at all levels between L2 and S1. Small bilateral groin hernias contain adipose tissue. IMPRESSION: 1. 5.2 cm right adrenal mass is suspicious for malignancy. Biopsy or other appropriate confirmation for example with PET-CT imaging would be suggested. 2. Aortic atherosclerosis. 3. Substantial impingement at all levels between L2 and S1 due to scoliosis, spondylosis, and degenerative disc disease. 4. Small bilateral groin hernias contain adipose tissue. 5. Mild cardiomegaly. 6. Atelectasis along both lung bases, mildly increased in the right middle lobe compared to previous, and with some hazy non dependent peripheral opacity in the left lower lobe similar to previous. Pneumonia is not excluded. Aortic Atherosclerosis (ICD10-I70.0). Electronically Signed   By: Gaylyn Rong M.D.   On: 11/17/2022 18:54   VAS [place] LOWER EXTREMITY VENOUS (DVT)  Result Date: 11/17/2022  Lower Venous DVT Study Patient Name:  Erin Pacheco  Date of Exam:   11/17/2022 Medical Rec #: [id number]         Accession #:    [id number] Date of Birth: 1950/08/26         Patient Gender: F Patient Age:   50 years Exam Location:  [facility] Procedure:      VAS [place] LOWER EXTREMITY VENOUS (DVT) Referring Phys: COURAGE EMOKPAE --------------------------------------------------------------------------------  Indications: Swelling.  Risk Factors: Confirmed PE. Anticoagulation: Heparin. Limitations: Poor ultrasound/tissue interface. Comparison Study: No prior studies. Performing Technologist: Chanda Busing RVT  Examination Guidelines: A complete evaluation includes B-mode imaging, spectral Doppler, color Doppler, and power Doppler as needed of all accessible portions of each vessel. Bilateral testing is considered an integral part of a complete examination. Limited examinations for reoccurring indications  may be performed as noted. The reflux portion of the exam is performed with the patient in reverse Trendelenburg.  +---------+---------------+---------+-----------+----------+--------------+ RIGHT    CompressibilityPhasicitySpontaneityPropertiesThrombus Aging +---------+---------------+---------+-----------+----------+--------------+ CFV      Full           Yes      Yes                                 +---------+---------------+---------+-----------+----------+--------------+ SFJ      Full                                                        +---------+---------------+---------+-----------+----------+--------------+ FV Prox  Full                                                        +---------+---------------+---------+-----------+----------+--------------+ FV Mid   Full                                                        +---------+---------------+---------+-----------+----------+--------------+ FV DistalFull                                                        +---------+---------------+---------+-----------+----------+--------------+  PFV      Full                                                        +---------+---------------+---------+-----------+----------+--------------+ POP      Full           Yes      Yes                                 +---------+---------------+---------+-----------+----------+--------------+ PTV      Full                                                        +---------+---------------+---------+-----------+----------+--------------+ PERO     Full                                                        +---------+---------------+---------+-----------+----------+--------------+   +---------+---------------+---------+-----------+----------+--------------+ LEFT     CompressibilityPhasicitySpontaneityPropertiesThrombus Aging +---------+---------------+---------+-----------+----------+--------------+ CFV       Full           Yes      Yes                                 +---------+---------------+---------+-----------+----------+--------------+ SFJ      Full                                                        +---------+---------------+---------+-----------+----------+--------------+ FV Prox  Full                                                        +---------+---------------+---------+-----------+----------+--------------+ FV Mid   Full                                                        +---------+---------------+---------+-----------+----------+--------------+ FV DistalFull                                                        +---------+---------------+---------+-----------+----------+--------------+ PFV      Full                                                        +---------+---------------+---------+-----------+----------+--------------+  POP      Full           Yes      Yes                                 +---------+---------------+---------+-----------+----------+--------------+ PTV      Full                                                        +---------+---------------+---------+-----------+----------+--------------+ PERO     Full                                                        +---------+---------------+---------+-----------+----------+--------------+     Summary: RIGHT: - There is no evidence of deep vein thrombosis in the lower extremity.  - No cystic structure found in the popliteal fossa.  LEFT: - There is no evidence of deep vein thrombosis in the lower extremity.  - No cystic structure found in the popliteal fossa.  *See table(s) above for measurements and observations. Electronically signed by Lemar Livings MD on 11/17/2022 at 11:04:10 AM.    Final         Scheduled Meds:  fluticasone furoate-vilanterol  1 puff Inhalation Daily   lidocaine  1 patch Transdermal Q24H   losartan  25 mg Oral Daily   metoprolol succinate   50 mg Oral Daily   nicotine  21 mg Transdermal Daily   sodium chloride flush  3 mL Intravenous Q12H   sodium chloride flush  3 mL Intravenous Q12H   umeclidinium bromide  1 puff Inhalation Daily   Continuous Infusions:  sodium chloride     heparin 1,600 Units/hr (11/19/22 0700)          Glade Lloyd, MD [facility] 11/19/2022, 8:09 AM

## 2022-11-19 NOTE — Progress Notes (Signed)
ANTICOAGULATION CONSULT NOTE - Follow Up Consult  Pharmacy Consult for Heparin Indication: pulmonary embolus  No Known Allergies  Patient Measurements: Height: 5\' 4"  (162.6 cm) Weight: 62 kg (136 lb 11.2 oz) IBW/kg (Calculated) : 54.7 Heparin Dosing Weight: 63.5kg  Vital Signs: Temp: 98.9 F (37.2 C) (06/15 0534) Temp Source: Oral (06/15 0534) BP: 136/66 (06/15 0909) Pulse Rate: 76 (06/15 0909)  Labs: Recent Labs    11/16/22 1238 11/16/22 2048 11/17/22 1610 11/17/22 1548 11/18/22 0357 11/18/22 1429 11/19/22 0017 11/19/22 0438 11/19/22 0830  HGB  --    < > 10.0*  --  10.8*  --   --  9.9*  --   HCT  --   --  30.4*  --  32.8*  --   --  29.8*  --   PLT  --   --  353  --  368  --   --  356  --   LABPROT  --   --   --   --  16.5*  --   --   --   --   INR  --   --   --   --  1.3*  --   --   --   --   HEPARINUNFRC  --    < > 0.31   < > 0.23* 0.25* 0.46  --  0.44  CREATININE  --   --   --   --  1.04*  --   --   --   --   TROPONINIHS 11  --   --   --   --   --   --   --   --    < > = values in this interval not displayed.     Estimated Creatinine Clearance: 42.8 mL/min (A) (by C-G formula based on SCr of 1.04 mg/dL (H)).   Assessment: Patient present to ED with left sides chest discomfort and abdominal pain. CTA shows segmental pulmonary emboli right upper lobe greater than left lower lobe. Small amount of clot burden. No history of oral anticoagulants.   11/19/2022 HL 0.44 therapeutic on 1600 units/hr Hgb 9.9, plt 356  No bleeding or line issues  noted per RN   Goal of Therapy:  Heparin level 0.3-0.7 units/ml Monitor platelets by anticoagulation protocol: Yes   Plan:  Continue heparin drip at 1600 units/hr Daily HL and CBC Follow plans for long term anticoagulation once invasive procedures done   Adalberto Cole, PharmD, BCPS 11/19/2022 9:51 AM

## 2022-11-19 NOTE — Progress Notes (Signed)
Rounding Note    Patient Name: Erin Pacheco Date of Encounter: 11/19/2022  [facility] Cardiologist: Rollene Rotunda, MD   Subjective   No cardiac symptoms discussed care with daughter at length  Inpatient Medications    Scheduled Meds:  fluticasone furoate-vilanterol  1 puff Inhalation Daily   lidocaine  1 patch Transdermal Q24H   losartan  25 mg Oral Daily   metoprolol succinate  50 mg Oral Daily   nicotine  21 mg Transdermal Daily   sodium chloride flush  3 mL Intravenous Q12H   sodium chloride flush  3 mL Intravenous Q12H   umeclidinium bromide  1 puff Inhalation Daily   Continuous Infusions:  sodium chloride     heparin 1,600 Units/hr (11/19/22 0700)   PRN Meds: sodium chloride, acetaminophen **OR** acetaminophen, albuterol, ALPRAZolam, bisacodyl, fentaNYL (SUBLIMAZE) injection, guaiFENesin, hydrALAZINE, ondansetron **OR** ondansetron (ZOFRAN) IV, oxyCODONE, polyethylene glycol, polyvinyl alcohol, sodium chloride flush, traZODone   Vital Signs    Vitals:   11/18/22 0829 11/18/22 2206 11/19/22 0534 11/19/22 0806  BP:  125/69 135/77   Pulse:  74 72   Resp:  18 20 18   Temp:  99.3 F (37.4 C) 98.9 F (37.2 C)   TempSrc:  Oral Oral   SpO2: (!) 1% 92% 96% 95%  Weight:   62 kg   Height:        Intake/Output Summary (Last 24 hours) at 11/19/2022 0843 Last data filed at 11/19/2022 0700 Gross per 24 hour  Intake 953.12 ml  Output 380 ml  Net 573.12 ml      11/19/2022    5:34 AM 11/16/2022    8:28 AM 04/09/2021    3:41 PM  Last 3 Weights  Weight (lbs) 136 lb 11.2 oz 140 lb 144 lb  Weight (kg) 62.007 kg 63.504 kg 65.318 kg      Telemetry    NSR with bundle branch block. - Personally Reviewed  ECG    NSR with LBBB - Personally Reviewed  Physical Exam   GEN: No acute distress.   Neck: No JVD Cardiac: RRR, no murmurs, rubs, or gallops.  Respiratory: Clear to auscultation bilaterally. GI: Soft, nontender, non-distended  MS: No edema; No  deformity. Neuro:  Nonfocal  Psych: Normal affect   Labs    High Sensitivity Troponin:   Recent Labs  Lab 11/16/22 0849 11/16/22 1238  TROPONINIHS 9 11     Chemistry Recent Labs  Lab 11/16/22 0849 11/18/22 0357  NA 137 135  K 3.9 3.7  CL 100 99  CO2 28 24  GLUCOSE 113* 97  BUN 23 16  CREATININE 1.26* 1.04*  CALCIUM 9.2 8.6*  MG  --  2.3  GFRNONAA 46* 57*  ANIONGAP 9 12    Lipids No results for input(s): "CHOL", "TRIG", "HDL", "LABVLDL", "LDLCALC", "CHOLHDL" in the last 168 hours.  Hematology Recent Labs  Lab 11/17/22 0621 11/18/22 0357 11/19/22 0438  WBC 11.4* 12.4* 11.8*  RBC 3.44* 3.71* 3.40*  HGB 10.0* 10.8* 9.9*  HCT 30.4* 32.8* 29.8*  MCV 88.4 88.4 87.6  MCH 29.1 29.1 29.1  MCHC 32.9 32.9 33.2  RDW 15.1 15.1 15.0  PLT 353 368 356   Thyroid No results for input(s): "TSH", "FREET4" in the last 168 hours.  BNPNo results for input(s): "BNP", "PROBNP" in the last 168 hours.  DDimer  Recent Labs  Lab 11/16/22 0849  DDIMER 3.14*     Radiology    NM Bone Scan Whole Body  Result  Date: 11/18/2022 CLINICAL DATA:  Lung cancer staging EXAM: NUCLEAR MEDICINE WHOLE BODY BONE SCAN TECHNIQUE: Whole body anterior and posterior images were obtained approximately 3 hours after intravenous injection of radiopharmaceutical. RADIOPHARMACEUTICALS:  22.0 mCi Technetium-70m MDP IV COMPARISON:  Chest CT 11/16/2022.  Abdomen pelvis CT 11/17/2022 FINDINGS: There is physiologic distribution radiotracer along the kidneys and bladder. Presumed injection site along the right antecubital region. Degenerative type areas of uptake seen along the left shoulder greater than right, midthoracic, lower lumbar spine and along the sacroiliac joint regions. Also degenerative type uptake along the ankles and feet. No additional areas of abnormal radiotracer uptake to suggest osseous metastatic disease. IMPRESSION: Multifocal degenerative type uptake. No scintigraphic evidence of osseous  metastatic disease. Electronically Signed   By: Karen Kays M.D.   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 Pulmonary emboli. Staging. EXAM: MRI HEAD WITHOUT AND WITH CONTRAST TECHNIQUE: Multiplanar, multiecho pulse sequences of the brain and surrounding structures were obtained without and with intravenous contrast. CONTRAST:  6mL GADAVIST GADOBUTROL 1 MMOL/ML IV SOLN COMPARISON:  Recent CT Chest, Abdomen, and Pelvis 11/16/2022 and 11/17/2022. Head CT 08/30/2017. FINDINGS: Brain: No restricted diffusion to suggest acute infarction. No midline shift, mass effect, evidence of mass lesion, ventriculomegaly, extra-axial collection or acute intracranial hemorrhage. Cervicomedullary junction and pituitary are within normal limits. No abnormal enhancement identified. No abnormal dural thickening or enhancement identified. Cerebral volume is within normal limits for age. Wallace Cullens and white matter signal is within normal limits for age throughout the brain. No cortical encephalomalacia or chronic cerebral blood products identified. Vascular: Major intracranial vascular flow voids are preserved. The major dural venous sinuses are enhancing and appear to be patent. Skull and upper cervical spine: Chronic cervical spine disc and endplate degeneration with degenerative appearing endplate marrow signal changes. Background bone marrow signal is within normal limits. No suspicious osseous lesion identified. Sinuses/Orbits: Postoperative changes to both globes, otherwise negative orbits. Trace paranasal sinus mucosal thickening. Other: Mastoids are clear. Visible internal auditory structures appear normal. Numerous small but benign appearing nasopharyngeal retention cysts, T2 hyperintense and nonenhancing following contrast. Negative visible scalp and face. IMPRESSION: No metastatic disease or acute  intracranial abnormality. Normal for age MRI appearance of the brain. Electronically Signed   By: Odessa Fleming M.D.   On: 11/18/2022 07:30   CT ABDOMEN PELVIS W WO CONTRAST  Result Date: 11/17/2022 CLINICAL DATA:  Spiculated 2.4 cm left upper lobe lung nodule on recent chest CT, which also partially included a right adrenal mass concerning for the possibility of lung cancer with a metastatic lesion. Today's exam is for further characterization of the right adrenal mass. EXAM: CT ABDOMEN AND PELVIS WITHOUT AND WITH CONTRAST TECHNIQUE: Multidetector CT imaging of the abdomen and pelvis was performed following the standard protocol before and following the bolus administration of intravenous contrast. RADIATION DOSE REDUCTION: This exam was performed according to the departmental dose-optimization program which includes automated exposure control, adjustment of the mA and/or kV according to patient size and/or use of iterative reconstruction technique. CONTRAST:  OMNIPAQUE IOHEXOL 300 MG/ML  SOLN COMPARISON:  Overlapping portions CT chest 11/16/2022 FINDINGS: Lower chest: Atelectasis noted along both lung bases, mildly increased in the right middle lobe compared to previous, and with some hazy non dependent peripheral opacity in the left lower lobe for example on image 12 series 4 similar to previous. Pneumonia is  not excluded. Mild cardiomegaly is present. Descending thoracic aortic atherosclerosis. Hepatobiliary: Unremarkable Pancreas: Unremarkable Spleen: Unremarkable Adrenals/Urinary Tract: The left adrenal gland appears normal. 5.2 by 3.1 by 4.3 cm right adrenal mass demonstrates precontrast internal density of 33 Hounsfield units and a absolute washout of -58% and relative washout of -25%, both indeterminate. The lesion demonstrates a higher degree of enhancement peripherally rather than centrally, potentially reflecting some degree of central necrosis. Morphologically the lesion is suspicious and concerning  for potential malignancy and there is some faint stranding along its inferior margin. A 1.2 cm simple cyst of the right kidney upper pole is present on image 49 series 2. No further imaging workup of this lesion is indicated. Mild scarring in the left kidney noted. Stomach/Bowel: Unremarkable Vascular/Lymphatic: Atherosclerosis is present, including aortoiliac atherosclerotic disease. There is substantial atheromatous calcification in the superior mesenteric artery, without overt occlusion. Mild atheromatous plaque proximally in the celiac trunk without occlusion. No pathologic adenopathy. Reproductive: Uterus absent.  Adnexa unremarkable. Other: No supplemental non-categorized findings. Musculoskeletal: Dextroconvex lumbar scoliosis with rotary component. Grade 1 degenerative anterolisthesis at L2-3 and L3-4 with substantial loss of disc height and endplate sclerosis at the L4-5 and L5-S1 levels. In conjunction with intervertebral and facet spurring as well as degenerative disc disease, there is substantial impingement at all levels between L2 and S1. Small bilateral groin hernias contain adipose tissue. IMPRESSION: 1. 5.2 cm right adrenal mass is suspicious for malignancy. Biopsy or other appropriate confirmation for example with PET-CT imaging would be suggested. 2. Aortic atherosclerosis. 3. Substantial impingement at all levels between L2 and S1 due to scoliosis, spondylosis, and degenerative disc disease. 4. Small bilateral groin hernias contain adipose tissue. 5. Mild cardiomegaly. 6. Atelectasis along both lung bases, mildly increased in the right middle lobe compared to previous, and with some hazy non dependent peripheral opacity in the left lower lobe similar to previous. Pneumonia is not excluded. Aortic Atherosclerosis (ICD10-I70.0). Electronically Signed   By: Gaylyn Rong M.D.   On: 11/17/2022 18:54   VAS [place] LOWER EXTREMITY VENOUS (DVT)  Result Date: 11/17/2022  Lower Venous DVT Study  Patient Name:  PEIGHTON GARTENBERG  Date of Exam:   11/17/2022 Medical Rec #: [id number]         Accession #:    [id number] Date of Birth: 1951/05/18         Patient Gender: F Patient Age:   63 years Exam Location:  [facility] Procedure:      VAS [place] LOWER EXTREMITY VENOUS (DVT) Referring Phys: COURAGE EMOKPAE --------------------------------------------------------------------------------  Indications: Swelling.  Risk Factors: Confirmed PE. Anticoagulation: Heparin. Limitations: Poor ultrasound/tissue interface. Comparison Study: No prior studies. Performing Technologist: Chanda Busing RVT  Examination Guidelines: A complete evaluation includes B-mode imaging, spectral Doppler, color Doppler, and power Doppler as needed of all accessible portions of each vessel. Bilateral testing is considered an integral part of a complete examination. Limited examinations for reoccurring indications may be performed as noted. The reflux portion of the exam is performed with the patient in reverse Trendelenburg.  +---------+---------------+---------+-----------+----------+--------------+ RIGHT    CompressibilityPhasicitySpontaneityPropertiesThrombus Aging +---------+---------------+---------+-----------+----------+--------------+ CFV      Full           Yes      Yes                                 +---------+---------------+---------+-----------+----------+--------------+ SFJ      Full                                                        +---------+---------------+---------+-----------+----------+--------------+  FV Prox  Full                                                        +---------+---------------+---------+-----------+----------+--------------+ FV Mid   Full                                                        +---------+---------------+---------+-----------+----------+--------------+ FV DistalFull                                                         +---------+---------------+---------+-----------+----------+--------------+ PFV      Full                                                        +---------+---------------+---------+-----------+----------+--------------+ POP      Full           Yes      Yes                                 +---------+---------------+---------+-----------+----------+--------------+ PTV      Full                                                        +---------+---------------+---------+-----------+----------+--------------+ PERO     Full                                                        +---------+---------------+---------+-----------+----------+--------------+   +---------+---------------+---------+-----------+----------+--------------+ LEFT     CompressibilityPhasicitySpontaneityPropertiesThrombus Aging +---------+---------------+---------+-----------+----------+--------------+ CFV      Full           Yes      Yes                                 +---------+---------------+---------+-----------+----------+--------------+ SFJ      Full                                                        +---------+---------------+---------+-----------+----------+--------------+ FV Prox  Full                                                        +---------+---------------+---------+-----------+----------+--------------+  FV Mid   Full                                                        +---------+---------------+---------+-----------+----------+--------------+ FV DistalFull                                                        +---------+---------------+---------+-----------+----------+--------------+ PFV      Full                                                        +---------+---------------+---------+-----------+----------+--------------+ POP      Full           Yes      Yes                                  +---------+---------------+---------+-----------+----------+--------------+ PTV      Full                                                        +---------+---------------+---------+-----------+----------+--------------+ PERO     Full                                                        +---------+---------------+---------+-----------+----------+--------------+     Summary: RIGHT: - There is no evidence of deep vein thrombosis in the lower extremity.  - No cystic structure found in the popliteal fossa.  LEFT: - There is no evidence of deep vein thrombosis in the lower extremity.  - No cystic structure found in the popliteal fossa.  *See table(s) above for measurements and observations. Electronically signed by Lemar Livings MD on 11/17/2022 at 11:04:10 AM.    Final     Cardiac Studies   Echo 11/16/2022  1. Left ventricular ejection fraction, by estimation, is 30 to 35%. The  left ventricle has moderately decreased function. The left ventricle  demonstrates global hypokinesis. Left ventricular diastolic parameters are  indeterminate.   2. Right ventricular systolic function is normal. The right ventricular  size is normal.   3. Left atrial size was mildly dilated.   4. A small pericardial effusion is present. The pericardial effusion is  circumferential.   5. The mitral valve is normal in structure. No evidence of mitral valve  regurgitation. No evidence of mitral stenosis.   6. The aortic valve was not well visualized. Aortic valve regurgitation  is mild to moderate.   Patient Profile     72 y.o. female with PMH of tobacco abuse and HTN presented with chest pain and persistent cough and found to have PE, LUL lung mass and  R adrenal mass on CTA of chest. Cardiology service consulted as her EF came back 30-35% on echo.   Assessment & Plan    New LV dysfunction: EF 30 to 35%.  Last echocardiogram in 2003 showed normal EF.  She is not grossly volume overloaded Currently on ARB and  Toprol Check BNP in am consider adding aldactone  Acute pulmonary embolism: On IV heparin.  Once all invasive study completed, then will consider PE dosing of Xarelto or Eliquis   Left upper lobe lung mass and right adrenal mass: Per primary team and pulmonology service. Oncology on board, planning additional imaging w possible biopsy CT guided biopsy delayed waiting on metanephrine levels to r/o Pheo before any biopsy     For questions or updates, please contact [facility] Please consult [email or web address] for contact info under        Signed, Charlton Haws, MD  11/19/2022, 8:43 AM

## 2022-11-20 DIAGNOSIS — R918 Other nonspecific abnormal finding of lung field: Secondary | ICD-10-CM | POA: Diagnosis not present

## 2022-11-20 DIAGNOSIS — E278 Other specified disorders of adrenal gland: Secondary | ICD-10-CM | POA: Diagnosis not present

## 2022-11-20 DIAGNOSIS — I420 Dilated cardiomyopathy: Secondary | ICD-10-CM | POA: Diagnosis not present

## 2022-11-20 DIAGNOSIS — I5020 Unspecified systolic (congestive) heart failure: Secondary | ICD-10-CM | POA: Diagnosis not present

## 2022-11-20 DIAGNOSIS — I2699 Other pulmonary embolism without acute cor pulmonale: Secondary | ICD-10-CM | POA: Diagnosis not present

## 2022-11-20 LAB — CBC
HCT: 29 % — ABNORMAL LOW (ref 36.0–46.0)
Hemoglobin: 9.4 g/dL — ABNORMAL LOW (ref 12.0–15.0)
MCH: 29 pg (ref 26.0–34.0)
MCHC: 32.4 g/dL (ref 30.0–36.0)
MCV: 89.5 fL (ref 80.0–100.0)
Platelets: 363 10*3/uL (ref 150–400)
RBC: 3.24 MIL/uL — ABNORMAL LOW (ref 3.87–5.11)
RDW: 15.3 % (ref 11.5–15.5)
WBC: 11.1 10*3/uL — ABNORMAL HIGH (ref 4.0–10.5)
nRBC: 0 % (ref 0.0–0.2)

## 2022-11-20 LAB — BRAIN NATRIURETIC PEPTIDE: B Natriuretic Peptide: 54.1 pg/mL (ref 0.0–100.0)

## 2022-11-20 LAB — HEPARIN LEVEL (UNFRACTIONATED): Heparin Unfractionated: 0.3 IU/mL (ref 0.30–0.70)

## 2022-11-20 MED ORDER — SPIRONOLACTONE 12.5 MG HALF TABLET
12.5000 mg | ORAL_TABLET | Freq: Every day | ORAL | Status: DC
  Administered 2022-11-20 – 2022-11-26 (×7): 12.5 mg via ORAL
  Filled 2022-11-20 (×7): qty 1

## 2022-11-20 NOTE — Progress Notes (Signed)
Rounding Note    Patient Name: Erin Pacheco Date of Encounter: 11/20/2022  [facility] Cardiologist: Rollene Rotunda, MD   Subjective   Patient and daughter sleeping did not wake  Inpatient Medications    Scheduled Meds:  fluticasone furoate-vilanterol  1 puff Inhalation Daily   lidocaine  1 patch Transdermal Q24H   losartan  25 mg Oral Daily   metoprolol succinate  50 mg Oral Daily   nicotine  21 mg Transdermal Daily   sodium chloride flush  3 mL Intravenous Q12H   sodium chloride flush  3 mL Intravenous Q12H   umeclidinium bromide  1 puff Inhalation Daily   Continuous Infusions:  sodium chloride     heparin 1,700 Units/hr (11/20/22 0600)   PRN Meds: sodium chloride, acetaminophen **OR** acetaminophen, albuterol, ALPRAZolam, bisacodyl, fentaNYL (SUBLIMAZE) injection, guaiFENesin, hydrALAZINE, ondansetron **OR** ondansetron (ZOFRAN) IV, oxyCODONE, polyethylene glycol, polyvinyl alcohol, sodium chloride flush, traZODone   Vital Signs    Vitals:   11/19/22 1243 11/19/22 2124 11/20/22 0444 11/20/22 0632  BP: 112/63 135/70 132/70   Pulse: 68 78 79   Resp: 18 19 18    Temp: 98.6 F (37 C) 98.5 F (36.9 C) 100.1 F (37.8 C) 99.8 F (37.7 C)  TempSrc: Oral Oral Oral Oral  SpO2: 96% 95% 97%   Weight:   63.3 kg   Height:        Intake/Output Summary (Last 24 hours) at 11/20/2022 0745 Last data filed at 11/20/2022 0600 Gross per 24 hour  Intake 1078.02 ml  Output 1000 ml  Net 78.02 ml      11/20/2022    4:44 AM 11/19/2022    5:34 AM 11/16/2022    8:28 AM  Last 3 Weights  Weight (lbs) 139 lb 8.8 oz 136 lb 11.2 oz 140 lb  Weight (kg) 63.3 kg 62.007 kg 63.504 kg      Telemetry    NSR with bundle branch block. - Personally Reviewed  ECG    NSR with LBBB - Personally Reviewed  Physical Exam   GEN: No acute distress.   Neck: No JVD Cardiac: RRR, no murmurs, rubs, or gallops.  Respiratory: Clear to auscultation bilaterally. GI: Soft,  nontender, non-distended  MS: No edema; No deformity. Neuro:  Nonfocal  Psych: Normal affect   Labs    High Sensitivity Troponin:   Recent Labs  Lab 11/16/22 0849 11/16/22 1238  TROPONINIHS 9 11     Chemistry Recent Labs  Lab 11/16/22 0849 11/18/22 0357  NA 137 135  K 3.9 3.7  CL 100 99  CO2 28 24  GLUCOSE 113* 97  BUN 23 16  CREATININE 1.26* 1.04*  CALCIUM 9.2 8.6*  MG  --  2.3  GFRNONAA 46* 57*  ANIONGAP 9 12    Lipids No results for input(s): "CHOL", "TRIG", "HDL", "LABVLDL", "LDLCALC", "CHOLHDL" in the last 168 hours.  Hematology Recent Labs  Lab 11/18/22 0357 11/19/22 0438 11/20/22 0413  WBC 12.4* 11.8* 11.1*  RBC 3.71* 3.40* 3.24*  HGB 10.8* 9.9* 9.4*  HCT 32.8* 29.8* 29.0*  MCV 88.4 87.6 89.5  MCH 29.1 29.1 29.0  MCHC 32.9 33.2 32.4  RDW 15.1 15.0 15.3  PLT 368 356 363   Thyroid No results for input(s): "TSH", "FREET4" in the last 168 hours.  BNPNo results for input(s): "BNP", "PROBNP" in the last 168 hours.  DDimer  Recent Labs  Lab 11/16/22 0849  DDIMER 3.14*     Radiology    NM Bone  Scan Whole Body  Result Date: 11/18/2022 CLINICAL DATA:  Lung cancer staging EXAM: NUCLEAR MEDICINE WHOLE BODY BONE SCAN TECHNIQUE: Whole body anterior and posterior images were obtained approximately 3 hours after intravenous injection of radiopharmaceutical. RADIOPHARMACEUTICALS:  22.0 mCi Technetium-57m MDP IV COMPARISON:  Chest CT 11/16/2022.  Abdomen pelvis CT 11/17/2022 FINDINGS: There is physiologic distribution radiotracer along the kidneys and bladder. Presumed injection site along the right antecubital region. Degenerative type areas of uptake seen along the left shoulder greater than right, midthoracic, lower lumbar spine and along the sacroiliac joint regions. Also degenerative type uptake along the ankles and feet. No additional areas of abnormal radiotracer uptake to suggest osseous metastatic disease. IMPRESSION: Multifocal degenerative type uptake. No  scintigraphic evidence of osseous metastatic disease. Electronically Signed   By: Karen Kays M.D.   On: 11/18/2022 15:21    Cardiac Studies   Echo 11/16/2022  1. Left ventricular ejection fraction, by estimation, is 30 to 35%. The  left ventricle has moderately decreased function. The left ventricle  demonstrates global hypokinesis. Left ventricular diastolic parameters are  indeterminate.   2. Right ventricular systolic function is normal. The right ventricular  size is normal.   3. Left atrial size was mildly dilated.   4. A small pericardial effusion is present. The pericardial effusion is  circumferential.   5. The mitral valve is normal in structure. No evidence of mitral valve  regurgitation. No evidence of mitral stenosis.   6. The aortic valve was not well visualized. Aortic valve regurgitation  is mild to moderate.   Patient Profile     72 y.o. female with PMH of tobacco abuse and HTN presented with chest pain and persistent cough and found to have PE, LUL lung mass and R adrenal mass on CTA of chest. Cardiology service consulted as her EF came back 30-35% on echo.   Assessment & Plan    New LV dysfunction: EF 30 to 35%.  Last echocardiogram in 2003 showed normal EF.  She is not grossly volume overloaded Currently on ARB and Toprol BNP pending I/O even weight up 3 lbs will add aldactone   Acute pulmonary embolism: On IV heparin.  Once all invasive study completed, then will consider PE dosing of Xarelto or Eliquis   Left upper lobe lung mass and right adrenal mass: Per primary team and pulmonology service. Oncology on board, planning additional imaging w possible biopsy CT guided biopsy delayed waiting on metanephrine levels to r/o Pheo before any biopsy     For questions or updates, please contact [facility] Please consult [email or web address] for contact info under        Signed, Charlton Haws, MD  11/20/2022, 7:45 AM

## 2022-11-20 NOTE — Progress Notes (Signed)
Mobility Specialist - Progress Note   11/20/22 1118  Mobility  Activity Ambulated with assistance in hallway  Level of Assistance Modified independent, requires aide device or extra time  Assistive Device Other (Comment) (IV Pole)  Distance Ambulated (ft) 350 ft  Activity Response Tolerated well  Mobility Referral Yes  $Mobility charge 1 Mobility  Mobility Specialist Start Time (ACUTE ONLY) 1102  Mobility Specialist Stop Time (ACUTE ONLY) 1116  Mobility Specialist Time Calculation (min) (ACUTE ONLY) 14 min   Pt received in bed and agreeable to mobility. No complaints during session. Pt to recliner after session with all needs met.    [facility]

## 2022-11-20 NOTE — Progress Notes (Signed)
PROGRESS NOTE    Erin Pacheco  OZH:[id number] DOB: 1950/10/27 DOA: 11/16/2022 PCP: Renaye Rakers, MD   Brief Narrative:  72 year old-year-old female with history of hypertension and tobacco abuse presented with abdominal pain along with cough and productive sputum.  On presentation, D-dimer was 3.14; CTA chest showed segmental pulmonary emboli right upper lobe greater than left upper lobe along with spiculated left upper lobe mass measuring 2.5 cm and right adrenal mass.  Echo showed EF of 30 to 35% with global hypokinesis.  She was started on heparin drip and transferred to [facility] as per patient/family request.  Pulmonary/IR/cardiology/oncology consulted.  Assessment & Plan:   Acute pulmonary embolism -Imaging as above.  Currently on heparin drip. -Lower extremity duplex ultrasound was negative for DVT. -Will transition to DOAC once cleared by oncology -Currently on room air. 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   -PCCM recommended IR consult for possible adrenal biopsy.  IR consulted: Recommended ruling out pheochromocytoma before proceeding with adrenal biopsy.  Plasma metanephrines and 24-hour urine metanephrines ordered: Results pending.   -Oncology following: Will follow-up with oncology regarding inpatient versus outpatient continuation of workup.  MRI of brain did not show any metastatic disease.  Nuclear medicine whole-body scan was negative for osseous metastatic disease.  Possible acute systolic heart failure -Echo showed EF of 30 to 35%.  New diagnosis.  Cardiology following.  Currently on metoprolol succinate and losartan.  Spironolactone added today by cardiology.  Strict input and output.  Daily weights.  Fluid restriction.  AKI versus CKD -Creatinine 1.26 on presentation.  No recent renal function available.  Creatinine 1.04 on 11/18/2022.  Possible COPD Tobacco  abuse -Pulmonary following: Will follow-up on 11/21/2022 if still inpatient.  Continue current inhaled regimen.  Will need outpatient PFTs -Patient was counseled regarding tobacco cessation by admitting hospitalist -Continue nicotine patch  Hypertension -Monitor blood pressure.  Continue metoprolol, spironolactone and losartan as per cardiology.  Amlodipine has been discontinued.    Normocytic anemia -Possibly anemia of chronic disease.  Hemoglobin stable.  No signs of bleeding.  Leukocytosis -Possibly reactive.  Improving  DVT prophylaxis: Heparin drip Code Status: Full Family Communication: Daughter at bedside  Disposition Plan: Status is: Inpatient Remains inpatient appropriate because: Of severity of illness  Consultants: Pulmonary/IR/cardiology/oncology  Procedures: Echo  Antimicrobials: None   Subjective: Patient seen and examined at bedside.  Still complains of intermittent shortness of breath and cough.  No vomiting or abdominal pain reported. Objective: Vitals:   11/19/22 1243 11/19/22 2124 11/20/22 0444 11/20/22 0632  BP: 112/63 135/70 132/70   Pulse: 68 78 79   Resp: 18 19 18    Temp: 98.6 F (37 C) 98.5 F (36.9 C) 100.1 F (37.8 C) 99.8 F (37.7 C)  TempSrc: Oral Oral Oral Oral  SpO2: 96% 95% 97%   Weight:   63.3 kg   Height:        Intake/Output Summary (Last 24 hours) at 11/20/2022 0804 Last data filed at 11/20/2022 0600 Gross per 24 hour  Intake 1078.02 ml  Output 1000 ml  Net 78.02 ml    Filed Weights   11/16/22 0828 11/19/22 0534 11/20/22 0444  Weight: 63.5 kg 62 kg 63.3 kg    Examination:  General: Currently on room air.  No distress.  Chronically ill and deconditioned looking. ENT/neck: No obvious palpable neck masses or JVD elevation noted  respiratory: Decreased breath sounds  at bases bilaterally, no wheezing  CVS: S1-S2 heard; rate currently controlled abdominal: Soft, nontender, still slightly distended; no organomegaly, normal  bowel sounds are heard  extremities: Bilateral lower extremity edema present; no cyanosis  CNS: Awake and.  Still slow to respond.  No obvious focal neurologic deficits noted lymph: No cervical lymphadenopathy Skin: No obvious ecchymosis/lesions psych: Mostly flat affect.  Showing no signs of agitation currently Musculoskeletal: No obvious joint swelling/deformity   Data Reviewed: I have personally reviewed following labs and imaging studies  CBC: Recent Labs  Lab 11/16/22 0849 11/17/22 0621 11/18/22 0357 11/19/22 0438 11/20/22 0413  WBC 11.2* 11.4* 12.4* 11.8* 11.1*  HGB 10.7* 10.0* 10.8* 9.9* 9.4*  HCT 33.0* 30.4* 32.8* 29.8* 29.0*  MCV 89.7 88.4 88.4 87.6 89.5  PLT 373 353 368 356 363    Basic Metabolic Panel: Recent Labs  Lab 11/16/22 0849 11/18/22 0357  NA 137 135  K 3.9 3.7  CL 100 99  CO2 28 24  GLUCOSE 113* 97  BUN 23 16  CREATININE 1.26* 1.04*  CALCIUM 9.2 8.6*  MG  --  2.3    GFR: Estimated Creatinine Clearance: 42.8 mL/min (A) (by C-G formula based on SCr of 1.04 mg/dL (H)). Liver Function Tests: No results for input(s): "AST", "ALT", "ALKPHOS", "BILITOT", "PROT", "ALBUMIN" in the last 168 hours. No results for input(s): "LIPASE", "AMYLASE" in the last 168 hours. No results for input(s): "AMMONIA" in the last 168 hours. Coagulation Profile: Recent Labs  Lab 11/18/22 0357  INR 1.3*    Cardiac Enzymes: No results for input(s): "CKTOTAL", "CKMB", "CKMBINDEX", "TROPONINI" in the last 168 hours. BNP (last 3 results) No results for input(s): "PROBNP" in the last 8760 hours. HbA1C: No results for input(s): "HGBA1C" in the last 72 hours. CBG: No results for input(s): "GLUCAP" in the last 168 hours. Lipid Profile: No results for input(s): "CHOL", "HDL", "LDLCALC", "TRIG", "CHOLHDL", "LDLDIRECT" in the last 72 hours. Thyroid Function Tests: No results for input(s): "TSH", "T4TOTAL", "FREET4", "T3FREE", "THYROIDAB" in the last 72 hours. Anemia  Panel: No results for input(s): "VITAMINB12", "FOLATE", "FERRITIN", "TIBC", "IRON", "RETICCTPCT" in the last 72 hours.  Sepsis Labs: No results for input(s): "PROCALCITON", "LATICACIDVEN" in the last 168 hours.  No results found for this or any previous visit (from the past 240 hour(s)).       Radiology Studies: NM Bone Scan Whole Body  Result Date: 11/18/2022 CLINICAL DATA:  Lung cancer staging EXAM: NUCLEAR MEDICINE WHOLE BODY BONE SCAN TECHNIQUE: Whole body anterior and posterior images were obtained approximately 3 hours after intravenous injection of radiopharmaceutical. RADIOPHARMACEUTICALS:  22.0 mCi Technetium-38m MDP IV COMPARISON:  Chest CT 11/16/2022.  Abdomen pelvis CT 11/17/2022 FINDINGS: There is physiologic distribution radiotracer along the kidneys and bladder. Presumed injection site along the right antecubital region. Degenerative type areas of uptake seen along the left shoulder greater than right, midthoracic, lower lumbar spine and along the sacroiliac joint regions. Also degenerative type uptake along the ankles and feet. No additional areas of abnormal radiotracer uptake to suggest osseous metastatic disease. IMPRESSION: Multifocal degenerative type uptake. No scintigraphic evidence of osseous metastatic disease. Electronically Signed   By: Karen Kays M.D.   On: 11/18/2022 15:21        Scheduled Meds:  fluticasone furoate-vilanterol  1 puff Inhalation Daily   lidocaine  1 patch Transdermal Q24H   losartan  25 mg Oral Daily   metoprolol succinate  50 mg Oral Daily   nicotine  21 mg Transdermal Daily  sodium chloride flush  3 mL Intravenous Q12H   sodium chloride flush  3 mL Intravenous Q12H   spironolactone  12.5 mg Oral Daily   umeclidinium bromide  1 puff Inhalation Daily   Continuous Infusions:  sodium chloride     heparin 1,700 Units/hr (11/20/22 0600)          Glade Lloyd, MD [facility] 11/20/2022, 8:04 AM

## 2022-11-20 NOTE — Progress Notes (Addendum)
ANTICOAGULATION CONSULT NOTE - Follow Up Consult  Pharmacy Consult for Heparin Indication: pulmonary embolus  No Known Allergies  Patient Measurements: Height: 5\' 4"  (162.6 cm) Weight: 62 kg (136 lb 11.2 oz) IBW/kg (Calculated) : 54.7 Heparin Dosing Weight: 63.5kg  Vital Signs: Temp: 100.1 F (37.8 C) (06/16 0444) Temp Source: Oral (06/16 0444) BP: 132/70 (06/16 0444) Pulse Rate: 79 (06/16 0444)  Labs: Recent Labs    11/18/22 0357 11/18/22 1429 11/19/22 0017 11/19/22 0438 11/19/22 0830 11/20/22 0413  HGB 10.8*  --   --  9.9*  --  9.4*  HCT 32.8*  --   --  29.8*  --  29.0*  PLT 368  --   --  356  --  363  LABPROT 16.5*  --   --   --   --   --   INR 1.3*  --   --   --   --   --   HEPARINUNFRC 0.23*   < > 0.46  --  0.44 0.30  CREATININE 1.04*  --   --   --   --   --    < > = values in this interval not displayed.     Estimated Creatinine Clearance: 42.8 mL/min (A) (by C-G formula based on SCr of 1.04 mg/dL (H)).   Assessment: Patient present to ED with left sides chest discomfort and abdominal pain. CTA shows segmental pulmonary emboli right upper lobe greater than left lower lobe. Small amount of clot burden. No history of oral anticoagulants.   11/20/2022 HL 0.3 low end of therapeutic on 1600 units/hr Hgb 9.4, plt 363 No bleeding or line issues noted per RN  Goal of Therapy:  Heparin level 0.3-0.7 units/ml Monitor platelets by anticoagulation protocol: Yes   Plan:  Increase heparin to 1700 units/hr to keep from falling below goal Daily HL and CBC Follow plans for long term anticoagulation once invasive procedures done   Arley Phenix RPh 11/20/2022, 5:07 AM

## 2022-11-21 DIAGNOSIS — D500 Iron deficiency anemia secondary to blood loss (chronic): Secondary | ICD-10-CM

## 2022-11-21 DIAGNOSIS — E278 Other specified disorders of adrenal gland: Secondary | ICD-10-CM | POA: Diagnosis not present

## 2022-11-21 DIAGNOSIS — E611 Iron deficiency: Secondary | ICD-10-CM | POA: Diagnosis not present

## 2022-11-21 DIAGNOSIS — I2699 Other pulmonary embolism without acute cor pulmonale: Secondary | ICD-10-CM | POA: Diagnosis not present

## 2022-11-21 DIAGNOSIS — I5020 Unspecified systolic (congestive) heart failure: Secondary | ICD-10-CM | POA: Diagnosis not present

## 2022-11-21 DIAGNOSIS — R918 Other nonspecific abnormal finding of lung field: Secondary | ICD-10-CM | POA: Diagnosis not present

## 2022-11-21 DIAGNOSIS — I420 Dilated cardiomyopathy: Secondary | ICD-10-CM | POA: Diagnosis not present

## 2022-11-21 LAB — MAGNESIUM: Magnesium: 2.1 mg/dL (ref 1.7–2.4)

## 2022-11-21 LAB — BASIC METABOLIC PANEL
Anion gap: 10 (ref 5–15)
BUN: 12 mg/dL (ref 8–23)
CO2: 22 mmol/L (ref 22–32)
Calcium: 8.6 mg/dL — ABNORMAL LOW (ref 8.9–10.3)
Chloride: 104 mmol/L (ref 98–111)
Creatinine, Ser: 0.91 mg/dL (ref 0.44–1.00)
GFR, Estimated: >60 mL/min (ref >60)
Glucose, Bld: 103 mg/dL — ABNORMAL HIGH (ref 70–99)
Potassium: 4.1 mmol/L (ref 3.5–5.1)
Sodium: 136 mmol/L (ref 135–145)

## 2022-11-21 LAB — CBC
HCT: 31.3 % — ABNORMAL LOW (ref 36.0–46.0)
Hemoglobin: 9.9 g/dL — ABNORMAL LOW (ref 12.0–15.0)
MCH: 28.5 pg (ref 26.0–34.0)
MCHC: 31.6 g/dL (ref 30.0–36.0)
MCV: 90.2 fL (ref 80.0–100.0)
Platelets: 380 10*3/uL (ref 150–400)
RBC: 3.47 MIL/uL — ABNORMAL LOW (ref 3.87–5.11)
RDW: 15.3 % (ref 11.5–15.5)
WBC: 11.5 10*3/uL — ABNORMAL HIGH (ref 4.0–10.5)
nRBC: 0 % (ref 0.0–0.2)

## 2022-11-21 LAB — METANEPHRINES, PLASMA
Metanephrine, Free: 52.9 pg/mL (ref 0.0–88.0)
Normetanephrine, Free: 149.4 pg/mL (ref 0.0–285.2)

## 2022-11-21 LAB — HEPARIN LEVEL (UNFRACTIONATED): Heparin Unfractionated: 0.63 IU/mL (ref 0.30–0.70)

## 2022-11-21 MED ORDER — SODIUM CHLORIDE 0.9 % IV SOLN
510.0000 mg | Freq: Once | INTRAVENOUS | Status: AC
  Administered 2022-11-21: 510 mg via INTRAVENOUS
  Filled 2022-11-21: qty 17

## 2022-11-21 NOTE — Progress Notes (Addendum)
ANTICOAGULATION CONSULT NOTE - Follow Up Consult  Pharmacy Consult for Heparin Indication: pulmonary embolus  No Known Allergies  Patient Measurements: Height: 5\' 4"  (162.6 cm) Weight: 62.3 kg (137 lb 5.6 oz) IBW/kg (Calculated) : 54.7 Heparin Dosing Weight: 63.5kg  Vital Signs: Temp: 100 F (37.8 C) (06/17 0448) Temp Source: Oral (06/17 0448) BP: 144/77 (06/17 0448) Pulse Rate: 72 (06/17 0448)  Labs: Recent Labs    11/19/22 0438 11/19/22 0830 11/20/22 0413 11/21/22 0347 11/21/22 0512  HGB 9.9*  --  9.4* 9.9*  --   HCT 29.8*  --  29.0* 31.3*  --   PLT 356  --  363 380  --   HEPARINUNFRC  --  0.44 0.30  --  0.63  CREATININE  --   --   --  0.91  --      Estimated Creatinine Clearance: 49 mL/min (by C-G formula based on SCr of 0.91 mg/dL).   Assessment: Patient present to ED with left sides chest discomfort and abdominal pain. CTA shows segmental pulmonary emboli right upper lobe greater than left lower lobe. Small amount of clot burden. No history of oral anticoagulants.   Today, 11/21/2022 HL 0.63, remains therapeutic on IV heparin at 1700 units/hr Hgb 9.9 - low, but stable Plt 380 No bleeding or line issues noted per RN  Goal of Therapy:  Heparin level 0.3-0.7 units/ml Monitor platelets by anticoagulation protocol: Yes   Plan:  Continue heparin at 1700 units/hr Daily HL and CBC Continue to monitor H&H and platelets Monitor for signs/symptoms of bleeding Follow plans for long term anticoagulation once invasive procedures done   Tacy Learn, PharmD Clinical Pharmacist 11/21/2022 8:51 AM

## 2022-11-21 NOTE — Progress Notes (Signed)
NAME:  Erin Pacheco, MRN:  [id number], DOB:  1951/05/20, LOS: 5 ADMISSION DATE:  11/16/2022, CONSULTATION DATE:  11/17/2022 REFERRING MD:  Dr. Pauletta Browns, [facility], CHIEF COMPLAINT:  cough   History of Present Illness:  72 yo female smoker presented to [facility] with productive cough.  She had elevated D dimer.  CT angio chest showed multiple areas of acute pulmonary emboli, centrilobular emphysema more in upper lungs, a spiculated mass 2.5 x 2.1 cm in LUL, and 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  She was started on heparin gtt and transferred to [facility] for further management.  Pertinent  Medical History  HTN, Arthritis  Significant Hospital Events: Including procedures, antibiotic start and stop dates in addition to other pertinent events   6/12 present to [facility], start heparin gtt, transfer to [facility]  Interim History / Subjective:  NAEON. Remains on heparin.  Pheochromocytoma workup results not back yet.  Objective   Blood pressure (!) 144/77, pulse 72, temperature 100 F (37.8 C), temperature source Oral, resp. rate 18, height 5\' 4"  (1.626 m), weight 62.3 kg, SpO2 98 %.        Intake/Output Summary (Last 24 hours) at 11/21/2022 1116 Last data filed at 11/21/2022 0700 Gross per 24 hour  Intake 764.6 ml  Output 225 ml  Net 539.6 ml    Filed Weights   11/19/22 0534 11/20/22 0444 11/21/22 0613  Weight: 62 kg 63.3 kg 62.3 kg    Examination:  General - alert Eyes - pupils reactive ENT - no sinus tenderness, no stridor Cardiac - regular rate/rhythm, no murmur Chest - equal breath sounds b/l, no wheezing or rales Abdomen - soft, non tender, + bowel sounds Extremities - no cyanosis, clubbing, or edema Skin - no rashes Neuro - normal strength, moves extremities, follows commands Psych - normal mood and behavior Lymphatics - no lymphadenopathy  Assessment & Plan:   Lt upper lobe 2.5 x 2.1 cm spiculated mass with 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 - main concern is for primary lung cancer with  metastatic lesion to adrenal gland versus two separate processes -Recommend biopsy of adrenal mass first and if nondiagnostic or noncancerous would need navigational bronchoscopy to the left upper lobe mass as an outpatient via navigational bronchoscopy, her cardiomyopathy certainly would complicate her candidacy for this and she may not be deemed a good candidate  Rt adrenal mass. -Recommend IR biopsy as if consistent with lung cancer would provide staging and diagnosis  Acute pulmonary embolism, low risk, provoked in the setting of presumed malignancy.  Right ventricle size, right atrial size normal, RV function normal. -Continue heparin GTT after biopsy, recommend transition to Eliquis thereafter  Acute systolic CHF. - Echo from 11/16/22 shows EF 30 to 35%, global hypokinesis, mild LA dilation, small pericardial effusion, mild/mod AR - cardiology consulted  Centrilobular emphysema. - incruse one puff daily, breo 100 one puff daily - prn albuterol - will need PFT set up as an outpt  Tobacco abuse. - continue nicotine patch  Please contact PCCM if adrenal biopsy is non-diagnostic or if results are not back prior to discharge and we can arrange outpatient work up of lung mass  Labs       Latest Ref Rng & Units 11/21/2022    3:47 AM 11/18/2022    3:57 AM 11/16/2022    8:49 AM  CMP  Glucose 70 - 99 mg/dL 409  97  811   BUN 8 - 23 mg/dL 12  16  23  Creatinine 0.44 - 1.00 mg/dL 1.61  0.96  0.45   Sodium 135 - 145 mmol/L 136  135  137   Potassium 3.5 - 5.1 mmol/L 4.1  3.7  3.9   Chloride 98 - 111 mmol/L 104  99  100   CO2 22 - 32 mmol/L 22  24  28    Calcium 8.9 - 10.3 mg/dL 8.6  8.6  9.2        Latest Ref Rng & Units 11/21/2022    3:47 AM 11/20/2022    4:13 AM 11/19/2022    4:38 AM  CBC  WBC 4.0 - 10.5 K/uL 11.5  11.1  11.8   Hemoglobin 12.0 - 15.0 g/dL 9.9  9.4  9.9   Hematocrit 36.0 - 46.0 % 31.3  29.0  29.8   Platelets 150 - 400 K/uL 380  363  356     Signature:  Karren Burly, MD [facility] Pulmonary/Critical Care See Amion or [phone] 11/21/2022, 11:16 AM

## 2022-11-21 NOTE — Progress Notes (Signed)
Mobility Specialist - Progress Note   11/21/22 1228  Mobility  Activity Ambulated independently in hallway  Level of Assistance Modified independent, requires aide device or extra time  Assistive Device Other (Comment) (IV Pole)  Distance Ambulated (ft) 350 ft  Range of Motion/Exercises Active  Activity Response Tolerated well  Mobility Referral Yes  $Mobility charge 1 Mobility  Mobility Specialist Start Time (ACUTE ONLY) 1218  Mobility Specialist Stop Time (ACUTE ONLY) 1228  Mobility Specialist Time Calculation (min) (ACUTE ONLY) 10 min   Pt was found on recliner chair and agreeable to ambulate. Had no complaints during session and at EOS returned to bed with all needs met. Call bell in reach.  Billey Chang Mobility Specialist

## 2022-11-21 NOTE — Progress Notes (Signed)
Rounding Note    Patient Name: Erin Pacheco Date of Encounter: 11/21/2022  [facility] Cardiologist: Rollene Rotunda, MD   Subjective   Up in chair no complaints  Inpatient Medications    Scheduled Meds:  fluticasone furoate-vilanterol  1 puff Inhalation Daily   lidocaine  1 patch Transdermal Q24H   losartan  25 mg Oral Daily   metoprolol succinate  50 mg Oral Daily   nicotine  21 mg Transdermal Daily   sodium chloride flush  3 mL Intravenous Q12H   sodium chloride flush  3 mL Intravenous Q12H   spironolactone  12.5 mg Oral Daily   umeclidinium bromide  1 puff Inhalation Daily   Continuous Infusions:  sodium chloride     heparin 1,700 Units/hr (11/21/22 0100)   PRN Meds: sodium chloride, acetaminophen **OR** acetaminophen, albuterol, ALPRAZolam, bisacodyl, fentaNYL (SUBLIMAZE) injection, guaiFENesin, hydrALAZINE, ondansetron **OR** ondansetron (ZOFRAN) IV, oxyCODONE, polyethylene glycol, polyvinyl alcohol, sodium chloride flush, traZODone   Vital Signs    Vitals:   11/20/22 2016 11/21/22 0448 11/21/22 0613 11/21/22 0748  BP: (!) 140/65 (!) 144/77    Pulse: 73 72    Resp: 18 18    Temp: 99.4 F (37.4 C) 100 F (37.8 C)    TempSrc: Oral Oral    SpO2: 92% 99%  98%  Weight:   62.3 kg   Height:        Intake/Output Summary (Last 24 hours) at 11/21/2022 1004 Last data filed at 11/21/2022 0700 Gross per 24 hour  Intake 764.6 ml  Output 475 ml  Net 289.6 ml      11/21/2022    6:13 AM 11/20/2022    4:44 AM 11/19/2022    5:34 AM  Last 3 Weights  Weight (lbs) 137 lb 5.6 oz 139 lb 8.8 oz 136 lb 11.2 oz  Weight (kg) 62.3 kg 63.3 kg 62.007 kg      Telemetry    SR LBBB - Personally Reviewed  ECG    No new - Personally Reviewed  Physical Exam   GEN: No acute distress.   Neck: No JVD Cardiac: RRR, no murmurs, rubs, or gallops.  Respiratory: Clear to auscultation bilaterally. GI: Soft, nontender, non-distended  MS: No edema; No  deformity. Neuro:  Nonfocal  Psych: Normal affect   Labs    High Sensitivity Troponin:   Recent Labs  Lab 11/16/22 0849 11/16/22 1238  TROPONINIHS 9 11     Chemistry Recent Labs  Lab 11/16/22 0849 11/18/22 0357 11/21/22 0347  NA 137 135 136  K 3.9 3.7 4.1  CL 100 99 104  CO2 28 24 22   GLUCOSE 113* 97 103*  BUN 23 16 12   CREATININE 1.26* 1.04* 0.91  CALCIUM 9.2 8.6* 8.6*  MG  --  2.3 2.1  GFRNONAA 46* 57* >60  ANIONGAP 9 12 10     Lipids No results for input(s): "CHOL", "TRIG", "HDL", "LABVLDL", "LDLCALC", "CHOLHDL" in the last 168 hours.  Hematology Recent Labs  Lab 11/19/22 0438 11/20/22 0413 11/21/22 0347  WBC 11.8* 11.1* 11.5*  RBC 3.40* 3.24* 3.47*  HGB 9.9* 9.4* 9.9*  HCT 29.8* 29.0* 31.3*  MCV 87.6 89.5 90.2  MCH 29.1 29.0 28.5  MCHC 33.2 32.4 31.6  RDW 15.0 15.3 15.3  PLT 356 363 380   Thyroid No results for input(s): "TSH", "FREET4" in the last 168 hours.  BNP Recent Labs  Lab 11/20/22 0413  BNP 54.1    DDimer  Recent Labs  Lab 11/16/22  0981  DDIMER 3.14*     Radiology    No results found.  Cardiac Studies   Echo 11/16/2022  1. Left ventricular ejection fraction, by estimation, is 30 to 35%. The  left ventricle has moderately decreased function. The left ventricle  demonstrates global hypokinesis. Left ventricular diastolic parameters are  indeterminate.   2. Right ventricular systolic function is normal. The right ventricular  size is normal.   3. Left atrial size was mildly dilated.   4. A small pericardial effusion is present. The pericardial effusion is  circumferential.   5. The mitral valve is normal in structure. No evidence of mitral valve  regurgitation. No evidence of mitral stenosis.   6. The aortic valve was not well visualized. Aortic valve regurgitation  is mild to moderate.   Patient Profile     72 y.o. female PMH of tobacco abuse and HTN presented with chest pain and persistent cough and found to have PE, LUL  lung mass and R adrenal mass on CTA of chest now with decreased EF 30-35%.    Assessment & Plan    New LV dysfunction: EF 30 to 35%.  Last echocardiogram in 2003 showed normal EF.  She is not grossly volume overloaded Currently on ARB and Toprol BNP is 54.1   I/O is + 895 and wt 62.3 kg down 1 Kg from yesterday --aldactone added at 12.5    BP stable at 144/77   Acute pulmonary embolism: On IV heparin.  Once all invasive study completed, then will consider PE dosing of Xarelto or Eliquis   Left upper lobe lung mass and right adrenal mass: Per primary team and pulmonology service. Oncology on board, planning additional imaging w possible biopsy CT guided biopsy delayed waiting on metanephrine levels to r/o Pheo before any biopsy  MRI brain clear.   Low grade fever 100  4.  Tobacco use.  Possible COPD per IM  5. Anemia with Hgb 9.9 to receive IV iron today     For questions or updates, please contact [facility] Please consult [email or web address] for contact info under        Signed, Nada Boozer, NP  11/21/2022, 10:04 AM

## 2022-11-21 NOTE — Progress Notes (Signed)
PROGRESS NOTE    Erin Pacheco  ZOX:[id number] DOB: 05-25-1951 DOA: 11/16/2022 PCP: Renaye Rakers, MD   Brief Narrative:  72 year old-year-old female with history of hypertension and tobacco abuse presented with abdominal pain along with cough and productive sputum.  On presentation, D-dimer was 3.14; CTA chest showed segmental pulmonary emboli right upper lobe greater than left upper lobe along with spiculated left upper lobe mass measuring 2.5 cm and right adrenal mass.  Echo showed EF of 30 to 35% with global hypokinesis.  She was started on heparin drip and transferred to [facility] as per patient/family request.  Pulmonary/IR/cardiology/oncology consulted.  Assessment & Plan:   Acute pulmonary embolism -Imaging as above.  Currently on heparin drip. -Lower extremity duplex ultrasound was negative for DVT. -Will transition to DOAC once cleared by oncology -Currently on room air. 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   -PCCM recommended IR consult for possible adrenal biopsy.  IR consulted: Recommended ruling out pheochromocytoma before proceeding with adrenal biopsy.  Plasma metanephrines and 24-hour urine metanephrines ordered: Results pending.   -Oncology following: Recommending to complete biopsy inpatient to have tissue diagnosis.  MRI of brain did not show any metastatic disease.  Nuclear medicine whole-body scan was negative for osseous metastatic disease.  Possible acute systolic heart failure -Echo showed EF of 30 to 35%.  New diagnosis.  Cardiology following.  Currently on metoprolol succinate, spironolactone and losartan. Strict input and output.  Daily weights.  Fluid restriction.  AKI versus CKD -Creatinine 1.26 on presentation.  No recent renal function available.  Creatinine 0.91 today.  Possible COPD Tobacco abuse -Pulmonary following intermittently.  Continue  current inhaled regimen.  Will need outpatient PFTs -Patient was counseled regarding tobacco cessation by admitting hospitalist -Continue nicotine patch  Hypertension -Monitor blood pressure.  Continue metoprolol, spironolactone and losartan as per cardiology.  Amlodipine has been discontinued.    Normocytic anemia -Possibly anemia of chronic disease.  Hemoglobin stable.  No signs of bleeding.  Leukocytosis -Possibly reactive.    DVT prophylaxis: Heparin drip Code Status: Full Family Communication: Daughter at bedside on 11/20/2022 Disposition Plan: Status is: Inpatient Remains inpatient appropriate because: Of severity of illness  Consultants: Pulmonary/IR/cardiology/oncology  Procedures: Echo  Antimicrobials: None   Subjective: Patient seen and examined at bedside.  Having low-grade fevers.  No chest pain, worsening shortness of breath, abdominal pain or vomiting reported. Objective: Vitals:   11/20/22 0905 11/20/22 2016 11/21/22 0448 11/21/22 0613  BP:  (!) 140/65 (!) 144/77   Pulse:  73 72   Resp:  18 18   Temp:  99.4 F (37.4 C) 100 F (37.8 C)   TempSrc:  Oral Oral   SpO2: 94% 92% 99%   Weight:    62.3 kg  Height:        Intake/Output Summary (Last 24 hours) at 11/21/2022 0742 Last data filed at 11/21/2022 0700 Gross per 24 hour  Intake 764.6 ml  Output 475 ml  Net 289.6 ml    Filed Weights   11/19/22 0534 11/20/22 0444 11/21/22 0613  Weight: 62 kg 63.3 kg 62.3 kg    Examination:  General: No acute distress.  Still on room air.  Chronically ill and deconditioned looking. ENT/neck: No obvious JVD elevation or palpable thyromegaly noted respiratory: Bilateral decreased breath sounds at bases bilaterally with scattered crackles  CVS: S1-S2 heard; mostly rate controlled abdominal: Soft, nontender, has some  distention; no organomegaly, bowel sounds are heard normally  extremities: No clubbing; mild lower extremity edema present bilaterally CNS: Alert and  awake.  Still slow to respond.  No focal neurologic deficits noted  lymph: No cervical lymphadenopathy palpable Skin: No obvious petechiae/rashes psych: Not agitated currently.  Affect is mostly flat  musculoskeletal: No obvious joint erythema/tenderness   Data Reviewed: I have personally reviewed following labs and imaging studies  CBC: Recent Labs  Lab 11/17/22 0621 11/18/22 0357 11/19/22 0438 11/20/22 0413 11/21/22 0347  WBC 11.4* 12.4* 11.8* 11.1* 11.5*  HGB 10.0* 10.8* 9.9* 9.4* 9.9*  HCT 30.4* 32.8* 29.8* 29.0* 31.3*  MCV 88.4 88.4 87.6 89.5 90.2  PLT 353 368 356 363 380    Basic Metabolic Panel: Recent Labs  Lab 11/16/22 0849 11/18/22 0357 11/21/22 0347  NA 137 135 136  K 3.9 3.7 4.1  CL 100 99 104  CO2 28 24 22   GLUCOSE 113* 97 103*  BUN 23 16 12   CREATININE 1.26* 1.04* 0.91  CALCIUM 9.2 8.6* 8.6*  MG  --  2.3 2.1    GFR: Estimated Creatinine Clearance: 49 mL/min (by C-G formula based on SCr of 0.91 mg/dL). Liver Function Tests: No results for input(s): "AST", "ALT", "ALKPHOS", "BILITOT", "PROT", "ALBUMIN" in the last 168 hours. No results for input(s): "LIPASE", "AMYLASE" in the last 168 hours. No results for input(s): "AMMONIA" in the last 168 hours. Coagulation Profile: Recent Labs  Lab 11/18/22 0357  INR 1.3*    Cardiac Enzymes: No results for input(s): "CKTOTAL", "CKMB", "CKMBINDEX", "TROPONINI" in the last 168 hours. BNP (last 3 results) No results for input(s): "PROBNP" in the last 8760 hours. HbA1C: No results for input(s): "HGBA1C" in the last 72 hours. CBG: No results for input(s): "GLUCAP" in the last 168 hours. Lipid Profile: No results for input(s): "CHOL", "HDL", "LDLCALC", "TRIG", "CHOLHDL", "LDLDIRECT" in the last 72 hours. Thyroid Function Tests: No results for input(s): "TSH", "T4TOTAL", "FREET4", "T3FREE", "THYROIDAB" in the last 72 hours. Anemia Panel: No results for input(s): "VITAMINB12", "FOLATE", "FERRITIN", "TIBC",  "IRON", "RETICCTPCT" in the last 72 hours.  Sepsis Labs: No results for input(s): "PROCALCITON", "LATICACIDVEN" in the last 168 hours.  No results found for this or any previous visit (from the past 240 hour(s)).       Radiology Studies: No results found.      Scheduled Meds:  fluticasone furoate-vilanterol  1 puff Inhalation Daily   lidocaine  1 patch Transdermal Q24H   losartan  25 mg Oral Daily   metoprolol succinate  50 mg Oral Daily   nicotine  21 mg Transdermal Daily   sodium chloride flush  3 mL Intravenous Q12H   sodium chloride flush  3 mL Intravenous Q12H   spironolactone  12.5 mg Oral Daily   umeclidinium bromide  1 puff Inhalation Daily   Continuous Infusions:  sodium chloride     ferumoxytol (FERAHEME) 510 mg in sodium chloride 0.9 % 100 mL IVPB     heparin 1,700 Units/hr (11/21/22 0100)          Glade Lloyd, MD [facility] 11/21/2022, 7:42 AM

## 2022-11-21 NOTE — Progress Notes (Signed)
For right now, we are in a "holding pattern" until we have better handle on the adrenal mass.  This is being evaluated for the possibility of pheochromocytoma.  I would think that this would be highly unlikely.  However, I totally understand why no biopsies are can be done until the tests are done to evaluate for pheochromocytoma.  She does have a issue with heart failure.  She had an echocardiogram that was done which showed a ejection fraction of 30 - 35%.  She is on heparin right now.  This is for the pulmonary emboli.  She had negative Dopplers of the legs.  I would keep her on the heparin until we know that she is not can have any biopsy or biopsies already completed.  Again, does not wish and I need to do right now.  I really do not have any other suggestions.  This is all about getting tissue.  Where the tissue is obtained from is important as the adrenal will make a histologic diagnosis but will also allow [place] to determine the stage.  Her labs today show white cell count of 11.5.  Hemoglobin 9.9.  Platelet count 380,000.  She is on deficient.  Her stools are all negative.  I would go ahead and give her some IV iron.  Again, we really are not able to move forward until we know what is the adrenal gland-pheochromocytoma or not.   Christin Bach, MD  Lysle Morales 19:26

## 2022-11-22 DIAGNOSIS — I2699 Other pulmonary embolism without acute cor pulmonale: Secondary | ICD-10-CM | POA: Diagnosis not present

## 2022-11-22 DIAGNOSIS — I5020 Unspecified systolic (congestive) heart failure: Secondary | ICD-10-CM | POA: Diagnosis not present

## 2022-11-22 DIAGNOSIS — R918 Other nonspecific abnormal finding of lung field: Secondary | ICD-10-CM | POA: Diagnosis not present

## 2022-11-22 DIAGNOSIS — E278 Other specified disorders of adrenal gland: Secondary | ICD-10-CM | POA: Diagnosis not present

## 2022-11-22 DIAGNOSIS — I420 Dilated cardiomyopathy: Secondary | ICD-10-CM | POA: Diagnosis not present

## 2022-11-22 LAB — CBC
HCT: 30.6 % — ABNORMAL LOW (ref 36.0–46.0)
Hemoglobin: 10.2 g/dL — ABNORMAL LOW (ref 12.0–15.0)
MCH: 29.5 pg (ref 26.0–34.0)
MCHC: 33.3 g/dL (ref 30.0–36.0)
MCV: 88.4 fL (ref 80.0–100.0)
Platelets: 413 10*3/uL — ABNORMAL HIGH (ref 150–400)
RBC: 3.46 MIL/uL — ABNORMAL LOW (ref 3.87–5.11)
RDW: 15.2 % (ref 11.5–15.5)
WBC: 11.2 10*3/uL — ABNORMAL HIGH (ref 4.0–10.5)
nRBC: 0.2 % (ref 0.0–0.2)

## 2022-11-22 LAB — BASIC METABOLIC PANEL
Anion gap: 10 (ref 5–15)
BUN: 12 mg/dL (ref 8–23)
CO2: 24 mmol/L (ref 22–32)
Calcium: 8.9 mg/dL (ref 8.9–10.3)
Chloride: 103 mmol/L (ref 98–111)
Creatinine, Ser: 0.97 mg/dL (ref 0.44–1.00)
GFR, Estimated: >60 mL/min (ref >60)
Glucose, Bld: 110 mg/dL — ABNORMAL HIGH (ref 70–99)
Potassium: 3.8 mmol/L (ref 3.5–5.1)
Sodium: 137 mmol/L (ref 135–145)

## 2022-11-22 LAB — MAGNESIUM: Magnesium: 2 mg/dL (ref 1.7–2.4)

## 2022-11-22 LAB — HEPARIN LEVEL (UNFRACTIONATED): Heparin Unfractionated: 0.54 IU/mL (ref 0.30–0.70)

## 2022-11-22 NOTE — Progress Notes (Signed)
Mobility Specialist - Progress Note   11/22/22 1031  Mobility  Activity Ambulated independently to bathroom;Ambulated independently in hallway  Level of Assistance Independent  Assistive Device Other (Comment) (IV Pole)  Distance Ambulated (ft) 350 ft  Range of Motion/Exercises Active  Activity Response Tolerated well  Mobility Referral Yes  $Mobility charge 1 Mobility  Mobility Specialist Start Time (ACUTE ONLY) 1018  Mobility Specialist Stop Time (ACUTE ONLY) 1030  Mobility Specialist Time Calculation (min) (ACUTE ONLY) 12 min   Pt was found on recliner chair and agreeable to ambulate. Assisted to bathroom prior to session. Had no complaints during session. At EOS returned to [facility] with all needs met. Call bell in reach.  Billey Chang Mobility Specialist

## 2022-11-22 NOTE — Progress Notes (Signed)
ANTICOAGULATION CONSULT NOTE - Follow Up Consult  Pharmacy Consult for Heparin Indication: pulmonary embolus  No Known Allergies  Patient Measurements: Height: 5\' 4"  (162.6 cm) Weight: 61.1 kg (134 lb 9.6 oz) IBW/kg (Calculated) : 54.7 Heparin Dosing Weight: 63.5kg  Vital Signs: Temp: 98.9 F (37.2 C) (06/18 0454) Temp Source: Oral (06/18 0454) BP: 146/96 (06/18 0454) Pulse Rate: 72 (06/18 0454)  Labs: Recent Labs    11/20/22 0413 11/21/22 0347 11/21/22 0512 11/22/22 0351  HGB 9.4* 9.9*  --  10.2*  HCT 29.0* 31.3*  --  30.6*  PLT 363 380  --  413*  HEPARINUNFRC 0.30  --  0.63 0.54  CREATININE  --  0.91  --  0.97     Estimated Creatinine Clearance: 45.9 mL/min (by C-G formula based on SCr of 0.97 mg/dL).   Assessment: Patient present to ED with left sides chest discomfort and abdominal pain. CTA shows segmental pulmonary emboli right upper lobe greater than left lower lobe. Small amount of clot burden. No history of oral anticoagulants.   Today, 11/22/2022 Heparin Level= 0.54, remains therapeutic on IV heparin at 1700 units/hr Hgb 10.2 g/dL - low, but stable Plt 161 K/uL, slightly elevated No bleeding or line issues noted per RN  Goal of Therapy:  Heparin level 0.3-0.7 units/ml Monitor platelets by anticoagulation protocol: Yes   Plan:  Continue heparin at 1700 units/hr Daily HL and CBC Continue to monitor H&H and platelets Monitor for signs/symptoms of bleeding Follow plans for long term anticoagulation once invasive procedures done   Tacy Learn, PharmD Clinical Pharmacist 11/22/2022 8:23 AM

## 2022-11-22 NOTE — Progress Notes (Signed)
PROGRESS NOTE    Erin Pacheco  ZOX:[id number] DOB: [birth year] DOA: 11/16/2022 PCP: Renaye Rakers, MD   Brief Narrative:  72 year old-year-old female with history of hypertension and tobacco abuse presented with abdominal pain along with cough and productive sputum.  On presentation, D-dimer was 3.14; CTA chest showed segmental pulmonary emboli right upper lobe greater than left upper lobe along with spiculated left upper lobe mass measuring 2.5 cm and right adrenal mass.  Echo showed EF of 30 to 35% with global hypokinesis.  She was started on heparin drip and transferred to [facility] as per patient/family request.  Pulmonary/IR/cardiology/oncology consulted. Patient currently on heparin drip.  IR recommended to rule out pheochromocytoma before proceeding with adrenal biopsy.  Assessment & Plan:   Acute pulmonary embolism -Imaging as above.  Currently on heparin drip. -Lower extremity duplex ultrasound was negative for DVT. -Will transition to DOAC once cleared by oncology -Currently on room air. 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   -PCCM recommended IR consult for possible adrenal biopsy.  IR consulted: Recommended ruling out pheochromocytoma before proceeding with adrenal biopsy.  Plasma metanephrines normal.  24-hour urine metanephrines pending.  I have notified IR/Dr. Lowella Dandy via secure chat today that plasma metanephrines are normal -Oncology following: Recommending to complete biopsy inpatient to have tissue diagnosis.  MRI of brain did not show any metastatic disease.  Nuclear medicine whole-body scan was negative for osseous metastatic disease.  Possible acute systolic heart failure -Echo showed EF of 30 to 35%.  New diagnosis.  Cardiology following.  Currently on metoprolol succinate, spironolactone and losartan. Strict input and output.  Daily weights.  Fluid  restriction.  AKI versus CKD -Creatinine 1.26 on presentation.  No recent renal function available.  Creatinine 0.97 today.  Possible COPD Tobacco abuse -Pulmonary following intermittently: Recommended to contact PCCM if additional biopsy is nondiagnostic or if results are not back prior to discharge and they can arrange outpatient workup of lung mass.  Continue current inhaled regimen.  Will need outpatient PFTs -Patient was counseled regarding tobacco cessation by admitting hospitalist -Continue nicotine patch  Hypertension -Monitor blood pressure.  Continue metoprolol, spironolactone and losartan as per cardiology.  Amlodipine has been discontinued.    Normocytic anemia -Possibly anemia of chronic disease.  Hemoglobin stable.  No signs of bleeding.  Leukocytosis -Possibly reactive.    DVT prophylaxis: Heparin drip Code Status: Full Family Communication: Daughter at bedside  Disposition Plan: Status is: Inpatient Remains inpatient appropriate because: Of severity of illness  Consultants: Pulmonary/IR/cardiology/oncology  Procedures: Echo  Antimicrobials: None   Subjective: Patient seen and examined at bedside.  No fever, vomiting, worsening abdominal pain reported Objective: Vitals:   11/21/22 1244 11/21/22 2131 11/22/22 0454 11/22/22 0500  BP: 133/78 (!) 154/72 (!) 146/96   Pulse: 67 74 72   Resp: 20 19 15    Temp: 98.2 F (36.8 C) 99.6 F (37.6 C) 98.9 F (37.2 C)   TempSrc: Oral Oral Oral   SpO2: 100% 97% 94%   Weight:    61.1 kg  Height:        Intake/Output Summary (Last 24 hours) at 11/22/2022 0757 Last data filed at 11/22/2022 0434 Gross per 24 hour  Intake 992.63 ml  Output 900 ml  Net 92.63 ml    Filed Weights   11/20/22 0444 11/21/22 0613 11/22/22 0500  Weight: 63.3 kg 62.3 kg 61.1 kg  Examination:  General: On room air.  No distress currently.  Chronically ill and deconditioned looking. ENT/neck: No obvious neck masses palpable or  obvious JVD elevation noted respiratory: Decreased breath sounds at bases bilaterally with some crackles  CVS: Rate controlled; S1 and S2 are heard abdominal: Soft, nontender, distended slightly; no organomegaly, normal bowel sounds heard. Extremities: Bilateral lower extremity edema; no cyanosis  CNS: Alert and oriented.  Slow to respond.  No focal neurologic deficits noted  lymph: No lymphadenopathy palpable  skin: No obvious lesions/ecchymosis psych: Flat affect.  Not agitated  musculoskeletal: No obvious joint swelling/deformity  Data Reviewed: I have personally reviewed following labs and imaging studies  CBC: Recent Labs  Lab 11/18/22 0357 11/19/22 0438 11/20/22 0413 11/21/22 0347 11/22/22 0351  WBC 12.4* 11.8* 11.1* 11.5* 11.2*  HGB 10.8* 9.9* 9.4* 9.9* 10.2*  HCT 32.8* 29.8* 29.0* 31.3* 30.6*  MCV 88.4 87.6 89.5 90.2 88.4  PLT 368 356 363 380 413*    Basic Metabolic Panel: Recent Labs  Lab 11/16/22 0849 11/18/22 0357 11/21/22 0347 11/22/22 0351  NA 137 135 136 137  K 3.9 3.7 4.1 3.8  CL 100 99 104 103  CO2 28 24 22 24   GLUCOSE 113* 97 103* 110*  BUN 23 16 12 12   CREATININE 1.26* 1.04* 0.91 0.97  CALCIUM 9.2 8.6* 8.6* 8.9  MG  --  2.3 2.1 2.0    GFR: Estimated Creatinine Clearance: 45.9 mL/min (by C-G formula based on SCr of 0.97 mg/dL). Liver Function Tests: No results for input(s): "AST", "ALT", "ALKPHOS", "BILITOT", "PROT", "ALBUMIN" in the last 168 hours. No results for input(s): "LIPASE", "AMYLASE" in the last 168 hours. No results for input(s): "AMMONIA" in the last 168 hours. Coagulation Profile: Recent Labs  Lab 11/18/22 0357  INR 1.3*    Cardiac Enzymes: No results for input(s): "CKTOTAL", "CKMB", "CKMBINDEX", "TROPONINI" in the last 168 hours. BNP (last 3 results) No results for input(s): "PROBNP" in the last 8760 hours. HbA1C: No results for input(s): "HGBA1C" in the last 72 hours. CBG: No results for input(s): "GLUCAP" in the last  168 hours. Lipid Profile: No results for input(s): "CHOL", "HDL", "LDLCALC", "TRIG", "CHOLHDL", "LDLDIRECT" in the last 72 hours. Thyroid Function Tests: No results for input(s): "TSH", "T4TOTAL", "FREET4", "T3FREE", "THYROIDAB" in the last 72 hours. Anemia Panel: No results for input(s): "VITAMINB12", "FOLATE", "FERRITIN", "TIBC", "IRON", "RETICCTPCT" in the last 72 hours.  Sepsis Labs: No results for input(s): "PROCALCITON", "LATICACIDVEN" in the last 168 hours.  No results found for this or any previous visit (from the past 240 hour(s)).       Radiology Studies: No results found.      Scheduled Meds:  fluticasone furoate-vilanterol  1 puff Inhalation Daily   lidocaine  1 patch Transdermal Q24H   losartan  25 mg Oral Daily   metoprolol succinate  50 mg Oral Daily   nicotine  21 mg Transdermal Daily   sodium chloride flush  3 mL Intravenous Q12H   sodium chloride flush  3 mL Intravenous Q12H   spironolactone  12.5 mg Oral Daily   umeclidinium bromide  1 puff Inhalation Daily   Continuous Infusions:  sodium chloride     heparin 1,700 Units/hr (11/22/22 0434)          Glade Lloyd, MD [facility] 11/22/2022, 7:57 AM

## 2022-11-22 NOTE — Care Management Important Message (Signed)
Important Message  Patient Details IM Letter given. Name: Erin Pacheco MRN: [id number] Date of Birth: 1950/10/18   Medicare Important Message Given:  Yes     Caren Macadam 11/22/2022, 9:42 AM

## 2022-11-22 NOTE — Progress Notes (Signed)
Rounding Note    Patient Name: Erin Pacheco Date of Encounter: 11/22/2022  [facility] Cardiologist: Rollene Rotunda, MD   Subjective   No longer on tele   Up in chair no SOB no chest pain  Inpatient Medications    Scheduled Meds:  fluticasone furoate-vilanterol  1 puff Inhalation Daily   lidocaine  1 patch Transdermal Q24H   losartan  25 mg Oral Daily   metoprolol succinate  50 mg Oral Daily   nicotine  21 mg Transdermal Daily   sodium chloride flush  3 mL Intravenous Q12H   sodium chloride flush  3 mL Intravenous Q12H   spironolactone  12.5 mg Oral Daily   umeclidinium bromide  1 puff Inhalation Daily   Continuous Infusions:  sodium chloride     heparin 1,700 Units/hr (11/22/22 0434)   PRN Meds: sodium chloride, acetaminophen **OR** acetaminophen, albuterol, ALPRAZolam, bisacodyl, fentaNYL (SUBLIMAZE) injection, guaiFENesin, hydrALAZINE, ondansetron **OR** ondansetron (ZOFRAN) IV, oxyCODONE, polyethylene glycol, polyvinyl alcohol, sodium chloride flush, traZODone   Vital Signs    Vitals:   11/21/22 1244 11/21/22 2131 11/22/22 0454 11/22/22 0500  BP: 133/78 (!) 154/72 (!) 146/96   Pulse: 67 74 72   Resp: 20 19 15    Temp: 98.2 F (36.8 C) 99.6 F (37.6 C) 98.9 F (37.2 C)   TempSrc: Oral Oral Oral   SpO2: 100% 97% 94%   Weight:    61.1 kg  Height:        Intake/Output Summary (Last 24 hours) at 11/22/2022 0843 Last data filed at 11/22/2022 0434 Gross per 24 hour  Intake 992.63 ml  Output 900 ml  Net 92.63 ml      11/22/2022    5:00 AM 11/21/2022    6:13 AM 11/20/2022    4:44 AM  Last 3 Weights  Weight (lbs) 134 lb 9.6 oz 137 lb 5.6 oz 139 lb 8.8 oz  Weight (kg) 61.054 kg 62.3 kg 63.3 kg      Telemetry    none - Personally Reviewed  ECG    No new - Personally Reviewed  Physical Exam   GEN: No acute distress.   Neck: No JVD sitting up in chair Cardiac: RRR, no murmurs, rubs, or gallops.  Respiratory: Clear to auscultation  bilaterally. GI: Soft, nontender, non-distended  MS: No edema; No deformity. Neuro:  Nonfocal  Psych: Normal affect   Labs    High Sensitivity Troponin:   Recent Labs  Lab 11/16/22 0849 11/16/22 1238  TROPONINIHS 9 11     Chemistry Recent Labs  Lab 11/18/22 0357 11/21/22 0347 11/22/22 0351  NA 135 136 137  K 3.7 4.1 3.8  CL 99 104 103  CO2 24 22 24   GLUCOSE 97 103* 110*  BUN 16 12 12   CREATININE 1.04* 0.91 0.97  CALCIUM 8.6* 8.6* 8.9  MG 2.3 2.1 2.0  GFRNONAA 57* >60 >60  ANIONGAP 12 10 10     Lipids No results for input(s): "CHOL", "TRIG", "HDL", "LABVLDL", "LDLCALC", "CHOLHDL" in the last 168 hours.  Hematology Recent Labs  Lab 11/20/22 0413 11/21/22 0347 11/22/22 0351  WBC 11.1* 11.5* 11.2*  RBC 3.24* 3.47* 3.46*  HGB 9.4* 9.9* 10.2*  HCT 29.0* 31.3* 30.6*  MCV 89.5 90.2 88.4  MCH 29.0 28.5 29.5  MCHC 32.4 31.6 33.3  RDW 15.3 15.3 15.2  PLT 363 380 413*   Thyroid No results for input(s): "TSH", "FREET4" in the last 168 hours.  BNP Recent Labs  Lab 11/20/22 0413  BNP 54.1    DDimer  Recent Labs  Lab 11/16/22 0849  DDIMER 3.14*     Radiology    No results found.  Cardiac Studies   Echo 11/16/2022  1. Left ventricular ejection fraction, by estimation, is 30 to 35%. The  left ventricle has moderately decreased function. The left ventricle  demonstrates global hypokinesis. Left ventricular diastolic parameters are  indeterminate.   2. Right ventricular systolic function is normal. The right ventricular  size is normal.   3. Left atrial size was mildly dilated.   4. A small pericardial effusion is present. The pericardial effusion is  circumferential.   5. The mitral valve is normal in structure. No evidence of mitral valve  regurgitation. No evidence of mitral stenosis.   6. The aortic valve was not well visualized. Aortic valve regurgitation  is mild to moderate.   Patient Profile     72 y.o. female  PMH of tobacco abuse and HTN  presented with chest pain and persistent cough and found to have PE, LUL lung mass and R adrenal mass on CTA of chest now with decreased EF 30-35%.   Assessment & Plan    New LV dysfunction: EF 30 to 35%.  Last echocardiogram in 2003 showed normal EF.  She is not grossly volume overloaded Currently on ARB and Toprol BNP is 54.1   I/O is + 987 and wt 61.1 kg down 1 Kg from yesterday --aldactone added at 12.5    BP stable at 146/96   Acute pulmonary embolism: On IV heparin.  Once all invasive study completed, then will consider PE dosing of Xarelto or Eliquis   Left upper lobe lung mass and right adrenal mass: Per primary team and pulmonology service. Oncology on board, planning additional imaging w possible biopsy CT guided biopsy delayed waiting on metanephrine levels to r/o Pheo before any biopsy  MRI brain clear.   Low grade fever 100   4.  Tobacco use.  Possible COPD per IM   5. Anemia with Hgb 10.2 today      For questions or updates, please contact [facility] Please consult [email or web address] for contact info under        Signed, Nada Boozer, NP  11/22/2022, 8:43 AM

## 2022-11-23 ENCOUNTER — Inpatient Hospital Stay (HOSPITAL_COMMUNITY): Payer: Medicare HMO

## 2022-11-23 DIAGNOSIS — R918 Other nonspecific abnormal finding of lung field: Secondary | ICD-10-CM | POA: Diagnosis not present

## 2022-11-23 DIAGNOSIS — E278 Other specified disorders of adrenal gland: Secondary | ICD-10-CM | POA: Diagnosis not present

## 2022-11-23 DIAGNOSIS — D500 Iron deficiency anemia secondary to blood loss (chronic): Secondary | ICD-10-CM | POA: Diagnosis not present

## 2022-11-23 DIAGNOSIS — I2694 Multiple subsegmental pulmonary emboli without acute cor pulmonale: Secondary | ICD-10-CM | POA: Diagnosis not present

## 2022-11-23 LAB — BASIC METABOLIC PANEL
Anion gap: 9 (ref 5–15)
BUN: 12 mg/dL (ref 8–23)
CO2: 25 mmol/L (ref 22–32)
Calcium: 9.1 mg/dL (ref 8.9–10.3)
Chloride: 102 mmol/L (ref 98–111)
Creatinine, Ser: 0.95 mg/dL (ref 0.44–1.00)
GFR, Estimated: >60 mL/min (ref >60)
Glucose, Bld: 102 mg/dL — ABNORMAL HIGH (ref 70–99)
Potassium: 3.9 mmol/L (ref 3.5–5.1)
Sodium: 136 mmol/L (ref 135–145)

## 2022-11-23 LAB — CBC
HCT: 30.8 % — ABNORMAL LOW (ref 36.0–46.0)
Hemoglobin: 10 g/dL — ABNORMAL LOW (ref 12.0–15.0)
MCH: 28.6 pg (ref 26.0–34.0)
MCHC: 32.5 g/dL (ref 30.0–36.0)
MCV: 88 fL (ref 80.0–100.0)
Platelets: 431 10*3/uL — ABNORMAL HIGH (ref 150–400)
RBC: 3.5 MIL/uL — ABNORMAL LOW (ref 3.87–5.11)
RDW: 15.4 % (ref 11.5–15.5)
WBC: 12.6 10*3/uL — ABNORMAL HIGH (ref 4.0–10.5)
nRBC: 0.2 % (ref 0.0–0.2)

## 2022-11-23 LAB — HEPARIN LEVEL (UNFRACTIONATED): Heparin Unfractionated: 0.59 IU/mL (ref 0.30–0.70)

## 2022-11-23 LAB — MAGNESIUM: Magnesium: 2 mg/dL (ref 1.7–2.4)

## 2022-11-23 MED ORDER — DIPHENHYDRAMINE HCL 50 MG/ML IJ SOLN
INTRAMUSCULAR | Status: AC
  Filled 2022-11-23: qty 1

## 2022-11-23 MED ORDER — SODIUM CHLORIDE 0.9 % IV SOLN
510.0000 mg | Freq: Once | INTRAVENOUS | Status: AC
  Administered 2022-11-23: 510 mg via INTRAVENOUS
  Filled 2022-11-23: qty 17

## 2022-11-23 MED ORDER — MIDAZOLAM HCL 2 MG/2ML IJ SOLN
INTRAMUSCULAR | Status: AC
  Filled 2022-11-23: qty 4

## 2022-11-23 MED ORDER — MIDAZOLAM HCL 2 MG/2ML IJ SOLN
INTRAMUSCULAR | Status: AC | PRN
  Administered 2022-11-23 (×2): 1 mg via INTRAVENOUS

## 2022-11-23 MED ORDER — PERFLUTREN LIPID MICROSPHERE
1.0000 mL | INTRAVENOUS | Status: AC | PRN
  Administered 2022-11-23: 3 mL via INTRAVENOUS

## 2022-11-23 MED ORDER — FENTANYL CITRATE (PF) 100 MCG/2ML IJ SOLN
INTRAMUSCULAR | Status: AC
  Filled 2022-11-23: qty 4

## 2022-11-23 MED ORDER — HEPARIN (PORCINE) 25000 UT/250ML-% IV SOLN
1700.0000 [IU]/h | INTRAVENOUS | Status: DC
  Administered 2022-11-23 – 2022-11-25 (×3): 1700 [IU]/h via INTRAVENOUS
  Filled 2022-11-23 (×3): qty 250

## 2022-11-23 MED ORDER — FENTANYL CITRATE (PF) 100 MCG/2ML IJ SOLN
INTRAMUSCULAR | Status: AC | PRN
  Administered 2022-11-23 (×2): 50 ug via INTRAVENOUS

## 2022-11-23 MED ORDER — LIDOCAINE HCL (PF) 1 % IJ SOLN
INTRAMUSCULAR | Status: AC | PRN
  Administered 2022-11-23: 10 mL

## 2022-11-23 NOTE — TOC Progression Note (Addendum)
Transition of Care [facility]) - Progression Note    Patient Details  Name: Erin Pacheco MRN: [id number] Date of Birth: 05-09-1951  [facility] CM/SW Contact  Howell Rucks, RN Phone Number: 11/23/2022, 9:54 AM  Clinical Narrative:  Pt's dtr requesting Rollator, request sent to attending for order.     -10:35am  Order entered for Rollator, [employer] rep-Jermaine to deliver to bedside    Expected Discharge Plan: Home/Self Care Barriers to Discharge: Continued Medical Work up  Expected Discharge Plan and Services       Living arrangements for the past 2 months: Single Family Home                                       Social Determinants of Health (SDOH) Interventions SDOH Screenings   Food Insecurity: No Food Insecurity (11/17/2022)  Housing: Low Risk  (11/17/2022)  Transportation Needs: No Transportation Needs (11/17/2022)  Utilities: Not At Risk (11/17/2022)  Tobacco Use: High Risk (11/17/2022)    Readmission Risk Interventions    11/17/2022    9:59 AM  Readmission Risk Prevention Plan  Post Dischage Appt Complete  Medication Screening Complete  Transportation Screening Complete

## 2022-11-23 NOTE — Progress Notes (Signed)
Mobility Specialist - Progress Note   11/23/22 1217  Mobility  Activity Ambulated with assistance in hallway  Level of Assistance Modified independent, requires aide device or extra time  Assistive Device Front wheel walker  Distance Ambulated (ft) 350 ft  Range of Motion/Exercises Active  Activity Response Tolerated well  Mobility Referral Yes  $Mobility charge 1 Mobility  Mobility Specialist Start Time (ACUTE ONLY) 1204  Mobility Specialist Stop Time (ACUTE ONLY) 1215  Mobility Specialist Time Calculation (min) (ACUTE ONLY) 11 min   Pt was found on recliner chair and agreeable to ambulate. Had no complaints and at EOS returned to recliner chair with all needs met. Call bell in reach.  Billey Chang Mobility Specialist

## 2022-11-23 NOTE — Progress Notes (Signed)
Looks like the workup for pheochromocytoma was negative.  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  She feels okay.  There is no chest pain.  There is no cough.  Cardiology is helping with her congestive heart failure.  Her appetite is all right.  There is no nausea or vomiting.  She clearly is iron deficient.  Her iron saturation is only 6.  Serum iron is 15.  Hemoglobin is 10.  I will have to make sure that she gets some IV iron.  There is been no obvious bleeding.  There is no melena or bright red blood per rectum.  Her stool test have been negative.  She continues on heparin.  Once she has a biopsy, she will be put on to oral anticoagulation.  Her vital signs are all stable.  Temperature is 99.  Pulse 75.  Blood pressure 159/67.  Her lungs are clear bilaterally.  Cardiac exam regular rate and rhythm.  Abdomen is soft.  Bowel sounds are present.  There is no fluid wave.  There is no palpable liver or spleen tip.  Extremity shows no clubbing, cyanosis or edema.  Neurological exam is nonfocal.   At this point, we are trying to establish the diagnosis and the stage.  By doing a adrenal gland biopsy this can accomplish both tasks.  Hopefully, she can have this done today.  I have to make sure that she gets IV iron.  She lives open [place].  When she is discharged, she can [facility].   Christin Bach, MD  Isaiah 41:10

## 2022-11-23 NOTE — Consult Note (Signed)
Chief Complaint: Patient was seen in consultation today for CT-guided right adrenal mass biopsy Chief Complaint  Patient presents with   Abdominal Pain     Referring Physician(s): Ennever,P  Supervising Physician: Roanna Banning  Patient Status: [facility] - In-pt  History of Present Illness: Erin Pacheco is a 72 y.o. female with past medical history of arthritis, DDD, hypertension, tobacco abuse admitted to [facility] on 6/12 with abdominal pain/cough/productive sputum.  CT angiogram chest showed segmental pulmonary emboli right upper lobe greater than left upper lobe along with spiculated left upper lobe mass measuring 2.5 cm ,5.2 cm right adrenal mass and small pericardial effusion.  Echo showed ejection fraction of 30 to 35% with global hypokinesis.  Lower extremity duplex ultrasound was negative for DVT.  She was started on IV heparin.  No prior history of malignancy.  Request now received from oncology for right adrenal mass biopsy for further evaluation.  Past Medical History:  Diagnosis Date   Arthritis    Hypertension    Tobacco abuse     Past Surgical History:  Procedure Laterality Date   ABDOMINAL HYSTERECTOMY     BILATERAL OOPHORECTOMY     CATARACT EXTRACTION W/PHACO  12/27/2010   Procedure: CATARACT EXTRACTION PHACO AND INTRAOCULAR LENS PLACEMENT (IOC);  Surgeon: Gemma Payor;  Location: [facility];  Service: Ophthalmology;  Laterality: Right;   CATARACT EXTRACTION W/PHACO  03/28/2011   Procedure: CATARACT EXTRACTION PHACO AND INTRAOCULAR LENS PLACEMENT (IOC);  Surgeon: Gemma Payor;  Location: [facility];  Service: Ophthalmology;  Laterality: Left;  CDE 7.27    Allergies: Patient has no known allergies.  Medications: Prior to Admission medications   Medication Sig Start Date End Date Taking? Authorizing Provider  diclofenac (VOLTAREN) 75 MG EC tablet Take 75 mg by mouth 2 (two) times daily as needed for mild pain. 01/27/21  Yes [provider]  ibuprofen  (ADVIL,MOTRIN) 200 MG tablet Take 400-600 mg by mouth every 6 (six) hours as needed. Pain    Yes [provider]  Multiple Vitamin (MULTIVITAMIN) tablet Take 1 tablet by mouth daily.     Yes [provider]  Polyethyl Glycol-Propyl Glycol (SYSTANE OP) Apply 1 drop to eye daily as needed (dry eye). Dry Eyes   Yes [provider]  valsartan-hydrochlorothiazide (DIOVAN-HCT) 160-12.5 MG tablet Take 1 tablet by mouth daily. 12/05/20  Yes [provider]  aliskiren (TEKTURNA) 150 MG tablet Take 150 mg by mouth daily.   Patient not taking: Reported on 11/16/2022    [provider]  dexamethasone (DECADRON) 4 MG tablet Take 4 mg by mouth daily as needed. For arthritis pain Patient not taking: Reported on 11/16/2022    [provider]  LORazepam (ATIVAN) 1 MG tablet Take 1 tablet (1 mg total) by mouth 3 (three) times daily as needed for anxiety. Patient not taking: Reported on 11/16/2022 05/30/12   Donnetta Hutching, MD     Family History  Problem Relation Age of Onset   Ovarian cancer Mother    Anesthesia problems Neg Hx    Hypotension Neg Hx    Malignant hyperthermia Neg Hx    Pseudochol deficiency Neg Hx     Social History   Socioeconomic History   Marital status: Single    Spouse name: Not on file   Number of children: Not on file   Years of education: Not on file   Highest education level: Not on file  Occupational History   Not on file  Tobacco Use  Smoking status: Every Day    Packs/day: 0.50    Years: 30.00    Additional pack years: 0.00    Total pack years: 15.00    Types: Cigarettes   Smokeless tobacco: Never  Vaping Use   Vaping Use: Never used  Substance and Sexual Activity   Alcohol use: No   Drug use: No   Sexual activity: Yes    Birth control/protection: Surgical  Other Topics Concern   Not on file  Social History Narrative   Not on file   Social Determinants of Health   Financial Resource Strain: Not on file   Food Insecurity: No Food Insecurity (11/17/2022)   Hunger Vital Sign    Worried About Running Out of Food in the Last Year: Never true    Ran Out of Food in the Last Year: Never true  Transportation Needs: No Transportation Needs (11/17/2022)   PRAPARE - Administrator, Civil Service (Medical): No    Lack of Transportation (Non-Medical): No  Physical Activity: Not on file  Stress: Not on file  Social Connections: Not on file      Review of Systems currently denies fever, headache, chest pain, worsening dyspnea, abdominal/back pain, nausea, vomiting or bleeding.  She does have occasional cough  Vital Signs: BP (!) 159/67 (BP Location: Right Arm)   Pulse 75   Temp 99 F (37.2 C) (Oral)   Resp 18   Ht 5\' 4"  (1.626 m)   Wt 134 lb 11.2 oz (61.1 kg)   SpO2 95%   BMI 23.12 kg/m   Advance Care Plan: No documents on file   Physical Exam: Awake, alert.  Chest with slightly diminished breath sounds bases.  Heart with regular rate and rhythm.  Abdomen soft, positive bowel sounds, nontender.  No significant lower extremity edema.  Imaging: NM Bone Scan Whole Body  Result Date: 11/18/2022 CLINICAL DATA:  Lung cancer staging EXAM: NUCLEAR MEDICINE WHOLE BODY BONE SCAN TECHNIQUE: Whole body anterior and posterior images were obtained approximately 3 hours after intravenous injection of radiopharmaceutical. RADIOPHARMACEUTICALS:  22.0 mCi Technetium-7m MDP IV COMPARISON:  Chest CT 11/16/2022.  Abdomen pelvis CT 11/17/2022 FINDINGS: There is physiologic distribution radiotracer along the kidneys and bladder. Presumed injection site along the right antecubital region. Degenerative type areas of uptake seen along the left shoulder greater than right, midthoracic, lower lumbar spine and along the sacroiliac joint regions. Also degenerative type uptake along the ankles and feet. No additional areas of abnormal radiotracer uptake to suggest osseous metastatic disease. IMPRESSION: Multifocal  degenerative type uptake. No scintigraphic evidence of osseous metastatic disease. Electronically Signed   By: Karen Kays M.D.   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 Pulmonary emboli. Staging. EXAM: MRI HEAD WITHOUT AND WITH CONTRAST TECHNIQUE: Multiplanar, multiecho pulse sequences of the brain and surrounding structures were obtained without and with intravenous contrast. CONTRAST:  6mL GADAVIST GADOBUTROL 1 MMOL/ML IV SOLN COMPARISON:  Recent CT Chest, Abdomen, and Pelvis 11/16/2022 and 11/17/2022. Head CT 08/30/2017. FINDINGS: Brain: No restricted diffusion to suggest acute infarction. No midline shift, mass effect, evidence of mass lesion, ventriculomegaly, extra-axial collection or acute intracranial hemorrhage. Cervicomedullary junction and pituitary are within normal limits. No abnormal enhancement identified. No abnormal dural thickening or enhancement identified. Cerebral volume is within normal limits for age. Wallace Cullens and white matter signal is within normal limits for age  throughout the brain. No cortical encephalomalacia or chronic cerebral blood products identified. Vascular: Major intracranial vascular flow voids are preserved. The major dural venous sinuses are enhancing and appear to be patent. Skull and upper cervical spine: Chronic cervical spine disc and endplate degeneration with degenerative appearing endplate marrow signal changes. Background bone marrow signal is within normal limits. No suspicious osseous lesion identified. Sinuses/Orbits: Postoperative changes to both globes, otherwise negative orbits. Trace paranasal sinus mucosal thickening. Other: Mastoids are clear. Visible internal auditory structures appear normal. Numerous small but benign appearing nasopharyngeal retention cysts, T2 hyperintense and nonenhancing following contrast. Negative visible scalp  and face. IMPRESSION: No metastatic disease or acute intracranial abnormality. Normal for age MRI appearance of the brain. Electronically Signed   By: Odessa Fleming M.D.   On: 11/18/2022 07:30   CT ABDOMEN PELVIS W WO CONTRAST  Result Date: 11/17/2022 CLINICAL DATA:  Spiculated 2.4 cm left upper lobe lung nodule on recent chest CT, which also partially included a right adrenal mass concerning for the possibility of lung cancer with a metastatic lesion. Today's exam is for further characterization of the right adrenal mass. EXAM: CT ABDOMEN AND PELVIS WITHOUT AND WITH CONTRAST TECHNIQUE: Multidetector CT imaging of the abdomen and pelvis was performed following the standard protocol before and following the bolus administration of intravenous contrast. RADIATION DOSE REDUCTION: This exam was performed according to the departmental dose-optimization program which includes automated exposure control, adjustment of the mA and/or kV according to patient size and/or use of iterative reconstruction technique. CONTRAST:  OMNIPAQUE IOHEXOL 300 MG/ML  SOLN COMPARISON:  Overlapping portions CT chest 11/16/2022 FINDINGS: Lower chest: Atelectasis noted along both lung bases, mildly increased in the right middle lobe compared to previous, and with some hazy non dependent peripheral opacity in the left lower lobe for example on image 12 series 4 similar to previous. Pneumonia is not excluded. Mild cardiomegaly is present. Descending thoracic aortic atherosclerosis. Hepatobiliary: Unremarkable Pancreas: Unremarkable Spleen: Unremarkable Adrenals/Urinary Tract: The left adrenal gland appears normal. 5.2 by 3.1 by 4.3 cm right adrenal mass demonstrates precontrast internal density of 33 Hounsfield units and a absolute washout of -58% and relative washout of -25%, both indeterminate. The lesion demonstrates a higher degree of enhancement peripherally rather than centrally, potentially reflecting some degree of central necrosis.  Morphologically the lesion is suspicious and concerning for potential malignancy and there is some faint stranding along its inferior margin. A 1.2 cm simple cyst of the right kidney upper pole is present on image 49 series 2. No further imaging workup of this lesion is indicated. Mild scarring in the left kidney noted. Stomach/Bowel: Unremarkable Vascular/Lymphatic: Atherosclerosis is present, including aortoiliac atherosclerotic disease. There is substantial atheromatous calcification in the superior mesenteric artery, without overt occlusion. Mild atheromatous plaque proximally in the celiac trunk without occlusion. No pathologic adenopathy. Reproductive: Uterus absent.  Adnexa unremarkable. Other: No supplemental non-categorized findings. Musculoskeletal: Dextroconvex lumbar scoliosis with rotary component. Grade 1 degenerative anterolisthesis at L2-3 and L3-4 with substantial loss of disc height and endplate sclerosis at the L4-5 and L5-S1 levels. In conjunction with intervertebral and facet spurring as well as degenerative disc disease, there is substantial impingement at all levels between L2 and S1. Small bilateral groin hernias contain adipose tissue. IMPRESSION: 1. 5.2 cm right adrenal mass is suspicious for malignancy. Biopsy or other appropriate confirmation for example with PET-CT imaging would be suggested. 2. Aortic atherosclerosis. 3. Substantial impingement at all levels between L2 and S1 due to scoliosis, spondylosis, and  degenerative disc disease. 4. Small bilateral groin hernias contain adipose tissue. 5. Mild cardiomegaly. 6. Atelectasis along both lung bases, mildly increased in the right middle lobe compared to previous, and with some hazy non dependent peripheral opacity in the left lower lobe similar to previous. Pneumonia is not excluded. Aortic Atherosclerosis (ICD10-I70.0). Electronically Signed   By: Gaylyn Rong M.D.   On: 11/17/2022 18:54   VAS [place] LOWER EXTREMITY VENOUS  (DVT)  Result Date: 11/17/2022  Lower Venous DVT Study Patient Name:  Erin Pacheco  Date of Exam:   11/17/2022 Medical Rec #: [id number]         Accession #:    [id number] Date of Birth: 21-May-1951         Patient Gender: F Patient Age:   43 years Exam Location:  [facility] Procedure:      VAS [place] LOWER EXTREMITY VENOUS (DVT) Referring Phys: COURAGE EMOKPAE --------------------------------------------------------------------------------  Indications: Swelling.  Risk Factors: Confirmed PE. Anticoagulation: Heparin. Limitations: Poor ultrasound/tissue interface. Comparison Study: No prior studies. Performing Technologist: Chanda Busing RVT  Examination Guidelines: A complete evaluation includes B-mode imaging, spectral Doppler, color Doppler, and power Doppler as needed of all accessible portions of each vessel. Bilateral testing is considered an integral part of a complete examination. Limited examinations for reoccurring indications may be performed as noted. The reflux portion of the exam is performed with the patient in reverse Trendelenburg.  +---------+---------------+---------+-----------+----------+--------------+ RIGHT    CompressibilityPhasicitySpontaneityPropertiesThrombus Aging +---------+---------------+---------+-----------+----------+--------------+ CFV      Full           Yes      Yes                                 +---------+---------------+---------+-----------+----------+--------------+ SFJ      Full                                                        +---------+---------------+---------+-----------+----------+--------------+ FV Prox  Full                                                        +---------+---------------+---------+-----------+----------+--------------+ FV Mid   Full                                                        +---------+---------------+---------+-----------+----------+--------------+ FV DistalFull                                                         +---------+---------------+---------+-----------+----------+--------------+ PFV      Full                                                        +---------+---------------+---------+-----------+----------+--------------+  POP      Full           Yes      Yes                                 +---------+---------------+---------+-----------+----------+--------------+ PTV      Full                                                        +---------+---------------+---------+-----------+----------+--------------+ PERO     Full                                                        +---------+---------------+---------+-----------+----------+--------------+   +---------+---------------+---------+-----------+----------+--------------+ LEFT     CompressibilityPhasicitySpontaneityPropertiesThrombus Aging +---------+---------------+---------+-----------+----------+--------------+ CFV      Full           Yes      Yes                                 +---------+---------------+---------+-----------+----------+--------------+ SFJ      Full                                                        +---------+---------------+---------+-----------+----------+--------------+ FV Prox  Full                                                        +---------+---------------+---------+-----------+----------+--------------+ FV Mid   Full                                                        +---------+---------------+---------+-----------+----------+--------------+ FV DistalFull                                                        +---------+---------------+---------+-----------+----------+--------------+ PFV      Full                                                        +---------+---------------+---------+-----------+----------+--------------+ POP      Full           Yes      Yes                                  +---------+---------------+---------+-----------+----------+--------------+  PTV      Full                                                        +---------+---------------+---------+-----------+----------+--------------+ PERO     Full                                                        +---------+---------------+---------+-----------+----------+--------------+     Summary: RIGHT: - There is no evidence of deep vein thrombosis in the lower extremity.  - No cystic structure found in the popliteal fossa.  LEFT: - There is no evidence of deep vein thrombosis in the lower extremity.  - No cystic structure found in the popliteal fossa.  *See table(s) above for measurements and observations. Electronically signed by Lemar Livings MD on 11/17/2022 at 11:04:10 AM.    Final    ECHOCARDIOGRAM COMPLETE  Result Date: 11/16/2022    ECHOCARDIOGRAM REPORT   Patient Name:   Erin Pacheco Date of Exam: 11/16/2022 Medical Rec #:  [id number]        Height:       64.0 in Accession #:    [id number]       Weight:       140.0 lb Date of Birth:  1950/07/19        BSA:          1.681 m Patient Age:    71 years         BP:           158/115 mmHg Patient Gender: F                HR:           78 bpm. [facility] Procedure: 2D Echo, Cardiac Doppler and Color Doppler Indications:    Pulmonary Embolus I26.09  History:        Patient has prior history of Echocardiogram examinations, most                 recent 11/16/2001. Signs/Symptoms:Murmur; Risk                 Factors:Hypertension.  Sonographer:    Aron Baba Referring Phys: [id number] COURAGE EMOKPAE IMPRESSIONS  1. Left ventricular ejection fraction, by estimation, is 30 to 35%. The left ventricle has moderately decreased function. The left ventricle demonstrates global hypokinesis. Left ventricular diastolic parameters are indeterminate.  2. Right ventricular systolic function is normal. The right ventricular size is normal.  3. Left atrial size was mildly  dilated.  4. A small pericardial effusion is present. The pericardial effusion is circumferential.  5. The mitral valve is normal in structure. No evidence of mitral valve regurgitation. No evidence of mitral stenosis.  6. The aortic valve was not well visualized. Aortic valve regurgitation is mild to moderate. FINDINGS  Left Ventricle: Left ventricular ejection fraction, by estimation, is 30 to 35%. The left ventricle has moderately decreased function. The left ventricle demonstrates global hypokinesis. Definity contrast agent was given IV to delineate the left ventricular endocardial borders. The left ventricular internal cavity size was normal in size. There is no left ventricular hypertrophy.  Left ventricular diastolic parameters are indeterminate. Right Ventricle: The right ventricular size is normal. Right vetricular wall thickness was not well visualized. Right ventricular systolic function is normal. Left Atrium: Left atrial size was mildly dilated. Right Atrium: Right atrial size was normal in size. Pericardium: A small pericardial effusion is present. The pericardial effusion is circumferential. Mitral Valve: The mitral valve is normal in structure. No evidence of mitral valve regurgitation. No evidence of mitral valve stenosis. Tricuspid Valve: The tricuspid valve is normal in structure. Tricuspid valve regurgitation is mild . No evidence of tricuspid stenosis. Aortic Valve: The aortic valve was not well visualized. Aortic valve regurgitation is mild to moderate. Aortic regurgitation PHT measures 442 msec. Aortic valve mean gradient measures 8.0 mmHg. Aortic valve peak gradient measures 16.6 mmHg. Aortic valve area, by VTI measures 1.49 cm. Pulmonic Valve: The pulmonic valve was not well visualized. Pulmonic valve regurgitation is not visualized. No evidence of pulmonic stenosis. Aorta: The aortic root is normal in size and structure. IAS/Shunts: The interatrial septum was not well visualized.  LEFT  VENTRICLE PLAX 2D LVIDd:         4.45 cm   Diastology LVIDs:         4.00 cm   LV e' medial:    4.99 cm/s LV PW:         1.20 cm   LV E/e' medial:  38.5 LV IVS:        0.90 cm   LV e' lateral:   5.43 cm/s LVOT diam:     2.00 cm   LV E/e' lateral: 35.4 LV SV:         53 LV SV Index:   32 LVOT Area:     3.14 cm  RIGHT VENTRICLE RV S prime:     13.10 cm/s TAPSE (M-mode): 1.6 cm LEFT ATRIUM           Index        RIGHT ATRIUM           Index LA diam:      2.70 cm 1.61 cm/m   RA Area:     14.00 cm LA Vol (A2C): 36.5 ml 21.71 ml/m  RA Volume:   34.70 ml  20.64 ml/m LA Vol (A4C): 58.3 ml 34.68 ml/m  AORTIC VALVE                     PULMONIC VALVE AV Area (Vmax):    1.57 cm      PR End Diast Vel: 5.20 msec AV Area (Vmean):   1.47 cm AV Area (VTI):     1.49 cm AV Vmax:           204.00 cm/s AV Vmean:          128.000 cm/s AV VTI:            0.357 m AV Peak Grad:      16.6 mmHg AV Mean Grad:      8.0 mmHg LVOT Vmax:         102.19 cm/s LVOT Vmean:        59.811 cm/s LVOT VTI:          0.170 m LVOT/AV VTI ratio: 0.48 AI PHT:            442 msec  AORTA Ao Root diam: 3.00 cm Ao Asc diam:  3.10 cm MITRAL VALVE  TRICUSPID VALVE MV Area (PHT): 9.25 cm     TR Peak grad:   23.8 mmHg MV Decel Time: 82 msec      TR Vmax:        244.00 cm/s MR Peak grad: 7.7 mmHg MR Vmax:      139.00 cm/s   SHUNTS MV E velocity: 192.00 cm/s  Systemic VTI:  0.17 m MV A velocity: 48.40 cm/s   Systemic Diam: 2.00 cm MV E/A ratio:  3.97 Dina Rich MD Electronically signed by Dina Rich MD Signature Date/Time: 11/16/2022/3:04:20 PM    Final    CT Angio Chest Pulmonary Embolism (PE) W or WO Contrast  Result Date: 11/16/2022 CLINICAL DATA:  Chest pain EXAM: CT ANGIOGRAPHY CHEST WITH CONTRAST TECHNIQUE: Multidetector CT imaging of the chest was performed using the standard protocol during bolus administration of intravenous contrast. Multiplanar CT image reconstructions and MIPs were obtained to evaluate the vascular  anatomy. RADIATION DOSE REDUCTION: This exam was performed according to the departmental dose-optimization program which includes automated exposure control, adjustment of the mA and/or kV according to patient size and/or use of iterative reconstruction technique. CONTRAST:  75mL OMNIPAQUE IOHEXOL 350 MG/ML SOLN COMPARISON:  X-ray 11/16/2022 FINDINGS: Cardiovascular: Heart is mildly enlarged. Small pericardial effusion. Coronary artery calcifications are seen. The thoracic aorta has a normal course and caliber with scattered atherosclerotic plaque. Multiple areas of pulmonary emboli identified most striking in segmental branches in the right upper lobe such as axial series 4, image 43. Additional subtle area in the anterior left lower lobe on image 54. No large or central embolus. No signs of right heart strain at this time. Mediastinum/Nodes: Patulous esophagus. Small thyroid gland. No specific abnormal lymph node enlargement identified in the axillary regions, hilum or mediastinum. Lungs/Pleura: Centrilobular emphysematous changes are seen greatest in the upper lung zones. There is spiculated left upper lobe mass near the margin of the hilum and mediastinum measuring on series 6, image 49 at 2.5 by 2.1 cm. There is some linear opacity lung bases likely scar or atelectasis. Subtle opacity as well in the left lower lobe. Trace left pleural fluid. No pneumothorax. Breathing motion. Additional nodule in the lingula on series 6 image 86 measuring 7 mm. Upper Abdomen: Right adrenal mass identified measuring 4.5 by 2.9 cm. Nodular thickening of the left adrenal gland as well. Musculoskeletal: Degenerative changes seen along the spine. Critical Value/emergent results were called by telephone at the time of interpretation on 11/16/2022 at 7:48 am to provider Vonita Moss , who verbally acknowledged these results. Review of the MIP images confirms the above findings. IMPRESSION: Segmental pulmonary emboli right upper lobe  greater than left lower lobe. Small amount of clot burden. Mildly enlarged heart with a small pericardial effusion. 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 Separate 7 mm lingular lesion. Right adrenal mass identified at the edge of the imaging field. Malignant or metastatic lesion is possible. Recommend overall further evaluation. PET-CT scan may be useful as the next step in the workup versus sampling. Tiny left pleural effusion Aortic Atherosclerosis (ICD10-I70.0) and Emphysema (ICD10-J43.9). Electronically Signed   By: Karen Kays M.D.   On: 11/16/2022 10:52   DG Chest 2 View  Result Date: 11/16/2022 CLINICAL DATA:  chest pain EXAM: CHEST - 2 VIEW COMPARISON:  None Available. FINDINGS: No pleural effusion. No pneumothorax. No focal airspace opacity. Normal cardiac and mediastinal contours. No radiographically apparent displaced rib fractures. Visualized upper abdomen is unremarkable.  Vertebral body heights are maintained. IMPRESSION: No focal airspace opacity. Electronically Signed   By: Lorenza Cambridge M.D.   On: 11/16/2022 09:21    Labs:  CBC: Recent Labs    11/20/22 0413 11/21/22 0347 11/22/22 0351 11/23/22 0417  WBC 11.1* 11.5* 11.2* 12.6*  HGB 9.4* 9.9* 10.2* 10.0*  HCT 29.0* 31.3* 30.6* 30.8*  PLT 363 380 413* 431*    COAGS: Recent Labs    11/18/22 0357  INR 1.3*    BMP: Recent Labs    11/18/22 0357 11/21/22 0347 11/22/22 0351 11/23/22 0417  NA 135 136 137 136  K 3.7 4.1 3.8 3.9  CL 99 104 103 102  CO2 24 22 24 25   GLUCOSE 97 103* 110* 102*  BUN 16 12 12 12   CALCIUM 8.6* 8.6* 8.9 9.1  CREATININE 1.04* 0.91 0.97 0.95  GFRNONAA 57* >60 >60 >60    LIVER FUNCTION TESTS: No results for input(s): "BILITOT", "AST", "ALT", "ALKPHOS", "PROT", "ALBUMIN" in the last 8760 hours.  TUMOR MARKERS: No results for input(s): "AFPTM", "CEA", "CA199", "CHROMGRNA" in the last 8760 hours.  Assessment and Plan: 72 y.o. female  with past medical history of arthritis, DDD, hypertension, tobacco abuse admitted to [facility] on 6/12 with abdominal pain/cough/productive sputum.  CT angiogram chest showed segmental pulmonary emboli right upper lobe greater than left upper lobe along with spiculated left upper lobe mass measuring 2.5 cm ,5.2 cm right adrenal mass and small pericardial effusion.  Echo showed ejection fraction of 30 to 35% with global hypokinesis.  Lower extremity duplex ultrasound was negative for DVT.  She was started on IV heparin.  No prior history of malignancy.  Request now received from oncology for right adrenal mass biopsy for further evaluation.  Plasma metanephrines were within normal range.  Imaging studies have been reviewed by Dr. Milford Cage. Risks and benefits of procedure was discussed with the patient/daughter  including, but not limited to bleeding, infection, damage to adjacent structures or low yield requiring additional tests.  WBC today 12.6, hemoglobin 10, platelets 431K, creatinine normal, PT 16.5, INR 1.3.  Temp 99, BP okay.  All of the questions were answered and there is agreement to proceed.  Consent signed and in chart.  Procedure tentatively scheduled for later this afternoon.  IV heparin has been held.    Thank you for this interesting consult.  I greatly enjoyed meeting Erin Pacheco and look forward to participating in their care.  A copy of this report was sent to the requesting provider on this date.  Electronically Signed: D. Jeananne Rama, [name] 11/23/2022, 9:35 AM   I spent a total of 25 minutes  in face to face in clinical consultation, greater than 50% of which was counseling/coordinating care for CT guided right adrenal mass biopsy

## 2022-11-23 NOTE — Progress Notes (Signed)
[facility]  PROGRESS NOTE  Erin Pacheco ZOX:[id number] DOB: [birth year] DOA: 11/16/2022 PCP: Renaye Rakers, MD   Brief HPI:   72 year old female with history of hypertension, tobacco abuse presents with abdominal pain, cough, productive sputum.  On presentation D-dimer was 3.14, CT chest showed segmental pulmonary emboli, right upper lobe greater than left upper lobe with spiculated left upper lobe mass measuring 2.5 cm and right adrenal mass.  Echocardiogram showed EF 30 to 35% with global hypokinesis.  Patient started on heparin drip and transferred to [facility] per family request.  Pulmonary/IR/cardiology/oncology were consulted.  I recommended to rule out pheochromocytoma before proceeding with biopsy.  Plasma metanephrine have been normal.    Assessment/Plan:   Acute pulmonary embolism -Likely due to underlying regnancy -Venous duplex of lower extremity was negative for DVT -Started on IV heparin, continue heparin drip -Currently not requiring oxygen -Echocardiogram showed EF 30 to 35%  Left lung and right malignancy -Seen on CTA chest; IR consulted for continued biopsy -Recommended to rule out pheochromocytoma; last metanephrine normal -Plan for adrenal mass biopsy today  Left ventricle dysfunction -Seen on echocardiogram, EF 30 to 35% -Started on ARB, Toprol XL, BNP 54.1 -Continue Aldactone -Cardiology following  Acute kidney injury versus CKD -Creatinine was 1.26 on presentation, no previous renal function lab available -Creatinine improved to 0.95  Possible COPD Tobacco abuse -Pulmonary following intermittently: Recommended to contact PCCM if additional biopsy is nondiagnostic or if results are not back prior to discharge and they can arrange outpatient workup of lung mass.  Continue current inhaled regimen.  Will need outpatient PFTs -Patient was counseled regarding tobacco cessation by admitting hospitalist -Continue nicotine patch    Hypertension -Monitor blood pressure.   Continue metoprolol, spironolactone and losartan as per cardiology.   Amlodipine has been discontinued.     Normocytic anemia -Possibly anemia of chronic disease.   Hemoglobin stable.   No signs of bleeding.   Leukocytosis -Possibly reactive.      Medications     fluticasone furoate-vilanterol  1 puff Inhalation Daily   lidocaine  1 patch Transdermal Q24H   losartan  25 mg Oral Daily   metoprolol succinate  50 mg Oral Daily   nicotine  21 mg Transdermal Daily   sodium chloride flush  3 mL Intravenous Q12H   sodium chloride flush  3 mL Intravenous Q12H   spironolactone  12.5 mg Oral Daily   umeclidinium bromide  1 puff Inhalation Daily     Data Reviewed:   CBG:  No results for input(s): "GLUCAP" in the last 168 hours.  SpO2: 94 %    Vitals:   11/23/22 0449 11/23/22 0454 11/23/22 0900 11/23/22 1131  BP: (!) 159/67  137/66 139/73  Pulse: 75  75 70  Resp: 18  18 18   Temp: 99 F (37.2 C)  98.3 F (36.8 C) 98.8 F (37.1 C)  TempSrc: Oral  Oral Oral  SpO2: 95%  94% 94%  Weight:  61.1 kg    Height:          Data Reviewed:  Basic Metabolic Panel: Recent Labs  Lab 11/18/22 0357 11/21/22 0347 11/22/22 0351 11/23/22 0417  NA 135 136 137 136  K 3.7 4.1 3.8 3.9  CL 99 104 103 102  CO2 24 22 24 25   GLUCOSE 97 103* 110* 102*  BUN 16 12 12 12   CREATININE 1.04* 0.91 0.97 0.95  CALCIUM 8.6* 8.6* 8.9 9.1  MG 2.3 2.1 2.0 2.0  CBC: Recent Labs  Lab 11/19/22 0438 11/20/22 0413 11/21/22 0347 11/22/22 0351 11/23/22 0417  WBC 11.8* 11.1* 11.5* 11.2* 12.6*  HGB 9.9* 9.4* 9.9* 10.2* 10.0*  HCT 29.8* 29.0* 31.3* 30.6* 30.8*  MCV 87.6 89.5 90.2 88.4 88.0  PLT 356 363 380 413* 431*    LFT No results for input(s): "AST", "ALT", "ALKPHOS", "BILITOT", "PROT", "ALBUMIN" in the last 168 hours.   Antibiotics: Anti-infectives (From admission, onward)    None        DVT prophylaxis: Heparin  Code Status: Full  code  Family Communication:    CONSULTS    Subjective   Patient seen and examined, denies shortness of breath.  Plan for continued biopsy today.   Objective    Physical Examination:   General-appears in no acute distress Heart-S1-S2, regular, no murmur auscultated Lungs-clear to auscultation bilaterally, no wheezing or crackles auscultated Abdomen-soft, nontender, no organomegaly Extremities-no edema in the lower extremities Neuro-alert, oriented x3, no focal deficit noted  Status is: Inpatient:          [name]   [facility] If 7PM-7AM, please contact night-coverage at [email or web address], Office  [phone]   11/23/2022, 3:10 PM  LOS: 7 days

## 2022-11-23 NOTE — Progress Notes (Signed)
ANTICOAGULATION CONSULT NOTE - Follow Up Consult  Pharmacy Consult for Heparin Indication: pulmonary embolus  No Known Allergies  Patient Measurements: Height: 5\' 4"  (162.6 cm) Weight: 61.1 kg (134 lb 11.2 oz) IBW/kg (Calculated) : 54.7 Heparin Dosing Weight: 63.5kg  Vital Signs: Temp: 98.8 F (37.1 C) (06/19 1131) Temp Source: Oral (06/19 1131) BP: 140/66 (06/19 1603) Pulse Rate: 74 (06/19 1603)  Labs: Recent Labs    11/21/22 0347 11/21/22 0512 11/22/22 0351 11/23/22 0417  HGB 9.9*  --  10.2* 10.0*  HCT 31.3*  --  30.6* 30.8*  PLT 380  --  413* 431*  HEPARINUNFRC  --  0.63 0.54 0.59  CREATININE 0.91  --  0.97 0.95     Estimated Creatinine Clearance: 46.9 mL/min (by C-G formula based on SCr of 0.95 mg/dL).   Assessment: Patient present to ED with left sides chest discomfort and abdominal pain. CTA shows segmental pulmonary emboli right upper lobe greater than left lower lobe. Small amount of clot burden. No history of oral anticoagulants.   Today, 11/23/2022 -Heparin paused 0930 for biopsy per IR -Previously therapeutic at 1700 units/hr, no bleeding or line issues previously noted -S/p biopsy, okay to resume heparin ~ 1900 (2 hrs from notification by IR) pending pt stability  Goal of Therapy:  Heparin level 0.3-0.7 units/ml Monitor platelets by anticoagulation protocol: Yes   Plan:  Resume heparin infusion at 1700 units/hr around 1900 as long as patient remains stable post procedure Daily Heparin Level and CBC Monitor for signs/symptoms of bleeding Follow plans for long term anticoagulation once invasive procedures done   Pricilla Riffle, PharmD, BCPS Clinical Pharmacist 11/23/2022 4:42 PM

## 2022-11-23 NOTE — Procedures (Signed)
Interventional Radiology Procedure Note  Procedure: CT guided right adrenal mass biopsy   Findings: Please refer to procedural dictation for full description. 18 ga core x2.  Gelfoam slurry needle track embolization.  Complications: None immediate  Estimated Blood Loss: < 5 mL  Recommendations: Strict 3 hour bedrest. Follow up Pathology results./   Marliss Coots, MD

## 2022-11-23 NOTE — Progress Notes (Signed)
ANTICOAGULATION CONSULT NOTE - Follow Up Consult  Pharmacy Consult for Heparin Indication: pulmonary embolus  No Known Allergies  Patient Measurements: Height: 5\' 4"  (162.6 cm) Weight: 61.1 kg (134 lb 11.2 oz) IBW/kg (Calculated) : 54.7 Heparin Dosing Weight: 63.5kg  Vital Signs: Temp: 99 F (37.2 C) (06/19 0449) Temp Source: Oral (06/19 0449) BP: 159/67 (06/19 0449) Pulse Rate: 75 (06/19 0449)  Labs: Recent Labs    11/21/22 0347 11/21/22 0512 11/22/22 0351 11/23/22 0417  HGB 9.9*  --  10.2* 10.0*  HCT 31.3*  --  30.6* 30.8*  PLT 380  --  413* 431*  HEPARINUNFRC  --  0.63 0.54 0.59  CREATININE 0.91  --  0.97 0.95     Estimated Creatinine Clearance: 46.9 mL/min (by C-G formula based on SCr of 0.95 mg/dL).   Assessment: Patient present to ED with left sides chest discomfort and abdominal pain. CTA shows segmental pulmonary emboli right upper lobe greater than left lower lobe. Small amount of clot burden. No history of oral anticoagulants.   Today, 11/23/2022 Heparin Level= 0.59, remains therapeutic on IV heparin at 1700 units/hr Hgb 10 g/dL - low, but stable Plt 161 K/uL, slightly elevated No bleeding or line issues noted per RN  Goal of Therapy:  Heparin level 0.3-0.7 units/ml Monitor platelets by anticoagulation protocol: Yes   Plan:  Continue heparin infusion at 1700 units/hr Daily Heparin Level and CBC Continue to monitor H&H and platelets Monitor for signs/symptoms of bleeding Follow plans for long term anticoagulation once invasive procedures done   Tacy Learn, PharmD Clinical Pharmacist 11/23/2022 7:24 AM

## 2022-11-24 DIAGNOSIS — I420 Dilated cardiomyopathy: Secondary | ICD-10-CM | POA: Diagnosis not present

## 2022-11-24 DIAGNOSIS — Z7901 Long term (current) use of anticoagulants: Secondary | ICD-10-CM | POA: Diagnosis not present

## 2022-11-24 DIAGNOSIS — D500 Iron deficiency anemia secondary to blood loss (chronic): Secondary | ICD-10-CM | POA: Diagnosis not present

## 2022-11-24 DIAGNOSIS — I2694 Multiple subsegmental pulmonary emboli without acute cor pulmonale: Secondary | ICD-10-CM | POA: Diagnosis not present

## 2022-11-24 DIAGNOSIS — Z5181 Encounter for therapeutic drug level monitoring: Secondary | ICD-10-CM | POA: Diagnosis not present

## 2022-11-24 DIAGNOSIS — R918 Other nonspecific abnormal finding of lung field: Secondary | ICD-10-CM | POA: Diagnosis not present

## 2022-11-24 LAB — CBC
HCT: 29 % — ABNORMAL LOW (ref 36.0–46.0)
Hemoglobin: 9.5 g/dL — ABNORMAL LOW (ref 12.0–15.0)
MCH: 29.1 pg (ref 26.0–34.0)
MCHC: 32.8 g/dL (ref 30.0–36.0)
MCV: 88.7 fL (ref 80.0–100.0)
Platelets: 418 10*3/uL — ABNORMAL HIGH (ref 150–400)
RBC: 3.27 MIL/uL — ABNORMAL LOW (ref 3.87–5.11)
RDW: 15.5 % (ref 11.5–15.5)
WBC: 12.3 10*3/uL — ABNORMAL HIGH (ref 4.0–10.5)
nRBC: 0 % (ref 0.0–0.2)

## 2022-11-24 LAB — HEPARIN LEVEL (UNFRACTIONATED): Heparin Unfractionated: 0.42 IU/mL (ref 0.30–0.70)

## 2022-11-24 NOTE — Progress Notes (Signed)
ANTICOAGULATION CONSULT NOTE - Follow Up Consult  Pharmacy Consult for Heparin Indication: pulmonary embolus  No Known Allergies  Patient Measurements: Height: 5\' 4"  (162.6 cm) Weight: 61 kg (134 lb 7.7 oz) IBW/kg (Calculated) : 54.7 Heparin Dosing Weight: 63.5kg  Vital Signs: Temp: 99.5 F (37.5 C) (06/20 0431) Temp Source: Oral (06/20 0431) BP: 130/64 (06/20 0431) Pulse Rate: 78 (06/20 0431)  Labs: Recent Labs    11/22/22 0351 11/23/22 0417 11/24/22 0418  HGB 10.2* 10.0* 9.5*  HCT 30.6* 30.8* 29.0*  PLT 413* 431* 418*  HEPARINUNFRC 0.54 0.59 0.42  CREATININE 0.97 0.95  --      Estimated Creatinine Clearance: 46.9 mL/min (by C-G formula based on SCr of 0.95 mg/dL).   Assessment: Patient present to ED with left sides chest discomfort and abdominal pain. CTA shows segmental pulmonary emboli right upper lobe greater than left lower lobe. Small amount of clot burden. No history of oral anticoagulants.   6/19, underwent CT guided right adrenal mass biopsy.  In preparation for biopsy, heparin was held 6/19 at 0930.  Post biopsy, heparin was resumed 6/19 at 1919.   Today, 11/24/2022 Heparin Level= 0.42, therapeutic on IV heparin at 1700 units/hr Hgb 9.5 g/dL - low, but stable Plt 161 K/uL- slightly elevated No bleeding or line issues noted per RN  Goal of Therapy:  Heparin level 0.3-0.7 units/ml Monitor platelets by anticoagulation protocol: Yes   Plan:  Continue heparin infusion at 1700 units/hr Daily Heparin Level and CBC Monitor for signs/symptoms of bleeding Follow plans for long term anticoagulation once invasive procedures done   Tacy Learn, PharmD Clinical Pharmacist 11/24/2022 7:22 AM

## 2022-11-24 NOTE — Progress Notes (Signed)
[facility]  PROGRESS NOTE  Erin Pacheco:[id number] DOB: 05-Oct-1950 DOA: 11/16/2022 PCP: Renaye Rakers, MD   Brief HPI:   72 year old female with history of hypertension, tobacco abuse presents with abdominal pain, cough, productive sputum.  On presentation D-dimer was 3.14, CT chest showed segmental pulmonary emboli, right upper lobe greater than left upper lobe with spiculated left upper lobe mass measuring 2.5 cm and right adrenal mass.  Echocardiogram showed EF 30 to 35% with global hypokinesis.  Patient started on heparin drip and transferred to [facility] per family request.  Pulmonary/IR/cardiology/oncology were consulted.  I recommended to rule out pheochromocytoma before proceeding with biopsy.  Plasma metanephrine have been normal.    Assessment/Plan:   Acute pulmonary embolism -Likely due to underlying regnancy -Venous duplex of lower extremity was negative for DVT -Started on IV heparin, continue heparin drip -Heparin drip was restarted last night after biopsy -Currently not requiring oxygen -Echocardiogram showed EF 30 to 35%  Left lung and right renal malignancy -Seen on CTA chest; IR consulted for continued biopsy -Recommended to rule out pheochromocytoma; last metanephrine normal Underwent right adrenal biopsy yesterday, result pending  Left ventricle dysfunction -Seen on echocardiogram, EF 30 to 35% -Started on ARB, Toprol XL, BNP 54.1 -Continue Aldactone -Cardiology has signed off  Acute kidney injury versus CKD -Creatinine was 1.26 on presentation, no previous renal function lab available -Creatinine improved to 0.95  COPD Tobacco abuse -Pulmonary following intermittently: Recommended to contact PCCM if additional biopsy is nondiagnostic or if results are not back prior to discharge and they can arrange outpatient workup of lung mass.  Continue current inhaled regimen.  Will need outpatient PFTs -Patient was counseled regarding tobacco  cessation by admitting hospitalist -Continue nicotine patch   Hypertension -Monitor blood pressure.   Continue metoprolol, spironolactone and losartan as per cardiology.   Amlodipine has been discontinued.     Normocytic anemia -Likely anemia of chronic disease.   Hemoglobin stable.   No signs of bleeding.   Leukocytosis -Likely reactive.      Medications     fluticasone furoate-vilanterol  1 puff Inhalation Daily   lidocaine  1 patch Transdermal Q24H   losartan  25 mg Oral Daily   metoprolol succinate  50 mg Oral Daily   nicotine  21 mg Transdermal Daily   sodium chloride flush  3 mL Intravenous Q12H   sodium chloride flush  3 mL Intravenous Q12H   spironolactone  12.5 mg Oral Daily   umeclidinium bromide  1 puff Inhalation Daily     Data Reviewed:   CBG:  No results for input(s): "GLUCAP" in the last 168 hours.  SpO2: 94 % O2 Flow Rate (L/min): 2 L/min    Vitals:   11/23/22 2043 11/24/22 0431 11/24/22 0500 11/24/22 0800  BP: 129/64 130/64    Pulse: 69 78    Resp: 18 18    Temp: 98.7 F (37.1 C) 99.5 F (37.5 C)    TempSrc: Oral Oral    SpO2: 94% 94%  94%  Weight:   61 kg   Height:          Data Reviewed:  Basic Metabolic Panel: Recent Labs  Lab 11/18/22 0357 11/21/22 0347 11/22/22 0351 11/23/22 0417  NA 135 136 137 136  K 3.7 4.1 3.8 3.9  CL 99 104 103 102  CO2 24 22 24 25   GLUCOSE 97 103* 110* 102*  BUN 16 12 12 12   CREATININE 1.04* 0.91 0.97 0.95  CALCIUM  8.6* 8.6* 8.9 9.1  MG 2.3 2.1 2.0 2.0    CBC: Recent Labs  Lab 11/20/22 0413 11/21/22 0347 11/22/22 0351 11/23/22 0417 11/24/22 0418  WBC 11.1* 11.5* 11.2* 12.6* 12.3*  HGB 9.4* 9.9* 10.2* 10.0* 9.5*  HCT 29.0* 31.3* 30.6* 30.8* 29.0*  MCV 89.5 90.2 88.4 88.0 88.7  PLT 363 380 413* 431* 418*    LFT No results for input(s): "AST", "ALT", "ALKPHOS", "BILITOT", "PROT", "ALBUMIN" in the last 168 hours.   Antibiotics: Anti-infectives (From admission, onward)    None         DVT prophylaxis: Heparin  Code Status: Full code  Family Communication:    CONSULTS    Subjective   Complains of pain at biopsy site.  Denies shortness of breath.  Objective    Physical Examination:  General-appears in no acute distress Heart-S1-S2, regular, no murmur auscultated Lungs-clear to auscultation bilaterally, no wheezing or crackles auscultated Abdomen-soft, nontender, no organomegaly Extremities-no edema in the lower extremities Neuro-alert, oriented x3, no focal deficit noted   Status is: Inpatient:          [name]   [facility] If 7PM-7AM, please contact night-coverage at [email or web address], Office  [phone]   11/24/2022, 8:16 AM  LOS: 8 days

## 2022-11-24 NOTE — Progress Notes (Signed)
Rounding Note    Patient Name: TORILYNN SHIPPEE Date of Encounter: 11/24/2022  [facility] Cardiologist: Rollene Rotunda, MD   Subjective   A bit sore from biopsy  Inpatient Medications    Scheduled Meds:  fluticasone furoate-vilanterol  1 puff Inhalation Daily   lidocaine  1 patch Transdermal Q24H   losartan  25 mg Oral Daily   metoprolol succinate  50 mg Oral Daily   nicotine  21 mg Transdermal Daily   sodium chloride flush  3 mL Intravenous Q12H   sodium chloride flush  3 mL Intravenous Q12H   spironolactone  12.5 mg Oral Daily   umeclidinium bromide  1 puff Inhalation Daily   Continuous Infusions:  sodium chloride     heparin 1,700 Units/hr (11/23/22 1919)   PRN Meds: sodium chloride, acetaminophen **OR** acetaminophen, albuterol, ALPRAZolam, bisacodyl, fentaNYL (SUBLIMAZE) injection, guaiFENesin, hydrALAZINE, ondansetron **OR** ondansetron (ZOFRAN) IV, oxyCODONE, polyethylene glycol, polyvinyl alcohol, sodium chloride flush, traZODone   Vital Signs    Vitals:   11/23/22 2043 11/24/22 0431 11/24/22 0500 11/24/22 0800  BP: 129/64 130/64    Pulse: 69 78    Resp: 18 18    Temp: 98.7 F (37.1 C) 99.5 F (37.5 C)    TempSrc: Oral Oral    SpO2: 94% 94%  94%  Weight:   61 kg   Height:        Intake/Output Summary (Last 24 hours) at 11/24/2022 1012 Last data filed at 11/24/2022 0825 Gross per 24 hour  Intake 897.52 ml  Output 800 ml  Net 97.52 ml      11/24/2022    5:00 AM 11/23/2022    4:54 AM 11/22/2022    5:00 AM  Last 3 Weights  Weight (lbs) 134 lb 7.7 oz 134 lb 11.2 oz 134 lb 9.6 oz  Weight (kg) 61 kg 61.1 kg 61.054 kg      Telemetry    none - Personally Reviewed  ECG    No new - Personally Reviewed  Physical Exam   GEN: No acute distress.   Neck: No JVD sitting up in chair Cardiac: RRR, no murmurs, rubs, or gallops.  Respiratory: Clear to auscultation bilaterally. GI: Soft, nontender, non-distended  MS: No edema; No  deformity. Neuro:  Nonfocal  Psych: Normal affect   Labs    High Sensitivity Troponin:   Recent Labs  Lab 11/16/22 0849 11/16/22 1238  TROPONINIHS 9 11     Chemistry Recent Labs  Lab 11/21/22 0347 11/22/22 0351 11/23/22 0417  NA 136 137 136  K 4.1 3.8 3.9  CL 104 103 102  CO2 22 24 25   GLUCOSE 103* 110* 102*  BUN 12 12 12   CREATININE 0.91 0.97 0.95  CALCIUM 8.6* 8.9 9.1  MG 2.1 2.0 2.0  GFRNONAA >60 >60 >60  ANIONGAP 10 10 9     Lipids No results for input(s): "CHOL", "TRIG", "HDL", "LABVLDL", "LDLCALC", "CHOLHDL" in the last 168 hours.  Hematology Recent Labs  Lab 11/22/22 0351 11/23/22 0417 11/24/22 0418  WBC 11.2* 12.6* 12.3*  RBC 3.46* 3.50* 3.27*  HGB 10.2* 10.0* 9.5*  HCT 30.6* 30.8* 29.0*  MCV 88.4 88.0 88.7  MCH 29.5 28.6 29.1  MCHC 33.3 32.5 32.8  RDW 15.2 15.4 15.5  PLT 413* 431* 418*   Thyroid No results for input(s): "TSH", "FREET4" in the last 168 hours.  BNP Recent Labs  Lab 11/20/22 0413  BNP 54.1    DDimer  No results for input(s): "DDIMER" in  the last 168 hours.    Radiology    CT BIOPSY  Result Date: 11/23/2022 INDICATION: 72 year old female with history of indeterminate right adrenal mass, history of stage IV lung cancer. EXAM: CT BIOPSY COMPARISON:  None Available. MEDICATIONS: None. ANESTHESIA/SEDATION: Fentanyl 100 mcg IV; Versed 2 mg IV Sedation time: 10 minutes; The patient was continuously monitored during the procedure by the interventional radiology nurse under my direct supervision. CONTRAST:  None. COMPLICATIONS: None immediate. PROCEDURE: RADIATION DOSE REDUCTION: This exam was performed according to the departmental dose-optimization program which includes automated exposure control, adjustment of the mA and/or kV according to patient size and/or use of iterative reconstruction technique. Informed consent was obtained from the patient following an explanation of the procedure, risks, benefits and alternatives. A time out was  performed prior to the initiation of the procedure. The patient was positioned in the right lateral decubitus position on the CT table and a limited CT was performed for procedural planning demonstrating similar appearing right adrenal mass. The procedure was planned. The operative site was prepped and draped in the usual sterile fashion. Appropriate trajectory was confirmed with a 22 gauge spinal needle after the adjacent tissues were anesthetized with 1% Lidocaine with epinephrine. Under intermittent CT guidance, a 17 gauge coaxial needle was advanced into the peripheral aspect of the mass. Appropriate positioning was confirmed and total of 2 samples were obtained with an 18 gauge core needle biopsy device. The co-axial needle was removed while administering Gel-Foam slurry along the needle track and superficial hemostasis was achieved with manual compression. A limited postprocedural CT was negative for hemorrhage or additional complication. A dressing was placed. The patient tolerated the procedure well without immediate postprocedural complication. IMPRESSION: Technically successful CT guided core needle biopsy of right adrenal mass. Marliss Coots, MD [facility] Radiology Electronically Signed   By: Marliss Coots M.D.   On: 11/23/2022 16:51   ECHOCARDIOGRAM COMPLETE  Result Date: 11/16/2022    ECHOCARDIOGRAM REPORT   Patient Name:   TENAJ HARNETT Date of Exam: 11/16/2022 Medical Rec #:  [id number]        Height:       64.0 in Accession #:    [id number]       Weight:       140.0 lb Date of Birth:  1950/08/28        BSA:          1.681 m Patient Age:    71 years         BP:           158/115 mmHg Patient Gender: F                HR:           78 bpm. [facility] Procedure: 2D Echo, Cardiac Doppler and Color Doppler Indications:    Pulmonary Embolus I26.09  History:        Patient has prior history of Echocardiogram examinations, most                  recent 11/16/2001. Signs/Symptoms:Murmur; Risk                 Factors:Hypertension.  Sonographer:    Aron Baba Referring Phys: [id number] COURAGE EMOKPAE IMPRESSIONS  1. Left ventricular ejection fraction, by estimation, is 30 to 35%. The left ventricle has moderately decreased function. The left ventricle demonstrates global hypokinesis. Left ventricular diastolic parameters are indeterminate.  2.  Right ventricular systolic function is normal. The right ventricular size is normal.  3. Left atrial size was mildly dilated.  4. A small pericardial effusion is present. The pericardial effusion is circumferential.  5. The mitral valve is normal in structure. No evidence of mitral valve regurgitation. No evidence of mitral stenosis.  6. The aortic valve was not well visualized. Aortic valve regurgitation is mild to moderate. FINDINGS  Left Ventricle: Left ventricular ejection fraction, by estimation, is 30 to 35%. The left ventricle has moderately decreased function. The left ventricle demonstrates global hypokinesis. Definity contrast agent was given IV to delineate the left ventricular endocardial borders. The left ventricular internal cavity size was normal in size. There is no left ventricular hypertrophy. Left ventricular diastolic parameters are indeterminate. Right Ventricle: The right ventricular size is normal. Right vetricular wall thickness was not well visualized. Right ventricular systolic function is normal. Left Atrium: Left atrial size was mildly dilated. Right Atrium: Right atrial size was normal in size. Pericardium: A small pericardial effusion is present. The pericardial effusion is circumferential. Mitral Valve: The mitral valve is normal in structure. No evidence of mitral valve regurgitation. No evidence of mitral valve stenosis. Tricuspid Valve: The tricuspid valve is normal in structure. Tricuspid valve regurgitation is mild . No evidence of tricuspid stenosis. Aortic Valve: The aortic valve  was not well visualized. Aortic valve regurgitation is mild to moderate. Aortic regurgitation PHT measures 442 msec. Aortic valve mean gradient measures 8.0 mmHg. Aortic valve peak gradient measures 16.6 mmHg. Aortic valve area, by VTI measures 1.49 cm. Pulmonic Valve: The pulmonic valve was not well visualized. Pulmonic valve regurgitation is not visualized. No evidence of pulmonic stenosis. Aorta: The aortic root is normal in size and structure. IAS/Shunts: The interatrial septum was not well visualized.  LEFT VENTRICLE PLAX 2D LVIDd:         4.45 cm   Diastology LVIDs:         4.00 cm   LV e' medial:    4.99 cm/s LV PW:         1.20 cm   LV E/e' medial:  38.5 LV IVS:        0.90 cm   LV e' lateral:   5.43 cm/s LVOT diam:     2.00 cm   LV E/e' lateral: 35.4 LV SV:         53 LV SV Index:   32 LVOT Area:     3.14 cm  RIGHT VENTRICLE RV S prime:     13.10 cm/s TAPSE (M-mode): 1.6 cm LEFT ATRIUM           Index        RIGHT ATRIUM           Index LA diam:      2.70 cm 1.61 cm/m   RA Area:     14.00 cm LA Vol (A2C): 36.5 ml 21.71 ml/m  RA Volume:   34.70 ml  20.64 ml/m LA Vol (A4C): 58.3 ml 34.68 ml/m  AORTIC VALVE                     PULMONIC VALVE AV Area (Vmax):    1.57 cm      PR End Diast Vel: 5.20 msec AV Area (Vmean):   1.47 cm AV Area (VTI):     1.49 cm AV Vmax:           204.00 cm/s AV Vmean:  128.000 cm/s AV VTI:            0.357 m AV Peak Grad:      16.6 mmHg AV Mean Grad:      8.0 mmHg LVOT Vmax:         102.19 cm/s LVOT Vmean:        59.811 cm/s LVOT VTI:          0.170 m LVOT/AV VTI ratio: 0.48 AI PHT:            442 msec  AORTA Ao Root diam: 3.00 cm Ao Asc diam:  3.10 cm MITRAL VALVE                TRICUSPID VALVE MV Area (PHT): 9.25 cm     TR Peak grad:   23.8 mmHg MV Decel Time: 82 msec      TR Vmax:        244.00 cm/s MR Peak grad: 7.7 mmHg MR Vmax:      139.00 cm/s   SHUNTS MV E velocity: 192.00 cm/s  Systemic VTI:  0.17 m MV A velocity: 48.40 cm/s   Systemic Diam: 2.00 cm MV E/A  ratio:  3.97 Dina Rich MD Electronically signed by Dina Rich MD Signature Date/Time: 11/16/2022/3:04:20 PM    Final     Cardiac Studies   Echo 11/16/2022  1. Left ventricular ejection fraction, by estimation, is 30 to 35%. The  left ventricle has moderately decreased function. The left ventricle  demonstrates global hypokinesis. Left ventricular diastolic parameters are  indeterminate.   2. Right ventricular systolic function is normal. The right ventricular  size is normal.   3. Left atrial size was mildly dilated.   4. A small pericardial effusion is present. The pericardial effusion is  circumferential.   5. The mitral valve is normal in structure. No evidence of mitral valve  regurgitation. No evidence of mitral stenosis.   6. The aortic valve was not well visualized. Aortic valve regurgitation  is mild to moderate.   Patient Profile     72 y.o. female  PMH of tobacco abuse and HTN presented with chest pain and persistent cough and found to have PE, LUL lung mass and R adrenal mass on CTA of chest now with decreased EF 30-35%.   Assessment & Plan    New LV dysfunction: EF 30 to 35%.  Last echocardiogram in 2003 showed normal EF.  She is not grossly volume overloaded Currently on ARB and Toprol BNP is 54.1  Tolerating aldactone stable    Acute pulmonary embolism: Does not appear that heparin resumed after biopsy Needs to start DOAC for PE dose today if no further plans to do procedures post biopsy   Left upper lobe lung mass and right adrenal mass: Per primary team and pulmonology service. Oncology on board, Pathology from adrenal biopsy pending to see if metastatic dx or not    4.  Tobacco use.  Possible COPD per IM   5. Anemia with Hgb 9.5 today      Cardiology will sign off Will arrange outpatient f/u with Dr Antoine Poche  For questions or updates, please contact [facility] Please consult [email or web address] for contact info under        Signed, Charlton Haws, MD  11/24/2022, 10:12 AM

## 2022-11-24 NOTE — Plan of Care (Signed)

## 2022-11-25 ENCOUNTER — Telehealth: Payer: Self-pay | Admitting: Pulmonary Disease

## 2022-11-25 DIAGNOSIS — R918 Other nonspecific abnormal finding of lung field: Secondary | ICD-10-CM | POA: Diagnosis not present

## 2022-11-25 DIAGNOSIS — I2694 Multiple subsegmental pulmonary emboli without acute cor pulmonale: Secondary | ICD-10-CM | POA: Diagnosis not present

## 2022-11-25 DIAGNOSIS — D500 Iron deficiency anemia secondary to blood loss (chronic): Secondary | ICD-10-CM | POA: Diagnosis not present

## 2022-11-25 LAB — CBC
HCT: 28.5 % — ABNORMAL LOW (ref 36.0–46.0)
Hemoglobin: 9.5 g/dL — ABNORMAL LOW (ref 12.0–15.0)
MCH: 29.5 pg (ref 26.0–34.0)
MCHC: 33.3 g/dL (ref 30.0–36.0)
MCV: 88.5 fL (ref 80.0–100.0)
Platelets: 436 10*3/uL — ABNORMAL HIGH (ref 150–400)
RBC: 3.22 MIL/uL — ABNORMAL LOW (ref 3.87–5.11)
RDW: 15.7 % — ABNORMAL HIGH (ref 11.5–15.5)
WBC: 12.6 10*3/uL — ABNORMAL HIGH (ref 4.0–10.5)
nRBC: 0 % (ref 0.0–0.2)

## 2022-11-25 LAB — HEPARIN LEVEL (UNFRACTIONATED): Heparin Unfractionated: 0.4 IU/mL (ref 0.30–0.70)

## 2022-11-25 LAB — OCCULT BLOOD X 1 CARD TO LAB, STOOL: Fecal Occult Bld: NEGATIVE

## 2022-11-25 LAB — METANEPHRINES, URINE, 24 HOUR
Metaneph Total, Ur: 219 ug/L
Metanephrines, 24H Ur: 153 ug/24 hr (ref 36–209)
Normetanephrine, 24H Ur: 408 ug/24 hr (ref 131–612)
Normetanephrine, Ur: 583 ug/L
Total Volume: 700

## 2022-11-25 MED ORDER — APIXABAN 5 MG PO TABS: 5.0000 mg | ORAL_TABLET | Freq: Two times a day (BID) | ORAL | Status: DC

## 2022-11-25 MED ORDER — APIXABAN 5 MG PO TABS: 10.0000 mg | ORAL_TABLET | Freq: Two times a day (BID) | ORAL | Status: DC

## 2022-11-25 MED ORDER — APIXABAN 5 MG PO TABS
5.0000 mg | ORAL_TABLET | Freq: Two times a day (BID) | ORAL | Status: DC
  Administered 2022-11-25 – 2022-11-26 (×3): 5 mg via ORAL
  Filled 2022-11-25 (×3): qty 1

## 2022-11-25 NOTE — Progress Notes (Signed)
[facility]  PROGRESS NOTE  Erin Pacheco YHC:[id number] DOB: Dec 28, 1950 DOA: 11/16/2022 PCP: Erin Rakers, MD   Brief HPI:   72 year old female with history of hypertension, tobacco abuse presents with abdominal pain, cough, productive sputum.  On presentation D-dimer was 3.14, CT chest showed segmental pulmonary emboli, right upper lobe greater than left upper lobe with spiculated left upper lobe mass measuring 2.5 cm and right adrenal mass.  Echocardiogram showed EF 30 to 35% with global hypokinesis.  Patient started on heparin drip and transferred to [facility] per family request.  Pulmonary/IR/cardiology/oncology were consulted.  I recommended to rule out pheochromocytoma before proceeding with biopsy.  Plasma metanephrine have been normal.    Assessment/Plan:   Acute pulmonary embolism -Likely due to underlying regnancy -Venous duplex of lower extremity was negative for DVT -Started on IV heparin, continue heparin drip -Heparin drip was restarted  after biopsy; heparin has been changed to apixaban -Currently not requiring oxygen -Echocardiogram showed EF 30 to 35%  Left lung and right renal malignancy -Seen on CTA chest; IR consulted for continued biopsy -Recommended to rule out pheochromocytoma; last metanephrine normal Underwent right adrenal biopsy yesterday, result pending  Left ventricle dysfunction -Seen on echocardiogram, EF 30 to 35% -Started on ARB, Toprol XL, BNP 54.1 -Continue Aldactone -Cardiology has signed off  Acute kidney injury versus CKD -Creatinine was 1.26 on presentation, no previous renal function lab available -Creatinine improved to 0.95  COPD Tobacco abuse -Pulmonary following intermittently: Recommended to contact PCCM if additional biopsy is nondiagnostic or if results are not back prior to discharge and they can arrange outpatient workup of lung mass.  Continue current inhaled regimen.  Will need outpatient PFTs -Patient was  counseled regarding tobacco cessation by admitting hospitalist -Continue nicotine patch   Hypertension -Monitor blood pressure.   Continue metoprolol, spironolactone and losartan as per cardiology.   Amlodipine has been discontinued.     Normocytic anemia -Likely anemia of chronic disease.   Hemoglobin stable.   No signs of bleeding.   Leukocytosis -Likely reactive.      Medications     apixaban  5 mg Oral BID   fluticasone furoate-vilanterol  1 puff Inhalation Daily   lidocaine  1 patch Transdermal Q24H   losartan  25 mg Oral Daily   metoprolol succinate  50 mg Oral Daily   nicotine  21 mg Transdermal Daily   sodium chloride flush  3 mL Intravenous Q12H   sodium chloride flush  3 mL Intravenous Q12H   spironolactone  12.5 mg Oral Daily   umeclidinium bromide  1 puff Inhalation Daily     Data Reviewed:   CBG:  No results for input(s): "GLUCAP" in the last 168 hours.  SpO2: 94 % O2 Flow Rate (L/min): 2 L/min    Vitals:   11/25/22 0508 11/25/22 0743 11/25/22 1257 11/25/22 1337  BP: 138/71  (!) 116/57   Pulse: 70  71   Resp: 17  18   Temp: 99.4 F (37.4 C)  98.2 F (36.8 C)   TempSrc: Oral  Oral   SpO2: 93% 92% 93% 94%  Weight:      Height:          Data Reviewed:  Basic Metabolic Panel: Recent Labs  Lab 11/21/22 0347 11/22/22 0351 11/23/22 0417  NA 136 137 136  K 4.1 3.8 3.9  CL 104 103 102  CO2 22 24 25   GLUCOSE 103* 110* 102*  BUN 12 12 12   CREATININE 0.91  0.97 0.95  CALCIUM 8.6* 8.9 9.1  MG 2.1 2.0 2.0    CBC: Recent Labs  Lab 11/21/22 0347 11/22/22 0351 11/23/22 0417 11/24/22 0418 11/25/22 0409  WBC 11.5* 11.2* 12.6* 12.3* 12.6*  HGB 9.9* 10.2* 10.0* 9.5* 9.5*  HCT 31.3* 30.6* 30.8* 29.0* 28.5*  MCV 90.2 88.4 88.0 88.7 88.5  PLT 380 413* 431* 418* 436*    LFT No results for input(s): "AST", "ALT", "ALKPHOS", "BILITOT", "PROT", "ALBUMIN" in the last 168 hours.   Antibiotics: Anti-infectives (From admission, onward)     None        DVT prophylaxis: Heparin  Code Status: Full code  Family Communication:    CONSULTS    Subjective   Denies any complaints.  Objective    Physical Examination:  General-appears in no acute distress Heart-S1-S2, regular, no murmur auscultated Lungs-clear to auscultation bilaterally, no wheezing or crackles auscultated Abdomen-soft, nontender, no organomegaly Extremities-no edema in the lower extremities Neuro-alert, oriented x3, no focal deficit noted  Status is: Inpatient:          Erin Pacheco   [facility] If 7PM-7AM, please contact night-coverage at [email or web address], Office  [phone]   11/25/2022, 2:16 PM  LOS: 9 days

## 2022-11-25 NOTE — Progress Notes (Signed)
Erin Pacheco is doing okay.  The biopsy from the adrenal mass is still pending.  She is on heparin for the pulmonary embolism.  She can be switched over to an oral agent now.  I would probably get her on Eliquis.  She is planning on staying in [place] to be with her daughter for right now.  I am unsure when the pathology will be back.  She is eating okay.  There is no nausea or vomiting.  She is having no problems with bowels or bladder.  She is being followed closely by cardiology for the congestive heart failure.  I know that they will help improve her cardiac function.  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  Hopefully, there will be back sometime next week.  Her vital signs are all stable.  Temperature 99.4.  Pulse 70.  Blood pressure 138/71.  Her head neck exam shows no ocular or oral lesions.  There are no palpable cervical or supraclavicular lymph nodes.  Lungs are clear bilaterally.  Cardiac exam regular rate and rhythm.  Abdomen is soft.  Bowel sounds present.  She has no fluid wave.  There is no palpable liver or spleen tip.  Extremities shows no clubbing, cyanosis or edema.  Neurological exam is nonfocal.   We have a couple issues with Erin Pacheco.  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  She does have congestive heart failure.  This will certainly impact how we can treat this malignancy.  We really need to see what the pathology is.  She likely will need to have a PET scan as an outpatient.  Again, we can switch her over to an oral agent for her anticoagulation right now.  It would be nice if she could go home over the weekend.  Again, I am not sure when the pathology will be back on the adrenal biopsy.   Christin Bach, MD  Psalms 6:2

## 2022-11-25 NOTE — Progress Notes (Signed)
ANTICOAGULATION CONSULT NOTE - Follow Up Consult  Pharmacy Consult for Heparin >>Eliquis Indication: pulmonary embolus  No Known Allergies  Patient Measurements: Height: 5\' 4"  (162.6 cm) Weight: 64.1 kg (141 lb 5 oz) IBW/kg (Calculated) : 54.7 Heparin Dosing Weight: 63.5kg  Vital Signs: Temp: 99.4 F (37.4 C) (06/21 0508) Temp Source: Oral (06/21 0508) BP: 138/71 (06/21 0508) Pulse Rate: 70 (06/21 0508)  Labs: Recent Labs    11/23/22 0417 11/24/22 0418 11/25/22 0409  HGB 10.0* 9.5* 9.5*  HCT 30.8* 29.0* 28.5*  PLT 431* 418* 436*  HEPARINUNFRC 0.59 0.42 0.40  CREATININE 0.95  --   --      Estimated Creatinine Clearance: 46.9 mL/min (by C-G formula based on SCr of 0.95 mg/dL).   Assessment: Patient present to ED with left sides chest discomfort and abdominal pain. CTA shows segmental pulmonary emboli right upper lobe greater than left lower lobe. Small amount of clot burden. No history of oral anticoagulants.   6/19, underwent CT guided right adrenal mass biopsy.  In preparation for biopsy, heparin was held 6/19 at 0930.  Post biopsy, heparin was resumed 6/19 at 1919.  6/21- ok to transition to Eliquis per oncologist   Today, 11/25/2022 Heparin Level= 0.42, therapeutic on IV heparin at 1700 units/hr.  Has been therapeutic on heparin > 1 week.  Hgb 9.5 g/dL - low, but stable Plt 960 K/uL- slightly elevated No bleeding noted per RN  Goal of Therapy:  Heparin level 0.3-0.7 units/ml Monitor platelets by anticoagulation protocol: Yes   Plan:  Eliquis 10mg  po BID x1 week followed by Eliquis 5mg  po BID D/C heparin & heparin labs Monitor for signs/symptoms of bleeding  Junita Push, PharmD, BCPS 11/25/2022 6:49 AM

## 2022-11-25 NOTE — Telephone Encounter (Signed)
Left upper lobe 2.5 cm mass suspicious for primary lung cancer.  Underwent successful biopsy of adrenal mass.  Pathology not back.  Please arrange clinic follow-up with Dr. Delton Coombes or Dr. Tonia Brooms in the next 2 to 3 weeks to discuss need for consideration of navigational bronchoscopy if biopsy of adrenal lesion is nondiagnostic or negative for malignancy.  Of course, if the adrenal lesion gives [place] staging and diagnosis this appointment could be canceled.  Probably worth discussing in clinic her candidacy for procedures given she has a reduced EF and now PE on anticoagulation.

## 2022-11-25 NOTE — Progress Notes (Signed)
Mobility Specialist - Progress Note   11/25/22 1030  Mobility  Activity Ambulated with assistance in hallway  Level of Assistance Modified independent, requires aide device or extra time  Assistive Device Front wheel walker  Distance Ambulated (ft) 350 ft  Range of Motion/Exercises Active  Activity Response Tolerated well  Mobility Referral Yes  $Mobility charge 1 Mobility  Mobility Specialist Start Time (ACUTE ONLY) 1020  Mobility Specialist Stop Time (ACUTE ONLY) 1030  Mobility Specialist Time Calculation (min) (ACUTE ONLY) 10 min   Pt was found on recliner chair and agreeable to ambulate. Had no complaints and at EOS returned to recliner chair with all needs met.  Billey Chang Mobility Specialist

## 2022-11-25 NOTE — Progress Notes (Signed)
ANTICOAGULATION CONSULT NOTE - Follow Up Consult  Pharmacy Consult for Heparin >>Eliquis Indication: pulmonary embolus  No Known Allergies  Patient Measurements: Height: 5\' 4"  (162.6 cm) Weight: 64.1 kg (141 lb 5 oz) IBW/kg (Calculated) : 54.7 Heparin Dosing Weight: 63.5kg  Vital Signs: Temp: 99.4 F (37.4 C) (06/21 0508) Temp Source: Oral (06/21 0508) BP: 138/71 (06/21 0508) Pulse Rate: 70 (06/21 0508)  Labs: Recent Labs    11/23/22 0417 11/24/22 0418 11/25/22 0409  HGB 10.0* 9.5* 9.5*  HCT 30.8* 29.0* 28.5*  PLT 431* 418* 436*  HEPARINUNFRC 0.59 0.42 0.40  CREATININE 0.95  --   --      Estimated Creatinine Clearance: 46.9 mL/min (by C-G formula based on SCr of 0.95 mg/dL).   Assessment: Patient present to ED with left sides chest discomfort and abdominal pain. CTA shows segmental pulmonary emboli right upper lobe greater than left lower lobe. Small amount of clot burden. No history of oral anticoagulants.   6/19, underwent CT guided right adrenal mass biopsy.  In preparation for biopsy, heparin was held 6/19 at 0930.  Post biopsy, heparin was resumed 6/19 at 1919.  6/21- ok to transition to Eliquis per oncologist. Since been on therapeutic heparin for > 1 week, will skip first week of 10mg  PO BID of Eliquis.   Today, 11/25/2022 Heparin Level= 0.42, therapeutic on IV heparin at 1700 units/hr.  Has been therapeutic on heparin > 1 week.  Hgb 9.5 g/dL - low, but stable Plt 161 K/uL- slightly elevated No bleeding noted per RN  Goal of Therapy:  Heparin level 0.3-0.7 units/ml Monitor platelets by anticoagulation protocol: Yes   Plan:  Start Eliquis 5mg  PO BID Monitor CBC, s/s of bleed  Enzo Bi, PharmD, BCPS, BCIDP Clinical Pharmacist 11/25/2022 7:54 AM

## 2022-11-25 NOTE — Discharge Instructions (Signed)
Information on my medicine - ELIQUIS (apixaban)  Why was Eliquis prescribed for you? Eliquis was prescribed to treat blood clots that may have been found in the veins of your legs (deep vein thrombosis) or in your lungs (pulmonary embolism) and to reduce the risk of them occurring again.  What do You need to know about Eliquis ? Start 5 mg tablet taken TWICE daily.  Eliquis may be taken with or without food.   Try to take the dose about the same time in the morning and in the evening. If you have difficulty swallowing the tablet whole please discuss with your pharmacist how to take the medication safely.  Take Eliquis exactly as prescribed and DO NOT stop taking Eliquis without talking to the doctor who prescribed the medication.  Stopping may increase your risk of developing a new blood clot.  Refill your prescription before you run out.  After discharge, you should have regular check-up appointments with your healthcare provider that is prescribing your Eliquis.    What do you do if you miss a dose? If a dose of ELIQUIS is not taken at the scheduled time, take it as soon as possible on the same day and twice-daily administration should be resumed. The dose should not be doubled to make up for a missed dose.  Important Safety Information A possible side effect of Eliquis is bleeding. You should call your healthcare provider right away if you experience any of the following: Bleeding from an injury or your nose that does not stop. Unusual colored urine (red or dark brown) or unusual colored stools (red or black). Unusual bruising for unknown reasons. A serious fall or if you hit your head (even if there is no bleeding).  Some medicines may interact with Eliquis and might increase your risk of bleeding or clotting while on Eliquis. To help avoid this, consult your healthcare provider or pharmacist prior to using any new prescription or non-prescription medications, including  herbals, vitamins, non-steroidal anti-inflammatory drugs (NSAIDs) and supplements.  This website has more information on Eliquis (apixaban): http://[email or web address].[email or web address]

## 2022-11-26 DIAGNOSIS — R918 Other nonspecific abnormal finding of lung field: Secondary | ICD-10-CM | POA: Diagnosis not present

## 2022-11-26 DIAGNOSIS — I5020 Unspecified systolic (congestive) heart failure: Secondary | ICD-10-CM | POA: Diagnosis not present

## 2022-11-26 DIAGNOSIS — E278 Other specified disorders of adrenal gland: Secondary | ICD-10-CM | POA: Diagnosis not present

## 2022-11-26 DIAGNOSIS — I2694 Multiple subsegmental pulmonary emboli without acute cor pulmonale: Secondary | ICD-10-CM | POA: Diagnosis not present

## 2022-11-26 LAB — CBC
HCT: 30.6 % — ABNORMAL LOW (ref 36.0–46.0)
Hemoglobin: 9.9 g/dL — ABNORMAL LOW (ref 12.0–15.0)
MCH: 28.9 pg (ref 26.0–34.0)
MCHC: 32.4 g/dL (ref 30.0–36.0)
MCV: 89.5 fL (ref 80.0–100.0)
Platelets: 446 10*3/uL — ABNORMAL HIGH (ref 150–400)
RBC: 3.42 MIL/uL — ABNORMAL LOW (ref 3.87–5.11)
RDW: 16 % — ABNORMAL HIGH (ref 11.5–15.5)
WBC: 13 10*3/uL — ABNORMAL HIGH (ref 4.0–10.5)
nRBC: 0.2 % (ref 0.0–0.2)

## 2022-11-26 MED ORDER — FLUTICASONE FUROATE-VILANTEROL 100-25 MCG/ACT IN AEPB: 1.0000 | INHALATION_SPRAY | Freq: Every day | RESPIRATORY_TRACT | 0 refills | Status: DC

## 2022-11-26 MED ORDER — LOSARTAN POTASSIUM 25 MG PO TABS: 25.0000 mg | ORAL_TABLET | Freq: Every day | ORAL | 1 refills | Status: DC

## 2022-11-26 MED ORDER — ACETAMINOPHEN 325 MG PO TABS: 650.0000 mg | ORAL_TABLET | Freq: Four times a day (QID) | ORAL | Status: AC | PRN

## 2022-11-26 MED ORDER — APIXABAN 5 MG PO TABS: 5.0000 mg | ORAL_TABLET | Freq: Two times a day (BID) | ORAL | 2 refills | Status: DC

## 2022-11-26 MED ORDER — METOPROLOL SUCCINATE ER 50 MG PO TB24: 50.0000 mg | ORAL_TABLET | Freq: Every day | ORAL | 1 refills | Status: DC

## 2022-11-26 MED ORDER — ORAL CARE MOUTH RINSE: 15.0000 mL | OROMUCOSAL | Status: DC | PRN

## 2022-11-26 MED ORDER — SPIRONOLACTONE 25 MG PO TABS: 12.5000 mg | ORAL_TABLET | Freq: Every day | ORAL | 1 refills | Status: DC

## 2022-11-26 MED ORDER — OXYCODONE HCL 5 MG PO TABS: 5.0000 mg | ORAL_TABLET | Freq: Four times a day (QID) | ORAL | 0 refills | Status: DC | PRN

## 2022-11-26 NOTE — Discharge Summary (Addendum)
Physician Discharge Summary   Patient: Erin Pacheco MRN: [id number] DOB: 1950/07/09  Admit date:     11/16/2022  Discharge date: 11/26/22  Discharge Physician: [name]   PCP: Renaye Rakers, MD   Recommendations at discharge:      Discharge Diagnoses: Principal Problem:   Multiple subsegmental pulmonary emboli without acute cor pulmonale (HCC) Active Problems:   Lung mass   Adrenal mass, right (HCC)   HFrEF (heart failure with reduced ejection fraction) /EF 30 to 35 %   HTN (hypertension)   Smoker   Cardiomyopathy (HCC)   Acute systolic HF (heart failure) (HCC)   Iron deficiency anemia due to chronic blood loss  Resolved Problems:   * No resolved hospital problems. *  Hospital Course: 72 year old female with history of hypertension, tobacco abuse presents with abdominal pain, cough, productive sputum. On presentation D-dimer was 3.14, CT chest showed segmental pulmonary emboli, right upper lobe greater than left upper lobe with spiculated left upper lobe mass measuring 2.5 cm and right adrenal mass. Echocardiogram showed EF 30 to 35% with global hypokinesis. Patient started on heparin drip and transferred to [facility] per family request. Pulmonary/IR/cardiology/oncology were consulted. I recommended to rule out pheochromocytoma before proceeding with biopsy. Plasma metanephrine have been normal.   Assessment and Plan:  Acute pulmonary embolism -Likely due to underlying regnancy -Venous duplex of lower extremity was negative for DVT -Started on IV heparin, continue heparin drip -Heparin drip was restarted  after biopsy; heparin has been changed to apixaban -Currently not requiring oxygen -Echocardiogram showed EF 30 to 35% -Continue apixaban 5 mg p.o. twice daily   Left lung and right renal malignancy -Seen on CTA chest; IR consulted for continued biopsy -Recommended to rule out pheochromocytoma; last metanephrine normal Underwent right adrenal biopsy  yesterday, result pending -Follow-up with Dr. Myna Hidalgo as outpatient for biopsy results   Left ventricle dysfunction -Seen on echocardiogram, EF 30 to 35% -Started on ARB, Toprol XL, BNP 54.1 -Continue Aldactone -Cardiology has signed off   Acute kidney injury versus CKD -Creatinine was 1.26 on presentation, no previous renal function lab available -Creatinine improved to 0.95   COPD Tobacco abuse -Pulmonary following intermittently: Recommended to contact PCCM if additional biopsy is nondiagnostic or if results are not back prior to discharge and they can arrange outpatient workup of lung mass.  Continue current inhaled regimen.  Will need outpatient PFTs -Patient was counseled regarding tobacco cessation by admitting hospitalist -Patient to follow-up with Dr. Delton Coombes in 2 weeks   Hypertension -Monitor blood pressure.   Continue metoprolol, spironolactone and losartan as per cardiology.   Amlodipine has been discontinued.     Iron deficiency anemia -Likely superimposed on anemia of chronic disease.   Hemoglobin stable at 9.9 No signs of bleeding. -Follow-up Dr. Myna Hidalgo as outpatient for possible IV iron   Leukocytosis -Likely reactive.          Consultants: Pulmonology, cardiology, oncology, IR Procedures performed: Continue biopsy Disposition: Home Diet recommendation:  Discharge Diet Orders (From admission, onward)     Start     Ordered   11/26/22 0000  Diet - low sodium heart healthy        11/26/22 1125           Regular diet DISCHARGE MEDICATION: Allergies as of 11/26/2022   No Known Allergies      Medication List     STOP taking these medications    aliskiren 150 MG tablet Commonly known as: [employer]  diclofenac 75 MG EC tablet Commonly known as: VOLTAREN   ibuprofen 200 MG tablet Commonly known as: ADVIL   LORazepam 1 MG tablet Commonly known as: Ativan   valsartan-hydrochlorothiazide 160-12.5 MG tablet Commonly known as: DIOVAN-HCT        TAKE these medications    acetaminophen 325 MG tablet Commonly known as: TYLENOL Take 2 tablets (650 mg total) by mouth every 6 (six) hours as needed for mild pain (or Fever >/= 101).   apixaban 5 MG Tabs tablet Commonly known as: ELIQUIS Take 1 tablet (5 mg total) by mouth 2 (two) times daily.   dexamethasone 4 MG tablet Commonly known as: DECADRON Take 4 mg by mouth daily as needed. For arthritis pain   fluticasone furoate-vilanterol 100-25 MCG/ACT Aepb Commonly known as: BREO ELLIPTA Inhale 1 puff into the lungs daily. Start taking on: November 27, 2022   losartan 25 MG tablet Commonly known as: COZAAR Take 1 tablet (25 mg total) by mouth daily. Start taking on: November 27, 2022   metoprolol succinate 50 MG 24 hr tablet Commonly known as: TOPROL-XL Take 1 tablet (50 mg total) by mouth daily. Take with or immediately following a meal. Start taking on: November 27, 2022   multivitamin tablet Take 1 tablet by mouth daily.   oxyCODONE 5 MG immediate release tablet Commonly known as: Oxy IR/ROXICODONE Take 1 tablet (5 mg total) by mouth every 6 (six) hours as needed for moderate pain.   spironolactone 25 MG tablet Commonly known as: ALDACTONE Take 0.5 tablets (12.5 mg total) by mouth daily. Start taking on: November 27, 2022   SYSTANE OP Apply 1 drop to eye daily as needed (dry eye). Dry Eyes               Durable Medical Equipment  (From admission, onward)           Start     Ordered   11/23/22 1038  For home use only DME 4 wheeled rolling walker with seat  Once       Question:  Patient needs a walker to treat with the following condition  Answer:  Lung cancer (HCC)   11/23/22 1037   11/23/22 1038  For home use only DME 3 n 1  Once        11/23/22 1037            Follow-up Information     [facility] Follow up.   Why: 3 in 1 potty chair Roll[employer] Contact information: [address] [place], [place] [address]  Phone: [phone]        Azalee Course, [place] Follow up on 12/21/2022.   Specialties: Cardiology, Radiology Why: 3:10PM. Cardiology follow up Contact information: [address] [place] [address] [phone]         Josph Macho, MD. Schedule an appointment as soon as possible for a visit.   Specialty: Oncology Why: Follow-up on biopsy results Contact information: [address] [place] [address] [phone]         Leslye Peer, MD Follow up in 2 week(s).   Specialty: Pulmonary Disease Why: For bronchoscopy as outpatient Contact information: [address] [place] [address] [phone]                Discharge Exam: Filed Weights   11/24/22 0500 11/25/22 0500 11/26/22 0500  Weight: 61 kg 64.1 kg 62.1 kg   General-appears in no acute distress Heart-S1-S2, regular, no murmur  auscultated Lungs-clear to auscultation bilaterally, no wheezing or crackles auscultated Abdomen-soft, nontender, no organomegaly Extremities-no edema in the lower extremities Neuro-alert, oriented x3, no focal deficit noted  Condition at discharge: good  The results of significant diagnostics from this hospitalization (including imaging, microbiology, ancillary and laboratory) are listed below for reference.   Imaging Studies: CT BIOPSY  Result Date: 2022-12-11 INDICATION: 72 year old female with history of indeterminate right adrenal mass, history of stage IV lung cancer. EXAM: CT BIOPSY COMPARISON:  None Available. MEDICATIONS: None. ANESTHESIA/SEDATION: Fentanyl 100 mcg IV; Versed 2 mg IV Sedation time: 10 minutes; The patient was continuously monitored during the procedure by the interventional radiology nurse under my direct supervision. CONTRAST:  None. COMPLICATIONS: None immediate. PROCEDURE: RADIATION DOSE REDUCTION: This exam was performed according to the departmental dose-optimization program which includes automated exposure control, adjustment of the mA and/or kV according  to patient size and/or use of iterative reconstruction technique. Informed consent was obtained from the patient following an explanation of the procedure, risks, benefits and alternatives. A time out was performed prior to the initiation of the procedure. The patient was positioned in the right lateral decubitus position on the CT table and a limited CT was performed for procedural planning demonstrating similar appearing right adrenal mass. The procedure was planned. The operative site was prepped and draped in the usual sterile fashion. Appropriate trajectory was confirmed with a 22 gauge spinal needle after the adjacent tissues were anesthetized with 1% Lidocaine with epinephrine. Under intermittent CT guidance, a 17 gauge coaxial needle was advanced into the peripheral aspect of the mass. Appropriate positioning was confirmed and total of 2 samples were obtained with an 18 gauge core needle biopsy device. The co-axial needle was removed while administering Gel-Foam slurry along the needle track and superficial hemostasis was achieved with manual compression. A limited postprocedural CT was negative for hemorrhage or additional complication. A dressing was placed. The patient tolerated the procedure well without immediate postprocedural complication. IMPRESSION: Technically successful CT guided core needle biopsy of right adrenal mass. Marliss Coots, MD [facility] Radiology Electronically Signed   By: Marliss Coots M.D.   On: December 11, 2022 16:51   NM Bone Scan Whole Body  Result Date: 11/18/2022 CLINICAL DATA:  Lung cancer staging EXAM: NUCLEAR MEDICINE WHOLE BODY BONE SCAN TECHNIQUE: Whole body anterior and posterior images were obtained approximately 3 hours after intravenous injection of radiopharmaceutical. RADIOPHARMACEUTICALS:  22.0 mCi Technetium-17m MDP IV COMPARISON:  Chest CT 11/16/2022.  Abdomen pelvis CT 11/17/2022 FINDINGS: There is physiologic  distribution radiotracer along the kidneys and bladder. Presumed injection site along the right antecubital region. Degenerative type areas of uptake seen along the left shoulder greater than right, midthoracic, lower lumbar spine and along the sacroiliac joint regions. Also degenerative type uptake along the ankles and feet. No additional areas of abnormal radiotracer uptake to suggest osseous metastatic disease. IMPRESSION: Multifocal degenerative type uptake. No scintigraphic evidence of osseous metastatic disease. Electronically Signed   By: Karen Kays M.D.   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 Pulmonary emboli. Staging. EXAM: MRI HEAD WITHOUT AND WITH CONTRAST TECHNIQUE: Multiplanar, multiecho pulse sequences of the brain and surrounding structures were obtained without and with intravenous contrast. CONTRAST:  6mL GADAVIST GADOBUTROL 1 MMOL/ML IV SOLN COMPARISON:  Recent CT Chest, Abdomen, and Pelvis 11/16/2022 and 11/17/2022. Head CT 08/30/2017. FINDINGS: Brain: No  restricted diffusion to suggest acute infarction. No midline shift, mass effect, evidence of mass lesion, ventriculomegaly, extra-axial collection or acute intracranial hemorrhage. Cervicomedullary junction and pituitary are within normal limits. No abnormal enhancement identified. No abnormal dural thickening or enhancement identified. Cerebral volume is within normal limits for age. Wallace Cullens and white matter signal is within normal limits for age throughout the brain. No cortical encephalomalacia or chronic cerebral blood products identified. Vascular: Major intracranial vascular flow voids are preserved. The major dural venous sinuses are enhancing and appear to be patent. Skull and upper cervical spine: Chronic cervical spine disc and endplate degeneration with degenerative appearing endplate marrow signal changes.  Background bone marrow signal is within normal limits. No suspicious osseous lesion identified. Sinuses/Orbits: Postoperative changes to both globes, otherwise negative orbits. Trace paranasal sinus mucosal thickening. Other: Mastoids are clear. Visible internal auditory structures appear normal. Numerous small but benign appearing nasopharyngeal retention cysts, T2 hyperintense and nonenhancing following contrast. Negative visible scalp and face. IMPRESSION: No metastatic disease or acute intracranial abnormality. Normal for age MRI appearance of the brain. Electronically Signed   By: Odessa Fleming M.D.   On: 11/18/2022 07:30   CT ABDOMEN PELVIS W WO CONTRAST  Result Date: 11/17/2022 CLINICAL DATA:  Spiculated 2.4 cm left upper lobe lung nodule on recent chest CT, which also partially included a right adrenal mass concerning for the possibility of lung cancer with a metastatic lesion. Today's exam is for further characterization of the right adrenal mass. EXAM: CT ABDOMEN AND PELVIS WITHOUT AND WITH CONTRAST TECHNIQUE: Multidetector CT imaging of the abdomen and pelvis was performed following the standard protocol before and following the bolus administration of intravenous contrast. RADIATION DOSE REDUCTION: This exam was performed according to the departmental dose-optimization program which includes automated exposure control, adjustment of the mA and/or kV according to patient size and/or use of iterative reconstruction technique. CONTRAST:  OMNIPAQUE IOHEXOL 300 MG/ML  SOLN COMPARISON:  Overlapping portions CT chest 11/16/2022 FINDINGS: Lower chest: Atelectasis noted along both lung bases, mildly increased in the right middle lobe compared to previous, and with some hazy non dependent peripheral opacity in the left lower lobe for example on image 12 series 4 similar to previous. Pneumonia is not excluded. Mild cardiomegaly is present. Descending thoracic aortic atherosclerosis. Hepatobiliary: Unremarkable  Pancreas: Unremarkable Spleen: Unremarkable Adrenals/Urinary Tract: The left adrenal gland appears normal. 5.2 by 3.1 by 4.3 cm right adrenal mass demonstrates precontrast internal density of 33 Hounsfield units and a absolute washout of -58% and relative washout of -25%, both indeterminate. The lesion demonstrates a higher degree of enhancement peripherally rather than centrally, potentially reflecting some degree of central necrosis. Morphologically the lesion is suspicious and concerning for potential malignancy and there is some faint stranding along its inferior margin. A 1.2 cm simple cyst of the right kidney upper pole is present on image 49 series 2. No further imaging workup of this lesion is indicated. Mild scarring in the left kidney noted. Stomach/Bowel: Unremarkable Vascular/Lymphatic: Atherosclerosis is present, including aortoiliac atherosclerotic disease. There is substantial atheromatous calcification in the superior mesenteric artery, without overt occlusion. Mild atheromatous plaque proximally in the celiac trunk without occlusion. No pathologic adenopathy. Reproductive: Uterus absent.  Adnexa unremarkable. Other: No supplemental non-categorized findings. Musculoskeletal: Dextroconvex lumbar scoliosis with rotary component. Grade 1 degenerative anterolisthesis at L2-3 and L3-4 with substantial loss of disc height and endplate sclerosis at the L4-5 and L5-S1 levels. In conjunction with intervertebral and facet spurring as well as degenerative disc disease,  there is substantial impingement at all levels between L2 and S1. Small bilateral groin hernias contain adipose tissue. IMPRESSION: 1. 5.2 cm right adrenal mass is suspicious for malignancy. Biopsy or other appropriate confirmation for example with PET-CT imaging would be suggested. 2. Aortic atherosclerosis. 3. Substantial impingement at all levels between L2 and S1 due to scoliosis, spondylosis, and degenerative disc disease. 4. Small bilateral  groin hernias contain adipose tissue. 5. Mild cardiomegaly. 6. Atelectasis along both lung bases, mildly increased in the right middle lobe compared to previous, and with some hazy non dependent peripheral opacity in the left lower lobe similar to previous. Pneumonia is not excluded. Aortic Atherosclerosis (ICD10-I70.0). Electronically Signed   By: Gaylyn Rong M.D.   On: 11/17/2022 18:54   VAS [place] LOWER EXTREMITY VENOUS (DVT)  Result Date: 11/17/2022  Lower Venous DVT Study Patient Name:  ANWITA MENCER  Date of Exam:   11/17/2022 Medical Rec #: [id number]         Accession #:    [id number] Date of Birth: January 16, 1951         Patient Gender: F Patient Age:   72 years Exam Location:  [facility] Procedure:      VAS [place] LOWER EXTREMITY VENOUS (DVT) Referring Phys: COURAGE EMOKPAE --------------------------------------------------------------------------------  Indications: Swelling.  Risk Factors: Confirmed PE. Anticoagulation: Heparin. Limitations: Poor ultrasound/tissue interface. Comparison Study: No prior studies. Performing Technologist: Chanda Busing RVT  Examination Guidelines: A complete evaluation includes B-mode imaging, spectral Doppler, color Doppler, and power Doppler as needed of all accessible portions of each vessel. Bilateral testing is considered an integral part of a complete examination. Limited examinations for reoccurring indications may be performed as noted. The reflux portion of the exam is performed with the patient in reverse Trendelenburg.  +---------+---------------+---------+-----------+----------+--------------+ RIGHT    CompressibilityPhasicitySpontaneityPropertiesThrombus Aging +---------+---------------+---------+-----------+----------+--------------+ CFV      Full           Yes      Yes                                 +---------+---------------+---------+-----------+----------+--------------+ SFJ      Full                                                         +---------+---------------+---------+-----------+----------+--------------+ FV Prox  Full                                                        +---------+---------------+---------+-----------+----------+--------------+ FV Mid   Full                                                        +---------+---------------+---------+-----------+----------+--------------+ FV DistalFull                                                        +---------+---------------+---------+-----------+----------+--------------+  PFV      Full                                                        +---------+---------------+---------+-----------+----------+--------------+ POP      Full           Yes      Yes                                 +---------+---------------+---------+-----------+----------+--------------+ PTV      Full                                                        +---------+---------------+---------+-----------+----------+--------------+ PERO     Full                                                        +---------+---------------+---------+-----------+----------+--------------+   +---------+---------------+---------+-----------+----------+--------------+ LEFT     CompressibilityPhasicitySpontaneityPropertiesThrombus Aging +---------+---------------+---------+-----------+----------+--------------+ CFV      Full           Yes      Yes                                 +---------+---------------+---------+-----------+----------+--------------+ SFJ      Full                                                        +---------+---------------+---------+-----------+----------+--------------+ FV Prox  Full                                                        +---------+---------------+---------+-----------+----------+--------------+ FV Mid   Full                                                         +---------+---------------+---------+-----------+----------+--------------+ FV DistalFull                                                        +---------+---------------+---------+-----------+----------+--------------+ PFV      Full                                                        +---------+---------------+---------+-----------+----------+--------------+  POP      Full           Yes      Yes                                 +---------+---------------+---------+-----------+----------+--------------+ PTV      Full                                                        +---------+---------------+---------+-----------+----------+--------------+ PERO     Full                                                        +---------+---------------+---------+-----------+----------+--------------+     Summary: RIGHT: - There is no evidence of deep vein thrombosis in the lower extremity.  - No cystic structure found in the popliteal fossa.  LEFT: - There is no evidence of deep vein thrombosis in the lower extremity.  - No cystic structure found in the popliteal fossa.  *See table(s) above for measurements and observations. Electronically signed by Lemar Livings MD on 11/17/2022 at 11:04:10 AM.    Final    ECHOCARDIOGRAM COMPLETE  Result Date: 11/16/2022    ECHOCARDIOGRAM REPORT   Patient Name:   ARNA LUIS Date of Exam: 11/16/2022 Medical Rec #:  [id number]        Height:       64.0 in Accession #:    [id number]       Weight:       140.0 lb Date of Birth:  1950/07/19        BSA:          1.681 m Patient Age:    71 years         BP:           158/115 mmHg Patient Gender: F                HR:           78 bpm. [facility] Procedure: 2D Echo, Cardiac Doppler and Color Doppler Indications:    Pulmonary Embolus I26.09  History:        Patient has prior history of Echocardiogram examinations, most                 recent 11/16/2001. Signs/Symptoms:Murmur; Risk                  Factors:Hypertension.  Sonographer:    Aron Baba Referring Phys: [id number] COURAGE EMOKPAE IMPRESSIONS  1. Left ventricular ejection fraction, by estimation, is 30 to 35%. The left ventricle has moderately decreased function. The left ventricle demonstrates global hypokinesis. Left ventricular diastolic parameters are indeterminate.  2. Right ventricular systolic function is normal. The right ventricular size is normal.  3. Left atrial size was mildly dilated.  4. A small pericardial effusion is present. The pericardial effusion is circumferential.  5. The mitral valve is normal in structure. No evidence of mitral valve regurgitation. No evidence of mitral stenosis.  6. The aortic valve was not well visualized. Aortic valve regurgitation is mild to  moderate. FINDINGS  Left Ventricle: Left ventricular ejection fraction, by estimation, is 30 to 35%. The left ventricle has moderately decreased function. The left ventricle demonstrates global hypokinesis. Definity contrast agent was given IV to delineate the left ventricular endocardial borders. The left ventricular internal cavity size was normal in size. There is no left ventricular hypertrophy. Left ventricular diastolic parameters are indeterminate. Right Ventricle: The right ventricular size is normal. Right vetricular wall thickness was not well visualized. Right ventricular systolic function is normal. Left Atrium: Left atrial size was mildly dilated. Right Atrium: Right atrial size was normal in size. Pericardium: A small pericardial effusion is present. The pericardial effusion is circumferential. Mitral Valve: The mitral valve is normal in structure. No evidence of mitral valve regurgitation. No evidence of mitral valve stenosis. Tricuspid Valve: The tricuspid valve is normal in structure. Tricuspid valve regurgitation is mild . No evidence of tricuspid stenosis. Aortic Valve: The aortic valve was not well visualized. Aortic valve regurgitation is mild to  moderate. Aortic regurgitation PHT measures 442 msec. Aortic valve mean gradient measures 8.0 mmHg. Aortic valve peak gradient measures 16.6 mmHg. Aortic valve area, by VTI measures 1.49 cm. Pulmonic Valve: The pulmonic valve was not well visualized. Pulmonic valve regurgitation is not visualized. No evidence of pulmonic stenosis. Aorta: The aortic root is normal in size and structure. IAS/Shunts: The interatrial septum was not well visualized.  LEFT VENTRICLE PLAX 2D LVIDd:         4.45 cm   Diastology LVIDs:         4.00 cm   LV e' medial:    4.99 cm/s LV PW:         1.20 cm   LV E/e' medial:  38.5 LV IVS:        0.90 cm   LV e' lateral:   5.43 cm/s LVOT diam:     2.00 cm   LV E/e' lateral: 35.4 LV SV:         53 LV SV Index:   32 LVOT Area:     3.14 cm  RIGHT VENTRICLE RV S prime:     13.10 cm/s TAPSE (M-mode): 1.6 cm LEFT ATRIUM           Index        RIGHT ATRIUM           Index LA diam:      2.70 cm 1.61 cm/m   RA Area:     14.00 cm LA Vol (A2C): 36.5 ml 21.71 ml/m  RA Volume:   34.70 ml  20.64 ml/m LA Vol (A4C): 58.3 ml 34.68 ml/m  AORTIC VALVE                     PULMONIC VALVE AV Area (Vmax):    1.57 cm      PR End Diast Vel: 5.20 msec AV Area (Vmean):   1.47 cm AV Area (VTI):     1.49 cm AV Vmax:           204.00 cm/s AV Vmean:          128.000 cm/s AV VTI:            0.357 m AV Peak Grad:      16.6 mmHg AV Mean Grad:      8.0 mmHg LVOT Vmax:         102.19 cm/s LVOT Vmean:        59.811 cm/s LVOT VTI:  0.170 m LVOT/AV VTI ratio: 0.48 AI PHT:            442 msec  AORTA Ao Root diam: 3.00 cm Ao Asc diam:  3.10 cm MITRAL VALVE                TRICUSPID VALVE MV Area (PHT): 9.25 cm     TR Peak grad:   23.8 mmHg MV Decel Time: 82 msec      TR Vmax:        244.00 cm/s MR Peak grad: 7.7 mmHg MR Vmax:      139.00 cm/s   SHUNTS MV E velocity: 192.00 cm/s  Systemic VTI:  0.17 m MV A velocity: 48.40 cm/s   Systemic Diam: 2.00 cm MV E/A ratio:  3.97 Dina Rich MD Electronically signed by  Dina Rich MD Signature Date/Time: 11/16/2022/3:04:20 PM    Final    CT Angio Chest Pulmonary Embolism (PE) W or WO Contrast  Result Date: 11/16/2022 CLINICAL DATA:  Chest pain EXAM: CT ANGIOGRAPHY CHEST WITH CONTRAST TECHNIQUE: Multidetector CT imaging of the chest was performed using the standard protocol during bolus administration of intravenous contrast. Multiplanar CT image reconstructions and MIPs were obtained to evaluate the vascular anatomy. RADIATION DOSE REDUCTION: This exam was performed according to the departmental dose-optimization program which includes automated exposure control, adjustment of the mA and/or kV according to patient size and/or use of iterative reconstruction technique. CONTRAST:  75mL OMNIPAQUE IOHEXOL 350 MG/ML SOLN COMPARISON:  X-ray 11/16/2022 FINDINGS: Cardiovascular: Heart is mildly enlarged. Small pericardial effusion. Coronary artery calcifications are seen. The thoracic aorta has a normal course and caliber with scattered atherosclerotic plaque. Multiple areas of pulmonary emboli identified most striking in segmental branches in the right upper lobe such as axial series 4, image 43. Additional subtle area in the anterior left lower lobe on image 54. No large or central embolus. No signs of right heart strain at this time. Mediastinum/Nodes: Patulous esophagus. Small thyroid gland. No specific abnormal lymph node enlargement identified in the axillary regions, hilum or mediastinum. Lungs/Pleura: Centrilobular emphysematous changes are seen greatest in the upper lung zones. There is spiculated left upper lobe mass near the margin of the hilum and mediastinum measuring on series 6, image 49 at 2.5 by 2.1 cm. There is some linear opacity lung bases likely scar or atelectasis. Subtle opacity as well in the left lower lobe. Trace left pleural fluid. No pneumothorax. Breathing motion. Additional nodule in the lingula on series 6 image 86 measuring 7 mm. Upper Abdomen: Right  adrenal mass identified measuring 4.5 by 2.9 cm. Nodular thickening of the left adrenal gland as well. Musculoskeletal: Degenerative changes seen along the spine. Critical Value/emergent results were called by telephone at the time of interpretation on 11/16/2022 at 7:48 am to provider Vonita Moss , who verbally acknowledged these results. Review of the MIP images confirms the above findings. IMPRESSION: Segmental pulmonary emboli right upper lobe greater than left lower lobe. Small amount of clot burden. Mildly enlarged heart with a small pericardial effusion. 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 Separate 7 mm lingular lesion. Right adrenal mass identified at the edge of the imaging field. Malignant or metastatic lesion is possible. Recommend overall further evaluation. PET-CT scan may be useful as the next step in the workup versus sampling. Tiny left pleural effusion Aortic Atherosclerosis (ICD10-I70.0) and Emphysema (ICD10-J43.9). Electronically Signed   By: Karen Kays M.D.   On: 11/16/2022  10:52   DG Chest 2 View  Result Date: 11/16/2022 CLINICAL DATA:  chest pain EXAM: CHEST - 2 VIEW COMPARISON:  None Available. FINDINGS: No pleural effusion. No pneumothorax. No focal airspace opacity. Normal cardiac and mediastinal contours. No radiographically apparent displaced rib fractures. Visualized upper abdomen is unremarkable. Vertebral body heights are maintained. IMPRESSION: No focal airspace opacity. Electronically Signed   By: Lorenza Cambridge M.D.   On: 11/16/2022 09:21    Microbiology: Results for orders placed or performed in visit on 02/21/19  Novel Coronavirus, NAA ([facility])     Status: None   Collection Time: 02/21/19  3:30 PM   Specimen: Nasopharyngeal(NP) swabs in vial transport medium   NASOPHARYNGE  TESTING  Result Value Ref Range Status   SARS-CoV-2, NAA Not Detected Not Detected Final    Comment: This nucleic acid amplification  test was developed and its performance characteristics determined by [employer]. Nucleic acid amplification tests include PCR and TMA. This test has not been FDA cleared or approved. This test has been authorized by FDA under an Emergency Use Authorization (EUA). This test is only authorized for the duration of time the declaration that circumstances exist justifying the authorization of the emergency use of in vitro diagnostic tests for detection of SARS-CoV-2 virus and/or diagnosis of COVID-19 infection under section 564(b)(1) of the Act, 21 U.S.C. 657QIO-9(G) (1), unless the authorization is terminated or revoked sooner. When diagnostic testing is negative, the possibility of a false negative result should be considered in the context of a patient's recent exposures and the presence of clinical signs and symptoms consistent with COVID-19. An individual without symptoms of COVID-19 and who is not shedding SARS-CoV-2 virus would  expect to have a negative (not detected) result in this assay.     Labs: CBC: Recent Labs  Lab 11/22/22 0351 11/23/22 0417 11/24/22 0418 11/25/22 0409 11/26/22 0435  WBC 11.2* 12.6* 12.3* 12.6* 13.0*  HGB 10.2* 10.0* 9.5* 9.5* 9.9*  HCT 30.6* 30.8* 29.0* 28.5* 30.6*  MCV 88.4 88.0 88.7 88.5 89.5  PLT 413* 431* 418* 436* 446*   Basic Metabolic Panel: Recent Labs  Lab 11/21/22 0347 11/22/22 0351 11/23/22 0417  NA 136 137 136  K 4.1 3.8 3.9  CL 104 103 102  CO2 22 24 25   GLUCOSE 103* 110* 102*  BUN 12 12 12   CREATININE 0.91 0.97 0.95  CALCIUM 8.6* 8.9 9.1  MG 2.1 2.0 2.0   Liver Function Tests: No results for input(s): "AST", "ALT", "ALKPHOS", "BILITOT", "PROT", "ALBUMIN" in the last 168 hours. CBG: No results for input(s): "GLUCAP" in the last 168 hours.  Discharge time spent: greater than 30 minutes.  Signed: [name], MD [facility] 11/26/2022

## 2022-11-28 ENCOUNTER — Telehealth: Payer: Self-pay | Admitting: Pulmonary Disease

## 2022-11-28 LAB — SURGICAL PATHOLOGY

## 2022-11-28 NOTE — Telephone Encounter (Signed)
Erin Pacheco Daughter [phone]

## 2022-11-28 NOTE — Telephone Encounter (Signed)
[name] calling. States she got back the results back. I see this appointment is made on condition of those result. Please call back to advise:  Appt notes state:  Left upper lobe 2.5 cm mass suspicious for primary lung cancer. Underwent successful biopsy of adrenal mass. Pathology not back. Please arrange clinic follow-up with Dr. Delton Coombes or Dr. Tonia Brooms in the next 2 to 3 weeks to discuss need for consideration of navigational bronchoscopy if biopsy of adrenal lesion is nondiagnostic or negative for malignancy. Of course, if the adrenal lesion gives [place] staging and diagnosis this appointment could be canceled.

## 2022-11-29 ENCOUNTER — Encounter: Payer: Self-pay | Admitting: *Deleted

## 2022-11-29 DIAGNOSIS — E278 Other specified disorders of adrenal gland: Secondary | ICD-10-CM

## 2022-11-29 DIAGNOSIS — R918 Other nonspecific abnormal finding of lung field: Secondary | ICD-10-CM

## 2022-11-29 NOTE — Telephone Encounter (Signed)
Spoke with the pt's daughter, Erin Pacheco She was asking about results of bx  She put me on the phone with pt, and pt gave verbal permission for me to speak with her  I shared with her the results from Dr. Laurena Spies note from 11/25/22:  Erin Burly, MD   [facility]   11/25/22 10:16 AM Note Left upper lobe 2.5 cm mass suspicious for primary lung cancer.  Underwent successful biopsy of adrenal mass.  Pathology not back.  Please arrange clinic follow-up with Dr. Delton Coombes or Dr. Tonia Brooms in the next 2 to 3 weeks to discuss need for consideration of navigational bronchoscopy if biopsy of adrenal lesion is nondiagnostic or negative for malignancy.  Of course, if the adrenal lesion gives [place] staging and diagnosis this appointment could be canceled.  Probably worth discussing in clinic her candidacy for procedures given she has a reduced EF and now PE on anticoagulation.     She is scheduled for visit with Dr Tonia Brooms 12/22/22  Daughter is asking that you call her sooner to discuss results in more detail  Routing to Dr Judeth Horn

## 2022-11-29 NOTE — Telephone Encounter (Signed)
Called and spoke with Erin Pacheco. She was calling again to see if the appt for 7/18 could be moved up. She received a call from the cancer center today for an appt but she wanted to speak with Dr. Judeth Horn directly in regards to the test results.   She can be reached at [phone] directly.   Dr. Judeth Horn, can you please advise? Thanks!

## 2022-11-29 NOTE — Telephone Encounter (Signed)
PT daughter calling again. Highly agitated no one has called her. Insisting on talking with a Dr. now. Wants to know what Pathology report states.  I advised her we have no Lung Nodule appointments available any sooner because she mentioned wanting to be seen sooner than 7/18 if report was bad.   Sending high priority due to her agitation.

## 2022-11-29 NOTE — Progress Notes (Signed)
Per Dr Myna Hidalgo, request for [facility] One testing sent on specimen [id number] DOS 11/23/2022.  Patient was seen by Dr Myna Hidalgo during her most recent hospitalization. He would like to see her in follow up here this week.   Multiple calls from the daughter received by this office to set up appointment. I called [name], but was unable to make contact. Her voicemail was full.   Received message from navigator at [facility] that patient wished to be seen at [facility]. Message sent to both GI and Lung navigator to let them know of patient's impending referral. Clinically patient was thought to have a primary lung, but path is showing GI origin.   Oncology Nurse Navigator Documentation     11/29/2022   11:00 AM  Oncology Nurse Navigator Flowsheets  Navigator Location [employer]  Navigator Encounter Type Molecular Studies;Appt/Treatment Plan Review;Telephone  Telephone Outgoing Call  Barriers/Navigation Needs Coordination of Care  Interventions Coordination of Care  Coordination of Care Pathology  Time Spent with Patient 45

## 2022-11-30 ENCOUNTER — Other Ambulatory Visit: Payer: Self-pay

## 2022-11-30 DIAGNOSIS — C801 Malignant (primary) neoplasm, unspecified: Secondary | ICD-10-CM

## 2022-11-30 DIAGNOSIS — E278 Other specified disorders of adrenal gland: Secondary | ICD-10-CM

## 2022-11-30 NOTE — Telephone Encounter (Signed)
Nothing further needed.  Closing encounter.

## 2022-11-30 NOTE — Telephone Encounter (Signed)
Discussed with [name], daughter.  Adrenal mass consistent with adenocarcinoma.  It appears there is some uncertainty as to the origin.  There is ongoing testing requested by Dr. Myna Hidalgo on my review of records.  I advised [name] to have her mother follow-up at the [facility] lung cancer center as her desire.  While patient lives in [place], she is currently residing with daughter in [place] for ongoing care.  Discussed that if additional tissue was needed regarding the lung mass, that we should keep the appointment with Dr. Tonia Brooms on July 18 and discuss if navigational or other means of biopsy would be most beneficial if still needed.  [name] states she will call the cancer center this afternoon and work on scheduling appointments for her mother.  All questions answered to the best of my ability.

## 2022-11-30 NOTE — Progress Notes (Signed)
I spoke with Erin Pacheco and her daughter, Erin Pacheco.  They are available for an appt with Karena Addison and Dr Leonides Schanz on 7/2 at 1400.  I asked that they arrive by 1345 for registration purposes.  They are aware of our location and valet services.  I also let them know we were placing a referral for [facility] for upper endoscopy.  All questions were answered.  they verbalized understanding.

## 2022-12-02 ENCOUNTER — Encounter: Payer: Self-pay | Admitting: Internal Medicine

## 2022-12-02 ENCOUNTER — Ambulatory Visit (INDEPENDENT_AMBULATORY_CARE_PROVIDER_SITE_OTHER): Payer: Medicare HMO | Admitting: Internal Medicine

## 2022-12-02 ENCOUNTER — Other Ambulatory Visit: Payer: Self-pay

## 2022-12-02 ENCOUNTER — Other Ambulatory Visit (INDEPENDENT_AMBULATORY_CARE_PROVIDER_SITE_OTHER): Payer: Medicare HMO

## 2022-12-02 ENCOUNTER — Ambulatory Visit: Payer: Medicare HMO | Admitting: Internal Medicine

## 2022-12-02 ENCOUNTER — Other Ambulatory Visit: Payer: Medicare HMO

## 2022-12-02 VITALS — BP 122/62 | HR 68 | Ht 63.0 in | Wt 134.0 lb

## 2022-12-02 DIAGNOSIS — C801 Malignant (primary) neoplasm, unspecified: Secondary | ICD-10-CM

## 2022-12-02 DIAGNOSIS — K5900 Constipation, unspecified: Secondary | ICD-10-CM

## 2022-12-02 DIAGNOSIS — D509 Iron deficiency anemia, unspecified: Secondary | ICD-10-CM | POA: Diagnosis not present

## 2022-12-02 LAB — HEPATIC FUNCTION PANEL
ALT: 18 U/L (ref 0–35)
AST: 12 U/L (ref 0–37)
Albumin: 3.5 g/dL (ref 3.5–5.2)
Alkaline Phosphatase: 72 U/L (ref 39–117)
Bilirubin, Direct: 0.1 mg/dL (ref 0.0–0.3)
Total Bilirubin: 0.4 mg/dL (ref 0.2–1.2)
Total Protein: 8.2 g/dL (ref 6.0–8.3)

## 2022-12-02 MED ORDER — NA SULFATE-K SULFATE-MG SULF 17.5-3.13-1.6 GM/177ML PO SOLN: ORAL | 0 refills | Status: DC

## 2022-12-02 NOTE — Progress Notes (Unsigned)
Chief Complaint: Discussion of EGD  HPI : 72 year old female with history of adrenal mass with adenocarcinoma, HFrEF (EF 30-35%), pulmonary embolism presents for discussion of EGD  Denies abdominal pain, melena, hematochezia, weight loss, N&V, dysphagia, odynophagia, chest burning, or regurgitation. Denies prior EGD or colonoscopy. Her last Cologuard test wasin 06/2022 that was negative. Mother had ovarian cancer. Brother had lung cancer. She has had a cough for the last year. She odes chronically hav econstipation. She is usulaly uses Miralax every day. She has one BM once every 2-4 days. Denies personal history of pancreatits or liver issues.   Past Medical History:  Diagnosis Date   Arthritis    Cancer (HCC)    Hypertension    Tobacco abuse      Past Surgical History:  Procedure Laterality Date   ABDOMINAL HYSTERECTOMY     BILATERAL OOPHORECTOMY     CATARACT EXTRACTION W/PHACO  12/27/2010   Procedure: CATARACT EXTRACTION PHACO AND INTRAOCULAR LENS PLACEMENT (IOC);  Surgeon: Gemma Payor;  Location: [facility];  Service: Ophthalmology;  Laterality: Right;   CATARACT EXTRACTION W/PHACO  03/28/2011   Procedure: CATARACT EXTRACTION PHACO AND INTRAOCULAR LENS PLACEMENT (IOC);  Surgeon: Gemma Payor;  Location: [facility];  Service: Ophthalmology;  Laterality: Left;  CDE 7.27   Family History  Problem Relation Age of Onset   Ovarian cancer Mother    Anesthesia problems Neg Hx    Hypotension Neg Hx    Malignant hyperthermia Neg Hx    Pseudochol deficiency Neg Hx    Social History   Tobacco Use   Smoking status: Every Day    Packs/day: 0.50    Years: 30.00    Additional pack years: 0.00    Total pack years: 15.00    Types: Cigarettes   Smokeless tobacco: Never  Vaping Use   Vaping Use: Never used  Substance Use Topics   Alcohol use: No   Drug use: No   Current Outpatient Medications  Medication Sig Dispense Refill   acetaminophen (TYLENOL) 325 MG tablet Take 2 tablets (650 mg  total) by mouth every 6 (six) hours as needed for mild pain (or Fever >/= 101).     apixaban (ELIQUIS) 5 MG TABS tablet Take 1 tablet (5 mg total) by mouth 2 (two) times daily. 60 tablet 2   dexamethasone (DECADRON) 4 MG tablet Take 4 mg by mouth daily as needed. For arthritis pain     fluticasone furoate-vilanterol (BREO ELLIPTA) 100-25 MCG/ACT AEPB Inhale 1 puff into the lungs daily. 28 each 0   losartan (COZAAR) 25 MG tablet Take 1 tablet (25 mg total) by mouth daily. 30 tablet 1   metoprolol succinate (TOPROL-XL) 50 MG 24 hr tablet Take 1 tablet (50 mg total) by mouth daily. Take with or immediately following a meal. 30 tablet 1   Multiple Vitamin (MULTIVITAMIN) tablet Take 1 tablet by mouth daily.       oxyCODONE (OXY IR/ROXICODONE) 5 MG immediate release tablet Take 1 tablet (5 mg total) by mouth every 6 (six) hours as needed for moderate pain. 20 tablet 0   Polyethyl Glycol-Propyl Glycol (SYSTANE OP) Apply 1 drop to eye daily as needed (dry eye). Dry Eyes     spironolactone (ALDACTONE) 25 MG tablet Take 0.5 tablets (12.5 mg total) by mouth daily. 30 tablet 1   No current facility-administered medications for this visit.   No Known Allergies   Review of Systems: All systems reviewed and negative except where noted in HPI.  Physical Exam: BP 122/62   Pulse 68   Ht 5\' 3"  (1.6 m)   Wt 134 lb (60.8 kg)   BMI 23.74 kg/m  Constitutional: Pleasant,well-developed, ***female in no acute distress. HEENT: Normocephalic and atraumatic. Conjunctivae are normal. No scleral icterus. Cardiovascular: Normal rate, regular rhythm.  Pulmonary/chest: Effort normal and breath sounds normal. No wheezing, rales or rhonchi. Abdominal: Soft, nondistended, nontender. Bowel sounds active throughout. There are no masses palpable. No hepatomegaly. Extremities: No edema Neurological: Alert and oriented to person place and time. Skin: Skin is warm and dry. No rashes noted. Psychiatric: Normal mood and  affect. Behavior is normal.  Labs 11/2022: CBC with elevated WBC of 13 and low Hb of 9.9.  CT BIOPSY  Result Date: 11/23/2022 INDICATION: 72 year old female with history of indeterminate right adrenal mass, history of stage IV lung cancer. EXAM: CT BIOPSY COMPARISON:  None Available. MEDICATIONS: None. ANESTHESIA/SEDATION: Fentanyl 100 mcg IV; Versed 2 mg IV Sedation time: 10 minutes; The patient was continuously monitored during the procedure by the interventional radiology nurse under my direct supervision. CONTRAST:  None. COMPLICATIONS: None immediate. PROCEDURE: RADIATION DOSE REDUCTION: This exam was performed according to the departmental dose-optimization program which includes automated exposure control, adjustment of the mA and/or kV according to patient size and/or use of iterative reconstruction technique. Informed consent was obtained from the patient following an explanation of the procedure, risks, benefits and alternatives. A time out was performed prior to the initiation of the procedure. The patient was positioned in the right lateral decubitus position on the CT table and a limited CT was performed for procedural planning demonstrating similar appearing right adrenal mass. The procedure was planned. The operative site was prepped and draped in the usual sterile fashion. Appropriate trajectory was confirmed with a 22 gauge spinal needle after the adjacent tissues were anesthetized with 1% Lidocaine with epinephrine. Under intermittent CT guidance, a 17 gauge coaxial needle was advanced into the peripheral aspect of the mass. Appropriate positioning was confirmed and total of 2 samples were obtained with an 18 gauge core needle biopsy device. The co-axial needle was removed while administering Gel-Foam slurry along the needle track and superficial hemostasis was achieved with manual compression. A limited postprocedural CT was negative for hemorrhage or additional complication. A dressing was  placed. The patient tolerated the procedure well without immediate postprocedural complication. IMPRESSION: Technically successful CT guided core needle biopsy of right adrenal mass. Marliss Coots, MD [facility] Electronically Signed   By: Marliss Coots M.D.   On: 11/23/2022 16:51   NM Bone Scan Whole Body  Result Date: 11/18/2022 CLINICAL DATA:  Lung cancer staging EXAM: NUCLEAR MEDICINE WHOLE BODY BONE SCAN TECHNIQUE: Whole body anterior and posterior images were obtained approximately 3 hours after intravenous injection of radiopharmaceutical. RADIOPHARMACEUTICALS:  22.0 mCi Technetium-40m MDP IV COMPARISON:  Chest CT 11/16/2022.  Abdomen pelvis CT 11/17/2022 FINDINGS: There is physiologic distribution radiotracer along the kidneys and bladder. Presumed injection site along the right antecubital region. Degenerative type areas of uptake seen along the left shoulder greater than right, midthoracic, lower lumbar spine and along the sacroiliac joint regions. Also degenerative type uptake along the ankles and feet. No additional areas of abnormal radiotracer uptake to suggest osseous metastatic disease. IMPRESSION: Multifocal degenerative type uptake. No scintigraphic evidence of osseous metastatic disease. Electronically Signed   By: Karen Kays M.D.   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 Pulmonary emboli. Staging. EXAM: MRI HEAD WITHOUT AND WITH CONTRAST TECHNIQUE: Multiplanar, multiecho pulse sequences of the brain and surrounding structures were obtained without and with intravenous contrast. CONTRAST:  6mL GADAVIST GADOBUTROL 1 MMOL/ML IV SOLN COMPARISON:  Recent CT Chest, Abdomen, and Pelvis 11/16/2022 and 11/17/2022. Head CT 08/30/2017. FINDINGS: Brain: No restricted diffusion to suggest acute infarction. No midline  shift, mass effect, evidence of mass lesion, ventriculomegaly, extra-axial collection or acute intracranial hemorrhage. Cervicomedullary junction and pituitary are within normal limits. No abnormal enhancement identified. No abnormal dural thickening or enhancement identified. Cerebral volume is within normal limits for age. Wallace Cullens and white matter signal is within normal limits for age throughout the brain. No cortical encephalomalacia or chronic cerebral blood products identified. Vascular: Major intracranial vascular flow voids are preserved. The major dural venous sinuses are enhancing and appear to be patent. Skull and upper cervical spine: Chronic cervical spine disc and endplate degeneration with degenerative appearing endplate marrow signal changes. Background bone marrow signal is within normal limits. No suspicious osseous lesion identified. Sinuses/Orbits: Postoperative changes to both globes, otherwise negative orbits. Trace paranasal sinus mucosal thickening. Other: Mastoids are clear. Visible internal auditory structures appear normal. Numerous small but benign appearing nasopharyngeal retention cysts, T2 hyperintense and nonenhancing following contrast. Negative visible scalp and face. IMPRESSION: No metastatic disease or acute intracranial abnormality. Normal for age MRI appearance of the brain. Electronically Signed   By: Odessa Fleming M.D.   On: 11/18/2022 07:30   CT ABDOMEN PELVIS W WO CONTRAST  Result Date: 11/17/2022 CLINICAL DATA:  Spiculated 2.4 cm left upper lobe lung nodule on recent chest CT, which also partially included a right adrenal mass concerning for the possibility of lung cancer with a metastatic lesion. Today's exam is for further characterization of the right adrenal mass. EXAM: CT ABDOMEN AND PELVIS WITHOUT AND WITH CONTRAST TECHNIQUE: Multidetector CT imaging of the abdomen and pelvis was performed following the standard protocol before and following the bolus administration of  intravenous contrast. RADIATION DOSE REDUCTION: This exam was performed according to the departmental dose-optimization program which includes automated exposure control, adjustment of the mA and/or kV according to patient size and/or use of iterative reconstruction technique. CONTRAST:  OMNIPAQUE IOHEXOL 300 MG/ML  SOLN COMPARISON:  Overlapping portions CT chest 11/16/2022 FINDINGS: Lower chest: Atelectasis noted along both lung bases, mildly increased in the right middle lobe compared to previous, and with some hazy non dependent peripheral opacity in the left lower lobe for example on image 12 series 4 similar to previous. Pneumonia is not excluded. Mild cardiomegaly is present. Descending thoracic aortic atherosclerosis. Hepatobiliary: Unremarkable Pancreas: Unremarkable Spleen: Unremarkable Adrenals/Urinary Tract: The left adrenal gland appears normal. 5.2 by 3.1 by 4.3 cm right adrenal mass demonstrates precontrast internal density of 33 Hounsfield units and a absolute washout of -58% and relative washout of -25%, both indeterminate. The lesion demonstrates a higher degree of enhancement peripherally rather than centrally, potentially reflecting some degree of central necrosis. Morphologically the lesion is suspicious and concerning for potential malignancy and there is some faint stranding along its inferior margin. A 1.2 cm simple cyst of the right kidney upper pole is present on image 49 series 2. No further imaging workup of this lesion is indicated. Mild scarring in the left kidney noted. Stomach/Bowel: Unremarkable Vascular/Lymphatic: Atherosclerosis is present, including aortoiliac atherosclerotic disease. There is substantial atheromatous calcification in the superior mesenteric artery, without overt occlusion. Mild atheromatous  plaque proximally in the celiac trunk without occlusion. No pathologic adenopathy. Reproductive: Uterus absent.  Adnexa unremarkable. Other: No supplemental  non-categorized findings. Musculoskeletal: Dextroconvex lumbar scoliosis with rotary component. Grade 1 degenerative anterolisthesis at L2-3 and L3-4 with substantial loss of disc height and endplate sclerosis at the L4-5 and L5-S1 levels. In conjunction with intervertebral and facet spurring as well as degenerative disc disease, there is substantial impingement at all levels between L2 and S1. Small bilateral groin hernias contain adipose tissue. IMPRESSION: 1. 5.2 cm right adrenal mass is suspicious for malignancy. Biopsy or other appropriate confirmation for example with PET-CT imaging would be suggested. 2. Aortic atherosclerosis. 3. Substantial impingement at all levels between L2 and S1 due to scoliosis, spondylosis, and degenerative disc disease. 4. Small bilateral groin hernias contain adipose tissue. 5. Mild cardiomegaly. 6. Atelectasis along both lung bases, mildly increased in the right middle lobe compared to previous, and with some hazy non dependent peripheral opacity in the left lower lobe similar to previous. Pneumonia is not excluded. Aortic Atherosclerosis (ICD10-I70.0). Electronically Signed   By: Gaylyn Rong M.D.   On: 11/17/2022 18:54   VAS [place] LOWER EXTREMITY VENOUS (DVT)  Result Date: 11/17/2022  Lower Venous DVT Study Patient Name:  SHAWNTEA WANTLAND  Date of Exam:   11/17/2022 Medical Rec #: [id number]         Accession #:    [id number] Date of Birth: [birth year]         Patient Gender: F Patient Age:   82 years Exam Location:  [facility] Procedure:      VAS [place] LOWER EXTREMITY VENOUS (DVT) Referring Phys: COURAGE EMOKPAE --------------------------------------------------------------------------------  Indications: Swelling.  Risk Factors: Confirmed PE. Anticoagulation: Heparin. Limitations: Poor ultrasound/tissue interface. Comparison Study: No prior studies. Performing Technologist: Chanda Busing RVT  Examination Guidelines: A complete evaluation includes B-mode imaging,  spectral Doppler, color Doppler, and power Doppler as needed of all accessible portions of each vessel. Bilateral testing is considered an integral part of a complete examination. Limited examinations for reoccurring indications may be performed as noted. The reflux portion of the exam is performed with the patient in reverse Trendelenburg.  +---------+---------------+---------+-----------+----------+--------------+ RIGHT    CompressibilityPhasicitySpontaneityPropertiesThrombus Aging +---------+---------------+---------+-----------+----------+--------------+ CFV      Full           Yes      Yes                                 +---------+---------------+---------+-----------+----------+--------------+ SFJ      Full                                                        +---------+---------------+---------+-----------+----------+--------------+ FV Prox  Full                                                        +---------+---------------+---------+-----------+----------+--------------+ FV Mid   Full                                                        +---------+---------------+---------+-----------+----------+--------------+  FV DistalFull                                                        +---------+---------------+---------+-----------+----------+--------------+ PFV      Full                                                        +---------+---------------+---------+-----------+----------+--------------+ POP      Full           Yes      Yes                                 +---------+---------------+---------+-----------+----------+--------------+ PTV      Full                                                        +---------+---------------+---------+-----------+----------+--------------+ PERO     Full                                                        +---------+---------------+---------+-----------+----------+--------------+    +---------+---------------+---------+-----------+----------+--------------+ LEFT     CompressibilityPhasicitySpontaneityPropertiesThrombus Aging +---------+---------------+---------+-----------+----------+--------------+ CFV      Full           Yes      Yes                                 +---------+---------------+---------+-----------+----------+--------------+ SFJ      Full                                                        +---------+---------------+---------+-----------+----------+--------------+ FV Prox  Full                                                        +---------+---------------+---------+-----------+----------+--------------+ FV Mid   Full                                                        +---------+---------------+---------+-----------+----------+--------------+ FV DistalFull                                                        +---------+---------------+---------+-----------+----------+--------------+  PFV      Full                                                        +---------+---------------+---------+-----------+----------+--------------+ POP      Full           Yes      Yes                                 +---------+---------------+---------+-----------+----------+--------------+ PTV      Full                                                        +---------+---------------+---------+-----------+----------+--------------+ PERO     Full                                                        +---------+---------------+---------+-----------+----------+--------------+     Summary: RIGHT: - There is no evidence of deep vein thrombosis in the lower extremity.  - No cystic structure found in the popliteal fossa.  LEFT: - There is no evidence of deep vein thrombosis in the lower extremity.  - No cystic structure found in the popliteal fossa.  *See table(s) above for measurements and observations. Electronically signed  by Lemar Livings MD on 11/17/2022 at 11:04:10 AM.    Final    ECHOCARDIOGRAM COMPLETE  Result Date: 11/16/2022    ECHOCARDIOGRAM REPORT   Patient Name:   MAESYN MOLLOY Date of Exam: 11/16/2022 Medical Rec #:  [id number]        Height:       64.0 in Accession #:    [id number]       Weight:       140.0 lb Date of Birth:  03-19-1951        BSA:          1.681 m Patient Age:    71 years         BP:           158/115 mmHg Patient Gender: F                HR:           78 bpm. [facility] Procedure: 2D Echo, Cardiac Doppler and Color Doppler Indications:    Pulmonary Embolus I26.09  History:        Patient has prior history of Echocardiogram examinations, most                 recent 11/16/2001. Signs/Symptoms:Murmur; Risk                 Factors:Hypertension.  Sonographer:    Aron Baba Referring Phys: [id number] COURAGE EMOKPAE IMPRESSIONS  1. Left ventricular ejection fraction, by estimation, is 30 to 35%. The left ventricle has moderately decreased function. The left ventricle demonstrates global hypokinesis. Left ventricular diastolic parameters are indeterminate.  2. Right ventricular systolic function is  normal. The right ventricular size is normal.  3. Left atrial size was mildly dilated.  4. A small pericardial effusion is present. The pericardial effusion is circumferential.  5. The mitral valve is normal in structure. No evidence of mitral valve regurgitation. No evidence of mitral stenosis.  6. The aortic valve was not well visualized. Aortic valve regurgitation is mild to moderate. FINDINGS  Left Ventricle: Left ventricular ejection fraction, by estimation, is 30 to 35%. The left ventricle has moderately decreased function. The left ventricle demonstrates global hypokinesis. Definity contrast agent was given IV to delineate the left ventricular endocardial borders. The left ventricular internal cavity size was normal in size. There is no left ventricular hypertrophy. Left ventricular diastolic  parameters are indeterminate. Right Ventricle: The right ventricular size is normal. Right vetricular wall thickness was not well visualized. Right ventricular systolic function is normal. Left Atrium: Left atrial size was mildly dilated. Right Atrium: Right atrial size was normal in size. Pericardium: A small pericardial effusion is present. The pericardial effusion is circumferential. Mitral Valve: The mitral valve is normal in structure. No evidence of mitral valve regurgitation. No evidence of mitral valve stenosis. Tricuspid Valve: The tricuspid valve is normal in structure. Tricuspid valve regurgitation is mild . No evidence of tricuspid stenosis. Aortic Valve: The aortic valve was not well visualized. Aortic valve regurgitation is mild to moderate. Aortic regurgitation PHT measures 442 msec. Aortic valve mean gradient measures 8.0 mmHg. Aortic valve peak gradient measures 16.6 mmHg. Aortic valve area, by VTI measures 1.49 cm. Pulmonic Valve: The pulmonic valve was not well visualized. Pulmonic valve regurgitation is not visualized. No evidence of pulmonic stenosis. Aorta: The aortic root is normal in size and structure. IAS/Shunts: The interatrial septum was not well visualized.  LEFT VENTRICLE PLAX 2D LVIDd:         4.45 cm   Diastology LVIDs:         4.00 cm   LV e' medial:    4.99 cm/s LV PW:         1.20 cm   LV E/e' medial:  38.5 LV IVS:        0.90 cm   LV e' lateral:   5.43 cm/s LVOT diam:     2.00 cm   LV E/e' lateral: 35.4 LV SV:         53 LV SV Index:   32 LVOT Area:     3.14 cm  RIGHT VENTRICLE RV S prime:     13.10 cm/s TAPSE (M-mode): 1.6 cm LEFT ATRIUM           Index        RIGHT ATRIUM           Index LA diam:      2.70 cm 1.61 cm/m   RA Area:     14.00 cm LA Vol (A2C): 36.5 ml 21.71 ml/m  RA Volume:   34.70 ml  20.64 ml/m LA Vol (A4C): 58.3 ml 34.68 ml/m  AORTIC VALVE                     PULMONIC VALVE AV Area (Vmax):    1.57 cm      PR End Diast Vel: 5.20 msec AV Area (Vmean):    1.47 cm AV Area (VTI):     1.49 cm AV Vmax:           204.00 cm/s AV Vmean:          128.000  cm/s AV VTI:            0.357 m AV Peak Grad:      16.6 mmHg AV Mean Grad:      8.0 mmHg LVOT Vmax:         102.19 cm/s LVOT Vmean:        59.811 cm/s LVOT VTI:          0.170 m LVOT/AV VTI ratio: 0.48 AI PHT:            442 msec  AORTA Ao Root diam: 3.00 cm Ao Asc diam:  3.10 cm MITRAL VALVE                TRICUSPID VALVE MV Area (PHT): 9.25 cm     TR Peak grad:   23.8 mmHg MV Decel Time: 82 msec      TR Vmax:        244.00 cm/s MR Peak grad: 7.7 mmHg MR Vmax:      139.00 cm/s   SHUNTS MV E velocity: 192.00 cm/s  Systemic VTI:  0.17 m MV A velocity: 48.40 cm/s   Systemic Diam: 2.00 cm MV E/A ratio:  3.97 Dina Rich MD Electronically signed by Dina Rich MD Signature Date/Time: 11/16/2022/3:04:20 PM    Final    CT Angio Chest Pulmonary Embolism (PE) W or WO Contrast  Result Date: 11/16/2022 CLINICAL DATA:  Chest pain EXAM: CT ANGIOGRAPHY CHEST WITH CONTRAST TECHNIQUE: Multidetector CT imaging of the chest was performed using the standard protocol during bolus administration of intravenous contrast. Multiplanar CT image reconstructions and MIPs were obtained to evaluate the vascular anatomy. RADIATION DOSE REDUCTION: This exam was performed according to the departmental dose-optimization program which includes automated exposure control, adjustment of the mA and/or kV according to patient size and/or use of iterative reconstruction technique. CONTRAST:  75mL OMNIPAQUE IOHEXOL 350 MG/ML SOLN COMPARISON:  X-ray 11/16/2022 FINDINGS: Cardiovascular: Heart is mildly enlarged. Small pericardial effusion. Coronary artery calcifications are seen. The thoracic aorta has a normal course and caliber with scattered atherosclerotic plaque. Multiple areas of pulmonary emboli identified most striking in segmental branches in the right upper lobe such as axial series 4, image 43. Additional subtle area in the anterior left  lower lobe on image 54. No large or central embolus. No signs of right heart strain at this time. Mediastinum/Nodes: Patulous esophagus. Small thyroid gland. No specific abnormal lymph node enlargement identified in the axillary regions, hilum or mediastinum. Lungs/Pleura: Centrilobular emphysematous changes are seen greatest in the upper lung zones. There is spiculated left upper lobe mass near the margin of the hilum and mediastinum measuring on series 6, image 49 at 2.5 by 2.1 cm. There is some linear opacity lung bases likely scar or atelectasis. Subtle opacity as well in the left lower lobe. Trace left pleural fluid. No pneumothorax. Breathing motion. Additional nodule in the lingula on series 6 image 86 measuring 7 mm. Upper Abdomen: Right adrenal mass identified measuring 4.5 by 2.9 cm. Nodular thickening of the left adrenal gland as well. Musculoskeletal: Degenerative changes seen along the spine. Critical Value/emergent results were called by telephone at the time of interpretation on 11/16/2022 at 7:48 am to provider Vonita Moss , who verbally acknowledged these results. Review of the MIP images confirms the above findings. IMPRESSION: Segmental pulmonary emboli right upper lobe greater than left lower lobe. Small amount of clot burden. Mildly enlarged heart with a small pericardial effusion. Emphysematous lung changes are seen with a spiculated left  upper lobe mass measuring 2.5 cm. Separate 7 mm lingular lesion. Right adrenal mass identified at the edge of the imaging field. Malignant or metastatic lesion is possible. Recommend overall further evaluation. PET-CT scan may be useful as the next step in the workup versus sampling. Tiny left pleural effusion Aortic Atherosclerosis (ICD10-I70.0) and Emphysema (ICD10-J43.9). Electronically Signed   By: Karen Kays M.D.   On: 11/16/2022 10:52   DG Chest 2 View  Result Date: 11/16/2022 CLINICAL DATA:  chest pain EXAM: CHEST - 2 VIEW COMPARISON:  None  Available. FINDINGS: No pleural effusion. No pneumothorax. No focal airspace opacity. Normal cardiac and mediastinal contours. No radiographically apparent displaced rib fractures. Visualized upper abdomen is unremarkable. Vertebral body heights are maintained. IMPRESSION: No focal airspace opacity. Electronically Signed   By: Lorenza Cambridge M.D.   On: 11/16/2022 09:21     ASSESSMENT AND PLAN: Adenocarcinoma of unclear origin Constipation IDA - Check LFTs. Patient will get tumor markers checked with oncology on 7/2 - EGD at 9:30 AM at [facility] on Eliquis due to her acute PE and EF of 30-35%. Will discuss with her oncologist who they will establish with on 7/2 to see if they would be open to doing a Lovenox bridge. If not willing to do Lovenox bridge, then plan to proceed with the procedure on Eliquis - MRI abdomen/MRCP w/contrast - Cont Miralax QD  Eulah Pont, MD

## 2022-12-02 NOTE — Patient Instructions (Signed)
Your provider has requested that you go to the basement level for lab work before leaving today. Press "B" on the elevator. The lab is located at the first door on the left as you exit the elevator.   You have been scheduled for an MRI at [facility], 1st floor, Radiology. Your appointment is scheduled on 12/04/22 at 9 am Please arrive 30 minutes prior to your appointment time for registration purposes. Please make certain not to have anything to eat or drink 6 hours prior to your test. In addition, if you have any metal in your body, have a pacemaker or defibrillator, please be sure to let your ordering physician know. This test typically takes 45 minutes to 1 hour to complete. Should you need to reschedule, please call [phone].   You have been scheduled for an endoscopy. Please follow written instructions given to you at your visit today. If you use inhalers (even only as needed), please bring them with you on the day of your procedure.   If your blood pressure at your visit was 140/90 or greater, please contact your primary care physician to follow up on this.  _______________________________________________________  If you are age 57 or older, your body mass index should be between 23-30. Your There is no height or weight on file to calculate BMI. If this is out of the aforementioned range listed, please consider follow up with your Primary Care Provider.  If you are age 70 or younger, your body mass index should be between 19-25. Your There is no height or weight on file to calculate BMI. If this is out of the aformentioned range listed, please consider follow up with your Primary Care Provider.   ________________________________________________________  The [facility] providers would like to encourage you to use [facility] to communicate with providers for non-urgent requests or questions.  Due to long hold times on the telephone, sending your provider a message by [facility] may be a  faster and more efficient way to get a response.  Please allow 48 business hours for a response.  Please remember that this is for non-urgent requests.   Due to recent changes in healthcare laws, you may see the results of your imaging and laboratory studies on MyChart before your provider has had a chance to review them.  We understand that in some cases there may be results that are confusing or concerning to you. Not all laboratory results come back in the same time frame and the provider may be waiting for multiple results in order to interpret others.  Please give [place] 48 hours in order for your provider to thoroughly review all the results before contacting the office for clarification of your results.    Thank you for entrusting me with your care and for choosing [facility], Dr. Eulah Pont

## 2022-12-02 NOTE — Progress Notes (Signed)
Chief Complaint: Adenocarcinoma, consideration of EGD  HPI : 72 year old female with history of right adrenal mass with adenocarcinoma, LUL mass, HFrEF (EF 30-35%), pulmonary embolism on Eliquis, COPD, and IDA presents with adenocarcinoma and for consideration of EGD   Patient was recently hospitalized 6/12-6/22/24 for abdominal pain and cough, found to have a PE. On imaging she was also noted to have a LUL lung mass and a right adrenal mass. Her TTE showed EF of 30-35% during that hospitalization. She was initially treated with heparin gtt and then transitioned to Eliquis prior to discharge. Radiology performed a CT guided biopsy of the right adrenal mass 6/19 that showed adenocarcinoma, favored to be of upper GI versus pancreaticobiliary origin. Currently patient denies abdominal pain, melena, hematochezia, weight loss, N&V, dysphagia, odynophagia, chest burning, or regurgitation. Denies prior EGD or colonoscopy. Her last Cologuard test was in 06/2022 that was negative. Mother had ovarian cancer. Brother had lung cancer. She has had a cough for the last year. She does chronically have constipation. She is usually uses Miralax every day. She has one BM once every 2-4 days. Denies personal history of pancreatits or liver issues.  Wt Readings from Last 3 Encounters:  12/02/22 134 lb (60.8 kg)  11/26/22 136 lb 14.4 oz (62.1 kg)  04/09/21 144 lb (65.3 kg)   Past Medical History:  Diagnosis Date   Arthritis    Cancer (HCC)    Hypertension    Tobacco abuse    Past Surgical History:  Procedure Laterality Date   ABDOMINAL HYSTERECTOMY     BILATERAL OOPHORECTOMY     CATARACT EXTRACTION W/PHACO  12/27/2010   Procedure: CATARACT EXTRACTION PHACO AND INTRAOCULAR LENS PLACEMENT (IOC);  Surgeon: Gemma Payor;  Location: [facility];  Service: Ophthalmology;  Laterality: Right;   CATARACT EXTRACTION W/PHACO  03/28/2011   Procedure: CATARACT EXTRACTION PHACO AND INTRAOCULAR LENS PLACEMENT (IOC);  Surgeon: Gemma Payor;  Location: [facility];  Service: Ophthalmology;  Laterality: Left;  CDE 7.27   Family History  Problem Relation Age of Onset   Ovarian cancer Mother    High blood pressure Mother    Liver disease Neg Hx    Esophageal cancer Neg Hx    Colon cancer Neg Hx    Social History   Tobacco Use   Smoking status: Every Day    Packs/day: 0.50    Years: 30.00    Additional pack years: 0.00    Total pack years: 15.00    Types: Cigarettes   Smokeless tobacco: Never  Vaping Use   Vaping Use: Never used  Substance Use Topics   Alcohol use: No   Drug use: No   Current Outpatient Medications  Medication Sig Dispense Refill   acetaminophen (TYLENOL) 325 MG tablet Take 2 tablets (650 mg total) by mouth every 6 (six) hours as needed for mild pain (or Fever >/= 101).     apixaban (ELIQUIS) 5 MG TABS tablet Take 1 tablet (5 mg total) by mouth 2 (two) times daily. 60 tablet 2   dexamethasone (DECADRON) 4 MG tablet Take 4 mg by mouth daily as needed. For arthritis pain     fluticasone furoate-vilanterol (BREO ELLIPTA) 100-25 MCG/ACT AEPB Inhale 1 puff into the lungs daily. 28 each 0   losartan (COZAAR) 25 MG tablet Take 1 tablet (25 mg total) by mouth daily. 30 tablet 1   metoprolol succinate (TOPROL-XL) 50 MG 24 hr tablet Take 1 tablet (50 mg total) by mouth daily. Take with or  immediately following a meal. 30 tablet 1   Multiple Vitamin (MULTIVITAMIN) tablet Take 1 tablet by mouth daily.       oxyCODONE (OXY IR/ROXICODONE) 5 MG immediate release tablet Take 1 tablet (5 mg total) by mouth every 6 (six) hours as needed for moderate pain. 20 tablet 0   Polyethyl Glycol-Propyl Glycol (SYSTANE OP) Apply 1 drop to eye daily as needed (dry eye). Dry Eyes     spironolactone (ALDACTONE) 25 MG tablet Take 0.5 tablets (12.5 mg total) by mouth daily. 30 tablet 1   No current facility-administered medications for this visit.   No Known Allergies  Review of Systems: All systems reviewed and negative except  where noted in HPI.   Physical Exam:    12/02/2022    8:30 AM 11/26/2022   10:20 AM 11/26/2022    5:22 AM  Vitals with BMI  Height 5\' 3"     Weight 134 lbs    BMI 23.74    Systolic 122 144 161  Diastolic 62 86 62  Pulse 68 72 66   Constitutional: Pleasant,well-developed, female in no acute distress. HEENT: Normocephalic and atraumatic. Conjunctivae are normal. No scleral icterus. Cardiovascular: Normal rate, regular rhythm.  Pulmonary/chest: Effort normal and breath sounds normal. No wheezing, rales or rhonchi. Abdominal: Soft, nondistended, nontender. Bowel sounds active throughout. There are no masses palpable. No hepatomegaly. Extremities: No edema Neurological: Alert and oriented to person place and time. Skin: Skin is warm and dry. No rashes noted. Psychiatric: Normal mood and affect. Behavior is normal.  Labs 11/2022: CBC with elevated WBC of 12.6 and low Hb of 9.5. BMP unremarkable. FOBT negative.   CT A/P w/contrast 11/17/22: IMPRESSION: 1. 5.2 cm right adrenal mass is suspicious for malignancy. Biopsy or other appropriate confirmation for example with PET-CT imaging would be suggested. 2. Aortic atherosclerosis. 3. Substantial impingement at all levels between L2 and S1 due to scoliosis, spondylosis, and degenerative disc disease. 4. Small bilateral groin hernias contain adipose tissue. 5. Mild cardiomegaly. 6. Atelectasis along both lung bases, mildly increased in the right middle lobe compared to previous, and with some hazy non dependent peripheral opacity in the left lower lobe similar to previous. Pneumonia is not excluded.  ASSESSMENT AND PLAN: Adenocarcinoma, favored to be of upper GI versus pancreaticobiliary origin IDA Constipation Patient presents with recently diagnosed adenocarcinoma (based upon a right adrenal mass biopsy), favored on pathology to be of upper GI versus pancreaticobiliary origin. Will initiate work up with EGD and MRI abd/MRCP to look for  a primary source of her adenocarcinoma. Her EGD will need to be done in the hospital due to her recently diagnosed HFrEF with EF of 30-35%. She is on Eliquis therapy for an recent PE and thus ideally she will need to be bridged with Lovenox therapy. However during my discussion with the patient and the patient's daughter today, they are hesitant to do a Lovenox bridge. Will discuss with oncology and see if the patient and her daughter may be more open to Lovenox bridge after she establishes with oncology next week on 7/2. - Check LFTs. Patient will get tumor markers CEA and CA19-9 checked with oncology on 7/2 - EGD at 9:30 AM on 7/5 at [facility] due to EF of 30-35%. Will discuss with her oncologist who they will establish with on 7/2 to see if they would be open to doing a Lovenox bridge. If not willing to do Lovenox bridge, then plan to proceed with the procedure on Eliquis due to the  patient's recent PE. - MRI abdomen/MRCP w/contrast - Cont Miralax every day  Eulah Pont, MD  I spent 62 minutes of time, including in depth chart review, independent review of results as outlined above, communicating results with the patient directly, face-to-face time with the patient, coordinating care, ordering studies and medications as appropriate, and documentation.

## 2022-12-02 NOTE — Patient Instructions (Addendum)
You have been scheduled for an MRI at [facility] on 12/04/22. Your appointment time is 9 am. Please arrive to admitting (at main entrance of the hospital) 30 minutes prior to your appointment time for registration purposes. Please make certain not to have anything to eat or drink 6 hours prior to your test. In addition, if you have any metal in your body, have a pacemaker or defibrillator, please be sure to let your ordering physician know. This test typically takes 45 minutes to 1 hour to complete. Should you need to reschedule, please call [phone] to do so.   You have been scheduled for an endoscopy. Please follow written instructions given to you at your visit today. If you use inhalers (even only as needed), please bring them with you on the day of your procedure.   Your provider has requested that you go to the basement level for lab work before leaving today. Press "B" on the elevator. The lab is located at the first door on the left as you exit the elevator.   Continue taking Miralax daily  _______________________________________________________  If your blood pressure at your visit was 140/90 or greater, please contact your primary care physician to follow up on this.  _______________________________________________________  If you are age 44 or older, your body mass index should be between 23-30. Your Body mass index is 23.74 kg/m. If this is out of the aforementioned range listed, please consider follow up with your Primary Care Provider.  If you are age 50 or younger, your body mass index should be between 19-25. Your Body mass index is 23.74 kg/m. If this is out of the aformentioned range listed, please consider follow up with your Primary Care Provider.   ________________________________________________________  The [facility] providers would like to encourage you to use [facility] to communicate with providers for non-urgent requests or questions.  Due to long hold  times on the telephone, sending your provider a message by [facility] may be a faster and more efficient way to get a response.  Please allow 48 business hours for a response.  Please remember that this is for non-urgent requests.  _______________________________________________________   Due to recent changes in healthcare laws, you may see the results of your imaging and laboratory studies on MyChart before your provider has had a chance to review them.  We understand that in some cases there may be results that are confusing or concerning to you. Not all laboratory results come back in the same time frame and the provider may be waiting for multiple results in order to interpret others.  Please give [place] 48 hours in order for your provider to thoroughly review all the results before contacting the office for clarification of your results.    Thank you for entrusting me with your care and for choosing [facility], Dr. Eulah Pont

## 2022-12-04 ENCOUNTER — Ambulatory Visit (HOSPITAL_COMMUNITY)
Admission: RE | Admit: 2022-12-04 | Discharge: 2022-12-04 | Disposition: A | Discharge status: Home / Self Care | Payer: Medicare HMO | Source: Ambulatory Visit | Attending: Internal Medicine | Admitting: Internal Medicine

## 2022-12-04 ENCOUNTER — Other Ambulatory Visit: Payer: Self-pay | Admitting: Internal Medicine

## 2022-12-04 DIAGNOSIS — C801 Malignant (primary) neoplasm, unspecified: Secondary | ICD-10-CM | POA: Diagnosis not present

## 2022-12-04 DIAGNOSIS — R1900 Intra-abdominal and pelvic swelling, mass and lump, unspecified site: Secondary | ICD-10-CM | POA: Diagnosis not present

## 2022-12-04 DIAGNOSIS — K859 Acute pancreatitis without necrosis or infection, unspecified: Secondary | ICD-10-CM | POA: Diagnosis not present

## 2022-12-04 DIAGNOSIS — C3490 Malignant neoplasm of unspecified part of unspecified bronchus or lung: Secondary | ICD-10-CM | POA: Diagnosis not present

## 2022-12-04 MED ORDER — GADOBUTROL 1 MMOL/ML IV SOLN
6.0000 mL | Freq: Once | INTRAVENOUS | Status: AC | PRN
  Administered 2022-12-04: 6 mL via INTRAVENOUS

## 2022-12-05 NOTE — Progress Notes (Signed)
Opened in error

## 2022-12-06 ENCOUNTER — Inpatient Hospital Stay: Payer: Medicare HMO | Attending: Physician Assistant | Admitting: Physician Assistant

## 2022-12-06 ENCOUNTER — Other Ambulatory Visit: Payer: Self-pay

## 2022-12-06 ENCOUNTER — Encounter: Payer: Self-pay | Admitting: Physician Assistant

## 2022-12-06 ENCOUNTER — Inpatient Hospital Stay: Payer: Medicare HMO

## 2022-12-06 VITALS — BP 135/51 | HR 71 | Temp 98.9°F | Resp 16 | Ht 63.0 in | Wt 135.5 lb

## 2022-12-06 DIAGNOSIS — C801 Malignant (primary) neoplasm, unspecified: Secondary | ICD-10-CM | POA: Diagnosis not present

## 2022-12-06 DIAGNOSIS — C3400 Malignant neoplasm of unspecified main bronchus: Secondary | ICD-10-CM | POA: Diagnosis not present

## 2022-12-06 DIAGNOSIS — C7971 Secondary malignant neoplasm of right adrenal gland: Secondary | ICD-10-CM

## 2022-12-06 DIAGNOSIS — E278 Other specified disorders of adrenal gland: Secondary | ICD-10-CM

## 2022-12-06 DIAGNOSIS — R978 Other abnormal tumor markers: Secondary | ICD-10-CM | POA: Diagnosis not present

## 2022-12-06 LAB — CMP (CANCER CENTER ONLY)
ALT: 16 U/L (ref 0–44)
AST: 12 U/L — ABNORMAL LOW (ref 15–41)
Albumin: 3.2 g/dL — ABNORMAL LOW (ref 3.5–5.0)
Alkaline Phosphatase: 69 U/L (ref 38–126)
Anion gap: 8 (ref 5–15)
BUN: 12 mg/dL (ref 8–23)
CO2: 25 mmol/L (ref 22–32)
Calcium: 9.3 mg/dL (ref 8.9–10.3)
Chloride: 107 mmol/L (ref 98–111)
Creatinine: 0.9 mg/dL (ref 0.44–1.00)
GFR, Estimated: >60 mL/min (ref >60)
Glucose, Bld: 139 mg/dL — ABNORMAL HIGH (ref 70–99)
Potassium: 3.6 mmol/L (ref 3.5–5.1)
Sodium: 140 mmol/L (ref 135–145)
Total Bilirubin: 0.3 mg/dL (ref 0.3–1.2)
Total Protein: 7.2 g/dL (ref 6.5–8.1)

## 2022-12-06 LAB — CBC WITH DIFFERENTIAL (CANCER CENTER ONLY)
Abs Immature Granulocytes: 0.05 10*3/uL (ref 0.00–0.07)
Basophils Absolute: 0.1 10*3/uL (ref 0.0–0.1)
Basophils Relative: 1 %
Eosinophils Absolute: 0.2 10*3/uL (ref 0.0–0.5)
Eosinophils Relative: 2 %
HCT: 29.5 % — ABNORMAL LOW (ref 36.0–46.0)
Hemoglobin: 9.7 g/dL — ABNORMAL LOW (ref 12.0–15.0)
Immature Granulocytes: 1 %
Lymphocytes Relative: 28 %
Lymphs Abs: 2.9 10*3/uL (ref 0.7–4.0)
MCH: 29.3 pg (ref 26.0–34.0)
MCHC: 32.9 g/dL (ref 30.0–36.0)
MCV: 89.1 fL (ref 80.0–100.0)
Monocytes Absolute: 0.8 10*3/uL (ref 0.1–1.0)
Monocytes Relative: 8 %
Neutro Abs: 6.2 10*3/uL (ref 1.7–7.7)
Neutrophils Relative %: 60 %
Platelet Count: 346 10*3/uL (ref 150–400)
RBC: 3.31 MIL/uL — ABNORMAL LOW (ref 3.87–5.11)
RDW: 16.3 % — ABNORMAL HIGH (ref 11.5–15.5)
WBC Count: 10.2 10*3/uL (ref 4.0–10.5)
nRBC: 0 % (ref 0.0–0.2)

## 2022-12-06 MED ORDER — ENOXAPARIN SODIUM 60 MG/0.6ML IJ SOSY: 60.0000 mg | PREFILLED_SYRINGE | Freq: Two times a day (BID) | INTRAMUSCULAR | 0 refills | Status: DC

## 2022-12-06 NOTE — Patient Instructions (Signed)
Regarding your upcoming endoscopy scheduled for 12/09/2022.  Please hold Eliquis starting 12/07/2022. You can resume the evening of 12/09/2022 Please bridge with Lovenox injection every 12 hours starting 12/06/2021. Please hold Lovenox injection 12 hours before the endoscopy.

## 2022-12-06 NOTE — Progress Notes (Signed)
I left a vm for [name] letting her know her mother needs to arrive at [facility] by [phone] on the day of her procedure , 7/5.

## 2022-12-07 ENCOUNTER — Encounter (HOSPITAL_COMMUNITY): Payer: Self-pay | Admitting: Hematology & Oncology

## 2022-12-07 LAB — CEA (ACCESS): CEA (CHCC): 6.24 ng/mL — ABNORMAL HIGH (ref 0.00–5.00)

## 2022-12-08 LAB — CANCER ANTIGEN 19-9: CA 19-9: 60 U/mL — ABNORMAL HIGH (ref 0–35)

## 2022-12-08 NOTE — Anesthesia Preprocedure Evaluation (Addendum)
Anesthesia Evaluation  Patient identified by MRN, date of birth, ID band Patient awake    Reviewed: Allergy & Precautions, NPO status , Patient's Chart, lab work & pertinent test results  History of Anesthesia Complications Negative for: history of anesthetic complications  Airway Mallampati: III  TM Distance: >3 FB Neck ROM: Full    Dental  (+) Edentulous Upper, Edentulous Lower, Dental Advisory Given   Pulmonary neg shortness of breath, neg sleep apnea, COPD (no recent flares), neg recent URI, former smoker, PE (multiple subsegmental, on Eliquis) Lung mass   Pulmonary exam normal breath sounds clear to auscultation       Cardiovascular hypertension (losartan, metoprolol, spironolactone), Pt. on medications and Pt. on home beta blockers (-) angina +CHF (EF 30-35%)  (-) Past MI, (-) Cardiac Stents and (-) CABG + dysrhythmias (LBBB) + Valvular Problems/Murmurs (mild-to-moderate) AI  Rhythm:Regular Rate:Normal  TTE 11/16/2022: IMPRESSIONS    1. Left ventricular ejection fraction, by estimation, is 30 to 35%. The  left ventricle has moderately decreased function. The left ventricle  demonstrates global hypokinesis. Left ventricular diastolic parameters are  indeterminate.   2. Right ventricular systolic function is normal. The right ventricular  size is normal.   3. Left atrial size was mildly dilated.   4. A small pericardial effusion is present. The pericardial effusion is  circumferential.   5. The mitral valve is normal in structure. No evidence of mitral valve  regurgitation. No evidence of mitral stenosis.   6. The aortic valve was not well visualized. Aortic valve regurgitation  is mild to moderate.     Neuro/Psych negative neurological ROS     GI/Hepatic negative GI ROS, Neg liver ROS,,,  Endo/Other  neg diabetes  Right adrenal mass  Renal/GU negative Renal ROS     Musculoskeletal  (+) Arthritis ,     Abdominal   Peds  Hematology  (+) Blood dyscrasia, anemia   Anesthesia Other Findings 72 year old female with history of right adrenal mass with adenocarcinoma, LUL mass, HFrEF (EF 30-35%), pulmonary embolism on Eliquis, COPD, and IDA presents with adenocarcinoma.   Patient was recently hospitalized 6/12-6/22/24 for abdominal pain and cough, found to have a PE. On imaging she was also noted to have a LUL lung mass and a right adrenal mass. Her TTE showed EF of 30-35% during that hospitalization. She was initially treated with heparin gtt and then transitioned to Eliquis prior to discharge. Radiology performed a CT guided biopsy of the right adrenal mass 6/19 that showed adenocarcinoma, favored to be of upper GI versus pancreaticobiliary origin.   Reproductive/Obstetrics                             Anesthesia Physical Anesthesia Plan  ASA: 4  Anesthesia Plan: MAC   Post-op Pain Management: Minimal or no pain anticipated   Induction: Intravenous  PONV Risk Score and Plan: 2 and Propofol infusion and Treatment may vary due to age or medical condition  Airway Management Planned: Natural Airway and Nasal Cannula  Additional Equipment:   Intra-op Plan:   Post-operative Plan:   Informed Consent: I have reviewed the patients History and Physical, chart, labs and discussed the procedure including the risks, benefits and alternatives for the proposed anesthesia with the patient or authorized representative who has indicated his/her understanding and acceptance.     Dental advisory given  Plan Discussed with: Anesthesiologist and CRNA  Anesthesia Plan Comments: (Discussed with patient risks  of MAC including, but not limited to, minor pain or discomfort, hearing people in the room, and possible need for backup general anesthesia. Risks for general anesthesia also discussed including, but not limited to, sore throat, hoarse voice, chipped/damaged teeth, injury to  vocal cords, nausea and vomiting, allergic reactions, lung infection, heart attack, stroke, and death. All questions answered. )       Anesthesia Quick Evaluation

## 2022-12-08 NOTE — Progress Notes (Signed)
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   ONCOLOGIC HISTORY: 11/16/2022-11/26/2022: Initially presented to ED with abdominal pain, cough with productive sputum. CTA chest showed segmental pulmonary emboli in the RUL greater than LUL along with spiculated left upper lobe mass measuring 2.5 cm and right adrenal mass. Patient was started on IV heparin and then transitioned to Eliquis therapy.  CT abdomen/pelvis showed 5.2 cm right adrenal mass is suspicious for malignancy.  MRI brain negative for metastatic disease or acute intracranial abnormality Bone scan negative for osseous metastatic disease Underwent CT guided biopsy of right adrenal mass. Pathology was consistent with metastatic well differentiated adenocarcinoma. IHC studies suggested upper GI or pancreatobiliary origin.  12/04/2022: MRCP: large right adrenal mass with evidence of hemorrhage within the mass which extends into the right perinephric space. 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 Cardiomegaly with left ventricular dilatation. 12/06/2022: Establish care with [facility] Hematology/Oncology  HISTORY OF PRESENTING ILLNESS:  Erin Pacheco 72 y.o. female with medical history significant for HFrEF (EF 30-35%), pulmonary embolism on Eliquis, COPD, and IDA presents with adenocarcinoma of the right adrenal gland with unknown primary. She is accompanied by her sister for this visit.   On exam today, [name] reports that she has persistent fatigue but still completes her daily activities. She reports her appetite is stable but has lost approximately 6 lbs since presenting to the hospital on 11/16/2022. She denies nausea, vomiting or abdominal  pain. She reports her bowel habits are unchanged without recurrent episodes of diarrhea or constipation. She does reports occasional episodes of low back pain. She reports shorntess of breath with exertion but none at rest. She denies fevers, chills, sweats, chest pain  or cough. She has no other complaints. Rest of the ROS is below.    MEDICAL HISTORY:  Past Medical History:  Diagnosis Date   Arthritis    Cancer (HCC)    Hypertension    Tobacco abuse     SURGICAL HISTORY: Past Surgical History:  Procedure Laterality Date   ABDOMINAL HYSTERECTOMY     BILATERAL OOPHORECTOMY     CATARACT EXTRACTION W/PHACO  12/27/2010   Procedure: CATARACT EXTRACTION PHACO AND INTRAOCULAR LENS PLACEMENT (IOC);  Surgeon: Gemma Payor;  Location: [facility];  Service: Ophthalmology;  Laterality: Right;   CATARACT EXTRACTION W/PHACO  03/28/2011   Procedure: CATARACT EXTRACTION PHACO AND INTRAOCULAR LENS PLACEMENT (IOC);  Surgeon: Gemma Payor;  Location: [facility];  Service: Ophthalmology;  Laterality: Left;  CDE 7.27    SOCIAL HISTORY: Social History   Socioeconomic History   Marital status: Single    Spouse name: Not on file   Number of children: Not on file   Years of education: Not on file   Highest education level: Not on file  Occupational History   Not on file  Tobacco Use   Smoking status: Former    Packs/day: 0.50    Years: 30.00    Additional pack years: 0.00    Total pack years: 15.00    Types: Cigarettes    Quit date: 11/2022    Years since quitting: 0.0   Smokeless tobacco: Never  Vaping Use   Vaping Use: Never used  Substance and Sexual Activity   Alcohol use: No   Drug  use: No   Sexual activity: Yes    Birth control/protection: Surgical  Other Topics Concern   Not on file  Social History Narrative   Not on file   Social Determinants of Health   Financial Resource Strain: Not on file  Food Insecurity: No Food Insecurity (11/17/2022)   Hunger Vital Sign    Worried About Running  Out of Food in the Last Year: Never true    Ran Out of Food in the Last Year: Never true  Transportation Needs: No Transportation Needs (11/17/2022)   PRAPARE - Administrator, Civil Service (Medical): No    Lack of Transportation (Non-Medical): No  Physical Activity: Not on file  Stress: Not on file  Social Connections: Not on file  Intimate Partner Violence: Not At Risk (11/17/2022)   Humiliation, Afraid, Rape, and Kick questionnaire    Fear of Current or Ex-Partner: No    Emotionally Abused: No    Physically Abused: No    Sexually Abused: No    FAMILY HISTORY: Family History  Problem Relation Age of Onset   Ovarian cancer Mother 70   High blood pressure Mother    Throat cancer Brother        smoker   Liver disease Neg Hx    Esophageal cancer Neg Hx    Colon cancer Neg Hx     ALLERGIES:  has No Known Allergies.  MEDICATIONS:  Current Outpatient Medications  Medication Sig Dispense Refill   enoxaparin (LOVENOX) 60 MG/0.6ML injection Inject 0.6 mLs (60 mg total) into the skin every 12 (twelve) hours for 2 days. Hold Lovenox 12 hours before EGD. 2.4 mL 0   acetaminophen (TYLENOL) 325 MG tablet Take 2 tablets (650 mg total) by mouth every 6 (six) hours as needed for mild pain (or Fever >/= 101).     apixaban (ELIQUIS) 5 MG TABS tablet Take 1 tablet (5 mg total) by mouth 2 (two) times daily. 60 tablet 2   Camphor-Menthol-Methyl Sal (SALONPAS) 3.06-11-08 % PTCH Place 1 patch onto the skin daily as needed (pain).     dexamethasone (DECADRON) 4 MG tablet Take 4 mg by mouth daily as needed (For arthritis pain).     fluticasone furoate-vilanterol (BREO ELLIPTA) 100-25 MCG/ACT AEPB Inhale 1 puff into the lungs daily. 28 each 0   losartan (COZAAR) 25 MG tablet Take 1 tablet (25 mg total) by mouth daily. 30 tablet 1   metoprolol succinate (TOPROL-XL) 50 MG 24 hr tablet Take 1 tablet (50 mg total) by mouth daily. Take with or immediately following a meal. 30 tablet 1   Multiple  Vitamin (MULTIVITAMIN) tablet Take 1 tablet by mouth daily.       oxyCODONE (OXY IR/ROXICODONE) 5 MG immediate release tablet Take 1 tablet (5 mg total) by mouth every 6 (six) hours as needed for moderate pain. 20 tablet 0   Polyethyl Glycol-Propyl Glycol (SYSTANE OP) Place 1 drop into both eyes daily as needed (dry eye).     spironolactone (ALDACTONE) 25 MG tablet Take 0.5 tablets (12.5 mg total) by mouth daily. 30 tablet 1   No current facility-administered medications for this visit.    REVIEW OF SYSTEMS:   Constitutional: ( - ) fevers, ( - )  chills , ( - ) night sweats Eyes: ( - ) blurriness of vision, ( - ) double vision, ( - ) watery eyes Ears, nose, mouth, throat, and face: ( - ) mucositis, ( - ) sore throat Respiratory: ( - )  cough, ( + ) dyspnea, ( - ) wheezes Cardiovascular: ( - ) palpitation, ( - ) chest discomfort, ( - ) lower extremity swelling Gastrointestinal:  ( - ) nausea, ( - ) heartburn, ( - ) change in bowel habits Skin: ( - ) abnormal skin rashes Lymphatics: ( - ) new lymphadenopathy, ( - ) easy bruising Neurological: ( - ) numbness, ( - ) tingling, ( - ) new weaknesses Behavioral/Psych: ( - ) mood change, ( - ) new changes  All other systems were reviewed with the patient and are negative.  PHYSICAL EXAMINATION: ECOG PERFORMANCE STATUS: 1 - Symptomatic but completely ambulatory  Vitals:   12/06/22 1509  BP: (!) 135/51  Pulse: 71  Resp: 16  Temp: 98.9 F (37.2 C)  SpO2: 97%   Filed Weights   12/06/22 1509  Weight: 135 lb 8 oz (61.5 kg)    GENERAL: well appearing female in NAD  SKIN: skin color, texture, turgor are normal, no rashes or significant lesions EYES: conjunctiva are pink and non-injected, sclera clear LYMPH:  no palpable lymphadenopathy in the cervical or supraclavicular lymph nodes.  LUNGS: clear to auscultation and percussion with normal breathing effort HEART: regular rate & rhythm and no murmurs and no lower extremity edema ABDOMEN:  soft, non-tender, non-distended, normal bowel sounds Musculoskeletal: no cyanosis of digits and no clubbing  PSYCH: alert & oriented x 3, fluent speech NEURO: no focal motor/sensory deficits  LABORATORY DATA:  I have reviewed the data as listed    Latest Ref Rng & Units 12/06/2022    2:54 PM 11/26/2022    4:35 AM 11/25/2022    4:09 AM  CBC  WBC 4.0 - 10.5 K/uL 10.2  13.0  12.6   Hemoglobin 12.0 - 15.0 g/dL 9.7  9.9  9.5   Hematocrit 36.0 - 46.0 % 29.5  30.6  28.5   Platelets 150 - 400 K/uL 346  446  436        Latest Ref Rng & Units 12/06/2022    2:54 PM 12/02/2022   10:30 AM 11/23/2022    4:17 AM  CMP  Glucose 70 - 99 mg/dL 782   956   BUN 8 - 23 mg/dL 12   12   Creatinine 2.13 - 1.00 mg/dL 0.86   5.78   Sodium 469 - 145 mmol/L 140   136   Potassium 3.5 - 5.1 mmol/L 3.6   3.9   Chloride 98 - 111 mmol/L 107   102   CO2 22 - 32 mmol/L 25   25   Calcium 8.9 - 10.3 mg/dL 9.3   9.1   Total Protein 6.5 - 8.1 g/dL 7.2  8.2    Total Bilirubin 0.3 - 1.2 mg/dL 0.3  0.4    Alkaline Phos 38 - 126 U/L 69  72    AST 15 - 41 U/L 12  12    ALT 0 - 44 U/L 16  18       PATHOLOGY: SURGICAL PATHOLOGY  CASE: [id number]  PATIENT: Erin Pacheco  Surgical Pathology Report   Clinical History: Indeterminate adrenal mass (crm)   FINAL MICROSCOPIC DIAGNOSIS:   A. ADRENAL MASS, RIGHT, BIOPSY:  -  Metastatic well differentiated adenocarcinoma.   Note: The biopsy consists of infiltrative mucin-producing glands with  the presence of goblet cells in a dense fibrous/desmoplastic stroma.  There is no residual adrenal gland present in the biopsy specimen.  The  cells are positive for CK7 and while focally weakly  positive for GATA3  this is considered nonspecific.  The remaining immunohistochemical  stains PAX8, TTF-1, CDX2 and CK20 are essentially negative.  This is a  nonspecific immunophenotype; however, the findings most likely represent  either an upper gastrointestinal or pancreatobiliary  origin.   RADIOGRAPHIC STUDIES: I have personally reviewed the radiological images as listed and agreed with the findings in the report. MR ABDOMEN MRCP W WO CONTAST  Result Date: 12/04/2022 CLINICAL DATA:  72 year old female with history of suspected pancreatitis. Right adrenal mass. History of lung cancer. EXAM: MRI ABDOMEN WITHOUT AND WITH CONTRAST (INCLUDING MRCP) TECHNIQUE: Multiplanar multisequence MR imaging of the abdomen was performed both before and after the administration of intravenous contrast. Heavily T2-weighted images of the biliary and pancreatic ducts were obtained, and three-dimensional MRCP images were rendered by post processing. CONTRAST:  6mL GADAVIST GADOBUTROL 1 MMOL/ML IV SOLN COMPARISON:  CT of the abdomen and pelvis 11/17/2022. No prior abdominal MRI. FINDINGS: Lower chest: Cardiomegaly with left ventricular dilatation Hepatobiliary: Diffuse loss of signal intensity on in phase dual echo images, and diffuse low T2 signal intensity throughout the hepatic parenchyma, indicative of severe hepatic iron deposition. No discrete cystic or solid hepatic lesions are noted. No intra or extrahepatic biliary ductal dilatation noted on MRCP images. Common bile duct measures 4 mm in the porta hepatis. No filling defect in the common bile duct to suggest choledocholithiasis. Pancreas: No pancreatic mass. No pancreatic ductal dilatation. No pancreatic or peripancreatic fluid collections or inflammatory changes. Spleen: Diffuse loss of signal intensity throughout the spleen on in phase dual echo images, and diffuse low T2 signal intensity indicative of severe splenic iron deposition. Otherwise, unremarkable in appearance. Adrenals/Urinary Tract: Large right adrenal mass (axial image 28 of series 21 and coronal image 42 of series 23) with heterogeneous signal intensity on T1 and T2 weighted images, including significant regions of high internal T1 signal intensity, likely reflective of internal  hemorrhage, with some T1 signal intensity which appears to extend from the mass into the right perinephric space posteriorly. Subtraction images demonstrate considerable areas of internal enhancement. 1.3 cm T1 hypointense, T2 hyperintense, nonenhancing lesion in the posterolateral aspect of the interpolar region of the right kidney, compatible with a simple (Bosniak class 1) cyst which requires no imaging follow-up. Mild cortical scarring in the anterior aspect of the lower pole of the left kidney. No suspicious renal lesions. Left adrenal gland is normal in appearance. No hydroureteronephrosis in the visualized portions of the abdomen. Stomach/Bowel: Visualized portions are unremarkable. Vascular/Lymphatic: No aneurysm identified in the visualized abdominal vasculature. No lymphadenopathy noted in the abdomen. Other: No significant volume of ascites noted in the visualized portions of the peritoneal cavity. Musculoskeletal: No aggressive appearing osseous lesions are noted in the visualized portions of the skeleton. IMPRESSION: 1. Large right adrenal mass which has imaging findings concerning for malignancy. There is also evidence of hemorrhage within the mass, which extends into the right perinephric space, as above. 2. 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 3. Cardiomegaly with left ventricular dilatation. Electronically Signed   By: Trudie Reed M.D.   On: 12/04/2022 10:23   MR 3D Recon At Scanner  Result Date: 12/04/2022 CLINICAL DATA:  72 year old female with history of suspected pancreatitis. Right adrenal mass. History of lung cancer. EXAM: MRI ABDOMEN WITHOUT AND WITH CONTRAST (INCLUDING MRCP) TECHNIQUE: Multiplanar multisequence MR imaging of the abdomen was performed both before and after the administration of intravenous contrast. Heavily T2-weighted images of the biliary and pancreatic ducts were  obtained, and three-dimensional MRCP images were rendered by post processing. CONTRAST:  6mL  GADAVIST GADOBUTROL 1 MMOL/ML IV SOLN COMPARISON:  CT of the abdomen and pelvis 11/17/2022. No prior abdominal MRI. FINDINGS: Lower chest: Cardiomegaly with left ventricular dilatation Hepatobiliary: Diffuse loss of signal intensity on in phase dual echo images, and diffuse low T2 signal intensity throughout the hepatic parenchyma, indicative of severe hepatic iron deposition. No discrete cystic or solid hepatic lesions are noted. No intra or extrahepatic biliary ductal dilatation noted on MRCP images. Common bile duct measures 4 mm in the porta hepatis. No filling defect in the common bile duct to suggest choledocholithiasis. Pancreas: No pancreatic mass. No pancreatic ductal dilatation. No pancreatic or peripancreatic fluid collections or inflammatory changes. Spleen: Diffuse loss of signal intensity throughout the spleen on in phase dual echo images, and diffuse low T2 signal intensity indicative of severe splenic iron deposition. Otherwise, unremarkable in appearance. Adrenals/Urinary Tract: Large right adrenal mass (axial image 28 of series 21 and coronal image 42 of series 23) with heterogeneous signal intensity on T1 and T2 weighted images, including significant regions of high internal T1 signal intensity, likely reflective of internal hemorrhage, with some T1 signal intensity which appears to extend from the mass into the right perinephric space posteriorly. Subtraction images demonstrate considerable areas of internal enhancement. 1.3 cm T1 hypointense, T2 hyperintense, nonenhancing lesion in the posterolateral aspect of the interpolar region of the right kidney, compatible with a simple (Bosniak class 1) cyst which requires no imaging follow-up. Mild cortical scarring in the anterior aspect of the lower pole of the left kidney. No suspicious renal lesions. Left adrenal gland is normal in appearance. No hydroureteronephrosis in the visualized portions of the abdomen. Stomach/Bowel: Visualized portions are  unremarkable. Vascular/Lymphatic: No aneurysm identified in the visualized abdominal vasculature. No lymphadenopathy noted in the abdomen. Other: No significant volume of ascites noted in the visualized portions of the peritoneal cavity. Musculoskeletal: No aggressive appearing osseous lesions are noted in the visualized portions of the skeleton. IMPRESSION: 1. Large right adrenal mass which has imaging findings concerning for malignancy. There is also evidence of hemorrhage within the mass, which extends into the right perinephric space, as above. 2. 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 3. Cardiomegaly with left ventricular dilatation. Electronically Signed   By: Trudie Reed M.D.   On: 12/04/2022 10:23   CT BIOPSY  Result Date: 11/23/2022 INDICATION: 72 year old female with history of indeterminate right adrenal mass, history of stage IV lung cancer. EXAM: CT BIOPSY COMPARISON:  None Available. MEDICATIONS: None. ANESTHESIA/SEDATION: Fentanyl 100 mcg IV; Versed 2 mg IV Sedation time: 10 minutes; The patient was continuously monitored during the procedure by the interventional radiology nurse under my direct supervision. CONTRAST:  None. COMPLICATIONS: None immediate. PROCEDURE: RADIATION DOSE REDUCTION: This exam was performed according to the departmental dose-optimization program which includes automated exposure control, adjustment of the mA and/or kV according to patient size and/or use of iterative reconstruction technique. Informed consent was obtained from the patient following an explanation of the procedure, risks, benefits and alternatives. A time out was performed prior to the initiation of the procedure. The patient was positioned in the right lateral decubitus position on the CT table and a limited CT was performed for procedural planning demonstrating similar appearing right adrenal mass. The procedure was planned. The operative site was prepped and draped in the usual sterile  fashion. Appropriate trajectory was confirmed with a 22 gauge spinal needle after the adjacent tissues were anesthetized  with 1% Lidocaine with epinephrine. Under intermittent CT guidance, a 17 gauge coaxial needle was advanced into the peripheral aspect of the mass. Appropriate positioning was confirmed and total of 2 samples were obtained with an 18 gauge core needle biopsy device. The co-axial needle was removed while administering Gel-Foam slurry along the needle track and superficial hemostasis was achieved with manual compression. A limited postprocedural CT was negative for hemorrhage or additional complication. A dressing was placed. The patient tolerated the procedure well without immediate postprocedural complication. IMPRESSION: Technically successful CT guided core needle biopsy of right adrenal mass. Marliss Coots, MD [facility] Electronically Signed   By: Marliss Coots M.D.   On: 11/23/2022 16:51   NM Bone Scan Whole Body  Result Date: 11/18/2022 CLINICAL DATA:  Lung cancer staging EXAM: NUCLEAR MEDICINE WHOLE BODY BONE SCAN TECHNIQUE: Whole body anterior and posterior images were obtained approximately 3 hours after intravenous injection of radiopharmaceutical. RADIOPHARMACEUTICALS:  22.0 mCi Technetium-57m MDP IV COMPARISON:  Chest CT 11/16/2022.  Abdomen pelvis CT 11/17/2022 FINDINGS: There is physiologic distribution radiotracer along the kidneys and bladder. Presumed injection site along the right antecubital region. Degenerative type areas of uptake seen along the left shoulder greater than right, midthoracic, lower lumbar spine and along the sacroiliac joint regions. Also degenerative type uptake along the ankles and feet. No additional areas of abnormal radiotracer uptake to suggest osseous metastatic disease. IMPRESSION: Multifocal degenerative type uptake. No scintigraphic evidence of osseous metastatic disease. Electronically  Signed   By: Karen Kays M.D.   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 Pulmonary emboli. Staging. EXAM: MRI HEAD WITHOUT AND WITH CONTRAST TECHNIQUE: Multiplanar, multiecho pulse sequences of the brain and surrounding structures were obtained without and with intravenous contrast. CONTRAST:  6mL GADAVIST GADOBUTROL 1 MMOL/ML IV SOLN COMPARISON:  Recent CT Chest, Abdomen, and Pelvis 11/16/2022 and 11/17/2022. Head CT 08/30/2017. FINDINGS: Brain: No restricted diffusion to suggest acute infarction. No midline shift, mass effect, evidence of mass lesion, ventriculomegaly, extra-axial collection or acute intracranial hemorrhage. Cervicomedullary junction and pituitary are within normal limits. No abnormal enhancement identified. No abnormal dural thickening or enhancement identified. Cerebral volume is within normal limits for age. Wallace Cullens and white matter signal is within normal limits for age throughout the brain. No cortical encephalomalacia or chronic cerebral blood products identified. Vascular: Major intracranial vascular flow voids are preserved. The major dural venous sinuses are enhancing and appear to be patent. Skull and upper cervical spine: Chronic cervical spine disc and endplate degeneration with degenerative appearing endplate marrow signal changes. Background bone marrow signal is within normal limits. No suspicious osseous lesion identified. Sinuses/Orbits: Postoperative changes to both globes, otherwise negative orbits. Trace paranasal sinus mucosal thickening. Other: Mastoids are clear. Visible internal auditory structures appear normal. Numerous small but benign appearing nasopharyngeal retention cysts, T2 hyperintense and nonenhancing following contrast. Negative visible scalp and face. IMPRESSION: No metastatic disease or acute intracranial abnormality. Normal for age MRI  appearance of the brain. Electronically Signed   By: Odessa Fleming M.D.   On: 11/18/2022 07:30   CT ABDOMEN PELVIS W WO CONTRAST  Result Date: 11/17/2022 CLINICAL DATA:  Spiculated 2.4 cm left upper lobe lung nodule on recent chest CT, which also partially included a right adrenal mass concerning for the possibility of lung cancer with a metastatic lesion. Today's exam is for further  characterization of the right adrenal mass. EXAM: CT ABDOMEN AND PELVIS WITHOUT AND WITH CONTRAST TECHNIQUE: Multidetector CT imaging of the abdomen and pelvis was performed following the standard protocol before and following the bolus administration of intravenous contrast. RADIATION DOSE REDUCTION: This exam was performed according to the departmental dose-optimization program which includes automated exposure control, adjustment of the mA and/or kV according to patient size and/or use of iterative reconstruction technique. CONTRAST:  OMNIPAQUE IOHEXOL 300 MG/ML  SOLN COMPARISON:  Overlapping portions CT chest 11/16/2022 FINDINGS: Lower chest: Atelectasis noted along both lung bases, mildly increased in the right middle lobe compared to previous, and with some hazy non dependent peripheral opacity in the left lower lobe for example on image 12 series 4 similar to previous. Pneumonia is not excluded. Mild cardiomegaly is present. Descending thoracic aortic atherosclerosis. Hepatobiliary: Unremarkable Pancreas: Unremarkable Spleen: Unremarkable Adrenals/Urinary Tract: The left adrenal gland appears normal. 5.2 by 3.1 by 4.3 cm right adrenal mass demonstrates precontrast internal density of 33 Hounsfield units and a absolute washout of -58% and relative washout of -25%, both indeterminate. The lesion demonstrates a higher degree of enhancement peripherally rather than centrally, potentially reflecting some degree of central necrosis. Morphologically the lesion is suspicious and concerning for potential malignancy and there is some  faint stranding along its inferior margin. A 1.2 cm simple cyst of the right kidney upper pole is present on image 49 series 2. No further imaging workup of this lesion is indicated. Mild scarring in the left kidney noted. Stomach/Bowel: Unremarkable Vascular/Lymphatic: Atherosclerosis is present, including aortoiliac atherosclerotic disease. There is substantial atheromatous calcification in the superior mesenteric artery, without overt occlusion. Mild atheromatous plaque proximally in the celiac trunk without occlusion. No pathologic adenopathy. Reproductive: Uterus absent.  Adnexa unremarkable. Other: No supplemental non-categorized findings. Musculoskeletal: Dextroconvex lumbar scoliosis with rotary component. Grade 1 degenerative anterolisthesis at L2-3 and L3-4 with substantial loss of disc height and endplate sclerosis at the L4-5 and L5-S1 levels. In conjunction with intervertebral and facet spurring as well as degenerative disc disease, there is substantial impingement at all levels between L2 and S1. Small bilateral groin hernias contain adipose tissue. IMPRESSION: 1. 5.2 cm right adrenal mass is suspicious for malignancy. Biopsy or other appropriate confirmation for example with PET-CT imaging would be suggested. 2. Aortic atherosclerosis. 3. Substantial impingement at all levels between L2 and S1 due to scoliosis, spondylosis, and degenerative disc disease. 4. Small bilateral groin hernias contain adipose tissue. 5. Mild cardiomegaly. 6. Atelectasis along both lung bases, mildly increased in the right middle lobe compared to previous, and with some hazy non dependent peripheral opacity in the left lower lobe similar to previous. Pneumonia is not excluded. Aortic Atherosclerosis (ICD10-I70.0). Electronically Signed   By: Gaylyn Rong M.D.   On: 11/17/2022 18:54   VAS [place] LOWER EXTREMITY VENOUS (DVT)  Result Date: 11/17/2022  Lower Venous DVT Study Patient Name:  Erin Pacheco  Date of Exam:    11/17/2022 Medical Rec #: [id number]         Accession #:    [id number] Date of Birth: 1950-07-18         Patient Gender: F Patient Age:   35 years Exam Location:  [facility] Procedure:      VAS [place] LOWER EXTREMITY VENOUS (DVT) Referring Phys: COURAGE EMOKPAE --------------------------------------------------------------------------------  Indications: Swelling.  Risk Factors: Confirmed PE. Anticoagulation: Heparin. Limitations: Poor ultrasound/tissue interface. Comparison Study: No prior studies. Performing Technologist: Chanda Busing RVT  Examination Guidelines: A complete  evaluation includes B-mode imaging, spectral Doppler, color Doppler, and power Doppler as needed of all accessible portions of each vessel. Bilateral testing is considered an integral part of a complete examination. Limited examinations for reoccurring indications may be performed as noted. The reflux portion of the exam is performed with the patient in reverse Trendelenburg.  +---------+---------------+---------+-----------+----------+--------------+ RIGHT    CompressibilityPhasicitySpontaneityPropertiesThrombus Aging +---------+---------------+---------+-----------+----------+--------------+ CFV      Full           Yes      Yes                                 +---------+---------------+---------+-----------+----------+--------------+ SFJ      Full                                                        +---------+---------------+---------+-----------+----------+--------------+ FV Prox  Full                                                        +---------+---------------+---------+-----------+----------+--------------+ FV Mid   Full                                                        +---------+---------------+---------+-----------+----------+--------------+ FV DistalFull                                                         +---------+---------------+---------+-----------+----------+--------------+ PFV      Full                                                        +---------+---------------+---------+-----------+----------+--------------+ POP      Full           Yes      Yes                                 +---------+---------------+---------+-----------+----------+--------------+ PTV      Full                                                        +---------+---------------+---------+-----------+----------+--------------+ PERO     Full                                                        +---------+---------------+---------+-----------+----------+--------------+   +---------+---------------+---------+-----------+----------+--------------+  LEFT     CompressibilityPhasicitySpontaneityPropertiesThrombus Aging +---------+---------------+---------+-----------+----------+--------------+ CFV      Full           Yes      Yes                                 +---------+---------------+---------+-----------+----------+--------------+ SFJ      Full                                                        +---------+---------------+---------+-----------+----------+--------------+ FV Prox  Full                                                        +---------+---------------+---------+-----------+----------+--------------+ FV Mid   Full                                                        +---------+---------------+---------+-----------+----------+--------------+ FV DistalFull                                                        +---------+---------------+---------+-----------+----------+--------------+ PFV      Full                                                        +---------+---------------+---------+-----------+----------+--------------+ POP      Full           Yes      Yes                                  +---------+---------------+---------+-----------+----------+--------------+ PTV      Full                                                        +---------+---------------+---------+-----------+----------+--------------+ PERO     Full                                                        +---------+---------------+---------+-----------+----------+--------------+     Summary: RIGHT: - There is no evidence of deep vein thrombosis in the lower extremity.  - No cystic structure found in the popliteal fossa.  LEFT: - There is no evidence of deep vein thrombosis in the lower extremity.  - No  cystic structure found in the popliteal fossa.  *See table(s) above for measurements and observations. Electronically signed by Lemar Livings MD on 11/17/2022 at 11:04:10 AM.    Final    ECHOCARDIOGRAM COMPLETE  Result Date: 11/16/2022    ECHOCARDIOGRAM REPORT   Patient Name:   Erin Pacheco Date of Exam: 11/16/2022 Medical Rec #:  [id number]        Height:       64.0 in Accession #:    [id number]       Weight:       140.0 lb Date of Birth:  09-11-1950        BSA:          1.681 m Patient Age:    71 years         BP:           158/115 mmHg Patient Gender: F                HR:           78 bpm. [facility] Procedure: 2D Echo, Cardiac Doppler and Color Doppler Indications:    Pulmonary Embolus I26.09  History:        Patient has prior history of Echocardiogram examinations, most                 recent 11/16/2001. Signs/Symptoms:Murmur; Risk                 Factors:Hypertension.  Sonographer:    Aron Baba Referring Phys: [id number] COURAGE EMOKPAE IMPRESSIONS  1. Left ventricular ejection fraction, by estimation, is 30 to 35%. The left ventricle has moderately decreased function. The left ventricle demonstrates global hypokinesis. Left ventricular diastolic parameters are indeterminate.  2. Right ventricular systolic function is normal. The right ventricular size is normal.  3. Left atrial size was mildly  dilated.  4. A small pericardial effusion is present. The pericardial effusion is circumferential.  5. The mitral valve is normal in structure. No evidence of mitral valve regurgitation. No evidence of mitral stenosis.  6. The aortic valve was not well visualized. Aortic valve regurgitation is mild to moderate. FINDINGS  Left Ventricle: Left ventricular ejection fraction, by estimation, is 30 to 35%. The left ventricle has moderately decreased function. The left ventricle demonstrates global hypokinesis. Definity contrast agent was given IV to delineate the left ventricular endocardial borders. The left ventricular internal cavity size was normal in size. There is no left ventricular hypertrophy. Left ventricular diastolic parameters are indeterminate. Right Ventricle: The right ventricular size is normal. Right vetricular wall thickness was not well visualized. Right ventricular systolic function is normal. Left Atrium: Left atrial size was mildly dilated. Right Atrium: Right atrial size was normal in size. Pericardium: A small pericardial effusion is present. The pericardial effusion is circumferential. Mitral Valve: The mitral valve is normal in structure. No evidence of mitral valve regurgitation. No evidence of mitral valve stenosis. Tricuspid Valve: The tricuspid valve is normal in structure. Tricuspid valve regurgitation is mild . No evidence of tricuspid stenosis. Aortic Valve: The aortic valve was not well visualized. Aortic valve regurgitation is mild to moderate. Aortic regurgitation PHT measures 442 msec. Aortic valve mean gradient measures 8.0 mmHg. Aortic valve peak gradient measures 16.6 mmHg. Aortic valve area, by VTI measures 1.49 cm. Pulmonic Valve: The pulmonic valve was not well visualized. Pulmonic valve regurgitation is not visualized. No evidence of pulmonic stenosis. Aorta: The aortic root is normal in size and structure.  IAS/Shunts: The interatrial septum was not well visualized.  LEFT  VENTRICLE PLAX 2D LVIDd:         4.45 cm   Diastology LVIDs:         4.00 cm   LV e' medial:    4.99 cm/s LV PW:         1.20 cm   LV E/e' medial:  38.5 LV IVS:        0.90 cm   LV e' lateral:   5.43 cm/s LVOT diam:     2.00 cm   LV E/e' lateral: 35.4 LV SV:         53 LV SV Index:   32 LVOT Area:     3.14 cm  RIGHT VENTRICLE RV S prime:     13.10 cm/s TAPSE (M-mode): 1.6 cm LEFT ATRIUM           Index        RIGHT ATRIUM           Index LA diam:      2.70 cm 1.61 cm/m   RA Area:     14.00 cm LA Vol (A2C): 36.5 ml 21.71 ml/m  RA Volume:   34.70 ml  20.64 ml/m LA Vol (A4C): 58.3 ml 34.68 ml/m  AORTIC VALVE                     PULMONIC VALVE AV Area (Vmax):    1.57 cm      PR End Diast Vel: 5.20 msec AV Area (Vmean):   1.47 cm AV Area (VTI):     1.49 cm AV Vmax:           204.00 cm/s AV Vmean:          128.000 cm/s AV VTI:            0.357 m AV Peak Grad:      16.6 mmHg AV Mean Grad:      8.0 mmHg LVOT Vmax:         102.19 cm/s LVOT Vmean:        59.811 cm/s LVOT VTI:          0.170 m LVOT/AV VTI ratio: 0.48 AI PHT:            442 msec  AORTA Ao Root diam: 3.00 cm Ao Asc diam:  3.10 cm MITRAL VALVE                TRICUSPID VALVE MV Area (PHT): 9.25 cm     TR Peak grad:   23.8 mmHg MV Decel Time: 82 msec      TR Vmax:        244.00 cm/s MR Peak grad: 7.7 mmHg MR Vmax:      139.00 cm/s   SHUNTS MV E velocity: 192.00 cm/s  Systemic VTI:  0.17 m MV A velocity: 48.40 cm/s   Systemic Diam: 2.00 cm MV E/A ratio:  3.97 Dina Rich MD Electronically signed by Dina Rich MD Signature Date/Time: 11/16/2022/3:04:20 PM    Final    CT Angio Chest Pulmonary Embolism (PE) W or WO Contrast  Result Date: 11/16/2022 CLINICAL DATA:  Chest pain EXAM: CT ANGIOGRAPHY CHEST WITH CONTRAST TECHNIQUE: Multidetector CT imaging of the chest was performed using the standard protocol during bolus administration of intravenous contrast. Multiplanar CT image reconstructions and MIPs were obtained to evaluate the vascular  anatomy. RADIATION DOSE REDUCTION: This exam was performed according to the departmental  dose-optimization program which includes automated exposure control, adjustment of the mA and/or kV according to patient size and/or use of iterative reconstruction technique. CONTRAST:  75mL OMNIPAQUE IOHEXOL 350 MG/ML SOLN COMPARISON:  X-ray 11/16/2022 FINDINGS: Cardiovascular: Heart is mildly enlarged. Small pericardial effusion. Coronary artery calcifications are seen. The thoracic aorta has a normal course and caliber with scattered atherosclerotic plaque. Multiple areas of pulmonary emboli identified most striking in segmental branches in the right upper lobe such as axial series 4, image 43. Additional subtle area in the anterior left lower lobe on image 54. No large or central embolus. No signs of right heart strain at this time. Mediastinum/Nodes: Patulous esophagus. Small thyroid gland. No specific abnormal lymph node enlargement identified in the axillary regions, hilum or mediastinum. Lungs/Pleura: Centrilobular emphysematous changes are seen greatest in the upper lung zones. There is spiculated left upper lobe mass near the margin of the hilum and mediastinum measuring on series 6, image 49 at 2.5 by 2.1 cm. There is some linear opacity lung bases likely scar or atelectasis. Subtle opacity as well in the left lower lobe. Trace left pleural fluid. No pneumothorax. Breathing motion. Additional nodule in the lingula on series 6 image 86 measuring 7 mm. Upper Abdomen: Right adrenal mass identified measuring 4.5 by 2.9 cm. Nodular thickening of the left adrenal gland as well. Musculoskeletal: Degenerative changes seen along the spine. Critical Value/emergent results were called by telephone at the time of interpretation on 11/16/2022 at 7:48 am to provider Vonita Moss , who verbally acknowledged these results. Review of the MIP images confirms the above findings. IMPRESSION: Segmental pulmonary emboli right upper lobe  greater than left lower lobe. Small amount of clot burden. Mildly enlarged heart with a small pericardial effusion. 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 Separate 7 mm lingular lesion. Right adrenal mass identified at the edge of the imaging field. Malignant or metastatic lesion is possible. Recommend overall further evaluation. PET-CT scan may be useful as the next step in the workup versus sampling. Tiny left pleural effusion Aortic Atherosclerosis (ICD10-I70.0) and Emphysema (ICD10-J43.9). Electronically Signed   By: Karen Kays M.D.   On: 11/16/2022 10:52   DG Chest 2 View  Result Date: 11/16/2022 CLINICAL DATA:  chest pain EXAM: CHEST - 2 VIEW COMPARISON:  None Available. FINDINGS: No pleural effusion. No pneumothorax. No focal airspace opacity. Normal cardiac and mediastinal contours. No radiographically apparent displaced rib fractures. Visualized upper abdomen is unremarkable. Vertebral body heights are maintained. IMPRESSION: No focal airspace opacity. Electronically Signed   By: Lorenza Cambridge M.D.   On: 11/16/2022 09:21    ASSESSMENT & PLAN Erin Pacheco is a 72 y.o. 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 IHC studies suggest upper GI versus pancreaticobiliary primary.  --Scheduled to undergo EGD on 12/09/2022.  --If there is no evidence of upper GI primary, we will proceed with PET imaging to further evaluate for underlying primary.  --Labs today to check CBC, CMP, CEA and CA19-9 levels.  --RTC once workup is complete  #Pulmonary emboli: ---Provoked by underlying malignancy --Currently on Eliquis 5 mg BID, recommend to continue.  --Recommend to bridge with lovenox for upcoming EGD. Instructions provided to patient on when  to hold eliquis and start Lovenox bridge.   #Hemorrhage at  site of right adrenal gland mass: --Suspect secondary to recent biopsy --Hgb levels are stable so monitor for now.   #Spiculated LUL mass: --Seen on CTA chest from 11/16/2022 --Can further evaluate with PET scan and consider biopsy if no other target lesion is identified.    Orders Placed This Encounter  Procedures   NM PET Image Initial (PI) Skull Base To Thigh    Standing Status:   Future    Standing Expiration Date:   12/07/2023    Order Specific Question:   If indicated for the ordered procedure, I authorize the administration of a radiopharmaceutical per Radiology protocol    Answer:   Yes    Order Specific Question:   Preferred imaging location?    Answer:   [facility]    All questions were answered. The patient knows to call the clinic with any problems, questions or concerns.  I have spent a total of 60 minutes minutes of face-to-face and non-face-to-face time, preparing to see the patient, obtaining and/or reviewing separately obtained history, performing a medically appropriate examination, counseling and educating the patient, ordering /tests/procedures,documenting clinical information in the electronic health record,  and care coordination.   Georga Kaufmann, [name] Department of Hematology/Oncology [facility] at [facility] Phone: [phone]  Patient was seen with Dr. Leonides Schanz.   I have read the above note and personally examined the patient. I agree with the assessment and plan as noted above.  Briefly Erin Pacheco is a 72 year old female who presents for evaluation of metastatic adenocarcinoma to the adrenal gland.  Initial pathology shows concern for pancreaticobiliary primary versus upper GI malignancy.  Have requested GI perform an upper GI endoscopy to rule out malignancy.  If no malignancy is seen on EGD would recommend pursuing a PET CT scan in order to find the primary.  The  patient voiced understanding of our findings and the plan moving forward.  Of note she does have a pulmonary embolism and is currently on Eliquis therapy.  She will need to be bridged with Lovenox prior to the procedure.   Ulysees Barns, MD Department of Hematology/Oncology [facility] at [facility] Phone: [phone] Pager: [phone] Email: [email or web address]

## 2022-12-09 ENCOUNTER — Encounter (HOSPITAL_COMMUNITY): Payer: Self-pay | Admitting: Internal Medicine

## 2022-12-09 ENCOUNTER — Ambulatory Visit (HOSPITAL_COMMUNITY): Payer: Medicare HMO | Admitting: Anesthesiology

## 2022-12-09 ENCOUNTER — Other Ambulatory Visit: Payer: Self-pay

## 2022-12-09 ENCOUNTER — Ambulatory Visit (HOSPITAL_COMMUNITY)
Admission: RE | Admit: 2022-12-09 | Discharge: 2022-12-09 | Disposition: A | Discharge status: Home / Self Care | Payer: Medicare HMO | Attending: Internal Medicine | Admitting: Internal Medicine

## 2022-12-09 ENCOUNTER — Encounter (HOSPITAL_COMMUNITY)
Admission: RE | Disposition: A | Discharge status: Home / Self Care | Payer: Self-pay | Source: Home / Self Care | Attending: Internal Medicine

## 2022-12-09 DIAGNOSIS — I110 Hypertensive heart disease with heart failure: Secondary | ICD-10-CM | POA: Insufficient documentation

## 2022-12-09 DIAGNOSIS — K2970 Gastritis, unspecified, without bleeding: Secondary | ICD-10-CM | POA: Diagnosis not present

## 2022-12-09 DIAGNOSIS — C801 Malignant (primary) neoplasm, unspecified: Secondary | ICD-10-CM | POA: Insufficient documentation

## 2022-12-09 DIAGNOSIS — Z7901 Long term (current) use of anticoagulants: Secondary | ICD-10-CM | POA: Diagnosis not present

## 2022-12-09 DIAGNOSIS — K3189 Other diseases of stomach and duodenum: Secondary | ICD-10-CM | POA: Diagnosis not present

## 2022-12-09 DIAGNOSIS — Z86711 Personal history of pulmonary embolism: Secondary | ICD-10-CM | POA: Insufficient documentation

## 2022-12-09 DIAGNOSIS — I5020 Unspecified systolic (congestive) heart failure: Secondary | ICD-10-CM | POA: Insufficient documentation

## 2022-12-09 DIAGNOSIS — K317 Polyp of stomach and duodenum: Secondary | ICD-10-CM | POA: Insufficient documentation

## 2022-12-09 DIAGNOSIS — D175 Benign lipomatous neoplasm of intra-abdominal organs: Secondary | ICD-10-CM | POA: Diagnosis not present

## 2022-12-09 DIAGNOSIS — M1990 Unspecified osteoarthritis, unspecified site: Secondary | ICD-10-CM | POA: Insufficient documentation

## 2022-12-09 DIAGNOSIS — J449 Chronic obstructive pulmonary disease, unspecified: Secondary | ICD-10-CM | POA: Diagnosis not present

## 2022-12-09 DIAGNOSIS — K31819 Angiodysplasia of stomach and duodenum without bleeding: Secondary | ICD-10-CM | POA: Diagnosis not present

## 2022-12-09 DIAGNOSIS — Z79899 Other long term (current) drug therapy: Secondary | ICD-10-CM | POA: Insufficient documentation

## 2022-12-09 DIAGNOSIS — R933 Abnormal findings on diagnostic imaging of other parts of digestive tract: Secondary | ICD-10-CM | POA: Diagnosis not present

## 2022-12-09 DIAGNOSIS — K449 Diaphragmatic hernia without obstruction or gangrene: Secondary | ICD-10-CM | POA: Insufficient documentation

## 2022-12-09 DIAGNOSIS — Z87891 Personal history of nicotine dependence: Secondary | ICD-10-CM | POA: Insufficient documentation

## 2022-12-09 DIAGNOSIS — I5021 Acute systolic (congestive) heart failure: Secondary | ICD-10-CM | POA: Diagnosis not present

## 2022-12-09 DIAGNOSIS — C3412 Malignant neoplasm of upper lobe, left bronchus or lung: Secondary | ICD-10-CM

## 2022-12-09 HISTORY — PX: BIOPSY: SHX5522

## 2022-12-09 HISTORY — PX: ESOPHAGOGASTRODUODENOSCOPY (EGD) WITH PROPOFOL: SHX5813

## 2022-12-09 SURGERY — ESOPHAGOGASTRODUODENOSCOPY (EGD) WITH PROPOFOL
Anesthesia: Monitor Anesthesia Care

## 2022-12-09 MED ORDER — LIDOCAINE 2% (20 MG/ML) 5 ML SYRINGE
INTRAMUSCULAR | Status: DC | PRN
  Administered 2022-12-09: 100 mg via INTRAVENOUS

## 2022-12-09 MED ORDER — LACTATED RINGERS IV SOLN: INTRAVENOUS | Status: DC

## 2022-12-09 MED ORDER — PROPOFOL 10 MG/ML IV BOLUS
INTRAVENOUS | Status: DC | PRN
  Administered 2022-12-09: 20 mg via INTRAVENOUS
  Administered 2022-12-09: 30 mg via INTRAVENOUS

## 2022-12-09 MED ORDER — SODIUM CHLORIDE 0.9 % IV SOLN: INTRAVENOUS | Status: DC

## 2022-12-09 MED ORDER — PROPOFOL 500 MG/50ML IV EMUL
INTRAVENOUS | Status: DC | PRN
  Administered 2022-12-09: 50 ug/kg/min via INTRAVENOUS

## 2022-12-09 MED ORDER — ETOMIDATE 2 MG/ML IV SOLN
INTRAVENOUS | Status: DC | PRN
  Administered 2022-12-09: 4 mg via INTRAVENOUS
  Administered 2022-12-09: 6 mg via INTRAVENOUS

## 2022-12-09 SURGICAL SUPPLY — 15 items

## 2022-12-09 NOTE — Transfer of Care (Signed)
Immediate Anesthesia Transfer of Care Note  Patient: TINIA DIFELICE  Procedure(s) Performed: ESOPHAGOGASTRODUODENOSCOPY (EGD) WITH PROPOFOL BIOPSY  Patient Location: PACU  Anesthesia Type:MAC  Level of Consciousness: drowsy and patient cooperative  Airway & Oxygen Therapy: Patient Spontanous Breathing and Patient connected to face mask oxygen  Post-op Assessment: Report given to RN and Post -op Vital signs reviewed and stable  Post vital signs: Reviewed and stable  Last Vitals:  Vitals Value Taken Time  BP 140/54   Temp    Pulse 67 12/09/22 1011  Resp 18 12/09/22 1011  SpO2 98 % 12/09/22 1011  Vitals shown include unvalidated device data.  Last Pain:  Vitals:   12/09/22 0841  TempSrc: Temporal  PainSc: 0-No pain         Complications: No notable events documented.

## 2022-12-09 NOTE — Anesthesia Postprocedure Evaluation (Signed)
Anesthesia Post Note  Patient: Erin Pacheco  Procedure(s) Performed: ESOPHAGOGASTRODUODENOSCOPY (EGD) WITH PROPOFOL BIOPSY     Patient location during evaluation: PACU Anesthesia Type: MAC Level of consciousness: awake Pain management: pain level controlled Vital Signs Assessment: post-procedure vital signs reviewed and stable Respiratory status: spontaneous breathing, nonlabored ventilation and respiratory function stable Cardiovascular status: stable and blood pressure returned to baseline Postop Assessment: no apparent nausea or vomiting Anesthetic complications: no   No notable events documented.  Last Vitals:  Vitals:   12/09/22 1020 12/09/22 1030  BP: (!) 143/72 127/61  Pulse: 70 70  Resp: (!) 22 (!) 23  Temp:    SpO2: 95% 94%    Last Pain:  Vitals:   12/09/22 1030  TempSrc:   PainSc: 0-No pain                 Linton Rump

## 2022-12-09 NOTE — Discharge Instructions (Signed)
YOU HAD AN ENDOSCOPIC PROCEDURE TODAY: Refer to the procedure report and other information in the discharge instructions given to you for any specific questions about what was found during the examination. If this information does not answer your questions, please call [facility] office at [phone] to clarify.   YOU SHOULD EXPECT: Some feelings of bloating in the abdomen. Passage of more gas than usual. Walking can help get rid of the air that was put into your GI tract during the procedure and reduce the bloating. If you had a lower endoscopy (such as a colonoscopy or flexible sigmoidoscopy) you may notice spotting of blood in your stool or on the toilet paper. Some abdominal soreness may be present for a day or two, also.  DIET: Your first meal following the procedure should be a light meal and then it is ok to progress to your normal diet. A half-sandwich or bowl of soup is an example of a good first meal. Heavy or fried foods are harder to digest and may make you feel nauseous or bloated. Drink plenty of fluids but you should avoid alcoholic beverages for 24 hours. If you had a esophageal dilation, please see attached instructions for diet.    ACTIVITY: Your care partner should take you home directly after the procedure. You should plan to take it easy, moving slowly for the rest of the day. You can resume normal activity the day after the procedure however YOU SHOULD NOT DRIVE, use power tools, machinery or perform tasks that involve climbing or major physical exertion for 24 hours (because of the sedation medicines used during the test).   SYMPTOMS TO REPORT IMMEDIATELY: A gastroenterologist can be reached at any hour. Please call [phone]  for any of the following symptoms:  Following lower endoscopy (colonoscopy, flexible sigmoidoscopy) Excessive amounts of blood in the stool  Significant tenderness, worsening of abdominal pains  Swelling of the abdomen that is new, acute  Fever of 100 or  higher  Following upper endoscopy (EGD, EUS, ERCP, esophageal dilation) Vomiting of blood or coffee ground material  New, significant abdominal pain  New, significant chest pain or pain under the shoulder blades  Painful or persistently difficult swallowing  New shortness of breath  Black, tarry-looking or red, bloody stools  FOLLOW UP:  If any biopsies were taken you will be contacted by phone or by letter within the next 1-3 weeks. Call [phone]  if you have not heard about the biopsies in 3 weeks.  Please also call with any specific questions about appointments or follow up tests.YOU HAD AN ENDOSCOPIC PROCEDURE TODAY: Refer to the procedure report and other information in the discharge instructions given to you for any specific questions about what was found during the examination. If this information does not answer your questions, please call [place] office at [phone] to clarify.   YOU SHOULD EXPECT: Some feelings of bloating in the abdomen. Passage of more gas than usual. Walking can help get rid of the air that was put into your GI tract during the procedure and reduce the bloating. If you had a lower endoscopy (such as a colonoscopy or flexible sigmoidoscopy) you may notice spotting of blood in your stool or on the toilet paper. Some abdominal soreness may be present for a day or two, also.  DIET: Your first meal following the procedure should be a light meal and then it is ok to progress to your normal diet. A half-sandwich or bowl of soup is an example of a   good first meal. Heavy or fried foods are harder to digest and may make you feel nauseous or bloated. Drink plenty of fluids but you should avoid alcoholic beverages for 24 hours. If you had a esophageal dilation, please see attached instructions for diet.    ACTIVITY: Your care partner should take you home directly after the procedure. You should plan to take it easy, moving slowly for the rest of the day. You can resume  normal activity the day after the procedure however YOU SHOULD NOT DRIVE, use power tools, machinery or perform tasks that involve climbing or major physical exertion for 24 hours (because of the sedation medicines used during the test).   SYMPTOMS TO REPORT IMMEDIATELY: A gastroenterologist can be reached at any hour. Please call [phone]  for any of the following symptoms:  Following lower endoscopy (colonoscopy, flexible sigmoidoscopy) Excessive amounts of blood in the stool  Significant tenderness, worsening of abdominal pains  Swelling of the abdomen that is new, acute  Fever of 100 or higher  Following upper endoscopy (EGD, EUS, ERCP, esophageal dilation) Vomiting of blood or coffee ground material  New, significant abdominal pain  New, significant chest pain or pain under the shoulder blades  Painful or persistently difficult swallowing  New shortness of breath  Black, tarry-looking or red, bloody stools  FOLLOW UP:  If any biopsies were taken you will be contacted by phone or by letter within the next 1-3 weeks. Call [phone]  if you have not heard about the biopsies in 3 weeks.  Please also call with any specific questions about appointments or follow up tests. 

## 2022-12-09 NOTE — Op Note (Signed)
[facility] Patient Name: Erin Pacheco Procedure Date : 12/09/2022 MRN: [id number] Attending MD: Particia Lather , , [id number] Date of Birth: 1950/06/25 CSN: [id number] Age: 72 Admit Type: Inpatient Procedure:                Upper GI endoscopy Indications:              Abnormal CT of the GI tract, adenocarcinoma of                            unclear origin Providers:                Madelyn Brunner" Gennaro Africa, RN, Marja Kays, Technician Referring MD:             Briant Cedar, Ulysees Barns Iv Medicines:                Monitored Anesthesia Care Complications:            No immediate complications. Estimated Blood Loss:     Estimated blood loss was minimal. Procedure:                Pre-Anesthesia Assessment:                           - Prior to the procedure, a History and Physical                            was performed, and patient medications and                            allergies were reviewed. The patient's tolerance of                            previous anesthesia was also reviewed. The risks                            and benefits of the procedure and the sedation                            options and risks were discussed with the patient.                            All questions were answered, and informed consent                            was obtained. Prior Anticoagulants: The patient has                            taken Eliquis (apixaban), last dose was 2 days                            prior to procedure. Last dose of Lovenox was 12  hours prior to the procedure. ASA Grade Assessment:                            III - A patient with severe systemic disease. After                            reviewing the risks and benefits, the patient was                            deemed in satisfactory condition to undergo the                            procedure.                            After obtaining informed consent, the endoscope was                            passed under direct vision. Throughout the                            procedure, the patient's blood pressure, pulse, and                            oxygen saturations were monitored continuously. The                            [id number]-H190 ([id number]) Olympus endoscope was introduced                            through the mouth, and advanced to the second part                            of duodenum. The upper GI endoscopy was                            accomplished without difficulty. The patient                            tolerated the procedure well. Scope In: Scope Out: Findings:      The examined esophagus was normal.      A hiatal hernia was present.      Localized nodular mucosa was found in the cardia. Biopsies were taken       with a cold forceps for histology.      Localized inflammation characterized by congestion (edema), erosions and       erythema was found in the gastric antrum. Biopsies were taken with a       cold forceps for histology.      A single angioectasia without bleeding was found in the duodenal bulb.      A single 12 mm sessile polyp with no bleeding was found in the ampulla.       Biopsies were taken with a cold forceps for histology.      Six angioectasias without bleeding were found in the second portion  of       the duodenum and in the third portion of the duodenum.      There was a small suspected lipoma in the third portion of the duodenum.       Biopsies were taken with a cold forceps for histology. Impression:               - Normal esophagus.                           - Hiatal hernia.                           - Nodular mucosa in the cardia. Biopsied.                           - Gastritis. Biopsied.                           - A single non-bleeding angioectasia in the                            duodenum.                           - A single duodenal polyp.  Biopsied.                           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                           - Duodenal lipoma. Biopsied. Recommendation:           - Discharge patient to home (with escort).                           - Await pathology results.                           - Okay to restart Eliquis later today as instructed                            by your hematologist.                           - Will plan to discuss plan with oncology                            colleagues.                           - The findings and recommendations were discussed                            with the patient. Procedure Code(s):        --- Professional ---                           [phone], Esophagogastroduodenoscopy, flexible,  transoral; with biopsy, single or multiple Diagnosis Code(s):        --- Professional ---                           K44.9, Diaphragmatic hernia without obstruction or                            gangrene                           K31.89, Other diseases of stomach and duodenum                           K29.70, Gastritis, unspecified, without bleeding                           K31.819, Angiodysplasia of stomach and duodenum                            without bleeding                           K31.7, Polyp of stomach and duodenum                           D17.5, Benign lipomatous neoplasm of                            intra-abdominal organs                           R93.3, Abnormal findings on diagnostic imaging of                            other parts of digestive tract CPT copyright 2022 [employer]. All rights reserved. The codes documented in this report are preliminary and upon coder review may  be revised to meet current compliance requirements. Dr Particia Lather "Alan Ripper" Leonides Schanz,  12/09/2022 10:22:30 AM Number of Addenda: 0

## 2022-12-09 NOTE — H&P (Signed)
GASTROENTEROLOGY PROCEDURE H&P NOTE   Primary Care Physician: Renaye Rakers, MD    Reason for Procedure:   Adenocarcinoma of unclear origin  Plan:    EGD  Patient is appropriate for endoscopic procedure(s) in the hospital setting.  The nature of the procedure, as well as the risks, benefits, and alternatives were carefully and thoroughly reviewed with the patient. Ample time for discussion and questions allowed. The patient understood, was satisfied, and agreed to proceed.     HPI: Erin Pacheco is a 72 y.o. female who presents for EGD for evaluation of adenocarcinoma.  Patient was most recently seen in the Gastroenterology Clinic on 12/02/22.  No interval change in medical history since that appointment. Please refer to that note for full details regarding GI history and clinical presentation.   Past Medical History:  Diagnosis Date   Arthritis    Cancer (HCC)    Hypertension    Tobacco abuse     Past Surgical History:  Procedure Laterality Date   ABDOMINAL HYSTERECTOMY     BILATERAL OOPHORECTOMY     CATARACT EXTRACTION W/PHACO  12/27/2010   Procedure: CATARACT EXTRACTION PHACO AND INTRAOCULAR LENS PLACEMENT (IOC);  Surgeon: Gemma Payor;  Location: [facility];  Service: Ophthalmology;  Laterality: Right;   CATARACT EXTRACTION W/PHACO  03/28/2011   Procedure: CATARACT EXTRACTION PHACO AND INTRAOCULAR LENS PLACEMENT (IOC);  Surgeon: Gemma Payor;  Location: [facility];  Service: Ophthalmology;  Laterality: Left;  CDE 7.27    Prior to Admission medications   Medication Sig Start Date End Date Taking? Authorizing Provider  acetaminophen (TYLENOL) 325 MG tablet Take 2 tablets (650 mg total) by mouth every 6 (six) hours as needed for mild pain (or Fever >/= 101). 11/26/22  Yes Sharl Ma, Sarina Ill, MD  apixaban (ELIQUIS) 5 MG TABS tablet Take 1 tablet (5 mg total) by mouth 2 (two) times daily. 11/26/22  Yes Lama, Sarina Ill, MD  Camphor-Menthol-Methyl Sal (SALONPAS) 3.06-11-08 % PTCH Place 1 patch  onto the skin daily as needed (pain).   Yes [provider]  dexamethasone (DECADRON) 4 MG tablet Take 4 mg by mouth daily as needed (For arthritis pain).   Yes [provider]  enoxaparin (LOVENOX) 60 MG/0.6ML injection Inject 0.6 mLs (60 mg total) into the skin every 12 (twelve) hours for 2 days. Hold Lovenox 12 hours before EGD. 12/07/22 12/09/22 Yes Thayil, Karena Addison T, [name]  fluticasone furoate-vilanterol (BREO ELLIPTA) 100-25 MCG/ACT AEPB Inhale 1 puff into the lungs daily. 11/27/22  Yes [name], MD  losartan (COZAAR) 25 MG tablet Take 1 tablet (25 mg total) by mouth daily. 11/27/22  Yes [name], MD  metoprolol succinate (TOPROL-XL) 50 MG 24 hr tablet Take 1 tablet (50 mg total) by mouth daily. Take with or immediately following a meal. 11/27/22  Yes Sharl Ma, Sarina Ill, MD  Multiple Vitamin (MULTIVITAMIN) tablet Take 1 tablet by mouth daily.     Yes [provider]  oxyCODONE (OXY IR/ROXICODONE) 5 MG immediate release tablet Take 1 tablet (5 mg total) by mouth every 6 (six) hours as needed for moderate pain. 11/26/22  Yes [name], MD  Polyethyl Glycol-Propyl Glycol (SYSTANE OP) Place 1 drop into both eyes daily as needed (dry eye).   Yes [provider]  spironolactone (ALDACTONE) 25 MG tablet Take 0.5 tablets (12.5 mg total) by mouth daily. 11/27/22  Yes [name], MD    Current Facility-Administered Medications  Medication Dose Route Frequency Provider Last Rate Last  Admin   0.9 %  sodium chloride infusion   Intravenous Continuous Imogene Burn, MD       0.9 %  sodium chloride infusion   Intravenous Continuous Imogene Burn, MD       lactated ringers infusion   Intravenous Continuous Imogene Burn, MD 10 mL/hr at 12/09/22 0851 New Bag at 12/09/22 0851    Allergies as of 12/02/2022   (No Known Allergies)    Family History  Problem Relation Age of Onset   Ovarian cancer Mother 34   High blood pressure Mother    Throat cancer Brother         smoker   Liver disease Neg Hx    Esophageal cancer Neg Hx    Colon cancer Neg Hx     Social History   Socioeconomic History   Marital status: Single    Spouse name: Not on file   Number of children: Not on file   Years of education: Not on file   Highest education level: Not on file  Occupational History   Not on file  Tobacco Use   Smoking status: Former    Packs/day: 0.50    Years: 30.00    Additional pack years: 0.00    Total pack years: 15.00    Types: Cigarettes    Quit date: 11/2022    Years since quitting: 0.0   Smokeless tobacco: Never  Vaping Use   Vaping Use: Never used  Substance and Sexual Activity   Alcohol use: No   Drug use: No   Sexual activity: Yes    Birth control/protection: Surgical  Other Topics Concern   Not on file  Social History Narrative   Not on file   Social Determinants of Health   Financial Resource Strain: Not on file  Food Insecurity: No Food Insecurity (11/17/2022)   Hunger Vital Sign    Worried About Running Out of Food in the Last Year: Never true    Ran Out of Food in the Last Year: Never true  Transportation Needs: No Transportation Needs (11/17/2022)   PRAPARE - Administrator, Civil Service (Medical): No    Lack of Transportation (Non-Medical): No  Physical Activity: Not on file  Stress: Not on file  Social Connections: Not on file  Intimate Partner Violence: Not At Risk (11/17/2022)   Humiliation, Afraid, Rape, and Kick questionnaire    Fear of Current or Ex-Partner: No    Emotionally Abused: No    Physically Abused: No    Sexually Abused: No    Physical Exam: Vital signs in last 24 hours: BP 133/60   Pulse 71   Temp 98.4 F (36.9 C) (Temporal)   Resp 15   Ht 5\' 3"  (1.6 m)   Wt 61.5 kg   SpO2 94%   BMI 24.02 kg/m  GEN: NAD EYE: Sclerae anicteric ENT: MMM CV: Non-tachycardic Pulm: No increased WOB GI: Soft NEURO:  Alert & Oriented   Eulah Pont, MD [facility]   12/09/2022  9:08 AM

## 2022-12-13 ENCOUNTER — Encounter (HOSPITAL_COMMUNITY): Payer: Self-pay | Admitting: Internal Medicine

## 2022-12-13 LAB — SURGICAL PATHOLOGY

## 2022-12-15 ENCOUNTER — Ambulatory Visit
Admission: RE | Admit: 2022-12-15 | Discharge: 2022-12-15 | Disposition: A | Discharge status: Home / Self Care | Payer: Medicare HMO | Source: Ambulatory Visit | Attending: Physician Assistant | Admitting: Physician Assistant

## 2022-12-15 DIAGNOSIS — I700 Atherosclerosis of aorta: Secondary | ICD-10-CM | POA: Insufficient documentation

## 2022-12-15 DIAGNOSIS — C801 Malignant (primary) neoplasm, unspecified: Secondary | ICD-10-CM | POA: Insufficient documentation

## 2022-12-15 DIAGNOSIS — E279 Disorder of adrenal gland, unspecified: Secondary | ICD-10-CM | POA: Diagnosis not present

## 2022-12-15 DIAGNOSIS — R918 Other nonspecific abnormal finding of lung field: Secondary | ICD-10-CM | POA: Insufficient documentation

## 2022-12-15 DIAGNOSIS — J439 Emphysema, unspecified: Secondary | ICD-10-CM | POA: Insufficient documentation

## 2022-12-15 DIAGNOSIS — H5203 Hypermetropia, bilateral: Secondary | ICD-10-CM | POA: Diagnosis not present

## 2022-12-15 LAB — GLUCOSE, CAPILLARY: Glucose-Capillary: 90 mg/dL (ref 70–99)

## 2022-12-15 MED ORDER — FLUDEOXYGLUCOSE F - 18 (FDG) INJECTION
7.0000 | Freq: Once | INTRAVENOUS | Status: AC
  Administered 2022-12-15: 7.6 via INTRAVENOUS

## 2022-12-16 ENCOUNTER — Telehealth: Payer: Self-pay | Admitting: Physician Assistant

## 2022-12-16 NOTE — Telephone Encounter (Signed)
I spoke to [name] and her daughter to review the EGD biopsy results and PET scan results. EGD and biopsies were negative for malignancy. PET scan from yesterday shows hypermetabolic spiculated left upper lobe lung nodule, concerning for primary lung malignancy. We have requested for Dr. Tonia Brooms to biopsy the LUL lung nodule which he will discuss at the upcoming visit on 7/18. We will follow up with the patient once the lung biopsy results are finalized.   Patient and her daughter expressed understanding of the plan provided.

## 2022-12-20 ENCOUNTER — Other Ambulatory Visit (HOSPITAL_COMMUNITY): Payer: Self-pay | Admitting: Family Medicine

## 2022-12-20 DIAGNOSIS — Z1231 Encounter for screening mammogram for malignant neoplasm of breast: Secondary | ICD-10-CM

## 2022-12-21 ENCOUNTER — Encounter: Payer: Self-pay | Admitting: Physician Assistant

## 2022-12-21 ENCOUNTER — Ambulatory Visit: Payer: Medicare HMO | Attending: Physician Assistant | Admitting: Physician Assistant

## 2022-12-21 VITALS — BP 132/70 | HR 73 | Ht 63.0 in | Wt 130.0 lb

## 2022-12-21 DIAGNOSIS — I5020 Unspecified systolic (congestive) heart failure: Secondary | ICD-10-CM | POA: Diagnosis not present

## 2022-12-21 DIAGNOSIS — I2694 Multiple subsegmental pulmonary emboli without acute cor pulmonale: Secondary | ICD-10-CM | POA: Diagnosis not present

## 2022-12-21 DIAGNOSIS — R918 Other nonspecific abnormal finding of lung field: Secondary | ICD-10-CM | POA: Diagnosis not present

## 2022-12-21 DIAGNOSIS — E278 Other specified disorders of adrenal gland: Secondary | ICD-10-CM

## 2022-12-21 LAB — CBC
Hematocrit: 26.3 % — ABNORMAL LOW (ref 34.0–46.6)
Hemoglobin: 9 g/dL — ABNORMAL LOW (ref 11.1–15.9)
MCH: 28.3 pg (ref 26.6–33.0)
MCHC: 34.2 g/dL (ref 31.5–35.7)
MCV: 83 fL (ref 79–97)
Platelets: 512 10*3/uL — ABNORMAL HIGH (ref 150–450)
RBC: 3.18 x10E6/uL — ABNORMAL LOW (ref 3.77–5.28)
RDW: 14.6 % (ref 11.7–15.4)
WBC: 9.2 10*3/uL (ref 3.4–10.8)

## 2022-12-21 MED ORDER — LOSARTAN POTASSIUM 50 MG PO TABS: 50.0000 mg | ORAL_TABLET | Freq: Every day | ORAL | 3 refills | Status: DC

## 2022-12-21 MED ORDER — SPIRONOLACTONE 25 MG PO TABS: 12.5000 mg | ORAL_TABLET | Freq: Every day | ORAL | 1 refills | Status: DC

## 2022-12-21 MED ORDER — METOPROLOL SUCCINATE ER 50 MG PO TB24: 50.0000 mg | ORAL_TABLET | Freq: Every day | ORAL | 1 refills | Status: DC

## 2022-12-21 NOTE — Patient Instructions (Signed)
Medication Instructions:  Increase Losartan to 50 mg ( Take 1 Tablet Daily). *If you need a refill on your cardiac medications before your next appointment, please call your pharmacy*   Lab Work: CBC today If you have labs (blood work) drawn today and your tests are completely normal, you will receive your results only by: MyChart Message (if you have MyChart) OR A paper copy in the mail If you have any lab test that is abnormal or we need to change your treatment, we will call you to review the results.   Testing/Procedures: [address], [address]. Your physician has requested that you have an echocardiogram. Echocardiography is a painless test that uses sound waves to create images of your heart. It provides your doctor with information about the size and shape of your heart and how well your heart's chambers and valves are working. This procedure takes approximately one hour. There are no restrictions for this procedure. Please do NOT wear cologne, perfume, aftershave, or lotions (deodorant is allowed). Please arrive 15 minutes prior to your appointment time.    Follow-Up: At [facility], you and your health needs are our priority.  As part of our continuing mission to provide you with exceptional heart care, we have created designated Provider Care Teams.  These Care Teams include your primary Cardiologist (physician) and Advanced Practice Providers (APPs -  Physician Assistants and Nurse Practitioners) who all work together to provide you with the care you need, when you need it.  We recommend signing up for the patient portal called "MyChart".  Sign up information is provided on this After Visit Summary.  MyChart is used to connect with patients for Virtual Visits (Telemedicine).  Patients are able to view lab/test results, encounter notes, upcoming appointments, etc.  Non-urgent messages can be sent to your provider as well.   To learn more about what you can do  with MyChart, go to [email or web address].    Your next appointment:   4 month(s)  Provider:   Rollene Rotunda, MD

## 2022-12-21 NOTE — Progress Notes (Signed)
Cardiology Office Note:  .   Date:  12/21/2022  ID:  Erin Pacheco, DOB 07-10-1950, MRN [id number] PCP: Renaye Rakers, MD  [facility] Providers Cardiologist:  Rollene Rotunda, MD     History of Present Illness: .   Erin Pacheco is a 72 y.o. female with past medical history of hypertension, tobacco abuse, LBBB, PE and recently diagnosed LV dysfunction.  Patient was first seen by cardiology service on 11/17/2022 during hospital admission for persistent dry cough.  Serial troponin was negative x 2.  D-dimer was elevated.  A CTA showed segmental PE in the right upper lobe greater than left lower lobe, small amount of clot burden, emphysematous lung changes with spiculated left upper lobe lung mass measuring 2.5 cm, there was also right adrenal mass measuring 4.5 x 2.9 cm.  Image was concerning for malignancy.  Patient was subsequently transferred to [facility] for further workup.  Echocardiogram obtained on 11/16/2022 showed EF 30 to 35%, global hypokinesis, normal RV, small circumferential pericardial effusion, mild LAE, mild to moderate AI.  Patient was seen by Dr. Antoine Poche, she was placed on Toprol-XL, ARB and spironolactone.  After adrenal biopsy, she was started on Eliquis.  Lower extremity venous Doppler was negative for DVT.  MRI of the brain was negative for metastasis or acute intracranial abnormality.  Bone scan was negative for metastasis.  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  MRCP revealed large right adrenal mass with evidence of hemorrhage.  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  Most recent hemoglobin was 9.7 on 12/06/2022.  Patient presents today for follow-up along  with daughter.  She denies significant chest pain or shortness of breath.  She has been having right flank pain.  I will obtain a CBC to make sure she is not bleeding from her adrenal mass.  She appears to be euvolemic on physical exam.  Blood pressure is borderline high, I will increase her losartan to 50 mg daily.  She will need a limited echocardiogram in 3 months prior to follow-up with Dr. Antoine Poche.  ROS:   She denies chest pain, palpitations, dyspnea, pnd, orthopnea, n, v, dizziness, syncope, edema, weight gain, or early satiety. All other systems reviewed and are otherwise negative except as noted above.    Studies Reviewed: .        Cardiac Studies & Procedures       ECHOCARDIOGRAM  ECHOCARDIOGRAM COMPLETE 11/23/2022  Narrative ECHOCARDIOGRAM REPORT    Patient Name:   Erin Pacheco Date of Exam: 11/16/2022 Medical Rec #:  [id number]        Height:       64.0 in Accession #:    [id number]       Weight:       140.0 lb Date of Birth:  March 10, 1951        BSA:          1.681 m Patient Age:    71 years         BP:           158/115 mmHg Patient Gender: F                HR:           78 bpm.  [facility]  Procedure: 2D Echo, Cardiac Doppler and Color Doppler  Indications:    Pulmonary Embolus I26.09  History:        Patient has prior history of Echocardiogram examinations, most recent 11/16/2001. Signs/Symptoms:Murmur; Risk Factors:Hypertension.  Sonographer:    Aron Baba Referring Phys: [id number] COURAGE EMOKPAE  IMPRESSIONS   1. Left ventricular ejection fraction, by estimation, is 30 to 35%. The left ventricle has moderately decreased function. The left ventricle demonstrates global hypokinesis. Left ventricular diastolic parameters are indeterminate. 2. Right ventricular systolic function is normal. The right ventricular size is normal. 3. Left atrial size was mildly dilated. 4. A small pericardial effusion is present. The pericardial effusion is  circumferential. 5. The mitral valve is normal in structure. No evidence of mitral valve regurgitation. No evidence of mitral stenosis. 6. The aortic valve was not well visualized. Aortic valve regurgitation is mild to moderate.  FINDINGS Left Ventricle: Left ventricular ejection fraction, by estimation, is 30 to 35%. The left ventricle has moderately decreased function. The left ventricle demonstrates global hypokinesis. Definity contrast agent was given IV to delineate the left ventricular endocardial borders. The left ventricular internal cavity size was normal in size. There is no left ventricular hypertrophy. Left ventricular diastolic parameters are indeterminate.  Right Ventricle: The right ventricular size is normal. Right vetricular wall thickness was not well visualized. Right ventricular systolic function is normal.  Left Atrium: Left atrial size was mildly dilated.  Right Atrium: Right atrial size was normal in size.  Pericardium: A small pericardial effusion is present. The pericardial effusion is circumferential.  Mitral Valve: The mitral valve is normal in structure. No evidence of mitral valve regurgitation. No evidence of mitral valve stenosis.  Tricuspid Valve: The tricuspid valve is normal in structure. Tricuspid valve regurgitation is mild . No evidence of tricuspid stenosis.  Aortic Valve: The aortic valve was not well visualized. Aortic valve regurgitation is mild to moderate. Aortic regurgitation PHT measures 442 msec. Aortic valve mean gradient measures 8.0 mmHg. Aortic valve peak gradient measures 16.6 mmHg. Aortic valve area, by VTI measures 1.49 cm.  Pulmonic Valve: The pulmonic valve was not well visualized. Pulmonic valve regurgitation is not visualized. No evidence of pulmonic stenosis.  Aorta: The aortic root is normal in size and structure.  IAS/Shunts: The interatrial septum was not well visualized.   LEFT VENTRICLE PLAX 2D LVIDd:         4.45 cm    Diastology LVIDs:         4.00 cm   LV e' medial:    4.99 cm/s LV PW:         1.20 cm   LV E/e' medial:  38.5 LV IVS:        0.90 cm   LV e' lateral:   5.43 cm/s LVOT diam:     2.00 cm   LV E/e' lateral: 35.4 LV SV:         53 LV SV Index:   32 LVOT Area:     3.14 cm   RIGHT VENTRICLE RV S prime:     13.10 cm/s TAPSE (M-mode): 1.6 cm  LEFT ATRIUM           Index        RIGHT ATRIUM           Index LA diam:      2.70 cm 1.61 cm/m   RA Area:     14.00 cm LA Vol (A2C): 36.5 ml  21.71 ml/m  RA Volume:   34.70 ml  20.64 ml/m LA Vol (A4C): 58.3 ml 34.68 ml/m AORTIC VALVE                     PULMONIC VALVE AV Area (Vmax):    1.57 cm      PR End Diast Vel: 5.20 msec AV Area (Vmean):   1.47 cm AV Area (VTI):     1.49 cm AV Vmax:           204.00 cm/s AV Vmean:          128.000 cm/s AV VTI:            0.357 m AV Peak Grad:      16.6 mmHg AV Mean Grad:      8.0 mmHg LVOT Vmax:         102.19 cm/s LVOT Vmean:        59.811 cm/s LVOT VTI:          0.170 m LVOT/AV VTI ratio: 0.48 AI PHT:            442 msec  AORTA Ao Root diam: 3.00 cm Ao Asc diam:  3.10 cm  MITRAL VALVE                TRICUSPID VALVE MV Area (PHT): 9.25 cm     TR Peak grad:   23.8 mmHg MV Decel Time: 82 msec      TR Vmax:        244.00 cm/s MR Peak grad: 7.7 mmHg MR Vmax:      139.00 cm/s   SHUNTS MV E velocity: 192.00 cm/s  Systemic VTI:  0.17 m MV A velocity: 48.40 cm/s   Systemic Diam: 2.00 cm MV E/A ratio:  3.97  Dina Rich MD Electronically signed by Dina Rich MD Signature Date/Time: 11/16/2022/3:04:20 PM    Final             Risk Assessment/Calculations:             Physical Exam:   VS:  BP 132/70 (BP Location: Left Arm, Patient Position: Sitting, Cuff Size: Normal)   Pulse 73   Ht 5\' 3"  (1.6 m)   Wt 130 lb (59 kg)   SpO2 93%   BMI 23.03 kg/m    Wt Readings from Last 3 Encounters:  12/21/22 130 lb (59 kg)  12/09/22 135 lb 9.3 oz (61.5 kg)  12/06/22 135 lb 8 oz (61.5  kg)    GEN: Well nourished, well developed in no acute distress NECK: No JVD; No carotid bruits CARDIAC: RRR, no murmurs, rubs, gallops RESPIRATORY:  Clear to auscultation without rales, wheezing or rhonchi  ABDOMEN: Soft, non-tender, non-distended EXTREMITIES:  No edema; No deformity   ASSESSMENT AND PLAN: .    HFrEF: Newly diagnosed during the recent hospitalization.  She was also diagnosed with lung mass and a right adrenal mass.  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  She denies any chest pain or shortness of breath.  She was placed on metoprolol succinate, losartan and spironolactone.  Plan for repeat echocardiogram in September  Right adrenal mass: Biopsy confirmed metastatic cancer of unknown origin.  Lung mass: Followed by oncology service  PE: Diagnosed during recent hospitalization.  On Eliquis.       Dispo: Follow-up with Dr. Antoine Poche in 3 months after repeat echocardiogram  Signed, Azalee Course, PA

## 2022-12-22 ENCOUNTER — Ambulatory Visit: Payer: Medicare HMO | Admitting: Pulmonary Disease

## 2022-12-22 ENCOUNTER — Encounter: Payer: Self-pay | Admitting: Pulmonary Disease

## 2022-12-22 ENCOUNTER — Telehealth: Payer: Self-pay | Admitting: *Deleted

## 2022-12-22 ENCOUNTER — Other Ambulatory Visit: Payer: Self-pay | Admitting: Hematology and Oncology

## 2022-12-22 VITALS — BP 120/70 | HR 74 | Ht 63.0 in | Wt 130.8 lb

## 2022-12-22 DIAGNOSIS — R911 Solitary pulmonary nodule: Secondary | ICD-10-CM | POA: Diagnosis not present

## 2022-12-22 DIAGNOSIS — E278 Other specified disorders of adrenal gland: Secondary | ICD-10-CM | POA: Diagnosis not present

## 2022-12-22 DIAGNOSIS — I2694 Multiple subsegmental pulmonary emboli without acute cor pulmonale: Secondary | ICD-10-CM

## 2022-12-22 MED ORDER — OXYCODONE HCL 5 MG PO TABS: 5.0000 mg | ORAL_TABLET | Freq: Four times a day (QID) | ORAL | 0 refills | Status: AC | PRN

## 2022-12-22 NOTE — Progress Notes (Signed)
Synopsis: Referred in July 2024 for lung nodule by Renaye Rakers, MD  Subjective:   PATIENT ID: Erin Pacheco GENDER: female DOB: 02/22/1951, MRN: [id number]  Chief Complaint  Patient presents with   Hospitalization Follow-up    Hosp. F/up    This is a 72 year old female that was referred for evaluation of a lung nodule past medical history of hypertension and tobacco use.  Mother with ovarian cancer Brother with throat cancer.  Patient has had a longstanding history of tobacco use.  Patient went to the hospital in June 2024 was found to have 2 bilateral subsegmental PE.  On the CT evaluation found to have a lung mass and what appeared to be a right-sided adrenal mass.  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  Now with a lung nodule in the chest after being seen by oncology is recommending repeat biopsy for consideration of access to the chest lesion to see if were dealing with 2 separate primary locations.    Past Medical History:  Diagnosis Date   Arthritis    Cancer (HCC)    Hypertension    Tobacco abuse      Family History  Problem Relation Age of Onset   Ovarian cancer Mother 12   High blood pressure Mother    Throat cancer Brother        smoker   Liver disease Neg Hx    Esophageal cancer Neg Hx    Colon cancer Neg Hx      Past Surgical History:  Procedure Laterality Date   ABDOMINAL HYSTERECTOMY     BILATERAL OOPHORECTOMY     BIOPSY  12/09/2022   Procedure: BIOPSY;  Surgeon: Imogene Burn, MD;  Location: [facility] ENDOSCOPY;  Service: Gastroenterology;;   CATARACT EXTRACTION W/PHACO  12/27/2010   Procedure: CATARACT EXTRACTION PHACO AND INTRAOCULAR LENS PLACEMENT (IOC);  Surgeon: Gemma Payor;  Location: [facility] ORS;  Service: Ophthalmology;  Laterality: Right;   CATARACT EXTRACTION W/PHACO  03/28/2011   Procedure: CATARACT EXTRACTION PHACO AND INTRAOCULAR LENS PLACEMENT (IOC);  Surgeon: Gemma Payor;  Location:  [facility] ORS;  Service: Ophthalmology;  Laterality: Left;  CDE 7.27   ESOPHAGOGASTRODUODENOSCOPY (EGD) WITH PROPOFOL N/A 12/09/2022   Procedure: ESOPHAGOGASTRODUODENOSCOPY (EGD) WITH PROPOFOL;  Surgeon: Imogene Burn, MD;  Location: [facility] ENDOSCOPY;  Service: Gastroenterology;  Laterality: N/A;    Social History   Socioeconomic History   Marital status: Single    Spouse name: Not on file   Number of children: Not on file   Years of education: Not on file   Highest education level: Not on file  Occupational History   Not on file  Tobacco Use   Smoking status: Former    Current packs/day: 0.00    Average packs/day: 0.5 packs/day for 30.0 years (15.0 ttl pk-yrs)    Types: Cigarettes    Start date: 11/1992    Quit date: 11/2022    Years since quitting: 0.1   Smokeless tobacco: Never  Vaping Use   Vaping status: Never Used  Substance and Sexual Activity   Alcohol use: No   Drug use: No   Sexual activity: Yes    Birth control/protection: Surgical  Other Topics Concern   Not on file  Social History Narrative   Not on file   Social Determinants of Health   Financial Resource Strain: Not on file  Food Insecurity: No Food Insecurity (11/17/2022)   Hunger Vital Sign    Worried  About Running Out of Food in the Last Year: Never true    Ran Out of Food in the Last Year: Never true  Transportation Needs: No Transportation Needs (11/17/2022)   PRAPARE - Administrator, Civil Service (Medical): No    Lack of Transportation (Non-Medical): No  Physical Activity: Not on file  Stress: Not on file  Social Connections: Not on file  Intimate Partner Violence: Not At Risk (11/17/2022)   Humiliation, Afraid, Rape, and Kick questionnaire    Fear of Current or Ex-Partner: No    Emotionally Abused: No    Physically Abused: No    Sexually Abused: No     No Known Allergies   Outpatient Medications Prior to Visit  Medication Sig Dispense Refill   acetaminophen (TYLENOL) 325 MG tablet Take  2 tablets (650 mg total) by mouth every 6 (six) hours as needed for mild pain (or Fever >/= 101).     apixaban (ELIQUIS) 5 MG TABS tablet Take 1 tablet (5 mg total) by mouth 2 (two) times daily. 60 tablet 2   dexamethasone (DECADRON) 4 MG tablet Take 4 mg by mouth daily as needed (For arthritis pain).     fluticasone furoate-vilanterol (BREO ELLIPTA) 100-25 MCG/ACT AEPB Inhale 1 puff into the lungs daily. 28 each 0   losartan (COZAAR) 50 MG tablet Take 1 tablet (50 mg total) by mouth daily. 90 tablet 3   metoprolol succinate (TOPROL-XL) 50 MG 24 hr tablet Take 1 tablet (50 mg total) by mouth daily. Take with or immediately following a meal. 30 tablet 1   Multiple Vitamin (MULTIVITAMIN) tablet Take 1 tablet by mouth daily.       oxyCODONE (OXY IR/ROXICODONE) 5 MG immediate release tablet Take 1 tablet (5 mg total) by mouth every 6 (six) hours as needed for moderate pain. 20 tablet 0   Polyethyl Glycol-Propyl Glycol (SYSTANE OP) Place 1 drop into both eyes daily as needed (dry eye).     spironolactone (ALDACTONE) 25 MG tablet Take 0.5 tablets (12.5 mg total) by mouth daily. 30 tablet 1   Camphor-Menthol-Methyl Sal (SALONPAS) 3.06-11-08 % PTCH Place 1 patch onto the skin daily as needed (pain). (Patient not taking: Reported on 12/21/2022)     enoxaparin (LOVENOX) 60 MG/0.6ML injection Inject 0.6 mLs (60 mg total) into the skin every 12 (twelve) hours for 2 days. Hold Lovenox 12 hours before EGD. 2.4 mL 0   No facility-administered medications prior to visit.    Review of Systems  Constitutional:  Negative for chills, fever, malaise/fatigue and weight loss.  HENT:  Negative for hearing loss, sore throat and tinnitus.   Eyes:  Negative for blurred vision and double vision.  Respiratory:  Positive for shortness of breath. Negative for cough, hemoptysis, sputum production, wheezing and stridor.   Cardiovascular:  Negative for chest pain, palpitations, orthopnea, leg swelling and PND.  Gastrointestinal:   Negative for abdominal pain, constipation, diarrhea, heartburn, nausea and vomiting.  Genitourinary:  Negative for dysuria, hematuria and urgency.  Musculoskeletal:  Negative for joint pain and myalgias.  Skin:  Negative for itching and rash.  Neurological:  Negative for dizziness, tingling, weakness and headaches.  Endo/Heme/Allergies:  Negative for environmental allergies. Does not bruise/bleed easily.  Psychiatric/Behavioral:  Negative for depression. The patient is not nervous/anxious and does not have insomnia.   All other systems reviewed and are negative.    Objective:  Physical Exam Vitals reviewed.  Constitutional:      General: She is not in  acute distress.    Appearance: She is well-developed.  HENT:     Head: Normocephalic and atraumatic.  Eyes:     General: No scleral icterus.    Conjunctiva/sclera: Conjunctivae normal.     Pupils: Pupils are equal, round, and reactive to light.  Neck:     Vascular: No JVD.     Trachea: No tracheal deviation.  Cardiovascular:     Rate and Rhythm: Normal rate and regular rhythm.     Heart sounds: Normal heart sounds. No murmur heard. Pulmonary:     Effort: Pulmonary effort is normal. No tachypnea, accessory muscle usage or respiratory distress.     Breath sounds: No stridor. No wheezing, rhonchi or rales.  Abdominal:     General: Bowel sounds are normal. There is no distension.     Palpations: Abdomen is soft.     Tenderness: There is no abdominal tenderness.  Musculoskeletal:        General: No tenderness.     Cervical back: Neck supple.  Lymphadenopathy:     Cervical: No cervical adenopathy.  Skin:    General: Skin is warm and dry.     Capillary Refill: Capillary refill takes less than 2 seconds.     Findings: No rash.  Neurological:     Mental Status: She is alert and oriented to person, place, and time.  Psychiatric:        Behavior: Behavior normal.      Vitals:   12/22/22 1309  BP: 120/70  Pulse: 74  SpO2:  95%  Weight: 130 lb 12.8 oz (59.3 kg)  Height: 5\' 3"  (1.6 m)   95% on RA BMI Readings from Last 3 Encounters:  12/22/22 23.17 kg/m  12/21/22 23.03 kg/m  12/09/22 24.02 kg/m   Wt Readings from Last 3 Encounters:  12/22/22 130 lb 12.8 oz (59.3 kg)  12/21/22 130 lb (59 kg)  12/09/22 135 lb 9.3 oz (61.5 kg)     CBC    Component Value Date/Time   WBC 9.2 12/21/2022 1655   WBC 10.2 12/06/2022 1454   WBC 13.0 (H) 11/26/2022 0435   RBC 3.18 (L) 12/21/2022 1655   RBC 3.31 (L) 12/06/2022 1454   HGB 9.0 (L) 12/21/2022 1655   HCT 26.3 (L) 12/21/2022 1655   PLT 512 (H) 12/21/2022 1655   MCV 83 12/21/2022 1655   MCH 28.3 12/21/2022 1655   MCH 29.3 12/06/2022 1454   MCHC 34.2 12/21/2022 1655   MCHC 32.9 12/06/2022 1454   RDW 14.6 12/21/2022 1655   LYMPHSABS 2.9 12/06/2022 1454   MONOABS 0.8 12/06/2022 1454   EOSABS 0.2 12/06/2022 1454   BASOSABS 0.1 12/06/2022 1454     Chest Imaging:  July 2024 nuclear medicine pet imaging: Patient with a hypermetabolic lesion in the right adrenal gland as well as 1 in the left upper lobe. The patient's images have been independently reviewed by me.    Pulmonary Functions Testing Results:     No data to display          FeNO:   Pathology:   Echocardiogram:   Heart Catheterization:     Assessment & Plan:     ICD-10-CM   1. Lung nodule  R91.1 Ambulatory referral to Pulmonology    Procedural/ Surgical Case Request: ROBOTIC ASSISTED NAVIGATIONAL BRONCHOSCOPY    2. Multiple subsegmental pulmonary emboli without acute cor pulmonale (HCC)  I26.94     3. Adrenal mass, right (HCC)  E27.8  Discussion:  This is a 72 year old female longstanding history of tobacco use found to have a pulmonary nodule, right adrenal mass and subsegmental PE.  Plan: She has had several weeks of anticoagulation and do think it is okay to hold her anticoagulation for procedure. Especially since she had lower extremity Dopplers that were  negative for DVT. We will hold her [facility] for 2 days prior to the procedure and then restart. She is not on any diabetic medications. Will recommend robotic assisted navigational bronchoscopy tissue sampling of the left upper lobe pulmonary nodule. Tentative bronchoscopy date will be on 01/09/2023.  Bronchoscopy 1 week follow-up with SG, NP after procedure complete.  We appreciate PCC's help with scheduling.   Current Outpatient Medications:    acetaminophen (TYLENOL) 325 MG tablet, Take 2 tablets (650 mg total) by mouth every 6 (six) hours as needed for mild pain (or Fever >/= 101)., Disp: , Rfl:    apixaban (ELIQUIS) 5 MG TABS tablet, Take 1 tablet (5 mg total) by mouth 2 (two) times daily., Disp: 60 tablet, Rfl: 2   dexamethasone (DECADRON) 4 MG tablet, Take 4 mg by mouth daily as needed (For arthritis pain)., Disp: , Rfl:    fluticasone furoate-vilanterol (BREO ELLIPTA) 100-25 MCG/ACT AEPB, Inhale 1 puff into the lungs daily., Disp: 28 each, Rfl: 0   losartan (COZAAR) 50 MG tablet, Take 1 tablet (50 mg total) by mouth daily., Disp: 90 tablet, Rfl: 3   metoprolol succinate (TOPROL-XL) 50 MG 24 hr tablet, Take 1 tablet (50 mg total) by mouth daily. Take with or immediately following a meal., Disp: 30 tablet, Rfl: 1   Multiple Vitamin (MULTIVITAMIN) tablet, Take 1 tablet by mouth daily.  , Disp: , Rfl:    oxyCODONE (OXY IR/ROXICODONE) 5 MG immediate release tablet, Take 1 tablet (5 mg total) by mouth every 6 (six) hours as needed for moderate pain., Disp: 20 tablet, Rfl: 0   Polyethyl Glycol-Propyl Glycol (SYSTANE OP), Place 1 drop into both eyes daily as needed (dry eye)., Disp: , Rfl:    spironolactone (ALDACTONE) 25 MG tablet, Take 0.5 tablets (12.5 mg total) by mouth daily., Disp: 30 tablet, Rfl: 1   Camphor-Menthol-Methyl Sal (SALONPAS) 3.06-11-08 % PTCH, Place 1 patch onto the skin daily as needed (pain). (Patient not taking: Reported on 12/21/2022), Disp: , Rfl:    enoxaparin (LOVENOX) 60  MG/0.6ML injection, Inject 0.6 mLs (60 mg total) into the skin every 12 (twelve) hours for 2 days. Hold Lovenox 12 hours before EGD., Disp: 2.4 mL, Rfl: 0  I spent 62 minutes dedicated to the care of this patient on the date of this encounter to include pre-visit review of records, face-to-face time with the patient discussing conditions above, post visit ordering of testing, clinical documentation with the electronic health record, making appropriate referrals as documented, and communicating necessary findings to members of the patients care team.   Josephine Igo, DO [facility] 12/22/2022 1:30 PM

## 2022-12-22 NOTE — Telephone Encounter (Signed)
Received call from pt's daughter, Gypsy Lore. Her mother was seen in the diagnostic clinic on 12/06/22. She is to have biopsy done on 01/09/23, has had PET scan completed. [name] states her mother continues to have low back pain. She was prescribed oxycodone on discharge from the hospital. She has only 5 tablets left. She is requesting a refill. Last filled: oxycodone 5mg  #20 on 11/26/22. Refill should go to [facility] on [address]. Pt is staying with daughter in [place] at this time.

## 2022-12-22 NOTE — H&P (View-Only) (Signed)
Synopsis: Referred in July 2024 for lung nodule by Renaye Rakers, MD  Subjective:   PATIENT ID: Erin Pacheco GENDER: female DOB: 02/22/1951, MRN: [id number]  Chief Complaint  Patient presents with   Hospitalization Follow-up    Hosp. F/up    This is a 72 year old female that was referred for evaluation of a lung nodule past medical history of hypertension and tobacco use.  Mother with ovarian cancer Brother with throat cancer.  Patient has had a longstanding history of tobacco use.  Patient went to the hospital in June 2024 was found to have 2 bilateral subsegmental PE.  On the CT evaluation found to have a lung mass and what appeared to be a right-sided adrenal mass.  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  Now with a lung nodule in the chest after being seen by oncology is recommending repeat biopsy for consideration of access to the chest lesion to see if were dealing with 2 separate primary locations.    Past Medical History:  Diagnosis Date   Arthritis    Cancer (HCC)    Hypertension    Tobacco abuse      Family History  Problem Relation Age of Onset   Ovarian cancer Mother 12   High blood pressure Mother    Throat cancer Brother        smoker   Liver disease Neg Hx    Esophageal cancer Neg Hx    Colon cancer Neg Hx      Past Surgical History:  Procedure Laterality Date   ABDOMINAL HYSTERECTOMY     BILATERAL OOPHORECTOMY     BIOPSY  12/09/2022   Procedure: BIOPSY;  Surgeon: Imogene Burn, MD;  Location: [facility] ENDOSCOPY;  Service: Gastroenterology;;   CATARACT EXTRACTION W/PHACO  12/27/2010   Procedure: CATARACT EXTRACTION PHACO AND INTRAOCULAR LENS PLACEMENT (IOC);  Surgeon: Gemma Payor;  Location: [facility] ORS;  Service: Ophthalmology;  Laterality: Right;   CATARACT EXTRACTION W/PHACO  03/28/2011   Procedure: CATARACT EXTRACTION PHACO AND INTRAOCULAR LENS PLACEMENT (IOC);  Surgeon: Gemma Payor;  Location:  [facility] ORS;  Service: Ophthalmology;  Laterality: Left;  CDE 7.27   ESOPHAGOGASTRODUODENOSCOPY (EGD) WITH PROPOFOL N/A 12/09/2022   Procedure: ESOPHAGOGASTRODUODENOSCOPY (EGD) WITH PROPOFOL;  Surgeon: Imogene Burn, MD;  Location: [facility] ENDOSCOPY;  Service: Gastroenterology;  Laterality: N/A;    Social History   Socioeconomic History   Marital status: Single    Spouse name: Not on file   Number of children: Not on file   Years of education: Not on file   Highest education level: Not on file  Occupational History   Not on file  Tobacco Use   Smoking status: Former    Current packs/day: 0.00    Average packs/day: 0.5 packs/day for 30.0 years (15.0 ttl pk-yrs)    Types: Cigarettes    Start date: 11/1992    Quit date: 11/2022    Years since quitting: 0.1   Smokeless tobacco: Never  Vaping Use   Vaping status: Never Used  Substance and Sexual Activity   Alcohol use: No   Drug use: No   Sexual activity: Yes    Birth control/protection: Surgical  Other Topics Concern   Not on file  Social History Narrative   Not on file   Social Determinants of Health   Financial Resource Strain: Not on file  Food Insecurity: No Food Insecurity (11/17/2022)   Hunger Vital Sign    Worried  About Running Out of Food in the Last Year: Never true    Ran Out of Food in the Last Year: Never true  Transportation Needs: No Transportation Needs (11/17/2022)   PRAPARE - Administrator, Civil Service (Medical): No    Lack of Transportation (Non-Medical): No  Physical Activity: Not on file  Stress: Not on file  Social Connections: Not on file  Intimate Partner Violence: Not At Risk (11/17/2022)   Humiliation, Afraid, Rape, and Kick questionnaire    Fear of Current or Ex-Partner: No    Emotionally Abused: No    Physically Abused: No    Sexually Abused: No     No Known Allergies   Outpatient Medications Prior to Visit  Medication Sig Dispense Refill   acetaminophen (TYLENOL) 325 MG tablet Take  2 tablets (650 mg total) by mouth every 6 (six) hours as needed for mild pain (or Fever >/= 101).     apixaban (ELIQUIS) 5 MG TABS tablet Take 1 tablet (5 mg total) by mouth 2 (two) times daily. 60 tablet 2   dexamethasone (DECADRON) 4 MG tablet Take 4 mg by mouth daily as needed (For arthritis pain).     fluticasone furoate-vilanterol (BREO ELLIPTA) 100-25 MCG/ACT AEPB Inhale 1 puff into the lungs daily. 28 each 0   losartan (COZAAR) 50 MG tablet Take 1 tablet (50 mg total) by mouth daily. 90 tablet 3   metoprolol succinate (TOPROL-XL) 50 MG 24 hr tablet Take 1 tablet (50 mg total) by mouth daily. Take with or immediately following a meal. 30 tablet 1   Multiple Vitamin (MULTIVITAMIN) tablet Take 1 tablet by mouth daily.       oxyCODONE (OXY IR/ROXICODONE) 5 MG immediate release tablet Take 1 tablet (5 mg total) by mouth every 6 (six) hours as needed for moderate pain. 20 tablet 0   Polyethyl Glycol-Propyl Glycol (SYSTANE OP) Place 1 drop into both eyes daily as needed (dry eye).     spironolactone (ALDACTONE) 25 MG tablet Take 0.5 tablets (12.5 mg total) by mouth daily. 30 tablet 1   Camphor-Menthol-Methyl Sal (SALONPAS) 3.06-11-08 % PTCH Place 1 patch onto the skin daily as needed (pain). (Patient not taking: Reported on 12/21/2022)     enoxaparin (LOVENOX) 60 MG/0.6ML injection Inject 0.6 mLs (60 mg total) into the skin every 12 (twelve) hours for 2 days. Hold Lovenox 12 hours before EGD. 2.4 mL 0   No facility-administered medications prior to visit.    Review of Systems  Constitutional:  Negative for chills, fever, malaise/fatigue and weight loss.  HENT:  Negative for hearing loss, sore throat and tinnitus.   Eyes:  Negative for blurred vision and double vision.  Respiratory:  Positive for shortness of breath. Negative for cough, hemoptysis, sputum production, wheezing and stridor.   Cardiovascular:  Negative for chest pain, palpitations, orthopnea, leg swelling and PND.  Gastrointestinal:   Negative for abdominal pain, constipation, diarrhea, heartburn, nausea and vomiting.  Genitourinary:  Negative for dysuria, hematuria and urgency.  Musculoskeletal:  Negative for joint pain and myalgias.  Skin:  Negative for itching and rash.  Neurological:  Negative for dizziness, tingling, weakness and headaches.  Endo/Heme/Allergies:  Negative for environmental allergies. Does not bruise/bleed easily.  Psychiatric/Behavioral:  Negative for depression. The patient is not nervous/anxious and does not have insomnia.   All other systems reviewed and are negative.    Objective:  Physical Exam Vitals reviewed.  Constitutional:      General: She is not in  acute distress.    Appearance: She is well-developed.  HENT:     Head: Normocephalic and atraumatic.  Eyes:     General: No scleral icterus.    Conjunctiva/sclera: Conjunctivae normal.     Pupils: Pupils are equal, round, and reactive to light.  Neck:     Vascular: No JVD.     Trachea: No tracheal deviation.  Cardiovascular:     Rate and Rhythm: Normal rate and regular rhythm.     Heart sounds: Normal heart sounds. No murmur heard. Pulmonary:     Effort: Pulmonary effort is normal. No tachypnea, accessory muscle usage or respiratory distress.     Breath sounds: No stridor. No wheezing, rhonchi or rales.  Abdominal:     General: Bowel sounds are normal. There is no distension.     Palpations: Abdomen is soft.     Tenderness: There is no abdominal tenderness.  Musculoskeletal:        General: No tenderness.     Cervical back: Neck supple.  Lymphadenopathy:     Cervical: No cervical adenopathy.  Skin:    General: Skin is warm and dry.     Capillary Refill: Capillary refill takes less than 2 seconds.     Findings: No rash.  Neurological:     Mental Status: She is alert and oriented to person, place, and time.  Psychiatric:        Behavior: Behavior normal.      Vitals:   12/22/22 1309  BP: 120/70  Pulse: 74  SpO2:  95%  Weight: 130 lb 12.8 oz (59.3 kg)  Height: 5\' 3"  (1.6 m)   95% on RA BMI Readings from Last 3 Encounters:  12/22/22 23.17 kg/m  12/21/22 23.03 kg/m  12/09/22 24.02 kg/m   Wt Readings from Last 3 Encounters:  12/22/22 130 lb 12.8 oz (59.3 kg)  12/21/22 130 lb (59 kg)  12/09/22 135 lb 9.3 oz (61.5 kg)     CBC    Component Value Date/Time   WBC 9.2 12/21/2022 1655   WBC 10.2 12/06/2022 1454   WBC 13.0 (H) 11/26/2022 0435   RBC 3.18 (L) 12/21/2022 1655   RBC 3.31 (L) 12/06/2022 1454   HGB 9.0 (L) 12/21/2022 1655   HCT 26.3 (L) 12/21/2022 1655   PLT 512 (H) 12/21/2022 1655   MCV 83 12/21/2022 1655   MCH 28.3 12/21/2022 1655   MCH 29.3 12/06/2022 1454   MCHC 34.2 12/21/2022 1655   MCHC 32.9 12/06/2022 1454   RDW 14.6 12/21/2022 1655   LYMPHSABS 2.9 12/06/2022 1454   MONOABS 0.8 12/06/2022 1454   EOSABS 0.2 12/06/2022 1454   BASOSABS 0.1 12/06/2022 1454     Chest Imaging:  July 2024 nuclear medicine pet imaging: Patient with a hypermetabolic lesion in the right adrenal gland as well as 1 in the left upper lobe. The patient's images have been independently reviewed by me.    Pulmonary Functions Testing Results:     No data to display          FeNO:   Pathology:   Echocardiogram:   Heart Catheterization:     Assessment & Plan:     ICD-10-CM   1. Lung nodule  R91.1 Ambulatory referral to Pulmonology    Procedural/ Surgical Case Request: ROBOTIC ASSISTED NAVIGATIONAL BRONCHOSCOPY    2. Multiple subsegmental pulmonary emboli without acute cor pulmonale (HCC)  I26.94     3. Adrenal mass, right (HCC)  E27.8  Discussion:  This is a 72 year old female longstanding history of tobacco use found to have a pulmonary nodule, right adrenal mass and subsegmental PE.  Plan: She has had several weeks of anticoagulation and do think it is okay to hold her anticoagulation for procedure. Especially since she had lower extremity Dopplers that were  negative for DVT. We will hold her [facility] for 2 days prior to the procedure and then restart. She is not on any diabetic medications. Will recommend robotic assisted navigational bronchoscopy tissue sampling of the left upper lobe pulmonary nodule. Tentative bronchoscopy date will be on 01/09/2023.  Bronchoscopy 1 week follow-up with SG, NP after procedure complete.  We appreciate PCC's help with scheduling.   Current Outpatient Medications:    acetaminophen (TYLENOL) 325 MG tablet, Take 2 tablets (650 mg total) by mouth every 6 (six) hours as needed for mild pain (or Fever >/= 101)., Disp: , Rfl:    apixaban (ELIQUIS) 5 MG TABS tablet, Take 1 tablet (5 mg total) by mouth 2 (two) times daily., Disp: 60 tablet, Rfl: 2   dexamethasone (DECADRON) 4 MG tablet, Take 4 mg by mouth daily as needed (For arthritis pain)., Disp: , Rfl:    fluticasone furoate-vilanterol (BREO ELLIPTA) 100-25 MCG/ACT AEPB, Inhale 1 puff into the lungs daily., Disp: 28 each, Rfl: 0   losartan (COZAAR) 50 MG tablet, Take 1 tablet (50 mg total) by mouth daily., Disp: 90 tablet, Rfl: 3   metoprolol succinate (TOPROL-XL) 50 MG 24 hr tablet, Take 1 tablet (50 mg total) by mouth daily. Take with or immediately following a meal., Disp: 30 tablet, Rfl: 1   Multiple Vitamin (MULTIVITAMIN) tablet, Take 1 tablet by mouth daily.  , Disp: , Rfl:    oxyCODONE (OXY IR/ROXICODONE) 5 MG immediate release tablet, Take 1 tablet (5 mg total) by mouth every 6 (six) hours as needed for moderate pain., Disp: 20 tablet, Rfl: 0   Polyethyl Glycol-Propyl Glycol (SYSTANE OP), Place 1 drop into both eyes daily as needed (dry eye)., Disp: , Rfl:    spironolactone (ALDACTONE) 25 MG tablet, Take 0.5 tablets (12.5 mg total) by mouth daily., Disp: 30 tablet, Rfl: 1   Camphor-Menthol-Methyl Sal (SALONPAS) 3.06-11-08 % PTCH, Place 1 patch onto the skin daily as needed (pain). (Patient not taking: Reported on 12/21/2022), Disp: , Rfl:    enoxaparin (LOVENOX) 60  MG/0.6ML injection, Inject 0.6 mLs (60 mg total) into the skin every 12 (twelve) hours for 2 days. Hold Lovenox 12 hours before EGD., Disp: 2.4 mL, Rfl: 0  I spent 62 minutes dedicated to the care of this patient on the date of this encounter to include pre-visit review of records, face-to-face time with the patient discussing conditions above, post visit ordering of testing, clinical documentation with the electronic health record, making appropriate referrals as documented, and communicating necessary findings to members of the patients care team.   Josephine Igo, DO [facility] 12/22/2022 1:30 PM

## 2022-12-22 NOTE — Patient Instructions (Signed)
Thank you for visiting Dr. Tonia Brooms at [facility]. Today we recommend the following:  Orders Placed This Encounter  Procedures   Procedural/ Surgical Case Request: ROBOTIC ASSISTED NAVIGATIONAL BRONCHOSCOPY   Ambulatory referral to Pulmonology   Bronchoscopy on 01/09/2023  Return in about 25 days (around 01/16/2023) for with Kandice Robinsons, NP, after Bronchoscopy.    Please do your part to reduce the spread of COVID-19.

## 2023-01-06 ENCOUNTER — Other Ambulatory Visit: Payer: Self-pay

## 2023-01-06 ENCOUNTER — Encounter (HOSPITAL_COMMUNITY): Payer: Self-pay | Admitting: Pulmonary Disease

## 2023-01-06 NOTE — Anesthesia Preprocedure Evaluation (Signed)
Anesthesia Evaluation  Patient identified by MRN, date of birth, ID band Patient awake    Reviewed: Allergy & Precautions, NPO status , Patient's Chart, lab work & pertinent test results  History of Anesthesia Complications Negative for: history of anesthetic complications  Airway Mallampati: I  TM Distance: >3 FB Neck ROM: Full    Dental  (+) Edentulous Upper, Edentulous Lower   Pulmonary COPD, former smoker, PE CTA showed LUL spiculated lung lesion and segmental PE, as well as possible adrenal mets.   breath sounds clear to auscultation       Cardiovascular hypertension, Pt. on medications and Pt. on home beta blockers  Rhythm:Regular Rate:Normal  6.2024 ECHO: EF 30-35%, mod decreased LVF with global hypokinesis, normal RVF, small pericardial effusion, mild-mod AI   Neuro/Psych    GI/Hepatic   Endo/Other  Adrenal cancer  Renal/GU      Musculoskeletal   Abdominal   Peds  Hematology eliquis   Anesthesia Other Findings   Reproductive/Obstetrics                             Anesthesia Physical Anesthesia Plan  ASA: 3  Anesthesia Plan: General   Post-op Pain Management: Tylenol PO (pre-op)*   Induction: Intravenous  PONV Risk Score and Plan: 3 and Ondansetron, Dexamethasone and Treatment may vary due to age or medical condition  Airway Management Planned: Oral ETT  Additional Equipment: None  Intra-op Plan:   Post-operative Plan:   Informed Consent:   Plan Discussed with:   Anesthesia Plan Comments: (See PAT note written 01/06/2023 by Shonna Chock, [name].  )       Anesthesia Quick Evaluation

## 2023-01-06 NOTE — Progress Notes (Signed)
PCP - Renaye Rakers MD Cardiologist - Rollene Rotunda, MD  EKG - 11/16/2022. N Ordered for DOS in PACU Chest x-ray - 11/16/2022 ECHO - 11/23/2022  Cardiac Cath - Denies  Sleep Study-   DM- Denies   Blood Thinner Instructions: Eliquis.  Per MD last dose on 8/2 Aspirin Instructions: N/A  ERAS Protcol - No.  NPO ordered COVID TEST- N/A  Anesthesia review: Yes, cardiac hx  -------------  SDW INSTRUCTIONS:  Your procedure is scheduled on Monday Aug 5th. Please report to [facility] Main Entrance "A" at 0800 A.M., and check in at the Admitting office. Call this number if you have problems the morning of surgery: [phone]   Remember: Do not eat or drink after midnight the night before your surgery    Medications to take morning of surgery with a sip of water include: fluticasone furoate-vilanterol (BREO ELLIPTA), metoprolol succinate (TOPROL-XL  PRN acetaminophen (TYLENOL) , dexamethasone (DECADRON) , ), oxyCODONE (OXY IR/ROXICODONE) 5 MG   As of today, STOP taking any Aspirin (unless otherwise instructed by your surgeon), Aleve, Naproxen, Ibuprofen, Motrin, Advil, Goody's, BC's, all herbal medications, fish oil, and all vitamins.    The Morning of Surgery Do not wear jewelry, make-up or nail polish. Do not wear lotions, powders, or perfumes/colognes, or deodorant Do not bring valuables to the hospital. [facility] is not responsible for any belongings or valuables.  If you are a smoker, DO NOT Smoke 24 hours prior to surgery  If you wear a CPAP at night please bring your mask the morning of surgery   Remember that you must have someone to transport you home after your surgery, and remain with you for 24 hours if you are discharged the same day.  Please bring cases for contacts, glasses, hearing aids, dentures or bridgework because it cannot be worn into surgery.   Patients discharged the day of surgery will not be allowed to drive home.   Please shower the NIGHT  BEFORE/MORNING OF SURGERY (use antibacterial soap like DIAL soap if possible). Wear comfortable clothes the morning of surgery. Oral Hygiene is also important to reduce your risk of infection.  Remember - BRUSH YOUR TEETH THE MORNING OF SURGERY WITH YOUR REGULAR TOOTHPASTE  Patient denies shortness of breath, fever, cough and chest pain.

## 2023-01-06 NOTE — Progress Notes (Signed)
Anesthesia Chart Review: SAME DAY WORK-UP  Case: [id number] Date/Time: 01/09/23 1000   Procedure: ROBOTIC ASSISTED NAVIGATIONAL BRONCHOSCOPY (Left)   Anesthesia type: General   Diagnosis: Lung nodule [R91.1]   Pre-op diagnosis: lung nodule   [facility] / [facility]   Surgeons: Josephine Igo, DO       DISCUSSION: [name] scheduled for the above procedure.  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 Initially evaluated for pleuritic type left chest pain. CTA showed LUL spiculated lung lesion and segmental PE, as well as possible adrenal mets. Chronic LBBB. Troponins negative x 2. 11/16/22 echo showed new LV dysfunction with LVEF 30-35%, global hypokinesis, normal RV, small pericardial effusion. She was transferred to [facility] for further evaluation. Per hospitalization summary outlined by Azalee Course, PA, "Patient was first seen by cardiology service on 11/17/2022 during hospital admission for persistent dry cough.  Serial troponin was negative x 2.  D-dimer was elevated.  A CTA showed segmental PE in the right upper lobe greater than left lower lobe, small amount of clot burden, emphysematous lung changes with spiculated left upper lobe lung mass measuring 2.5 cm, there was also right adrenal mass measuring 4.5 x 2.9 cm.  Image was concerning for malignancy.  Patient was subsequently transferred to [facility] for further workup.  Echocardiogram obtained on 11/16/2022 showed EF 30 to 35%, global hypokinesis, normal RV, small circumferential pericardial effusion, mild LAE, mild to moderate AI.  Patient was seen by Dr. Antoine Poche, she was placed on Toprol-XL, ARB and spironolactone.  After adrenal biopsy, she was started on Eliquis.  Lower extremity venous  Doppler was negative for DVT.  MRI of the brain was negative for metastasis or acute intracranial abnormality.  Bone scan was negative for metastasis.  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  MRCP revealed large right adrenal mass with evidence of hemorrhage." In hopes to figure out the source of metastatic adrenal cancer she underwent EGD on 12/09/22 showing 6 nonbleeding angioectasias in the duodenum, single duodenal polyp and duodenal lipoma which were biopsied and negative for malignancy. Since primary source of malignancy still unclear, she was referred to pulmonology for bronchoscopy.  As mentioned above, cardiologist Dr. Acquanetta Sit consulted during her June admission for new cardiomyopathy of unclear etiology. Chest pain felt overall atypical, and in setting of PE and LUL lung nodule. No palpitations, syncope, or new SOB and was still working as a Water quality scientist. Could consider ischemic testing in the future but "Given potential malignancy and acute PE, patient is not a good candidate for invasive study." She was undergoing work-up then with pending biopsies. He recommended GDMT titration and repeat echocardiogram in 2 to 3 months. She had most recent cardiology follow-up with Azalee Course, PA on 12/21/22. She denied chest pain, palpitations, dyspnea, orthopnea, syncope, edema. Continue metoprolol succinate, losartan, and spironolactone with plan for 3 month follow-up and repeat echo in September 2024.   She reported instructions to hold Eliquis after 01/06/23 dose.  Anesthesia team to evaluate on the day of surgery.   VS: Ht 5\' 3"  (1.6 m)   Wt 60.3 kg   BMI 23.56 kg/m  BP Readings from Last 3 Encounters:  12/22/22 120/70  12/21/22 132/70  12/09/22 127/61   Pulse Readings from  Last 3 Encounters:  12/22/22 74  12/21/22 73  12/09/22 70     PROVIDERS: Renaye Rakers, MD is PCP  Rollene Rotunda, MD  is cardiologist Arlan Organ, MD is HEM-ONC   LABS: Most recent lab results in [facility]: Lab Results  Component Value Date   WBC 9.2 12/21/2022   HGB 9.0 (L) 12/21/2022   HCT 26.3 (L) 12/21/2022   PLT 512 (H) 12/21/2022   GLUCOSE 139 (H) 12/06/2022   ALT 16 12/06/2022   AST 12 (L) 12/06/2022   NA 140 12/06/2022   K 3.6 12/06/2022   CL 107 12/06/2022   CREATININE 0.90 12/06/2022   BUN 12 12/06/2022   CO2 25 12/06/2022   INR 1.3 (H) 11/18/2022     IMAGES: PET Scan 12/15/22: IMPRESSION: 1. Hypermetabolic spiculated left upper lobe lung nodule, concerning for primary lung malignancy. 2. Hypermetabolic large right adrenal gland mass, likely metastatic disease. 3. Additional small solid pulmonary nodule of the left upper lobe with no significant FDG uptake, likely due to small size pulmonary metastatic disease can not be excluded. Recommend attention on follow-up. 4. Subtle peripheral opacity of the left lower lobe with mild FDG uptake, likely resolving pulmonary infarct. 5. Aortic Atherosclerosis (ICD10-I70.0) and Emphysema (ICD10-J43.9).   MR Abd 12/04/22: IMPRESSION: 1. Large right adrenal mass which has imaging findings concerning for malignancy. There is also evidence of hemorrhage within the mass, which extends into the right perinephric space, as above. 2. 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 3. Cardiomegaly with left ventricular dilatation.  MRI Brain 11/18/22: IMPRESSION: No metastatic disease or acute intracranial abnormality. Normal for age MRI appearance of the brain.  CT Abd/pelvis 11/17/22: IMPRESSION: 1. 5.2 cm right adrenal mass is suspicious for malignancy. Biopsy or other appropriate confirmation for example with PET-CT imaging would be suggested. 2. Aortic atherosclerosis. 3. Substantial impingement at all levels between L2 and S1 due to scoliosis, spondylosis, and degenerative disc disease. 4. Small bilateral groin hernias contain adipose  tissue. 5. Mild cardiomegaly. 6. Atelectasis along both lung bases, mildly increased in the right middle lobe compared to previous, and with some hazy non dependent peripheral opacity in the left lower lobe similar to previous. Pneumonia is not excluded.  CTA Chest 11/16/22: IMPRESSION: - Segmental pulmonary emboli right upper lobe greater than left lower lobe. Small amount of clot burden. - Mildly enlarged heart with a small pericardial effusion. - Emphysematous lung changes are seen with a spiculated left upper lobe mass measuring 2.5 cm. Separate 7 mm lingular lesion. - Right adrenal mass identified at the edge of the imaging field. Malignant or metastatic lesion is possible. Recommend overall further evaluation. PET-CT scan may be useful as the next step in the workup versus sampling. - Tiny left pleural effusion - Aortic Atherosclerosis (ICD10-I70.0) and Emphysema (ICD10-J43.9).   EKG: 11/16/22: Normal sinus rhythm Left bundle branch block Abnormal ECG No significant change since last tracing Confirmed by Vonita Moss [phone]) on 11/17/2022 10:43:46 AM   CV: Echo 11/23/22: IMPRESSIONS   1. Left ventricular ejection fraction, by estimation, is 30 to 35%. The  left ventricle has moderately decreased function. The left ventricle  demonstrates global hypokinesis. Left ventricular diastolic parameters are  indeterminate.   2. Right ventricular systolic function is normal. The right ventricular  size is normal.   3. Left atrial size was mildly dilated.   4. A small pericardial effusion is present. The pericardial effusion is  circumferential.   5. The mitral valve is normal in structure. No  evidence of mitral valve  regurgitation. No evidence of mitral stenosis.   6. The aortic valve was not well visualized. Aortic valve regurgitation  is mild to moderate.  - Comparison LVEF 55-65% 10/16/01   LE Venous US 11/17/22: Summary:  RIGHT:  - There is no evidence of deep vein  thrombosis in the lower extremity.  - No cystic structure found in the popliteal fossa.  LEFT:  - There is no evidence of deep vein thrombosis in the lower extremity.  - No cystic structure found in the popliteal fossa.    Past Medical History:  Diagnosis Date   Arthritis    Cancer [facility])    right adrenal adenocarcinoma 11/23/22   Cardiomyopathy (HCC)    COPD (chronic obstructive pulmonary disease) (HCC)    Hypertension    Pulmonary embolism (HCC) 11/16/2022   Tobacco abuse     Past Surgical History:  Procedure Laterality Date   ABDOMINAL HYSTERECTOMY     BILATERAL OOPHORECTOMY     BIOPSY  12/09/2022   Procedure: BIOPSY;  Surgeon: Imogene Burn, MD;  Location: [facility] ENDOSCOPY;  Service: Gastroenterology;;   CATARACT EXTRACTION W/PHACO  12/27/2010   Procedure: CATARACT EXTRACTION PHACO AND INTRAOCULAR LENS PLACEMENT (IOC);  Surgeon: Gemma Payor;  Location: [facility] ORS;  Service: Ophthalmology;  Laterality: Right;   CATARACT EXTRACTION W/PHACO  03/28/2011   Procedure: CATARACT EXTRACTION PHACO AND INTRAOCULAR LENS PLACEMENT (IOC);  Surgeon: Gemma Payor;  Location: [facility] ORS;  Service: Ophthalmology;  Laterality: Left;  CDE 7.27   ESOPHAGOGASTRODUODENOSCOPY (EGD) WITH PROPOFOL N/A 12/09/2022   Procedure: ESOPHAGOGASTRODUODENOSCOPY (EGD) WITH PROPOFOL;  Surgeon: Imogene Burn, MD;  Location: [facility] ENDOSCOPY;  Service: Gastroenterology;  Laterality: N/A;    MEDICATIONS: No current facility-administered medications for this encounter.    acetaminophen (TYLENOL) 325 MG tablet   apixaban (ELIQUIS) 5 MG TABS tablet   Camphor-Menthol-Methyl Sal (SALONPAS) 3.06-11-08 % PTCH   dexamethasone (DECADRON) 4 MG tablet   enoxaparin (LOVENOX) 60 MG/0.6ML injection   fluticasone furoate-vilanterol (BREO ELLIPTA) 100-25 MCG/ACT AEPB   losartan (COZAAR) 50 MG tablet   metoprolol succinate (TOPROL-XL) 50 MG 24 hr tablet   Multiple Vitamin (MULTIVITAMIN) tablet   oxyCODONE (OXY IR/ROXICODONE) 5 MG immediate release  tablet   Polyethyl Glycol-Propyl Glycol (SYSTANE OP)   spironolactone (ALDACTONE) 25 MG tablet    Shonna Chock, [name] Surgical Short Stay/Anesthesiology [facility] Phone [phone] [facility] Phone [phone] 01/06/2023 2:28 PM

## 2023-01-06 NOTE — Progress Notes (Signed)
Spoke with the pt, she will arrive on Mon at 0730. NPO post mn.

## 2023-01-09 ENCOUNTER — Other Ambulatory Visit: Payer: Self-pay

## 2023-01-09 ENCOUNTER — Ambulatory Visit (HOSPITAL_COMMUNITY)
Admission: RE | Admit: 2023-01-09 | Discharge: 2023-01-09 | Disposition: A | Discharge status: Home / Self Care | Payer: Medicare HMO | Attending: Pulmonary Disease | Admitting: Pulmonary Disease

## 2023-01-09 ENCOUNTER — Encounter (HOSPITAL_COMMUNITY)
Admission: RE | Disposition: A | Discharge status: Home / Self Care | Payer: Self-pay | Source: Home / Self Care | Attending: Pulmonary Disease

## 2023-01-09 ENCOUNTER — Encounter (HOSPITAL_COMMUNITY): Payer: Self-pay | Admitting: Pulmonary Disease

## 2023-01-09 ENCOUNTER — Ambulatory Visit (HOSPITAL_COMMUNITY): Payer: Self-pay | Admitting: Vascular Surgery

## 2023-01-09 ENCOUNTER — Ambulatory Visit (HOSPITAL_COMMUNITY): Payer: Medicare HMO

## 2023-01-09 ENCOUNTER — Ambulatory Visit (HOSPITAL_BASED_OUTPATIENT_CLINIC_OR_DEPARTMENT_OTHER): Payer: Medicare HMO | Admitting: Vascular Surgery

## 2023-01-09 DIAGNOSIS — Z09 Encounter for follow-up examination after completed treatment for conditions other than malignant neoplasm: Secondary | ICD-10-CM | POA: Diagnosis not present

## 2023-01-09 DIAGNOSIS — M5136 Other intervertebral disc degeneration, lumbar region: Secondary | ICD-10-CM | POA: Insufficient documentation

## 2023-01-09 DIAGNOSIS — I447 Left bundle-branch block, unspecified: Secondary | ICD-10-CM | POA: Insufficient documentation

## 2023-01-09 DIAGNOSIS — I3139 Other pericardial effusion (noninflammatory): Secondary | ICD-10-CM | POA: Diagnosis not present

## 2023-01-09 DIAGNOSIS — Z800 Family history of malignant neoplasm of digestive organs: Secondary | ICD-10-CM | POA: Diagnosis not present

## 2023-01-09 DIAGNOSIS — I5021 Acute systolic (congestive) heart failure: Secondary | ICD-10-CM | POA: Diagnosis not present

## 2023-01-09 DIAGNOSIS — R911 Solitary pulmonary nodule: Secondary | ICD-10-CM

## 2023-01-09 DIAGNOSIS — J439 Emphysema, unspecified: Secondary | ICD-10-CM | POA: Insufficient documentation

## 2023-01-09 DIAGNOSIS — I700 Atherosclerosis of aorta: Secondary | ICD-10-CM | POA: Insufficient documentation

## 2023-01-09 DIAGNOSIS — I429 Cardiomyopathy, unspecified: Secondary | ICD-10-CM | POA: Insufficient documentation

## 2023-01-09 DIAGNOSIS — I351 Nonrheumatic aortic (valve) insufficiency: Secondary | ICD-10-CM | POA: Diagnosis not present

## 2023-01-09 DIAGNOSIS — I10 Essential (primary) hypertension: Secondary | ICD-10-CM | POA: Diagnosis not present

## 2023-01-09 DIAGNOSIS — C3412 Malignant neoplasm of upper lobe, left bronchus or lung: Secondary | ICD-10-CM | POA: Insufficient documentation

## 2023-01-09 DIAGNOSIS — Z7901 Long term (current) use of anticoagulants: Secondary | ICD-10-CM | POA: Diagnosis not present

## 2023-01-09 DIAGNOSIS — I2699 Other pulmonary embolism without acute cor pulmonale: Secondary | ICD-10-CM | POA: Diagnosis not present

## 2023-01-09 DIAGNOSIS — Z8041 Family history of malignant neoplasm of ovary: Secondary | ICD-10-CM | POA: Insufficient documentation

## 2023-01-09 DIAGNOSIS — Z86711 Personal history of pulmonary embolism: Secondary | ICD-10-CM | POA: Diagnosis not present

## 2023-01-09 DIAGNOSIS — Z90710 Acquired absence of both cervix and uterus: Secondary | ICD-10-CM | POA: Insufficient documentation

## 2023-01-09 DIAGNOSIS — C3432 Malignant neoplasm of lower lobe, left bronchus or lung: Secondary | ICD-10-CM | POA: Diagnosis not present

## 2023-01-09 DIAGNOSIS — I110 Hypertensive heart disease with heart failure: Secondary | ICD-10-CM | POA: Diagnosis not present

## 2023-01-09 DIAGNOSIS — Z87891 Personal history of nicotine dependence: Secondary | ICD-10-CM | POA: Diagnosis not present

## 2023-01-09 DIAGNOSIS — C7971 Secondary malignant neoplasm of right adrenal gland: Secondary | ICD-10-CM | POA: Diagnosis not present

## 2023-01-09 DIAGNOSIS — J90 Pleural effusion, not elsewhere classified: Secondary | ICD-10-CM | POA: Insufficient documentation

## 2023-01-09 DIAGNOSIS — J9811 Atelectasis: Secondary | ICD-10-CM | POA: Diagnosis not present

## 2023-01-09 DIAGNOSIS — E279 Disorder of adrenal gland, unspecified: Secondary | ICD-10-CM | POA: Diagnosis not present

## 2023-01-09 HISTORY — DX: Cardiomyopathy, unspecified: I42.9

## 2023-01-09 HISTORY — PX: BRONCHIAL NEEDLE ASPIRATION BIOPSY: SHX5106

## 2023-01-09 HISTORY — DX: Chronic obstructive pulmonary disease, unspecified: J44.9

## 2023-01-09 HISTORY — PX: BRONCHIAL BIOPSY: SHX5109

## 2023-01-09 LAB — BASIC METABOLIC PANEL
Anion gap: 12 (ref 5–15)
BUN: 7 mg/dL — ABNORMAL LOW (ref 8–23)
CO2: 26 mmol/L (ref 22–32)
Calcium: 8.9 mg/dL (ref 8.9–10.3)
Chloride: 100 mmol/L (ref 98–111)
Creatinine, Ser: 1.04 mg/dL — ABNORMAL HIGH (ref 0.44–1.00)
GFR, Estimated: 57 mL/min — ABNORMAL LOW (ref >60)
Glucose, Bld: 104 mg/dL — ABNORMAL HIGH (ref 70–99)
Potassium: 3.8 mmol/L (ref 3.5–5.1)
Sodium: 138 mmol/L (ref 135–145)

## 2023-01-09 SURGERY — BRONCHOSCOPY, WITH BIOPSY USING ELECTROMAGNETIC NAVIGATION
Anesthesia: General | Laterality: Left

## 2023-01-09 MED ORDER — PROMETHAZINE HCL 25 MG/ML IJ SOLN: 6.2500 mg | INTRAMUSCULAR | Status: DC | PRN

## 2023-01-09 MED ORDER — ROCURONIUM BROMIDE 10 MG/ML (PF) SYRINGE
PREFILLED_SYRINGE | INTRAVENOUS | Status: DC | PRN
  Administered 2023-01-09: 60 mg via INTRAVENOUS

## 2023-01-09 MED ORDER — PHENYLEPHRINE 80 MCG/ML (10ML) SYRINGE FOR IV PUSH (FOR BLOOD PRESSURE SUPPORT)
PREFILLED_SYRINGE | INTRAVENOUS | Status: DC | PRN
  Administered 2023-01-09: 80 ug via INTRAVENOUS

## 2023-01-09 MED ORDER — PROPOFOL 500 MG/50ML IV EMUL
INTRAVENOUS | Status: DC | PRN
  Administered 2023-01-09: 125 ug/kg/min via INTRAVENOUS

## 2023-01-09 MED ORDER — MEPERIDINE HCL 25 MG/ML IJ SOLN: 6.2500 mg | INTRAMUSCULAR | Status: DC | PRN

## 2023-01-09 MED ORDER — CHLORHEXIDINE GLUCONATE 0.12 % MT SOLN
15.0000 mL | Freq: Once | OROMUCOSAL | Status: AC
  Administered 2023-01-09: 15 mL via OROMUCOSAL
  Filled 2023-01-09: qty 15

## 2023-01-09 MED ORDER — ACETAMINOPHEN 500 MG PO TABS
1000.0000 mg | ORAL_TABLET | Freq: Once | ORAL | Status: AC
  Administered 2023-01-09: 1000 mg via ORAL
  Filled 2023-01-09: qty 2

## 2023-01-09 MED ORDER — SUGAMMADEX SODIUM 200 MG/2ML IV SOLN
INTRAVENOUS | Status: DC | PRN
  Administered 2023-01-09: 200 mg via INTRAVENOUS

## 2023-01-09 MED ORDER — LACTATED RINGERS IV SOLN: INTRAVENOUS | Status: DC

## 2023-01-09 MED ORDER — FENTANYL CITRATE (PF) 100 MCG/2ML IJ SOLN: 25.0000 ug | INTRAMUSCULAR | Status: DC | PRN

## 2023-01-09 MED ORDER — DEXAMETHASONE SODIUM PHOSPHATE 10 MG/ML IJ SOLN
INTRAMUSCULAR | Status: DC | PRN
  Administered 2023-01-09: 4 mg via INTRAVENOUS

## 2023-01-09 MED ORDER — PROPOFOL 10 MG/ML IV BOLUS
INTRAVENOUS | Status: DC | PRN
  Administered 2023-01-09: 80 mg via INTRAVENOUS
  Administered 2023-01-09: 20 mg via INTRAVENOUS

## 2023-01-09 MED ORDER — PHENYLEPHRINE HCL-NACL 20-0.9 MG/250ML-% IV SOLN
INTRAVENOUS | Status: DC | PRN
  Administered 2023-01-09: 25 ug/min via INTRAVENOUS

## 2023-01-09 MED ORDER — LIDOCAINE 2% (20 MG/ML) 5 ML SYRINGE
INTRAMUSCULAR | Status: DC | PRN
  Administered 2023-01-09: 30 mg via INTRAVENOUS

## 2023-01-09 MED ORDER — OXYCODONE HCL 5 MG PO TABS: 5.0000 mg | ORAL_TABLET | Freq: Once | ORAL | Status: DC | PRN

## 2023-01-09 MED ORDER — OXYCODONE HCL 5 MG/5ML PO SOLN: 5.0000 mg | Freq: Once | ORAL | Status: DC | PRN

## 2023-01-09 MED ORDER — ONDANSETRON HCL 4 MG/2ML IJ SOLN
INTRAMUSCULAR | Status: DC | PRN
  Administered 2023-01-09: 4 mg via INTRAVENOUS

## 2023-01-09 MED ORDER — MIDAZOLAM HCL 2 MG/2ML IJ SOLN: 0.5000 mg | Freq: Once | INTRAMUSCULAR | Status: DC | PRN

## 2023-01-09 NOTE — Anesthesia Postprocedure Evaluation (Signed)
Anesthesia Post Note  Patient: Erin Pacheco  Procedure(s) Performed: ROBOTIC ASSISTED NAVIGATIONAL BRONCHOSCOPY (Left) BRONCHIAL NEEDLE ASPIRATION BIOPSIES BRONCHIAL BIOPSIES     Patient location during evaluation: PACU Anesthesia Type: General Level of consciousness: awake and alert, patient cooperative and oriented Pain management: pain level controlled Vital Signs Assessment: post-procedure vital signs reviewed and stable Respiratory status: spontaneous breathing, nonlabored ventilation and respiratory function stable Cardiovascular status: blood pressure returned to baseline and stable Postop Assessment: no apparent nausea or vomiting Anesthetic complications: no   No notable events documented.  Last Vitals:  Vitals:   01/09/23 1000 01/09/23 1015  BP: 129/62 123/67  Pulse: 73 70  Resp: (!) 24 (!) 21  Temp:  (!) 36.2 C  SpO2: 92% 94%    Last Pain:  Vitals:   01/09/23 1015  TempSrc:   PainSc: 0-No pain                 ,E. 

## 2023-01-09 NOTE — Interval H&P Note (Signed)
History and Physical Interval Note:  01/09/2023 9:13 AM  Erin Pacheco  has presented today for surgery, with the diagnosis of lung nodule.  The various methods of treatment have been discussed with the patient and family. After consideration of risks, benefits and other options for treatment, the patient has consented to  Procedure(s): ROBOTIC ASSISTED NAVIGATIONAL BRONCHOSCOPY (Left) as a surgical intervention.  The patient's history has been reviewed, patient examined, no change in status, stable for surgery.  I have reviewed the patient's chart and labs.  Questions were answered to the patient's satisfaction.     [name] 

## 2023-01-09 NOTE — Research (Signed)
Title: A multi-center, prospective, single-arm, observational study to evaluate real-world outcomes for the shape-sensing Ion endoluminal system  Primary Outcome: Evaluate procedure characteristics and short and long-term patient outcomes following shape-sensing robotic-assisted bronchoscopy (ssRAB) utilizing the Ion Endoluminal System for lung lesion localization or biopsy.   Protocol # / Study Name: ISI-ION-003 Clinical Trials #: [id number] Sponsor: [employer] Principal Investigator: Dr. Elige Radon Icard  Key Features of Ion Endoluminal System (referred to as "Ion") Ion is the Pacheco FDA cleared bronchoscopy system that uses fiber optic shape sensing technology to inform on location within the airways. Its catheter/tool channel has a smaller outer diameter (3.5 mm) in comparison to conventional bronchoscopes, allowing it to navigate into the smaller airways of the periphery.     Key Inclusion Criteria Subject is 18 years or older at the time of the procedure Subject is a candidate for a planned, elective RAB lung lesion localization or biopsy procedure in which the Ion Endoluminal System is planned to be utilized.  Subject  able to understand and adhere to study requirements and provide informed consent.   Key Exclusion Criteria Subject is under the care of a Museum/gallery exhibitions officer and is unable to provide informed consent on their own accord.  Subject is participating in an interventional research study or research study with investigational agents with an unknown safety profile that would interfere with participation or the results of this study.  Female subjects who are pregnant or nursing at the time of the index Ion procedure, as determined by standard site practices. Subjects that are incarcerated or institutionalized under court order, or other vulnerable populations.    Previous Clinical Trials Since receiving FDA clearance in Feb 2019, Ion has been adopted  commercially by over 226 centers in the [place], and utilized in over 40,000 procedures.  The Pacheco in-human study enrolled 71 subjects with a mean lesion size of 14.8 mm and the overall diagnostic yield was 79.3%, with no adverse events. 17 (58.6%) lesions were reported to have a bronchus sign available on CT imaging.  A multi-center study published results in 2022, with 270 lesions biopsied in 241 patients using Ion. The mean largest cardinal lesion size was 18.86.44mm, and the mean airway generation count was 7.01.6. Asymptomatic pneumothorax occurred in 3.3% of subjects, and 0.8% experienced airway bleeding.   Another study provided preliminary results in 2022, with 87% sensitivity for malignancy, a diagnostic yield of 81%, and a mean lesion size of 16 mm. 75% of biopsy cases were bronchus-sign negative. 4% of subjects experienced pneumothoraces (including those requiring intervention), and 0.8% of subjects experienced airway bleeding requiring wedging or balloon tamponade.  A single-center study captured 131 consecutive procedures of pulmonary biopsy using Ion. The navigational success rate was 98.7%, with an overall diagnostic yield of 81.7%, an overall complication rate of 3%, and a pneumothorax rate of 1.5%.   [facility] @  Clinical Research Coordinator note:   This visit for Subject Erin Pacheco with DOB: Jul 25, 1950 on 01/09/2023 for the above protocol is Visit/Encounter # Pre-procedure, Intra-procedure and Post-procedure, and is for purpose of research.   The consent for this encounter is under:  Protocol Version 1.0 Investigator Brochure Version N/A Consent Version Revision A, dated 14Nov2023 and is currently IRB approved.   Erin Pacheco expressed continued interest and consent in continuing as a study subject. Subject confirmed that there was no change in contact information (e.g. address, telephone, email). Subject thanked for participation in research and contribution to  science. In this  visit 01/09/2023 the subject will be evaluated by Principal Investigator named Dr. Tonia Brooms. This research coordinator has verified that the above investigator is up to date with his/her training logs.   The Subject was informed that the PI continues to have oversight of the subject's visits and course through relevant discussions, reviews, and also specifically of this visit by routing of this note to the PI.  The research study was discussed with the subject in the pre-operative room. The study was explained in detail including all the contents of the informed consent document. The subject was encouraged to ask questions. All questions were answered to their satisfaction. The IRB approved informed consent was signed, and a copy was given to the subject. After obtaining consent, the subject underwent scheduled procedure using the ion endoluminal system. Data collection was completed per protocol. Refer to paper source subject binder for further details.      Signed by  Verdene Lennert Clinical Research Coordinator / Sub-Investigator  [facility]  [place], [place] 9:47 AM 01/09/2023

## 2023-01-09 NOTE — Op Note (Signed)
Video Bronchoscopy with Robotic Assisted Bronchoscopic Navigation   Date of Operation: 01/09/2023   Pre-op Diagnosis: Lung nodule   Post-op Diagnosis: Lung nodule   Surgeon: Josephine Igo, DO   Assistants: None  Anesthesia: General endotracheal anesthesia  Operation: Flexible video fiberoptic bronchoscopy with robotic assistance and biopsies.  Estimated Blood Loss: Minimal  Complications: None  Indications and History: Erin Pacheco is a 72 y.o. female with history of lung nodule, adrenal mass . The risks, benefits, complications, treatment options and expected outcomes were discussed with the patient.  The possibilities of pneumothorax, pneumonia, reaction to medication, pulmonary aspiration, perforation of a viscus, bleeding, failure to diagnose a condition and creating a complication requiring transfusion or operation were discussed with the patient who freely signed the consent.    Description of Procedure: The patient was seen in the Preoperative Area, was examined and was deemed appropriate to proceed.  The patient was taken to [facility] endoscopy room 3, identified as Erin Pacheco and the procedure verified as Flexible Video Fiberoptic Bronchoscopy.  A Time Out was held and the above information confirmed.   Prior to the date of the procedure a high-resolution CT scan of the chest was performed. Utilizing ION software program a virtual tracheobronchial tree was generated to allow the creation of distinct navigation pathways to the patient's parenchymal abnormalities. After being taken to the operating room general anesthesia was initiated and the patient  was orally intubated. The video fiberoptic bronchoscope was introduced via the endotracheal tube and a general inspection was performed which showed normal right and left lung anatomy, aspiration of the bilateral mainstems was completed to remove any remaining secretions. Robotic catheter inserted into patient's endotracheal tube.    Target #1 LUL nodule: The distinct navigation pathways prepared prior to this procedure were then utilized to navigate to patient's lesion identified on CT scan. The robotic catheter was secured into place and the vision probe was withdrawn.  Lesion location was approximated using fluoroscopy and for peripheral targeting. Under fluoroscopic guidance transbronchial needle biopsies, and transbronchial forceps biopsies were performed to be sent for cytology and pathology.  At the end of the procedure a general airway inspection was performed and there was no evidence of active bleeding. The bronchoscope was removed.  The patient tolerated the procedure well. There was no significant blood loss and there were no obvious complications. A post-procedural chest x-ray is pending.  Samples Target #1: 1. Transbronchial Wang needle biopsies from LUL 2. Transbronchial forceps biopsies from LUL  Plans:  The patient will be discharged from the PACU to home when recovered from anesthesia and after chest x-ray is reviewed. We will review the cytology, pathology results with the patient when they become available. Outpatient followup will be with Kandice Robinsons, NP.   Josephine Igo, DO [facility] 01/09/2023 9:47 AM

## 2023-01-09 NOTE — Transfer of Care (Signed)
Immediate Anesthesia Transfer of Care Note  Patient: Erin Pacheco  Procedure(s) Performed: ROBOTIC ASSISTED NAVIGATIONAL BRONCHOSCOPY (Left) BRONCHIAL NEEDLE ASPIRATION BIOPSIES BRONCHIAL BIOPSIES  Patient Location: PACU  Anesthesia Type:General  Level of Consciousness: awake, alert , and oriented  Airway & Oxygen Therapy: Patient Spontanous Breathing and Patient connected to nasal cannula oxygen  Post-op Assessment: Report given to RN and Post -op Vital signs reviewed and stable  Post vital signs: Reviewed and stable  Last Vitals:  Vitals Value Taken Time  BP 117/56 01/09/23 0949  Temp    Pulse 68 01/09/23 0950  Resp 16 01/09/23 0950  SpO2 100 % 01/09/23 0950  Vitals shown include unfiled device data.  Last Pain:  Vitals:   01/09/23 0823  TempSrc:   PainSc: 0-No pain         Complications: No notable events documented.

## 2023-01-09 NOTE — Discharge Instructions (Signed)

## 2023-01-09 NOTE — Anesthesia Procedure Notes (Signed)
Procedure Name: Intubation Date/Time: 01/09/2023 9:20 AM  Performed by: Rachel Moulds, CRNAPre-anesthesia Checklist: Patient identified, Emergency Drugs available, Suction available, Patient being monitored and Timeout performed Patient Re-evaluated:Patient Re-evaluated prior to induction Oxygen Delivery Method: Circle system utilized Preoxygenation: Pre-oxygenation with 100% oxygen Induction Type: IV induction Ventilation: Mask ventilation without difficulty Laryngoscope Size: Mac and 3 Grade View: Grade III Tube type: Oral Tube size: 8.5 mm Number of attempts: 1 Airway Equipment and Method: Stylet Placement Confirmation: breath sounds checked- equal and bilateral, CO2 detector, positive ETCO2 and ETT inserted through vocal cords under direct vision Secured at: 21 cm Tube secured with: Tape Dental Injury: Teeth and Oropharynx as per pre-operative assessment

## 2023-01-11 ENCOUNTER — Telehealth: Payer: Self-pay | Admitting: Physician Assistant

## 2023-01-11 NOTE — Telephone Encounter (Signed)
Scheduled appointment per stf msg. Patient is aware of appointment date and time. Patient is aware to arrive 15 mins prior to appointment time and to bring updated insurance cards. Patient is aware of location.

## 2023-01-11 NOTE — Telephone Encounter (Signed)
I called [name] to review the LUL biopsy results that confirm adenocarcinoma. Final diagnosis based on completed workup is metastatic lung adenocarcinoma. Dr. Arbutus Ped, medical oncologist, will follow up to discuss treatment recommendations.  Erin Pacheco was appreciative of the call and will wait for appt with Dr. Arbutus Ped.

## 2023-01-11 NOTE — Progress Notes (Unsigned)
[facility] [phone]   Fax:([phone]) [phone]  CONSULT NOTE  REFERRING PHYSICIAN: Georga Kaufmann  REASON FOR CONSULTATION:  Adenocarcinoma of the lung   HPI Erin Pacheco is a 72 y.o. 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 She is accompanied by her sister for this visit.   The patient established with the diagnostic clinic on 12/06/22. She presented to the ER on 6/12 with abdominal pain, cough, and productive cough. Her CTA showed segmental pulmonary emboli in the RUL>LUL with a spiculated left upper lobe mass measuring 2.5 cm and a right adrenal mass measuring 5.2 cm.   She is on Eliquis for the PE.   She had a CT guided bipsy of the right adrenal mass which showed metastatic well differentiated adenocarcinoma. Initially IHC suggested upper GI pr pancreatobiliary origin.   She had hemorrhage associated within the mass.   Fatigue. Appetite is stable but lost weight. She does reports occasional episodes of low back pain. She reports shorntess of breath with exertion but none at rest. She denies fevers, chills, sweats, chest pain or cough.   She had EGD on 12/09/22 EGD and biopsies were negative for malignancy.   PET scan on 12/15/22 showed hypermetabolic spiculated left upper lobe lung nodule concerning for primary lung malignancy. The large right adrenal lesion likely metastatic disease. There are additional small solid pulmonary nodule of the LUL without significant FGD update which likely is due to the small size and metastatic disease is not excluded.   Dr. Tonia Brooms arrange for bronchoscopy and biopsy on 12/22/22.   The final pathology showed adenocarcinoma. Molecular studies show she is positive for KRASG12C which can be used in the second line setting.   The patient is here to establish with Dr. Arbutus Ped and  myself today.    HPI  Past Medical History:  Diagnosis Date   Arthritis    Cancer (HCC)    right adrenal adenocarcinoma 11/23/22   Cardiomyopathy (HCC)    COPD (chronic obstructive pulmonary disease) (HCC)    Hypertension    Pulmonary embolism (HCC) 11/16/2022   Tobacco abuse     Past Surgical History:  Procedure Laterality Date   ABDOMINAL HYSTERECTOMY     BILATERAL OOPHORECTOMY     BIOPSY  12/09/2022   Procedure: BIOPSY;  Surgeon: Imogene Burn, MD;  Location: [facility] ENDOSCOPY;  Service: Gastroenterology;;   CATARACT EXTRACTION W/PHACO  12/27/2010   Procedure: CATARACT EXTRACTION PHACO AND INTRAOCULAR LENS PLACEMENT (IOC);  Surgeon: Gemma Payor;  Location: [facility] ORS;  Service: Ophthalmology;  Laterality: Right;   CATARACT EXTRACTION W/PHACO  03/28/2011   Procedure: CATARACT EXTRACTION PHACO AND INTRAOCULAR LENS PLACEMENT (IOC);  Surgeon: Gemma Payor;  Location: [facility] ORS;  Service: Ophthalmology;  Laterality: Left;  CDE 7.27   ESOPHAGOGASTRODUODENOSCOPY (EGD) WITH PROPOFOL N/A 12/09/2022   Procedure: ESOPHAGOGASTRODUODENOSCOPY (EGD) WITH PROPOFOL;  Surgeon: Imogene Burn, MD;  Location: [facility] ENDOSCOPY;  Service: Gastroenterology;  Laterality: N/A;    Family History  Problem Relation Age of Onset   Ovarian cancer Mother 72   High blood pressure Mother    Throat cancer Brother        smoker   Liver disease Neg Hx    Esophageal cancer Neg Hx    Colon cancer Neg Hx     Social History Social History   Tobacco Use   Smoking status:  Former    Current packs/day: 0.00    Average packs/day: 0.5 packs/day for 30.0 years (15.0 ttl pk-yrs)    Types: Cigarettes    Start date: 11/1992    Quit date: 11/2022    Years since quitting: 0.1   Smokeless tobacco: Never  Vaping Use   Vaping status: Never Used  Substance Use Topics   Alcohol use: No   Drug use: No    No Known Allergies  Current Outpatient Medications  Medication Sig Dispense Refill   acetaminophen (TYLENOL) 325 MG tablet Take  2 tablets (650 mg total) by mouth every 6 (six) hours as needed for mild pain (or Fever >/= 101).     apixaban (ELIQUIS) 5 MG TABS tablet Take 1 tablet (5 mg total) by mouth 2 (two) times daily. 60 tablet 2   Camphor-Menthol-Methyl Sal (SALONPAS) 3.06-11-08 % PTCH Place 1 patch onto the skin daily as needed (pain). (Patient not taking: Reported on 12/21/2022)     dexamethasone (DECADRON) 4 MG tablet Take 4 mg by mouth daily as needed (For arthritis pain).     enoxaparin (LOVENOX) 60 MG/0.6ML injection Inject 0.6 mLs (60 mg total) into the skin every 12 (twelve) hours for 2 days. Hold Lovenox 12 hours before EGD. 2.4 mL 0   fluticasone furoate-vilanterol (BREO ELLIPTA) 100-25 MCG/ACT AEPB Inhale 1 puff into the lungs daily. 28 each 0   losartan (COZAAR) 50 MG tablet Take 1 tablet (50 mg total) by mouth daily. 90 tablet 3   metoprolol succinate (TOPROL-XL) 50 MG 24 hr tablet Take 1 tablet (50 mg total) by mouth daily. Take with or immediately following a meal. 30 tablet 1   Multiple Vitamin (MULTIVITAMIN) tablet Take 1 tablet by mouth daily.       oxyCODONE (OXY IR/ROXICODONE) 5 MG immediate release tablet Take 1 tablet (5 mg total) by mouth every 6 (six) hours as needed for moderate pain. 60 tablet 0   Polyethyl Glycol-Propyl Glycol (SYSTANE OP) Place 1 drop into both eyes daily as needed (dry eye).     spironolactone (ALDACTONE) 25 MG tablet Take 0.5 tablets (12.5 mg total) by mouth daily. 30 tablet 1   No current facility-administered medications for this visit.    REVIEW OF SYSTEMS:   Review of Systems  Constitutional: Negative for appetite change, chills, fatigue, fever and unexpected weight change.  HENT:   Negative for mouth sores, nosebleeds, sore throat and trouble swallowing.   Eyes: Negative for eye problems and icterus.  Respiratory: Negative for cough, hemoptysis, shortness of breath and wheezing.   Cardiovascular: Negative for chest pain and leg swelling.  Gastrointestinal: Negative  for abdominal pain, constipation, diarrhea, nausea and vomiting.  Genitourinary: Negative for bladder incontinence, difficulty urinating, dysuria, frequency and hematuria.   Musculoskeletal: Negative for back pain, gait problem, neck pain and neck stiffness.  Skin: Negative for itching and rash.  Neurological: Negative for dizziness, extremity weakness, gait problem, headaches, light-headedness and seizures.  Hematological: Negative for adenopathy. Does not bruise/bleed easily.  Psychiatric/Behavioral: Negative for confusion, depression and sleep disturbance. The patient is not nervous/anxious.     PHYSICAL EXAMINATION:  There were no vitals taken for this visit.  ECOG PERFORMANCE STATUS: {CHL ONC ECOG [id number]  Physical Exam  Constitutional: Oriented to person, place, and time and well-developed, well-nourished, and in no distress. No distress.  HENT:  Head: Normocephalic and atraumatic.  Mouth/Throat: Oropharynx is clear and moist. No oropharyngeal exudate.  Eyes: Conjunctivae are normal. Right eye exhibits no discharge. Left  eye exhibits no discharge. No scleral icterus.  Neck: Normal range of motion. Neck supple.  Cardiovascular: Normal rate, regular rhythm, normal heart sounds and intact distal pulses.   Pulmonary/Chest: Effort normal and breath sounds normal. No respiratory distress. No wheezes. No rales.  Abdominal: Soft. Bowel sounds are normal. Exhibits no distension and no mass. There is no tenderness.  Musculoskeletal: Normal range of motion. Exhibits no edema.  Lymphadenopathy:    No cervical adenopathy.  Neurological: Alert and oriented to person, place, and time. Exhibits normal muscle tone. Gait normal. Coordination normal.  Skin: Skin is warm and dry. No rash noted. Not diaphoretic. No erythema. No pallor.  Psychiatric: Mood, memory and judgment normal.  Vitals reviewed.  LABORATORY DATA: Lab Results  Component Value Date   WBC 9.2 12/21/2022   HGB 9.0 (L)  12/21/2022   HCT 26.3 (L) 12/21/2022   MCV 83 12/21/2022   PLT 512 (H) 12/21/2022      Chemistry      Component Value Date/Time   NA 138 01/09/2023 0838   K 3.8 01/09/2023 0838   CL 100 01/09/2023 0838   CO2 26 01/09/2023 0838   BUN 7 (L) 01/09/2023 0838   CREATININE 1.04 (H) 01/09/2023 0838   CREATININE 0.90 12/06/2022 1454      Component Value Date/Time   CALCIUM 8.9 01/09/2023 0838   ALKPHOS 69 12/06/2022 1454   AST 12 (L) 12/06/2022 1454   ALT 16 12/06/2022 1454   BILITOT 0.3 12/06/2022 1454       RADIOGRAPHIC STUDIES: DG Chest Port 1 View  Result Date: 01/09/2023 CLINICAL DATA:  Bronchoscopy EXAM: PORTABLE CHEST 1 VIEW COMPARISON:  11/16/2022 FINDINGS: The heart size and mediastinal contours are within normal limits. Aortic atherosclerosis. Left suprahilar lung nodule better seen on recent CT. No pleural effusion or pneumothorax. Linear atelectasis at the right lung base. The visualized skeletal structures are unremarkable. IMPRESSION: No pneumothorax following bronchoscopy. Electronically Signed   By: Duanne Guess D.O.   On: 01/09/2023 10:28   DG C-ARM BRONCHOSCOPY  Result Date: 01/09/2023 C-ARM BRONCHOSCOPY: Fluoroscopy was utilized by the requesting physician.  No radiographic interpretation.   NM PET Image Initial (PI) Skull Base To Thigh  Result Date: 12/15/2022 CLINICAL DATA:  Initial treatment strategy for adenocarcinoma of unknown primary. EXAM: NUCLEAR MEDICINE PET SKULL BASE TO THIGH TECHNIQUE: 7.6 mCi F-18 FDG was injected intravenously. Full-ring PET imaging was performed from the skull base to thigh after the radiotracer. CT data was obtained and used for attenuation correction and anatomic localization. Fasting blood glucose: 90 mg/dl COMPARISON:  Chest CTA dated November 16, 2022; CT abdomen pelvis dated November 17, 2022 FINDINGS: Mediastinal blood pool activity: SUV max 2.7 Liver activity: SUV max NA NECK: No hypermetabolic lymph nodes in the neck. Incidental CT  findings: None. CHEST: Hypermetabolic spiculated left upper lobe lung nodule measuring 2.4 x 2.1 cm on series 604, image 41 with SUV max of 17.6. Additional nodule of the left upper lobe measuring 7 mm on image 54, no significant FDG uptake, possibly due to small size. Subtle peripheral opacity of the left lower lobe with mild FDG uptake seen on series 4, image 58, likely resolving pulmonary infarct. Incidental CT findings: Moderate centrilobular emphysema. Mild cardiomegaly. Moderate coronary artery calcifications. Normal caliber thoracic aorta with moderate calcified plaque. ABDOMEN/PELVIS: Hypermetabolic large right adrenal gland mass, not significantly changed in size when compared with prior MRI, SUV max of 19.0. No other sites of hypermetabolic metastatic disease seen in the abdomen  or pelvis. Focal uptake in the left pelvis with no definite CT correlate, likely distal left ureter uterine uptake. Incidental CT findings: Normal caliber abdominal aorta with severe atherosclerotic disease. Small fat containing right inguinal hernia. SKELETON: No focal hypermetabolic activity to suggest skeletal metastasis. Incidental CT findings: None. IMPRESSION: 1. Hypermetabolic spiculated left upper lobe lung nodule, concerning for primary lung malignancy. 2. Hypermetabolic large right adrenal gland mass, likely metastatic disease. 3. Additional small solid pulmonary nodule of the left upper lobe with no significant FDG uptake, likely due to small size pulmonary metastatic disease can not be excluded. Recommend attention on follow-up. 4. Subtle peripheral opacity of the left lower lobe with mild FDG uptake, likely resolving pulmonary infarct. 5. Aortic Atherosclerosis (ICD10-I70.0) and Emphysema (ICD10-J43.9). Electronically Signed   By: Allegra Lai M.D.   On: 12/15/2022 14:33    ASSESSMENT: This is a very pleasant 72 year old female with stage IV (T***, N0, M1) Non-Small Cell Lung Cancer, adenocarcinoma. She  presented with a spiculated left upper lobe lung nodule and large right adrenal gland mass. She was diagnosed in July 2024. She had molecular studies that showed she has KRASG12C which can be used in the second line setting.   Dr. Arbutus Ped had a lengthly discussion with the patient today about her current condition and treatment options. Her options include referral to hospice/palliative vs. Treatment with systemic chemotherapy with carboplatin for an AUC of 5, Alimta 500 mg/m, and Keytruda 200 mg IV every 3 weeks.  The patient is interested in proceeding with systemic chemotherapy.  She is expected to start her first dose of this treatment on __.  We discussed the adverse side effects of treatment including but not limited to alopecia, myelosuppression, nausea and vomiting, peripheral neuropathy, liver or renal dysfunction as well as immunotherapy mediated adverse effects.   I will arrange for the patient to have a chemoeducation class prior to receiving her first cycle of chemotherapy.   We will arrange for the patient to have a B12 injection while in the clinic today.     I sent prescriptions for 1 mg folic acid p.o. daily as well as Compazine 10 mg every 6 hours as needed for nausea.   The patient will follow-up in 2 weeks for a one-week follow-up visit after completing her first cycle of chemotherapy.  The patient was advised to call immediately if she has any concerning symptoms in the interval. The patient voices understanding of current disease status and treatment options and is in agreement with the current care plan. All questions were answered. The patient knows to call the clinic with any problems, questions or concerns. We can certainly see the patient much sooner if necessary   The patient voices understanding of current disease status and treatment options and is in agreement with the current care plan.  All questions were answered. The patient knows to call the clinic with any  problems, questions or concerns. We can certainly see the patient much sooner if necessary.  Thank you so much for allowing me to participate in the care of NAOME MCBURNIE. I will continue to follow up the patient with you and assist in her care.  I spent {CHL ONC TIME VISIT - BJYNW:[id number]} counseling the patient face to face. The total time spent in the appointment was {CHL ONC TIME VISIT - VHQIO:[id number]}.  Disclaimer: This note was dictated with voice recognition software. Similar sounding words can inadvertently be transcribed and may not be corrected upon review.   L  January 11, 2023, 6:27 PM

## 2023-01-12 ENCOUNTER — Ambulatory Visit: Payer: Medicare HMO | Admitting: Physician Assistant

## 2023-01-12 ENCOUNTER — Emergency Department (HOSPITAL_COMMUNITY)
Admission: EM | Admit: 2023-01-12 | Discharge: 2023-01-13 | Disposition: A | Discharge status: Home / Self Care | Payer: Medicare HMO | Attending: Emergency Medicine | Admitting: Emergency Medicine

## 2023-01-12 ENCOUNTER — Emergency Department (HOSPITAL_COMMUNITY): Payer: Medicare HMO

## 2023-01-12 ENCOUNTER — Other Ambulatory Visit: Payer: Self-pay

## 2023-01-12 ENCOUNTER — Ambulatory Visit: Payer: Medicare HMO

## 2023-01-12 ENCOUNTER — Inpatient Hospital Stay: Payer: Medicare HMO | Attending: Physician Assistant

## 2023-01-12 ENCOUNTER — Encounter (HOSPITAL_COMMUNITY): Payer: Self-pay | Admitting: Emergency Medicine

## 2023-01-12 ENCOUNTER — Inpatient Hospital Stay: Payer: Medicare HMO

## 2023-01-12 VITALS — BP 127/58 | HR 73 | Temp 98.7°F | Resp 16 | Ht 62.0 in | Wt 127.9 lb

## 2023-01-12 DIAGNOSIS — I700 Atherosclerosis of aorta: Secondary | ICD-10-CM | POA: Insufficient documentation

## 2023-01-12 DIAGNOSIS — M545 Low back pain, unspecified: Secondary | ICD-10-CM | POA: Diagnosis not present

## 2023-01-12 DIAGNOSIS — R918 Other nonspecific abnormal finding of lung field: Secondary | ICD-10-CM

## 2023-01-12 DIAGNOSIS — Z85118 Personal history of other malignant neoplasm of bronchus and lung: Secondary | ICD-10-CM | POA: Diagnosis not present

## 2023-01-12 DIAGNOSIS — E279 Disorder of adrenal gland, unspecified: Secondary | ICD-10-CM | POA: Diagnosis not present

## 2023-01-12 DIAGNOSIS — R109 Unspecified abdominal pain: Secondary | ICD-10-CM | POA: Insufficient documentation

## 2023-01-12 DIAGNOSIS — D649 Anemia, unspecified: Secondary | ICD-10-CM | POA: Insufficient documentation

## 2023-01-12 DIAGNOSIS — Z86711 Personal history of pulmonary embolism: Secondary | ICD-10-CM | POA: Insufficient documentation

## 2023-01-12 DIAGNOSIS — E278 Other specified disorders of adrenal gland: Secondary | ICD-10-CM

## 2023-01-12 DIAGNOSIS — C7971 Secondary malignant neoplasm of right adrenal gland: Secondary | ICD-10-CM | POA: Insufficient documentation

## 2023-01-12 DIAGNOSIS — D500 Iron deficiency anemia secondary to blood loss (chronic): Secondary | ICD-10-CM

## 2023-01-12 DIAGNOSIS — C3412 Malignant neoplasm of upper lobe, left bronchus or lung: Secondary | ICD-10-CM

## 2023-01-12 DIAGNOSIS — Z7901 Long term (current) use of anticoagulants: Secondary | ICD-10-CM | POA: Diagnosis not present

## 2023-01-12 DIAGNOSIS — M549 Dorsalgia, unspecified: Secondary | ICD-10-CM | POA: Insufficient documentation

## 2023-01-12 DIAGNOSIS — M546 Pain in thoracic spine: Secondary | ICD-10-CM

## 2023-01-12 DIAGNOSIS — Z87891 Personal history of nicotine dependence: Secondary | ICD-10-CM | POA: Insufficient documentation

## 2023-01-12 DIAGNOSIS — C3490 Malignant neoplasm of unspecified part of unspecified bronchus or lung: Secondary | ICD-10-CM | POA: Diagnosis not present

## 2023-01-12 DIAGNOSIS — D509 Iron deficiency anemia, unspecified: Secondary | ICD-10-CM | POA: Insufficient documentation

## 2023-01-12 DIAGNOSIS — K4090 Unilateral inguinal hernia, without obstruction or gangrene, not specified as recurrent: Secondary | ICD-10-CM | POA: Insufficient documentation

## 2023-01-12 DIAGNOSIS — Z7189 Other specified counseling: Secondary | ICD-10-CM

## 2023-01-12 LAB — CBC WITH DIFFERENTIAL (CANCER CENTER ONLY)
Abs Immature Granulocytes: 0.05 K/uL (ref 0.00–0.07)
Basophils Absolute: 0.1 K/uL (ref 0.0–0.1)
Basophils Relative: 1 %
Eosinophils Absolute: 0.2 K/uL (ref 0.0–0.5)
Eosinophils Relative: 2 %
HCT: 19.6 % — ABNORMAL LOW (ref 36.0–46.0)
Hemoglobin: 6.2 g/dL — CL (ref 12.0–15.0)
Immature Granulocytes: 1 %
Lymphocytes Relative: 26 %
Lymphs Abs: 2.5 K/uL (ref 0.7–4.0)
MCH: 28.8 pg (ref 26.0–34.0)
MCHC: 31.6 g/dL (ref 30.0–36.0)
MCV: 91.2 fL (ref 80.0–100.0)
Monocytes Absolute: 1.1 K/uL — ABNORMAL HIGH (ref 0.1–1.0)
Monocytes Relative: 11 %
Neutro Abs: 6 K/uL (ref 1.7–7.7)
Neutrophils Relative %: 59 %
Platelet Count: 503 K/uL — ABNORMAL HIGH (ref 150–400)
RBC: 2.15 MIL/uL — ABNORMAL LOW (ref 3.87–5.11)
RDW: 18.6 % — ABNORMAL HIGH (ref 11.5–15.5)
WBC Count: 9.8 K/uL (ref 4.0–10.5)
nRBC: 0.7 % — ABNORMAL HIGH (ref 0.0–0.2)

## 2023-01-12 LAB — BPAM RBC
Blood Product Expiration Date: 202409032359
Blood Product Expiration Date: 202409032359
ISSUE DATE / TIME: 202408082049
ISSUE DATE / TIME: 202408082356
Unit Type and Rh: 6200
Unit Type and Rh: 6200

## 2023-01-12 LAB — TYPE AND SCREEN
ABO/RH(D): A POS
ABO/RH(D): A POS
Antibody Screen: NEGATIVE
Antibody Screen: NEGATIVE
Unit division: 0
Unit division: 0

## 2023-01-12 LAB — CMP (CANCER CENTER ONLY)
ALT: 13 U/L (ref 0–44)
AST: 12 U/L — ABNORMAL LOW (ref 15–41)
Albumin: 3.1 g/dL — ABNORMAL LOW (ref 3.5–5.0)
Alkaline Phosphatase: 64 U/L (ref 38–126)
Anion gap: 5 (ref 5–15)
BUN: 12 mg/dL (ref 8–23)
CO2: 29 mmol/L (ref 22–32)
Calcium: 8.8 mg/dL — ABNORMAL LOW (ref 8.9–10.3)
Chloride: 105 mmol/L (ref 98–111)
Creatinine: 0.95 mg/dL (ref 0.44–1.00)
GFR, Estimated: >60 mL/min
Glucose, Bld: 94 mg/dL (ref 70–99)
Potassium: 4.1 mmol/L (ref 3.5–5.1)
Sodium: 139 mmol/L (ref 135–145)
Total Bilirubin: 0.3 mg/dL (ref 0.3–1.2)
Total Protein: 7.4 g/dL (ref 6.5–8.1)

## 2023-01-12 LAB — PREPARE RBC (CROSSMATCH)

## 2023-01-12 LAB — ABO/RH: ABO/RH(D): A POS

## 2023-01-12 MED ORDER — LACTATED RINGERS IV SOLN: INTRAVENOUS | Status: DC

## 2023-01-12 MED ORDER — SODIUM CHLORIDE 0.9% IV SOLUTION: Freq: Once | INTRAVENOUS | Status: AC

## 2023-01-12 MED ORDER — IOHEXOL 300 MG/ML  SOLN
100.0000 mL | Freq: Once | INTRAMUSCULAR | Status: AC | PRN
  Administered 2023-01-12: 100 mL via INTRAVENOUS

## 2023-01-12 NOTE — Patient Instructions (Addendum)
Summary:  -There are two main categories of lung cancer, they are named based on the size of the cancer cell. One is called Non-Small cell lung cancer. The other type is Small Cell Lung Cancer -The sample (biopsy) that they took of your tumor was consistent with a subtype of Non-small cell lung cancer called Adenocarcinoma. This is the most common type of lung cancer.  -We covered a lot of important information at your appointment today regarding what the treatment plan is moving forward. Here are the main points that were discussed at your office visit with [place] today:  -Technically, this is stage IV just because it started in the lung and this is biopsy proven to have spread to the adrenal gland. Fortunately, you do not have a high burden of disease. There is another small other lung nodule that may be cancer but is too small to tell on the PET scan.  -Standard treatment is what is below. However, given that you have only 2 main isolated spots of disease, Dr. Arbutus Ped is wanting to refer you to radiation oncology see if they can administer radiation directed to these 2 areas. -Radiation is only localized treatment.  Because this is stage IV and there could be microscopic disease in the blood, therefore, you do need chemotherapy and immunotherapy which goes in the blood to the entire body to prevent any other spots from popping up and to obtain disease control.  Stage IV lung cancer is not curable but is treatable.  The goal of treatment would be to prolong your life and to control the disease from the cancer popping up in other places and causing symptoms. -Surgery is not an option and stage IV lung cancer -I will place referral to radiation oncology.  They are in the basement of this building.  They will call you to set up a consultation. -The meantime, we will arrange for IV iron infusions.  These are given here at the cancer center.  Insurance decides which type of IV iron is preferred.  More often than  not, a type of IV iron called Venofer is preferred.  This is given once a week for 3 weeks. I do premedicate everyone just to prevent having an allergy to the iron, therefore, sometimes this may make you tired. Please have a driver take you to your iron appointments. While we are going to try and boost your iron up faster with iron infusions, I do want you to continue taking iron supplement every day with vitamin C to maintain your iron levels.  When we are going to initiate chemotherapy and immunotherapy, I want to have your baseline blood levels as high as possible prior to starting treatment. -If you need any assistance with applying for FMLA, please call either Jeri at [phone] or Roz at [phone].  You may also have your job fax paperwork to [phone].  Or you may email [email or web address].  We ask that you allow 10-14 business days for processing.  Therefore, the earlier you can submit paperwork the better. -We will see you back for follow-up visit in 3 weeks.  Hopefully, at this time you will have completed most of your radiation treatment and received her iron infusions.  We will talk to you more detail about chemotherapy/immunotherapy at that time.  Just so you are familiar, standard treatment is listed below.   Just know, we will review this information below again in more detail at your next appointment and make arrangements. Just so you are familiar:  -  The treatment consists of two chemotherapy drugs, called Carboplatin and Alimta (also called Pemetrexed) and one immunotherapy drug called Keytruda (pembrolizumab).  -We are planning on starting your treatment about 1 week after we see you next time. Before you start any treatment, we would arrange for a Chemotherapy Education Class. This involves having you sit down with one of our nurse educators. She will discuss with your one-on-one more details about your treatment as well as general information about resources here at the cancer  center.  -Your treatment will be given once every 3 weeks. We will check your labs once a week for the first ~5 treatments just to make sure that important components of your blood are in an acceptable range -We will get a CT scan after 3 treatments to check on the progress of treatment  Medications:  These are medications that we will send to your pharmacy or administer at your next appointment.  -Compazine. This medication is for nausea. You may take this every 6 hours as needed if you feel nauseous.  (Patient also received antinausea medicines on days that they receive chemotherapy in the office.) -1 mg of folic acid to your pharmacy. We need you to take 1 tablet every day as soon as you receive this prescription -We will administer vitamin B12 every 9 weeks while you are here in the clinic. You have received your first dose at your next appointment  Follow up:  -We will see you back for a follow up visit 1 week after your first treatment to see how it went and help manage any side effects of treatment that you may have   -If you need to reach [place] at any time, the main office number to the cancer center is [phone], when you call, ask to speak to either Cassie's or Dr. Asa Lente nurse.

## 2023-01-12 NOTE — ED Provider Notes (Signed)
Care assumed at 2300.  Patient referred from the cancer center for anemia.  She is on anticoagulation but does not have any reported active bleeding.  Care assumed pending transfusion of 2 units PRBC.  Following transfusion patient without complaints.  Feels improved and wishes to go home.  Plan to discharge with outpatient oncology follow-up.   Tilden Fossa, MD 01/13/23 [phone]

## 2023-01-12 NOTE — Discharge Instructions (Signed)
Follow-up in the cancer center 

## 2023-01-12 NOTE — ED Provider Notes (Addendum)
EMERGENCY DEPARTMENT AT [facility] Provider Note   CSN: [id number] Arrival date & time: 01/12/23  8469     History  Chief Complaint  Patient presents with   Low Hgb    Erin Pacheco is a 72 y.o. female.  72 year old female presents due to low hemoglobin.  Patient newly diagnosed with having lung cancer.  Had biopsy done 3 days ago.  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  At her visit today at the cancer center, patient noted hemoglobin of 6.2.  Baseline runs around 9.  Patient also has had recurrent lower back pain.  Based on review of her outside chart, concern was for possibility retroperitoneal bleed.  Patient denies any upper or lower GI bleeding symptoms.  Sent here for further evaluation as well as blood transfusion.  Patient himself denies any shortness of breath but does endorse some weakness.       Home Medications Prior to Admission medications   Medication Sig Start Date End Date Taking? Authorizing Provider  acetaminophen (TYLENOL) 325 MG tablet Take 2 tablets (650 mg total) by mouth every 6 (six) hours as needed for mild pain (or Fever >/= 101). 11/26/22   [name], MD  apixaban (ELIQUIS) 5 MG TABS tablet Take 1 tablet (5 mg total) by mouth 2 (two) times daily. 11/26/22   [name], MD  Camphor-Menthol-Methyl Sal (SALONPAS) 3.06-11-08 % PTCH Place 1 patch onto the skin daily as needed (pain). Patient not taking: Reported on 12/21/2022    [provider]  dexamethasone (DECADRON) 4 MG tablet Take 4 mg by mouth daily as needed (For arthritis pain).    [provider]  enoxaparin (LOVENOX) 60 MG/0.6ML injection Inject 0.6 mLs (60 mg total) into the skin every 12 (twelve) hours for 2 days. Hold Lovenox 12 hours before EGD. 12/07/22 12/09/22  Georga Kaufmann T, [name]  fluticasone furoate-vilanterol (BREO ELLIPTA) 100-25 MCG/ACT AEPB Inhale 1 puff into the lungs daily. 11/27/22   [name], MD  losartan (COZAAR) 50 MG tablet Take 1  tablet (50 mg total) by mouth daily. 12/21/22 03/21/23  Azalee Course, PA  metoprolol succinate (TOPROL-XL) 50 MG 24 hr tablet Take 1 tablet (50 mg total) by mouth daily. Take with or immediately following a meal. 12/21/22   Azalee Course, PA  Multiple Vitamin (MULTIVITAMIN) tablet Take 1 tablet by mouth daily.      [provider]  oxyCODONE (OXY IR/ROXICODONE) 5 MG immediate release tablet Take 1 tablet (5 mg total) by mouth every 6 (six) hours as needed for moderate pain. 12/22/22   Jaci Standard, MD  Polyethyl Glycol-Propyl Glycol (SYSTANE OP) Place 1 drop into both eyes daily as needed (dry eye).    [provider]  spironolactone (ALDACTONE) 25 MG tablet Take 0.5 tablets (12.5 mg total) by mouth daily. 12/21/22   Azalee Course, PA      Allergies    Patient has no known allergies.    Review of Systems   Review of Systems  All other systems reviewed and are negative.   Physical Exam Updated Vital Signs There were no vitals taken for this visit. Physical Exam Vitals and nursing note reviewed.  Constitutional:      General: She is not in acute distress.    Appearance: Normal appearance. She is well-developed. She is not toxic-appearing.  HENT:     Head: Normocephalic and atraumatic.  Eyes:     General: Lids are  normal.     Conjunctiva/sclera: Conjunctivae normal.     Pupils: Pupils are equal, round, and reactive to light.  Neck:     Thyroid: No thyroid mass.     Trachea: No tracheal deviation.  Cardiovascular:     Rate and Rhythm: Normal rate and regular rhythm.     Heart sounds: Normal heart sounds. No murmur heard.    No gallop.  Pulmonary:     Effort: Pulmonary effort is normal. No respiratory distress.     Breath sounds: Normal breath sounds. No stridor. No decreased breath sounds, wheezing, rhonchi or rales.  Abdominal:     General: There is no distension.     Palpations: Abdomen is soft.     Tenderness: There is no abdominal tenderness. There is no rebound.   Musculoskeletal:        General: No tenderness. Normal range of motion.     Cervical back: Normal range of motion and neck supple.  Skin:    General: Skin is warm and dry.     Findings: No abrasion or rash.  Neurological:     Mental Status: She is alert and oriented to person, place, and time. Mental status is at baseline.     GCS: GCS eye subscore is 4. GCS verbal subscore is 5. GCS motor subscore is 6.     Cranial Nerves: Cranial nerves are intact. No cranial nerve deficit.     Sensory: No sensory deficit.     Motor: Motor function is intact.  Psychiatric:        Attention and Perception: Attention normal.        Speech: Speech normal.        Behavior: Behavior normal.     ED Results / Procedures / Treatments   Labs (all labs ordered are listed, but only abnormal results are displayed) Labs Reviewed  TYPE AND SCREEN  PREPARE RBC (CROSSMATCH)    EKG None  Radiology No results found.  Procedures Procedures    Medications Ordered in ED Medications  lactated ringers infusion (has no administration in time range)  0.9 %  sodium chloride infusion (Manually program via Guardrails IV Fluids) (has no administration in time range)    ED Course/ Medical Decision Making/ A&P                                 Medical Decision Making Amount and/or Complexity of Data Reviewed Labs: ordered. Radiology: ordered.  Risk Prescription drug management.   Patient seen globin noted to be 6.2.  No concern for upper GI or lower GI bleed.  Concern for possible retroperitoneal hemorrhage and CTA scan was performed from interpretation showed no acute findings.  Patient has history of blood loss in the past.  Will receive 2 units of packed red blood cells here.  Will then follow-up in the cancer center.  Care to be turned over to Dr. Madilyn Hook        Final Clinical Impression(s) / ED Diagnoses Final diagnoses:  None    Rx / DC Orders ED Discharge Orders     None          Lorre Nick, MD 01/12/23 [id number]    Lorre Nick, MD 01/12/23 2313

## 2023-01-12 NOTE — ED Triage Notes (Signed)
Pt from Cancer Center with low Hbg 6.2. Pt started to have acute right flank pain. Pt had adrenal biopsy 4-6 weeks ago and had bleeding shortly after. New lung cancer pt. Recent PE, now on blood thinner.   BP 127/58 T 98.7 HR 73

## 2023-01-13 ENCOUNTER — Telehealth: Payer: Self-pay | Admitting: Internal Medicine

## 2023-01-13 MED ORDER — ACETAMINOPHEN 325 MG PO TABS
650.0000 mg | ORAL_TABLET | Freq: Once | ORAL | Status: AC
  Administered 2023-01-13: 650 mg via ORAL
  Filled 2023-01-13: qty 2

## 2023-01-13 NOTE — ED Notes (Signed)
Pt ambulated to the bathroom via wheelchair. Was able to stand and pivot in bathroom without assistance.

## 2023-01-13 NOTE — Progress Notes (Signed)
This nurse received FMLA forms from [employer] for this patients daughter, Esther Russon.  Received via fax.   Submitted for completion.  Sent My Chart message advising that documents have been received and they do have a 7-10 day turnaround time.  No further concerns noted at this time.

## 2023-01-13 NOTE — Telephone Encounter (Signed)
Scheduled per 08/08 los, patient has been called and notified. 

## 2023-01-16 NOTE — Progress Notes (Signed)
Thoracic Location of Tumor / Histology: LUL Lung  Patient presented to the ER 6/12 with complaints of abdominal pain, cough, and productive cough.  CT AP 01/12/2023: No acute abnormality or explanation for abdominal pain.  Slight interval increase in size of heterogeneous right adrenal mass, currently 6.5 x 4 cm, previously 5.2 x 3.1 cm.  Bronchoscopy 12/22/2022:  PET 12/15/2022: Hypermetabolic spiculated left upper lobe lung nodule concerning for primary lung malignancy.  The large right adrenal lesion likely metastatic disease.  There are additional small solid pulmonary nodule of the LUL without significant FGD uptake which likely is due to the small size and metastatic disease is not excluded.  CT AP 11/17/2022: 5.2 cm right adrenal mass is suspicious for malignancy.   CTA 11/16/2022: Segmental pulmonary emboli right upper lobe greater than left lower lobe.  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 Right adrenal mass identified at the edge of the imaging field. Malignant or metastatic lesion is possible.    Biopsies of LUL Lung Nodule 01/09/2023   Biopsies of Adrenal Mass 11/23/2022      Past/Anticipated interventions by cardiothoracic surgery, if any:   Past/Anticipated interventions by medical oncology, if any:  Cassie Heilingoetter PA / Dr. 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 Fortunately she does not have a high burden of disease.  -We will request her PD-L1 expression. -Dr. Arbutus Ped recommends referral to radiation oncology to see if she can receive radiation to the lung lesion and the adrenal gland lesion. I have placed the referral.  -Will then see her back for follow-up visit in 3 weeks to discuss systemic treatment in more detail. Dr. Arbutus Ped explained that she will need systemic therapy with chemotherapy and immunotherapy which will include carboplatin, Alimta, and Keytruda.     Tobacco/Marijuana/Snuff/ETOH use:  Signs/Symptoms Weight changes, if any:  Respiratory complaints, if any:  Hemoptysis, if any:  Pain issues, if any:    SAFETY ISSUES: Prior radiation?  Pacemaker/ICD?   Possible current pregnancy? Postmenopausal Is the patient on methotrexate?   Current Complaints / other details:

## 2023-01-17 ENCOUNTER — Inpatient Hospital Stay: Payer: Medicare HMO

## 2023-01-17 ENCOUNTER — Encounter: Payer: Self-pay | Admitting: Acute Care

## 2023-01-17 ENCOUNTER — Ambulatory Visit (INDEPENDENT_AMBULATORY_CARE_PROVIDER_SITE_OTHER): Payer: Medicare HMO

## 2023-01-17 ENCOUNTER — Ambulatory Visit: Payer: Medicare HMO | Admitting: Acute Care

## 2023-01-17 ENCOUNTER — Encounter: Payer: Self-pay | Admitting: Radiation Oncology

## 2023-01-17 ENCOUNTER — Ambulatory Visit: Admission: RE | Admit: 2023-01-17 | Payer: Medicare HMO | Source: Ambulatory Visit | Admitting: Radiation Oncology

## 2023-01-17 ENCOUNTER — Ambulatory Visit
Admission: RE | Admit: 2023-01-17 | Discharge: 2023-01-17 | Disposition: A | Discharge status: Home / Self Care | Payer: Medicare HMO | Source: Ambulatory Visit | Attending: Radiation Oncology | Admitting: Radiation Oncology

## 2023-01-17 ENCOUNTER — Ambulatory Visit: Payer: Self-pay

## 2023-01-17 ENCOUNTER — Other Ambulatory Visit: Payer: Self-pay

## 2023-01-17 VITALS — BP 113/65 | HR 68 | Temp 97.5°F | Resp 20 | Ht 62.0 in | Wt 127.2 lb

## 2023-01-17 VITALS — BP 120/60 | HR 79 | Ht 62.0 in | Wt 127.2 lb

## 2023-01-17 DIAGNOSIS — Z7901 Long term (current) use of anticoagulants: Secondary | ICD-10-CM | POA: Insufficient documentation

## 2023-01-17 DIAGNOSIS — I429 Cardiomyopathy, unspecified: Secondary | ICD-10-CM | POA: Diagnosis not present

## 2023-01-17 DIAGNOSIS — Z9889 Other specified postprocedural states: Secondary | ICD-10-CM

## 2023-01-17 DIAGNOSIS — Z8041 Family history of malignant neoplasm of ovary: Secondary | ICD-10-CM | POA: Insufficient documentation

## 2023-01-17 DIAGNOSIS — Z7951 Long term (current) use of inhaled steroids: Secondary | ICD-10-CM | POA: Insufficient documentation

## 2023-01-17 DIAGNOSIS — J449 Chronic obstructive pulmonary disease, unspecified: Secondary | ICD-10-CM | POA: Insufficient documentation

## 2023-01-17 DIAGNOSIS — C3412 Malignant neoplasm of upper lobe, left bronchus or lung: Secondary | ICD-10-CM | POA: Diagnosis not present

## 2023-01-17 DIAGNOSIS — I517 Cardiomegaly: Secondary | ICD-10-CM | POA: Diagnosis not present

## 2023-01-17 DIAGNOSIS — J984 Other disorders of lung: Secondary | ICD-10-CM | POA: Diagnosis not present

## 2023-01-17 DIAGNOSIS — Z87891 Personal history of nicotine dependence: Secondary | ICD-10-CM | POA: Diagnosis not present

## 2023-01-17 DIAGNOSIS — K4020 Bilateral inguinal hernia, without obstruction or gangrene, not specified as recurrent: Secondary | ICD-10-CM | POA: Diagnosis not present

## 2023-01-17 DIAGNOSIS — K4090 Unilateral inguinal hernia, without obstruction or gangrene, not specified as recurrent: Secondary | ICD-10-CM | POA: Diagnosis not present

## 2023-01-17 DIAGNOSIS — Z79899 Other long term (current) drug therapy: Secondary | ICD-10-CM | POA: Insufficient documentation

## 2023-01-17 DIAGNOSIS — D649 Anemia, unspecified: Secondary | ICD-10-CM | POA: Diagnosis not present

## 2023-01-17 DIAGNOSIS — F17210 Nicotine dependence, cigarettes, uncomplicated: Secondary | ICD-10-CM | POA: Diagnosis not present

## 2023-01-17 DIAGNOSIS — M129 Arthropathy, unspecified: Secondary | ICD-10-CM | POA: Insufficient documentation

## 2023-01-17 DIAGNOSIS — C7971 Secondary malignant neoplasm of right adrenal gland: Secondary | ICD-10-CM

## 2023-01-17 DIAGNOSIS — E278 Other specified disorders of adrenal gland: Secondary | ICD-10-CM

## 2023-01-17 DIAGNOSIS — D509 Iron deficiency anemia, unspecified: Secondary | ICD-10-CM | POA: Diagnosis not present

## 2023-01-17 DIAGNOSIS — I10 Essential (primary) hypertension: Secondary | ICD-10-CM | POA: Insufficient documentation

## 2023-01-17 DIAGNOSIS — R918 Other nonspecific abnormal finding of lung field: Secondary | ICD-10-CM

## 2023-01-17 DIAGNOSIS — Z86711 Personal history of pulmonary embolism: Secondary | ICD-10-CM | POA: Insufficient documentation

## 2023-01-17 DIAGNOSIS — I700 Atherosclerosis of aorta: Secondary | ICD-10-CM | POA: Insufficient documentation

## 2023-01-17 DIAGNOSIS — C7970 Secondary malignant neoplasm of unspecified adrenal gland: Secondary | ICD-10-CM | POA: Insufficient documentation

## 2023-01-17 DIAGNOSIS — Z800 Family history of malignant neoplasm of digestive organs: Secondary | ICD-10-CM | POA: Insufficient documentation

## 2023-01-17 DIAGNOSIS — R911 Solitary pulmonary nodule: Secondary | ICD-10-CM | POA: Diagnosis not present

## 2023-01-17 LAB — CMP (CANCER CENTER ONLY)
ALT: 14 U/L (ref 0–44)
AST: 15 U/L (ref 15–41)
Albumin: 3.1 g/dL — ABNORMAL LOW (ref 3.5–5.0)
Alkaline Phosphatase: 71 U/L (ref 38–126)
Anion gap: 5 (ref 5–15)
BUN: 13 mg/dL (ref 8–23)
CO2: 28 mmol/L (ref 22–32)
Calcium: 9 mg/dL (ref 8.9–10.3)
Chloride: 105 mmol/L (ref 98–111)
Creatinine: 0.98 mg/dL (ref 0.44–1.00)
GFR, Estimated: >60 mL/min (ref >60)
Glucose, Bld: 102 mg/dL — ABNORMAL HIGH (ref 70–99)
Potassium: 3.9 mmol/L (ref 3.5–5.1)
Sodium: 138 mmol/L (ref 135–145)
Total Bilirubin: 0.4 mg/dL (ref 0.3–1.2)
Total Protein: 7.4 g/dL (ref 6.5–8.1)

## 2023-01-17 LAB — CBC WITH DIFFERENTIAL (CANCER CENTER ONLY)
Abs Immature Granulocytes: 0.03 10*3/uL (ref 0.00–0.07)
Basophils Absolute: 0.1 10*3/uL (ref 0.0–0.1)
Basophils Relative: 1 %
Eosinophils Absolute: 0.1 10*3/uL (ref 0.0–0.5)
Eosinophils Relative: 2 %
HCT: 28.3 % — ABNORMAL LOW (ref 36.0–46.0)
Hemoglobin: 9.1 g/dL — ABNORMAL LOW (ref 12.0–15.0)
Immature Granulocytes: 0 %
Lymphocytes Relative: 22 %
Lymphs Abs: 2 10*3/uL (ref 0.7–4.0)
MCH: 28.5 pg (ref 26.0–34.0)
MCHC: 32.2 g/dL (ref 30.0–36.0)
MCV: 88.7 fL (ref 80.0–100.0)
Monocytes Absolute: 1 10*3/uL (ref 0.1–1.0)
Monocytes Relative: 11 %
Neutro Abs: 5.7 10*3/uL (ref 1.7–7.7)
Neutrophils Relative %: 64 %
Platelet Count: 392 10*3/uL (ref 150–400)
RBC: 3.19 MIL/uL — ABNORMAL LOW (ref 3.87–5.11)
RDW: 16.6 % — ABNORMAL HIGH (ref 11.5–15.5)
WBC Count: 8.9 10*3/uL (ref 4.0–10.5)
nRBC: 0 % (ref 0.0–0.2)

## 2023-01-17 LAB — SAMPLE TO BLOOD BANK

## 2023-01-17 MED ORDER — TRELEGY ELLIPTA 100-62.5-25 MCG/ACT IN AEPB: 1.0000 | INHALATION_SPRAY | Freq: Every day | RESPIRATORY_TRACT | Status: AC

## 2023-01-17 NOTE — Progress Notes (Signed)
Radiation Oncology         [phone] ________________________________  Name: Erin Pacheco        MRN: [id number]  Date of Service: 01/17/2023 DOB: 10/06/1950  JX:[id number], Erin Saran, MD  Erin Gaul, MD     REFERRING PHYSICIAN: Si Gaul, MD   DIAGNOSIS: The primary encounter diagnosis was Malignant neoplasm of upper lobe of left lung (HCC). 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   HISTORY OF PRESENT ILLNESS: Erin Pacheco is a 72 y.o. female seen at the request of Dr. 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  The patient presented with shortness of breath on 11/16/2022 at [facility]. Chest x-ray showed no focal airspace opacity but a CT angiography of the chest identified multiple sites of pulmonary emboli the most striking in the segmental branches of the right upper lobe and subtle area in the anterior left lower lobe there was no evidence of heart strain.  There was a spiculated left upper lobe mass near the margin of the hilum and mediastinum measuring 2.5 cm in greatest dimension with linear opacity in the lung bases felt to be scar or atelectasis and subtle opacity in the left lower lobe.  Trace left pleural fluid was seen and a incompletely visualized mass in the right adrenal gland was noted.  CT abdomen pelvis with and without contrast on 11/17/22 showed a 5.2 cm right adrenal mass with 33 Hounsfield units and absolute washout of -58% with relative washout of -25% both felt to be indeterminate and morphologically the lesion was suspicious and concerning for malignancy, a 1.2 cm cyst in the right kidney in the upper pole was noted, evidence of atherosclerotic disease and degenerative lumbosacral spine changes were noted.  Mild cardiomegaly, small bilateral groin hernias containing adipose tissue were noted as well and an MRI of the brain  on 11/18/2022 showed no evidence of intracranial disease she underwent a bone scan as well on 11/18/2022 that showed multifocal degenerative uptake without evidence of metastatic findings.  She underwent a CT-guided biopsy of the adrenal gland on 11/23/2022 which showed metastatic well-differentiated adenocarcinoma this was a nonspecific immunophenotype however it was possible that this could be an upper GI or pancreaticobiliary origin.  She underwent EGD on 12/09/2022 and biopsies of the duodenum were benign, peptic injury was suspected on a polypectomy from the ampulla, mild reactive gastropathy of the stomach biopsy and benign GE junction mucosa were noted in the specimens.  She then underwent a bronchoscopy and fine-needle aspirate of the left upper lobe on 01/09/2023 confirmed adenocarcinoma consistent with lung primary.  She underwent a PET scan as well on 12/15/2022 that showed hypermetabolic activity in the known left upper lobe nodule with an SUV of 17.6, and additional left upper lobe nodule measuring 7 mm had no FDG uptake and subtle peripheral opacity in the left lower lobe with mild FDG uptake was felt to be resolving pulmonary infarct, hypermetabolism is seen in the right adrenal gland mass with an SUV of 19 and no evidence of other sites of metastatic disease were appreciated.  She did have a repeat evaluation in the emergency room and CT abdomen pelvis with contrast on 01/12/2023 showed the right adrenal mass measuring 6.5 cm.  She met with Dr. Arbutus Pacheco to discuss systemic chemotherapy and is also seen to discuss stereotactic body radiation to the left upper lobe  and right adrenal gland metastasis.    PREVIOUS RADIATION THERAPY: No   PAST MEDICAL HISTORY:  Past Medical History:  Diagnosis Date   Arthritis    Cancer (HCC)    right adrenal adenocarcinoma 11/23/22   Cardiomyopathy (HCC)    COPD (chronic obstructive pulmonary disease) (HCC)    Hypertension    Pulmonary embolism (HCC) 11/16/2022    Tobacco abuse        PAST SURGICAL HISTORY: Past Surgical History:  Procedure Laterality Date   ABDOMINAL HYSTERECTOMY     BILATERAL OOPHORECTOMY     BIOPSY  12/09/2022   Procedure: BIOPSY;  Surgeon: Erin Burn, MD;  Location: [facility];  Service: Gastroenterology;;   BRONCHIAL BIOPSY  01/09/2023   Procedure: BRONCHIAL BIOPSIES;  Surgeon: Erin Igo, DO;  Location: [facility];  Service: Pulmonary;;   BRONCHIAL NEEDLE ASPIRATION BIOPSY  01/09/2023   Procedure: BRONCHIAL NEEDLE ASPIRATION BIOPSIES;  Surgeon: Erin Igo, DO;  Location: [facility];  Service: Pulmonary;;   CATARACT EXTRACTION W/PHACO  12/27/2010   Procedure: CATARACT EXTRACTION PHACO AND INTRAOCULAR LENS PLACEMENT (IOC);  Surgeon: Erin P[facility]co;  Location: [facility];  Service: Ophthalmology;  Laterality: Right;   CATARACT EXTRACTION W/PHACO  03/28/2011   Procedure: CATARACT EXTRACTION PHACO AND INTRAOCULAR LENS PLACEMENT (IOC);  Surgeon: Erin Pacheco;  Location: [facility];  Service: Ophthalmology;  Laterality: Left;  CDE 7.27   ESOPHAGOGASTRODUODENOSCOPY (EGD) WITH PROPOFOL N/A 12/09/2022   Procedure: ESOPHAGOGASTRODUODENOSCOPY (EGD) WITH PROPOFOL;  Surgeon: Erin Burn, MD;  Location: [facility];  Service: Gastroenterology;  Laterality: N/A;     FAMILY HISTORY:  Family History  Problem Relation Age of Onset   Ovarian cancer Mother 15   High blood pressure Mother    Throat cancer Brother        smoker   Liver disease Neg Hx    Esophageal cancer Neg Hx    Colon cancer Neg Hx      SOCIAL HISTORY:  reports that she quit smoking about 2 months ago. Her smoking use included cigarettes. She started smoking about 30 years ago. She has a 15 pack-year smoking history. She has never used smokeless tobacco. She reports that she does not drink alcohol and does not use drugs.  The patient is single and lives in [place].  She is retired and accompanied by her daughter.    ALLERGIES: Patient has no known  allergies.   MEDICATIONS:  Current Outpatient Medications  Medication Sig Dispense Refill   acetaminophen (TYLENOL) 325 MG tablet Take 2 tablets (650 mg total) by mouth every 6 (six) hours as needed for mild pain (or Fever >/= 101).     apixaban (ELIQUIS) 5 MG TABS tablet Take 1 tablet (5 mg total) by mouth 2 (two) times daily. 60 tablet 2   Camphor-Menthol-Methyl Sal (SALONPAS) 3.06-11-08 % PTCH Place 1 patch onto the skin daily as needed (pain).     dexamethasone (DECADRON) 4 MG tablet Take 4 mg by mouth daily as needed (For arthritis pain).     fluticasone furoate-vilanterol (BREO ELLIPTA) 100-25 MCG/ACT AEPB Inhale 1 puff into the lungs daily. 28 each 0   Fluticasone-Umeclidin-Vilant (TRELEGY ELLIPTA) 100-62.5-25 MCG/ACT AEPB Inhale 1 each into the lungs daily.     losartan (COZAAR) 50 MG tablet Take 1 tablet (50 mg total) by mouth daily. 90 tablet 3   metoprolol succinate (TOPROL-XL) 50 MG 24 hr tablet Take 1 tablet (50 mg total) by mouth daily. Take with or immediately following a meal. 30  tablet 1   Multiple Vitamin (MULTIVITAMIN) tablet Take 1 tablet by mouth daily.       oxyCODONE (OXY IR/ROXICODONE) 5 MG immediate release tablet Take 1 tablet (5 mg total) by mouth every 6 (six) hours as needed for moderate pain. 60 tablet 0   Polyethyl Glycol-Propyl Glycol (SYSTANE OP) Place 1 drop into both eyes daily as needed (dry eye).     spironolactone (ALDACTONE) 25 MG tablet Take 0.5 tablets (12.5 mg total) by mouth daily. 30 tablet 1   enoxaparin (LOVENOX) 60 MG/0.6ML injection Inject 0.6 mLs (60 mg total) into the skin every 12 (twelve) hours for 2 days. Hold Lovenox 12 hours before EGD. 2.4 mL 0   No current facility-administered medications for this encounter.     REVIEW OF SYSTEMS: On review of systems, the patient reports that she is having some pain in her right flank but was glad to hear her low blood counts didn't appear to be the result of a complication of her biopsy. She is ready  to proceed with treatment and has lost about 7-10 pounds unintentionally in the last month. She is not short of breath at this time and reports she feels like this has significantly improved since being on blood thinner. She has an occasional productive cough with clear to yellow mucous as well as a daily non productive cough. She is not having nausea or vomiting. No other complaints are verbalized.       PHYSICAL EXAM:  Wt Readings from Last 3 Encounters:  01/17/23 127 lb 3.2 oz (57.7 kg)  01/17/23 127 lb 3.2 oz (57.7 kg)  01/12/23 127 lb 13.9 oz (58 kg)   Temp Readings from Last 3 Encounters:  01/17/23 (!) 97.5 F (36.4 C)  01/13/23 99.7 F (37.6 C) (Oral)  01/12/23 98.7 F (37.1 C) (Oral)   BP Readings from Last 3 Encounters:  01/17/23 113/65  01/17/23 120/60  01/13/23 (!) 141/70   Pulse Readings from Last 3 Encounters:  01/17/23 68  01/17/23 79  01/13/23 80   Pain Assessment Pain Score: 6  Pain Loc: Back (Right side lower back/ flank area.)/10  In general this is a well appearing African American female in no acute distress. She's alert and oriented x4 and appropriate throughout the examination. Cardiopulmonary assessment is negative for acute distress and she exhibits normal effort.     ECOG = 1  0 - Asymptomatic (Fully active, able to carry on all predisease activities without restriction)  1 - Symptomatic but completely ambulatory (Restricted in physically strenuous activity but ambulatory and able to carry out work of a light or sedentary nature. For example, light housework, office work)  2 - Symptomatic, <50% in bed during the day (Ambulatory and capable of all self care but unable to carry out any work activities. Up and about more than 50% of waking hours)  3 - Symptomatic, >50% in bed, but not bedbound (Capable of only limited self-care, confined to bed or chair 50% or more of waking hours)  4 - Bedbound (Completely disabled. Cannot carry on any self-care.  Totally confined to bed or chair)  5 - Death   Santiago Glad MM, Creech RH, Tormey DC, et al. [phone]). "Toxicity and response criteria of the [facility]". Am. Evlyn Clines. Oncol. 5 (6): 649-55    LABORATORY DATA:  Lab Results  Component Value Date   WBC 8.9 01/17/2023   HGB 9.1 (L) 01/17/2023   HCT 28.3 (L) 01/17/2023   MCV 88.7  01/17/2023   PLT 392 01/17/2023   Lab Results  Component Value Date   NA 138 01/17/2023   K 3.9 01/17/2023   CL 105 01/17/2023   CO2 28 01/17/2023   Lab Results  Component Value Date   ALT 14 01/17/2023   AST 15 01/17/2023   ALKPHOS 71 01/17/2023   BILITOT 0.4 01/17/2023      RADIOGRAPHY: DG Chest 2 View  Result Date: 01/17/2023 CLINICAL DATA:  Bronchoscopy EXAM: CHEST - 2 VIEW COMPARISON:  PET-CT, 12/15/2022 FINDINGS: The heart size and mediastinal contours are within normal limits. Spiculated nodule of the suprahilar left upper lobe is faintly appreciated, not significantly changed. Bandlike scarring of the lung bases. No pneumothorax. The visualized skeletal structures are unremarkable. IMPRESSION: 1. Spiculated nodule of the suprahilar left upper lobe is faintly appreciated, not significantly changed. 2. No pneumothorax. Electronically Signed   By: Jearld Lesch M.D.   On: 01/17/2023 11:56   CT ABDOMEN PELVIS W CONTRAST  Result Date: 01/12/2023 CLINICAL DATA:  Acute abdominal pain. Recent adrenal biopsy. New diagnosis of lung cancer. EXAM: CT ABDOMEN AND PELVIS WITH CONTRAST TECHNIQUE: Multidetector CT imaging of the abdomen and pelvis was performed using the standard protocol following bolus administration of intravenous contrast. RADIATION DOSE REDUCTION: This exam was performed according to the departmental dose-optimization program which includes automated exposure control, adjustment of the mA and/or kV according to patient size and/or use of iterative reconstruction technique. CONTRAST:  OMNIPAQUE IOHEXOL 300 MG/ML  SOLN  COMPARISON:  PET CT 12/15/2022.  Abdominopelvic CT 11/17/2022 FINDINGS: Lower chest: Cardiomegaly again seen.  Right lower lobe atelectasis. Hepatobiliary: No focal hepatic lesion. Gallbladder physiologically distended, no calcified stone. No biliary dilatation. Pancreas: No ductal dilatation or inflammation. Spleen: Normal in size without focal abnormality. Adrenals/Urinary Tract: 6.5 x 4 cm heterogeneous right adrenal mass, series 2, image 16, slightly increased in size from prior when it measured 5.2 x 3.1 cm. No left adrenal nodule. No hydronephrosis or renal stone. The urinary bladder is partially distended and unremarkable. Stomach/Bowel: No bowel obstruction or inflammation. Small to moderate volume of stool in the colon. The appendix is not confidently visualized. Vascular/Lymphatic: Aortic atherosclerosis without aneurysm. The portal vein is patent. No enlarged lymph nodes in the abdomen or pelvis. Reproductive: Status post hysterectomy. No adnexal masses. Other: No ascites. Focal fluid collection or free air. Small fat containing right inguinal hernia. Musculoskeletal: Lumbar degenerative change with Modic endplate changes L4-L5 and L5-S1. No acute osseous finding or focal bone lesion. IMPRESSION: 1. No acute abnormality or explanation for abdominal pain. 2. Slight interval increase in size of heterogeneous right adrenal mass, currently 6.5 x 4 cm, previously 5.2 x 3.1 cm. Aortic Atherosclerosis (ICD10-I70.0). Electronically Signed   By: Narda Rutherford M.D.   On: 01/12/2023 17:46   DG Chest Port 1 View  Result Date: 01/09/2023 CLINICAL DATA:  Bronchoscopy EXAM: PORTABLE CHEST 1 VIEW COMPARISON:  11/16/2022 FINDINGS: The heart size and mediastinal contours are within normal limits. Aortic atherosclerosis. Left suprahilar lung nodule better seen on recent CT. No pleural effusion or pneumothorax. Linear atelectasis at the right lung base. The visualized skeletal structures are unremarkable. IMPRESSION:  No pneumothorax following bronchoscopy. Electronically Signed   By: Duanne Guess D.O.   On: 01/09/2023 10:28   DG C-ARM BRONCHOSCOPY  Result Date: 01/09/2023 C-ARM BRONCHOSCOPY: Fluoroscopy was utilized by the requesting physician.  No radiographic interpretation.       IMPRESSION/PLAN: 1. Stage IV, NSCLC, adenocarcinoma of the LUL with metastasis  to the right adrenal gland. Dr. Mitzi Hansen discusses the pathology findings and reviews the nature of oligometastatic disease from lung cancer. She appears to be a good candidate for stereotactic body radiotherapy (SBRT) to both the right adrenal gland and left upper lobe target.  We discussed the risks, benefits, short, and long term effects of radiotherapy, as well as the curative intent, and the patient is interested in proceeding. Dr. Mitzi Hansen discusses the delivery and logistics of radiotherapy and anticipates a course of 3-5 fractions to both the LUL and right adrenal targets with SBRT. Written consent is obtained and placed in the chart, a copy was provided to the patient. The patient will come tomorrow for CT simulation and her appointments will be scheduled at that time. She will also follow up on 02/02/23 with medical oncology to make plans for systemic therapy as well. She will continue pain medication as outlined by Dr. Arbutus [facility]co.   In a visit lasting 60 minutes, greater than 50% of the time was spent face to face discussing the patient's condition, in preparation for the discussion, and coordinating the patient's care.   The above documentation reflects my direct findings during this shared patient visit. Please see the separate note by Dr. Mitzi Hansen on this date for the remainder of the patient's plan of care.    Osker Mason, [facility]   **Disclaimer: This note was dictated with voice recognition software. Similar sounding words can inadvertently be transcribed and this note may contain transcription errors which may not have been corrected upon  publication of note.**

## 2023-01-17 NOTE — Progress Notes (Signed)
History of Present Illness Erin Pacheco is a 72 y.o. female former smoker ( Quit 11/2022) referred to see Dr. Tonia Brooms  12/2022 for pulmonary nodule by Dr. Parke Simmers. 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    Synopsis 72 year old female that was referred for evaluation of a lung nodule past medical history of hypertension and tobacco use. Mother with ovarian cancer Brother with throat cancer. Patient has had a longstanding history of tobacco use. Patient went to the hospital in June 2024 was found to have 2 bilateral subsegmental PE. On the CT evaluation found to have a lung mass and what appeared to be a right-sided adrenal mass. 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 Now with a lung nodule in the chest after being seen by oncology is recommending repeat biopsy for consideration of access to the chest lesion to see if were dealing with 2 separate primary locations. She underwent robotic assisted bronchoscopy with biopsies 01/09/2023. She is here for follow up.   01/17/2023 Pt. Presents for  follow up after robotic assisted bronchoscopy with biopsies 01/09/2023.She states she has been doing ok. She states she has a little bit of pain., specifically her back which they feel is her adrenal gland.  She did require a blood transfusion on 8/8 of 2 units for HGB of 6.2. She has not had a follow up CBC.  We will do a follow up CBC today. We will also check a CXR to ensure there is no sign of bleeding.  We discussed that the biopsy of the left upper lung was positive for adenocarcinoma. This is the same type diagnosis as her adrenal mass, so the lung cancer is not two separate  primaries. She has follow up today with Dr. Mitzi Hansen for consideration of stereotactic body radiotherapy (SBRT) to both the right adrenal gland and left upper lobe target. She will also follow up on 02/02/23 with  medical oncology to make plans for systemic therapy as well. She does have right lower back pain, She will continue pain medication as outlined by Dr. Arbutus Ped.  She was treated with Virgel Bouquet and Incruse  in the hospital. She did not have shortness of breath today in the office, but I will give her a sample of Trelegy to use in case she finds she needs this. She understands how to use this medication.   Test Results: CXR 01/17/2023 The heart size and mediastinal contours are within normal limits. Spiculated nodule of the suprahilar left upper lobe is faintly appreciated, not significantly changed. Bandlike scarring of the lung bases. No pneumothorax. The visualized skeletal structures are unremarkable. These results have been independently reviewed by me. I have called the results to the patient's daughter [name] who was with her at today's OV . She will give the results to her Mother. [name] verbalized understanding    IMPRESSION: 1. Spiculated nodule of the suprahilar left upper lobe is faintly appreciated, not significantly changed. 2. No pneumothorax.  CBC 01/16/2023    Latest Ref Rng & Units 01/17/2023   12:41 PM 01/12/2023    1:11 PM 12/21/2022    4:55 PM  CBC  WBC 4.0 - 10.5 K/uL 8.9  9.8  9.2   Hemoglobin 12.0 - 15.0 g/dL 9.1  6.2  9.0   Hematocrit 36.0 - 46.0 % 28.3  19.6  26.3   Platelets 150 - 400 K/uL 392  503  512     I have reviewed the results of the post transfusion CBC. I have called these results to the patient's daughter [name] who was with her at her OV today. She will relay these results to her mother. HGB has increased from 6.2 to 9.1 after 2 units PRBC.[name] verbalized understanding. Cytology 01/09/2023 FINAL MICROSCOPIC DIAGNOSIS:   A. LUNG, LUL, NEEDLE ASPIRATION  BIOPSIES:  - Adenocarcinoma   Cytology 11/23/2022 A. ADRENAL MASS, RIGHT, BIOPSY:  -  Metastatic well differentiated adenocarcinoma.    PET scan 12/2022 July 2024 nuclear medicine pet imaging: Patient  with a hypermetabolic lesion in the right adrenal gland as well as 1 in the left upper lobe.     Latest Ref Rng & Units 01/17/2023   12:41 PM 01/12/2023    1:11 PM 12/21/2022    4:55 PM  CBC  WBC 4.0 - 10.5 K/uL 8.9  9.8  9.2   Hemoglobin 12.0 - 15.0 g/dL 9.1  6.2  9.0   Hematocrit 36.0 - 46.0 % 28.3  19.6  26.3   Platelets 150 - 400 K/uL 392  503  512        Latest Ref Rng & Units 01/17/2023   12:41 PM 01/12/2023    1:11 PM 01/09/2023    8:38 AM  BMP  Glucose 70 - 99 mg/dL 147  94  829   BUN 8 - 23 mg/dL 13  12  7    Creatinine 0.44 - 1.00 mg/dL 5.62  1.30  8.65   Sodium 135 - 145 mmol/L 138  139  138   Potassium 3.5 - 5.1 mmol/L 3.9  4.1  3.8   Chloride 98 - 111 mmol/L 105  105  100   CO2 22 - 32 mmol/L 28  29  26    Calcium 8.9 - 10.3 mg/dL 9.0  8.8  8.9     BNP    Component Value Date/Time   BNP 54.1 11/20/2022 0413    ProBNP No results found for: "PROBNP"  PFT No results found for: "FEV1PRE", "FEV1POST", "FVCPRE", "FVCPOST", "TLC", "DLCOUNC", "PREFEV1FVCRT", "PSTFEV1FVCRT"  DG Chest 2 View  Result Date: 01/17/2023 CLINICAL DATA:  Bronchoscopy EXAM: CHEST - 2 VIEW COMPARISON:  PET-CT, 12/15/2022 FINDINGS: The heart size and mediastinal contours are within normal limits. Spiculated nodule of the suprahilar left upper lobe is faintly appreciated, not significantly changed. Bandlike scarring of the lung bases. No pneumothorax. The visualized skeletal structures are unremarkable. IMPRESSION: 1. Spiculated nodule of the suprahilar left upper lobe is faintly appreciated, not significantly changed. 2. No pneumothorax. Electronically Signed   By: Jearld Lesch M.D.   On: 01/17/2023 11:56   CT ABDOMEN PELVIS W CONTRAST  Result Date: 01/12/2023 CLINICAL DATA:  Acute abdominal pain. Recent adrenal biopsy. New diagnosis of lung cancer. EXAM: CT ABDOMEN AND PELVIS WITH CONTRAST TECHNIQUE: Multidetector CT imaging of the abdomen and pelvis was performed using the standard protocol following  bolus administration of intravenous contrast. RADIATION DOSE REDUCTION: This exam was performed according to the departmental dose-optimization program which includes automated exposure control, adjustment of the mA and/or kV according to patient size and/or use of iterative reconstruction technique. CONTRAST:  OMNIPAQUE IOHEXOL 300 MG/ML  SOLN COMPARISON:  PET CT 12/15/2022.  Abdominopelvic CT 11/17/2022 FINDINGS: Lower chest: Cardiomegaly again seen.  Right lower lobe atelectasis. Hepatobiliary: No focal hepatic lesion. Gallbladder physiologically distended, no calcified stone. No biliary dilatation. Pancreas: No ductal dilatation or inflammation. Spleen: Normal in size without focal abnormality. Adrenals/Urinary Tract:  6.5 x 4 cm heterogeneous right adrenal mass, series 2, image 16, slightly increased in size from prior when it measured 5.2 x 3.1 cm. No left adrenal nodule. No hydronephrosis or renal stone. The urinary bladder is partially distended and unremarkable. Stomach/Bowel: No bowel obstruction or inflammation. Small to moderate volume of stool in the colon. The appendix is not confidently visualized. Vascular/Lymphatic: Aortic atherosclerosis without aneurysm. The portal vein is patent. No enlarged lymph nodes in the abdomen or pelvis. Reproductive: Status post hysterectomy. No adnexal masses. Other: No ascites. Focal fluid collection or free air. Small fat containing right inguinal hernia. Musculoskeletal: Lumbar degenerative change with Modic endplate changes L4-L5 and L5-S1. No acute osseous finding or focal bone lesion. IMPRESSION: 1. No acute abnormality or explanation for abdominal pain. 2. Slight interval increase in size of heterogeneous right adrenal mass, currently 6.5 x 4 cm, previously 5.2 x 3.1 cm. Aortic Atherosclerosis (ICD10-I70.0). Electronically Signed   By: Narda Rutherford M.D.   On: 01/12/2023 17:46   DG Chest Port 1 View  Result Date: 01/09/2023 CLINICAL DATA:  Bronchoscopy  EXAM: PORTABLE CHEST 1 VIEW COMPARISON:  11/16/2022 FINDINGS: The heart size and mediastinal contours are within normal limits. Aortic atherosclerosis. Left suprahilar lung nodule better seen on recent CT. No pleural effusion or pneumothorax. Linear atelectasis at the right lung base. The visualized skeletal structures are unremarkable. IMPRESSION: No pneumothorax following bronchoscopy. Electronically Signed   By: Duanne Guess D.O.   On: 01/09/2023 10:28   DG C-ARM BRONCHOSCOPY  Result Date: 01/09/2023 C-ARM BRONCHOSCOPY: Fluoroscopy was utilized by the requesting physician.  No radiographic interpretation.     Past medical hx Past Medical History:  Diagnosis Date   Arthritis    Cancer (HCC)    right adrenal adenocarcinoma 11/23/22   Cardiomyopathy (HCC)    COPD (chronic obstructive pulmonary disease) (HCC)    Hypertension    Pulmonary embolism (HCC) 11/16/2022   Tobacco abuse      Social History   Tobacco Use   Smoking status: Former    Current packs/day: 0.00    Average packs/day: 0.5 packs/day for 30.0 years (15.0 ttl pk-yrs)    Types: Cigarettes    Start date: 11/1992    Quit date: 11/2022    Years since quitting: 0.2   Smokeless tobacco: Never  Vaping Use   Vaping status: Never Used  Substance Use Topics   Alcohol use: No   Drug use: No    Ms.Maddox reports that she quit smoking about 2 months ago. Her smoking use included cigarettes. She started smoking about 30 years ago. She has a 15 pack-year smoking history. She has never used smokeless tobacco. She reports that she does not drink alcohol and does not use drugs.  Tobacco Cessation: Former smoker , quit 8 weeks  ago.    Past surgical hx, Family hx, Social hx all reviewed.  Current Outpatient Medications on File Prior to Visit  Medication Sig   acetaminophen (TYLENOL) 325 MG tablet Take 2 tablets (650 mg total) by mouth every 6 (six) hours as needed for mild pain (or Fever >/= 101).   apixaban (ELIQUIS) 5  MG TABS tablet Take 1 tablet (5 mg total) by mouth 2 (two) times daily.   Camphor-Menthol-Methyl Sal (SALONPAS) 3.06-11-08 % PTCH Place 1 patch onto the skin daily as needed (pain).   dexamethasone (DECADRON) 4 MG tablet Take 4 mg by mouth daily as needed (For arthritis pain).   fluticasone furoate-vilanterol (BREO ELLIPTA) 100-25 MCG/ACT AEPB Inhale  1 puff into the lungs daily.   losartan (COZAAR) 50 MG tablet Take 1 tablet (50 mg total) by mouth daily.   metoprolol succinate (TOPROL-XL) 50 MG 24 hr tablet Take 1 tablet (50 mg total) by mouth daily. Take with or immediately following a meal.   Multiple Vitamin (MULTIVITAMIN) tablet Take 1 tablet by mouth daily.     oxyCODONE (OXY IR/ROXICODONE) 5 MG immediate release tablet Take 1 tablet (5 mg total) by mouth every 6 (six) hours as needed for moderate pain.   Polyethyl Glycol-Propyl Glycol (SYSTANE OP) Place 1 drop into both eyes daily as needed (dry eye).   spironolactone (ALDACTONE) 25 MG tablet Take 0.5 tablets (12.5 mg total) by mouth daily.   enoxaparin (LOVENOX) 60 MG/0.6ML injection Inject 0.6 mLs (60 mg total) into the skin every 12 (twelve) hours for 2 days. Hold Lovenox 12 hours before EGD.   No current facility-administered medications on file prior to visit.     No Known Allergies  Review Of Systems:  Constitutional:   No  weight loss, night sweats,  Fevers, chills, fatigue, or  lassitude.  HEENT:   No headaches,  Difficulty swallowing,  Tooth/dental problems, or  Sore throat,                No sneezing, itching, ear ache, nasal congestion, post nasal drip,   CV:  No chest pain,  Orthopnea, PND, swelling in lower extremities, anasarca, dizziness, palpitations, syncope.   GI  No heartburn, indigestion, abdominal pain, nausea, vomiting, diarrhea, change in bowel habits, loss of appetite, bloody stools.   Resp: No shortness of breath with exertion or at rest.  No excess mucus, no productive cough,  No non-productive cough,  No  coughing up of blood.  No change in color of mucus.  No wheezing.  No chest wall deformity  Skin: no rash or lesions.  GU: no dysuria, change in color of urine, no urgency or frequency.  + right  flank pain, no hematuria   MS:  No joint pain or swelling.  No decreased range of motion.  No back pain.  Psych:  No change in mood or affect. No depression or anxiety.  No memory loss.   Vital Signs BP 120/60 (BP Location: Left Arm)   Pulse 79   Ht 5\' 2"  (1.575 m)   Wt 127 lb 3.2 oz (57.7 kg)   SpO2 95%   BMI 23.27 kg/m    Physical Exam:  General- No distress,  A&Ox3, pleasant ENT: No sinus tenderness, TM clear, pale nasal mucosa, no oral exudate,no post nasal drip, no LAN Cardiac: S1, S2, regular rate and rhythm, no murmur Chest: No wheeze/ rales/ dullness; no accessory muscle use, no nasal flaring, no sternal retractions, diminished per bases bilaterally Abd.: Soft Non-tender, ND, BS +, Body mass index is 23.27 kg/m.  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  Former smoker quit 8 weeks ago HGB drop to 6.1 on 01/12/2023 in patient on blood thinner Plan The biopsy of the left upper lobe of your lung is positive for adenocarcinoma. This is the same type of cancer as the adrenal biopsy result.  I have ordered a CXR today. I have ordered  a CBC to ensure your blood count has held after the 2  units of blood you received 01/12/23 in the emergency room. I will call you with the results>> called 01/17/2023 at 16:45 Follow up this afternoon with radiation oncology as is scheduled.  They will take great care of you.  Follow up 8/28 with medical oncology to discuss systemic therapy I will give you a sample of Trelegy in case you do have some shortness of breath as this was  prescribed in the hospital.  Call us if you need a prescription. Please contact office for sooner follow up if symptoms do not improve or worsen or seek emergency care  Good Luck with your therapy.  I spent 33 minutes dedicated to the care of this patient on the date of this encounter to include pre-visit review of records, face-to-face time with the patient discussing conditions above, post visit ordering of testing, clinical documentation with the electronic health record, making appropriate referrals as documented, and communicating necessary information to the patient's healthcare team.      Bevelyn Ngo, NP 01/17/2023  5:01 PM

## 2023-01-17 NOTE — Patient Instructions (Addendum)
It is good to see you today. The biopsy of the left upper lobe of your lung is positive for adenocarcinoma. This is the same as the adrenal biopsy result.  I have ordered a CXR today. I have ordered  a CBC to ensure your blood count has held after the 2 units of blood you received 01/12/23. I will call you with the results Follow up this afternoon with radiation oncology as is scheduled.  They will take great care of you.  I will give you a sample of Trelegy in case you do have some shortness of breath as this was prescribed in the hospital.  Call us if you need a prescription. Please contact office for sooner follow up if symptoms do not improve or worsen or seek emergency care

## 2023-01-18 ENCOUNTER — Other Ambulatory Visit: Payer: Self-pay

## 2023-01-18 ENCOUNTER — Ambulatory Visit
Admission: RE | Admit: 2023-01-18 | Discharge: 2023-01-18 | Disposition: A | Discharge status: Home / Self Care | Payer: Medicare HMO | Source: Ambulatory Visit | Attending: Radiation Oncology | Admitting: Radiation Oncology

## 2023-01-18 DIAGNOSIS — C7971 Secondary malignant neoplasm of right adrenal gland: Secondary | ICD-10-CM | POA: Diagnosis not present

## 2023-01-18 DIAGNOSIS — C7972 Secondary malignant neoplasm of left adrenal gland: Secondary | ICD-10-CM | POA: Insufficient documentation

## 2023-01-18 DIAGNOSIS — Z87891 Personal history of nicotine dependence: Secondary | ICD-10-CM | POA: Diagnosis not present

## 2023-01-18 DIAGNOSIS — C3412 Malignant neoplasm of upper lobe, left bronchus or lung: Secondary | ICD-10-CM | POA: Insufficient documentation

## 2023-01-19 ENCOUNTER — Other Ambulatory Visit: Payer: Self-pay | Admitting: *Deleted

## 2023-01-19 ENCOUNTER — Ambulatory Visit (HOSPITAL_COMMUNITY)
Admission: RE | Admit: 2023-01-19 | Discharge: 2023-01-19 | Disposition: A | Discharge status: Home / Self Care | Payer: Medicare HMO | Source: Ambulatory Visit | Attending: Family Medicine | Admitting: Family Medicine

## 2023-01-19 ENCOUNTER — Encounter: Payer: Self-pay | Admitting: *Deleted

## 2023-01-19 ENCOUNTER — Other Ambulatory Visit: Payer: Self-pay

## 2023-01-19 DIAGNOSIS — Z1231 Encounter for screening mammogram for malignant neoplasm of breast: Secondary | ICD-10-CM | POA: Diagnosis not present

## 2023-01-19 NOTE — Progress Notes (Signed)
The proposed treatment discussed in conference is for discussion purpose only and is not a binding recommendation.  The patients have not been physically examined, or presented with their treatment options.  Therefore, final treatment plans cannot be decided.  

## 2023-01-19 NOTE — Progress Notes (Signed)
Spoke to [place] in Pathology to confirm that [facility] studies had been requested on adrenal biopsy.  Request for PD-L1 added.

## 2023-01-20 NOTE — Progress Notes (Signed)
Reviewed at last pulmonary office visit   Thanks,  BLI  Josephine Igo, DO [facility] 01/20/2023 12:46 PM

## 2023-01-24 NOTE — Progress Notes (Signed)
FMLA Forms for Daughter completed as requested by Patient. Fax transmission confirmation received. Copy of forms placed for pick-up as requested by Patient. No other needs or concerns noted at this time.

## 2023-01-30 DIAGNOSIS — Z0000 Encounter for general adult medical examination without abnormal findings: Secondary | ICD-10-CM | POA: Diagnosis not present

## 2023-01-30 DIAGNOSIS — I2694 Multiple subsegmental pulmonary emboli without acute cor pulmonale: Secondary | ICD-10-CM | POA: Diagnosis not present

## 2023-01-30 DIAGNOSIS — C3492 Malignant neoplasm of unspecified part of left bronchus or lung: Secondary | ICD-10-CM | POA: Diagnosis not present

## 2023-01-30 DIAGNOSIS — C7971 Secondary malignant neoplasm of right adrenal gland: Secondary | ICD-10-CM | POA: Diagnosis not present

## 2023-01-30 DIAGNOSIS — D500 Iron deficiency anemia secondary to blood loss (chronic): Secondary | ICD-10-CM | POA: Diagnosis not present

## 2023-01-30 DIAGNOSIS — I5020 Unspecified systolic (congestive) heart failure: Secondary | ICD-10-CM | POA: Diagnosis not present

## 2023-01-31 NOTE — Progress Notes (Deleted)
[facility] OFFICE PROGRESS NOTE  Renaye Rakers, MD [address] [place] [address]  DIAGNOSIS:  Stage IV (T1c, N0, M1) Non-Small Cell Lung Cancer, adenocarcinoma. She presented with a spiculated left upper lobe lung nodule and large right adrenal gland mass. She was diagnosed in July 2024. She had molecular studies that showed she has KRASG12C which can be used in the second line setting.   PDL1: ***  PRIOR THERAPY: SBRT to the left upper lobe and right adrenal targets under the care of Dr. Mitzi Hansen.  Last dose of treatment expected on 02/13/2023  CURRENT THERAPY: 1)  Carboplatin for an AUC of 5, Alimta 500 mg/m, and Keytruda 200 mg IV every 3 weeks.  First dose expected on 02/09/2023 2) SBRT to the left upper lobe and right adrenal targets under the care of Dr. Mitzi Hansen.  Last dose of treatment expected on 02/13/2023  INTERVAL HISTORY: AHSAKI FESPERMAN 72 y.o. female returns to the clinic today for a follow up visit accompanied by ***. The patient established care in the clinic on ***. She was significaly anemic at that time. During the encounter, she started having flank pain. They did not feel comfortable waiting for a blood transfusion the following day so they opted to go to the ER to evaluate her flank pain and receive a blood transsfusion. Her work up was ***  When she was seen to establish care,   Overall, the patient is feeling okay today.  She also feels like her abdomen is a little bit more bloated.  Denies any abdominal pain.  She reports some stable fatigue.  She denies any visible bleeding. She is on eliquis for PE. He has been taking an iron supplement daily. She denies any epistaxis, gingival bleeding, hemoptysis, hematemesis, hematochezia, or hematuria.   She also received IV iron with venofer ***. Daughter ****submitted FMLA.    The patient does not exert herself so she denies any significant dyspnea on exertion.  She denies any nausea, vomiting, or diarrhea.   She takes medication for constipation.  Denies any chest pain.  Reports she has not been as hungry may be losing weight.  Certain smells to food are unappetizing and curb her appetite.   When she was last seen, we discussed that technically, the patient is stage IV given the metastatic adrenal lesion. Fortunately she does not have a high burden of disease. Dr. Arbutus Ped recommends referral to radiation oncology to see if she can receive radiation to the lung lesion and the adrenal gland lesion. She has been undergoing radiation and her last dose of treatment is expected on 02/13/23.   We requested molecular studies to determine systemic treatment options. Her molecular studies showed ***.   She is here for evaluation and for a more detailed discussion about her current condition and recommended treatment options.       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  MEDICATIONS:  Current Outpatient Medications  Medication Sig Dispense Refill   acetaminophen (TYLENOL) 325 MG tablet Take 2 tablets (650 mg total) by mouth every 6 (six) hours as needed for mild pain (or Fever >/= 101).  apixaban (ELIQUIS) 5 MG TABS tablet Take 1 tablet (5 mg total) by mouth 2 (two) times daily. 60 tablet 2   Camphor-Menthol-Methyl Sal (SALONPAS) 3.06-11-08 % PTCH Place 1 patch onto the skin daily as needed (pain).     dexamethasone (DECADRON) 4 MG tablet Take 4 mg by mouth daily as needed (For arthritis pain).     enoxaparin (LOVENOX) 60 MG/0.6ML injection Inject 0.6 mLs (60 mg total) into the skin every 12 (twelve) hours for 2 days. Hold Lovenox 12 hours before EGD. 2.4 mL 0   fluticasone furoate-vilanterol (BREO ELLIPTA) 100-25 MCG/ACT AEPB  Inhale 1 puff into the lungs daily. 28 each 0   Fluticasone-Umeclidin-Vilant (TRELEGY ELLIPTA) 100-62.5-25 MCG/ACT AEPB Inhale 1 each into the lungs daily.     losartan (COZAAR) 50 MG tablet Take 1 tablet (50 mg total) by mouth daily. 90 tablet 3   metoprolol succinate (TOPROL-XL) 50 MG 24 hr tablet Take 1 tablet (50 mg total) by mouth daily. Take with or immediately following a meal. 30 tablet 1   Multiple Vitamin (MULTIVITAMIN) tablet Take 1 tablet by mouth daily.       oxyCODONE (OXY IR/ROXICODONE) 5 MG immediate release tablet Take 1 tablet (5 mg total) by mouth every 6 (six) hours as needed for moderate pain. 60 tablet 0   Polyethyl Glycol-Propyl Glycol (SYSTANE OP) Place 1 drop into both eyes daily as needed (dry eye).     spironolactone (ALDACTONE) 25 MG tablet Take 0.5 tablets (12.5 mg total) by mouth daily. 30 tablet 1   No current facility-administered medications for this visit.    SURGICAL HISTORY:  Past Surgical History:  Procedure Laterality Date   ABDOMINAL HYSTERECTOMY     BILATERAL OOPHORECTOMY     BIOPSY  12/09/2022   Procedure: BIOPSY;  Surgeon: Imogene Burn, MD;  Location: [facility] ENDOSCOPY;  Service: Gastroenterology;;   BRONCHIAL BIOPSY  01/09/2023   Procedure: BRONCHIAL BIOPSIES;  Surgeon: Josephine Igo, DO;  Location: [facility] ENDOSCOPY;  Service: Pulmonary;;   BRONCHIAL NEEDLE ASPIRATION BIOPSY  01/09/2023   Procedure: BRONCHIAL NEEDLE ASPIRATION BIOPSIES;  Surgeon: Josephine Igo, DO;  Location: [facility] ENDOSCOPY;  Service: Pulmonary;;   CATARACT EXTRACTION W/PHACO  12/27/2010   Procedure: CATARACT EXTRACTION PHACO AND INTRAOCULAR LENS PLACEMENT (IOC);  Surgeon: Gemma Payor;  Location: [facility] ORS;  Service: Ophthalmology;  Laterality: Right;   CATARACT EXTRACTION W/PHACO  03/28/2011   Procedure: CATARACT EXTRACTION PHACO AND INTRAOCULAR LENS PLACEMENT (IOC);  Surgeon: Gemma Payor;  Location: [facility] ORS;  Service: Ophthalmology;  Laterality: Left;  CDE 7.27   ESOPHAGOGASTRODUODENOSCOPY  (EGD) WITH PROPOFOL N/A 12/09/2022   Procedure: ESOPHAGOGASTRODUODENOSCOPY (EGD) WITH PROPOFOL;  Surgeon: Imogene Burn, MD;  Location: [facility] ENDOSCOPY;  Service: Gastroenterology;  Laterality: N/A;    REVIEW OF SYSTEMS:   Review of Systems  Constitutional: Negative for appetite change, chills, fatigue, fever and unexpected weight change.  HENT:   Negative for mouth sores, nosebleeds, sore throat and trouble swallowing.   Eyes: Negative for eye problems and icterus.  Respiratory: Negative for cough, hemoptysis, shortness of breath and wheezing.   Cardiovascular: Negative for chest pain and leg swelling.  Gastrointestinal: Negative for abdominal pain, constipation, diarrhea, nausea and vomiting.  Genitourinary: Negative for bladder incontinence, difficulty urinating, dysuria, frequency and hematuria.   Musculoskeletal: Negative for back pain, gait problem, neck pain and neck stiffness.  Skin: Negative for itching and rash.  Neurological: Negative for dizziness, extremity weakness, gait problem, headaches, light-headedness and seizures.  Hematological: Negative for adenopathy.  Does not bruise/bleed easily.  Psychiatric/Behavioral: Negative for confusion, depression and sleep disturbance. The patient is not nervous/anxious.     PHYSICAL EXAMINATION:  There were no vitals taken for this visit.  ECOG PERFORMANCE STATUS: {CHL ONC ECOG [id number]  Physical Exam  Constitutional: Oriented to person, place, and time and well-developed, well-nourished, and in no distress. No distress.  HENT:  Head: Normocephalic and atraumatic.  Mouth/Throat: Oropharynx is clear and moist. No oropharyngeal exudate.  Eyes: Conjunctivae are normal. Right eye exhibits no discharge. Left eye exhibits no discharge. No scleral icterus.  Neck: Normal range of motion. Neck supple.  Cardiovascular: Normal rate, regular rhythm, normal heart sounds and intact distal pulses.   Pulmonary/Chest: Effort normal and breath  sounds normal. No respiratory distress. No wheezes. No rales.  Abdominal: Soft. Bowel sounds are normal. Exhibits no distension and no mass. There is no tenderness.  Musculoskeletal: Normal range of motion. Exhibits no edema.  Lymphadenopathy:    No cervical adenopathy.  Neurological: Alert and oriented to person, place, and time. Exhibits normal muscle tone. Gait normal. Coordination normal.  Skin: Skin is warm and dry. No rash noted. Not diaphoretic. No erythema. No pallor.  Psychiatric: Mood, memory and judgment normal.  Vitals reviewed.  LABORATORY DATA: Lab Results  Component Value Date   WBC 8.9 01/17/2023   HGB 9.1 (L) 01/17/2023   HCT 28.3 (L) 01/17/2023   MCV 88.7 01/17/2023   PLT 392 01/17/2023      Chemistry      Component Value Date/Time   NA 138 01/17/2023 1241   K 3.9 01/17/2023 1241   CL 105 01/17/2023 1241   CO2 28 01/17/2023 1241   BUN 13 01/17/2023 1241   CREATININE 0.98 01/17/2023 1241      Component Value Date/Time   CALCIUM 9.0 01/17/2023 1241   ALKPHOS 71 01/17/2023 1241   AST 15 01/17/2023 1241   ALT 14 01/17/2023 1241   BILITOT 0.4 01/17/2023 1241       RADIOGRAPHIC STUDIES:  MM 3D SCREENING MAMMOGRAM BILATERAL BREAST  Result Date: 01/20/2023 CLINICAL DATA:  Screening. EXAM: DIGITAL SCREENING BILATERAL MAMMOGRAM WITH TOMOSYNTHESIS AND CAD TECHNIQUE: Bilateral screening digital craniocaudal and mediolateral oblique mammograms were obtained. Bilateral screening digital breast tomosynthesis was performed. The images were evaluated with computer-aided detection. COMPARISON:  Previous exam(s). ACR Breast Density Category b: There are scattered areas of fibroglandular density. FINDINGS: There are no findings suspicious for malignancy. IMPRESSION: No mammographic evidence of malignancy. A result letter of this screening mammogram will be mailed directly to the patient. RECOMMENDATION: Screening mammogram in one year. (Code:SM-B-01Y) BI-RADS CATEGORY  1:  Negative. Electronically Signed   By: Sherian Rein M.D.   On: 01/20/2023 14:42   DG Chest 2 View  Result Date: 01/17/2023 CLINICAL DATA:  Bronchoscopy EXAM: CHEST - 2 VIEW COMPARISON:  PET-CT, 12/15/2022 FINDINGS: The heart size and mediastinal contours are within normal limits. Spiculated nodule of the suprahilar left upper lobe is faintly appreciated, not significantly changed. Bandlike scarring of the lung bases. No pneumothorax. The visualized skeletal structures are unremarkable. IMPRESSION: 1. Spiculated nodule of the suprahilar left upper lobe is faintly appreciated, not significantly changed. 2. No pneumothorax. Electronically Signed   By: Jearld Lesch M.D.   On: 01/17/2023 11:56   CT ABDOMEN PELVIS W CONTRAST  Result Date: 01/12/2023 CLINICAL DATA:  Acute abdominal pain. Recent adrenal biopsy. New diagnosis of lung cancer. EXAM: CT ABDOMEN AND PELVIS WITH CONTRAST TECHNIQUE: Multidetector CT imaging of the abdomen and pelvis  was performed using the standard protocol following bolus administration of intravenous contrast. RADIATION DOSE REDUCTION: This exam was performed according to the departmental dose-optimization program which includes automated exposure control, adjustment of the mA and/or kV according to patient size and/or use of iterative reconstruction technique. CONTRAST:  OMNIPAQUE IOHEXOL 300 MG/ML  SOLN COMPARISON:  PET CT 12/15/2022.  Abdominopelvic CT 11/17/2022 FINDINGS: Lower chest: Cardiomegaly again seen.  Right lower lobe atelectasis. Hepatobiliary: No focal hepatic lesion. Gallbladder physiologically distended, no calcified stone. No biliary dilatation. Pancreas: No ductal dilatation or inflammation. Spleen: Normal in size without focal abnormality. Adrenals/Urinary Tract: 6.5 x 4 cm heterogeneous right adrenal mass, series 2, image 16, slightly increased in size from prior when it measured 5.2 x 3.1 cm. No left adrenal nodule. No hydronephrosis or renal stone. The  urinary bladder is partially distended and unremarkable. Stomach/Bowel: No bowel obstruction or inflammation. Small to moderate volume of stool in the colon. The appendix is not confidently visualized. Vascular/Lymphatic: Aortic atherosclerosis without aneurysm. The portal vein is patent. No enlarged lymph nodes in the abdomen or pelvis. Reproductive: Status post hysterectomy. No adnexal masses. Other: No ascites. Focal fluid collection or free air. Small fat containing right inguinal hernia. Musculoskeletal: Lumbar degenerative change with Modic endplate changes L4-L5 and L5-S1. No acute osseous finding or focal bone lesion. IMPRESSION: 1. No acute abnormality or explanation for abdominal pain. 2. Slight interval increase in size of heterogeneous right adrenal mass, currently 6.5 x 4 cm, previously 5.2 x 3.1 cm. Aortic Atherosclerosis (ICD10-I70.0). Electronically Signed   By: Narda Rutherford M.D.   On: 01/12/2023 17:46   DG Chest Port 1 View  Result Date: 01/09/2023 CLINICAL DATA:  Bronchoscopy EXAM: PORTABLE CHEST 1 VIEW COMPARISON:  11/16/2022 FINDINGS: The heart size and mediastinal contours are within normal limits. Aortic atherosclerosis. Left suprahilar lung nodule better seen on recent CT. No pleural effusion or pneumothorax. Linear atelectasis at the right lung base. The visualized skeletal structures are unremarkable. IMPRESSION: No pneumothorax following bronchoscopy. Electronically Signed   By: Duanne Guess D.O.   On: 01/09/2023 10:28   DG C-ARM BRONCHOSCOPY  Result Date: 01/09/2023 C-ARM BRONCHOSCOPY: Fluoroscopy was utilized by the requesting physician.  No radiographic interpretation.     ASSESSMENT/PLAN:  This is a very pleasant 72 year old female with stage IV (T1c, N0, M1) Non-Small Cell Lung Cancer, adenocarcinoma. She presented with a spiculated left upper lobe lung nodule and large right adrenal gland mass. She was diagnosed in July 2024. She had molecular studies that showed she  has KRASG12C which can be used in the second line setting.  PDL1 ***  She underwent radiation to the adrenal lesion and the *** under the care of Dr. Mitzi Hansen. The last dose was on ***  Dr. Arbutus Ped had a lengthly discussion with the patient today about her current condition and treatment options. The patient was given the option of a referral to hospice/palliative vs. Treatment with systemic chemotherapy with carboplatin for an AUC of 5, Alimta 500 mg/m, and Keytruda 200 mg IV every 3 weeks.  The patient is interested in proceeding with systemic chemotherapy.  She is expected to start her first dose of this treatment on __.  We discussed the adverse side effects of treatment including but not limited to alopecia, myelosuppression, nausea and vomiting, peripheral neuropathy, liver or renal dysfunction as well as immunotherapy mediated adverse effects.   I will arrange for the patient to have a chemoeducation class prior to receiving her first cycle  of chemotherapy.   We will arrange for the patient to have a B12 injection while in the clinic today.     I sent prescriptions for 1 mg folic acid p.o. daily as well as Compazine 10 mg every 6 hours as needed for nausea.   The patient will follow-up in 2 weeks for a one-week follow-up visit after completing her first cycle of chemotherapy.  She will continue on her oral iron supplement. She has received IV iron with venofer 300 mg weekly x3.   FMLA ***done  The patient was advised to call immediately if she has any concerning symptoms in the interval. The patient voices understanding of current disease status and treatment options and is in agreement with the current care plan. All questions were answered. The patient knows to call the clinic with any problems, questions or concerns. We can certainly see the patient much sooner if necessary .        No orders of the defined types were placed in this encounter.    I spent {CHL ONC TIME VISIT -  XBJYN:[id number]} counseling the patient face to face. The total time spent in the appointment was {CHL ONC TIME VISIT - MVHQI:[id number]}.  Senica Crall L Kiauna Zywicki, [name] 01/31/23

## 2023-02-01 ENCOUNTER — Inpatient Hospital Stay: Payer: Medicare HMO

## 2023-02-01 ENCOUNTER — Encounter: Payer: Self-pay | Admitting: Radiation Oncology

## 2023-02-01 ENCOUNTER — Ambulatory Visit: Payer: Medicare HMO | Admitting: Radiation Oncology

## 2023-02-01 ENCOUNTER — Other Ambulatory Visit: Payer: Self-pay | Admitting: Physician Assistant

## 2023-02-01 DIAGNOSIS — D509 Iron deficiency anemia, unspecified: Secondary | ICD-10-CM | POA: Diagnosis not present

## 2023-02-01 DIAGNOSIS — Z7901 Long term (current) use of anticoagulants: Secondary | ICD-10-CM | POA: Diagnosis not present

## 2023-02-01 DIAGNOSIS — R918 Other nonspecific abnormal finding of lung field: Secondary | ICD-10-CM

## 2023-02-01 DIAGNOSIS — Z87891 Personal history of nicotine dependence: Secondary | ICD-10-CM | POA: Diagnosis not present

## 2023-02-01 DIAGNOSIS — C7971 Secondary malignant neoplasm of right adrenal gland: Secondary | ICD-10-CM | POA: Diagnosis not present

## 2023-02-01 DIAGNOSIS — D500 Iron deficiency anemia secondary to blood loss (chronic): Secondary | ICD-10-CM

## 2023-02-01 DIAGNOSIS — Z86711 Personal history of pulmonary embolism: Secondary | ICD-10-CM | POA: Diagnosis not present

## 2023-02-01 DIAGNOSIS — C3412 Malignant neoplasm of upper lobe, left bronchus or lung: Secondary | ICD-10-CM | POA: Diagnosis not present

## 2023-02-01 DIAGNOSIS — D649 Anemia, unspecified: Secondary | ICD-10-CM

## 2023-02-01 LAB — CMP (CANCER CENTER ONLY)
ALT: 12 U/L (ref 0–44)
AST: 13 U/L — ABNORMAL LOW (ref 15–41)
Albumin: 3.2 g/dL — ABNORMAL LOW (ref 3.5–5.0)
Alkaline Phosphatase: 72 U/L (ref 38–126)
Anion gap: 7 (ref 5–15)
BUN: 11 mg/dL (ref 8–23)
CO2: 28 mmol/L (ref 22–32)
Calcium: 9.3 mg/dL (ref 8.9–10.3)
Chloride: 103 mmol/L (ref 98–111)
Creatinine: 0.9 mg/dL (ref 0.44–1.00)
GFR, Estimated: >60 mL/min (ref >60)
Glucose, Bld: 106 mg/dL — ABNORMAL HIGH (ref 70–99)
Potassium: 3.8 mmol/L (ref 3.5–5.1)
Sodium: 138 mmol/L (ref 135–145)
Total Bilirubin: 0.3 mg/dL (ref 0.3–1.2)
Total Protein: 8.2 g/dL — ABNORMAL HIGH (ref 6.5–8.1)

## 2023-02-01 LAB — CBC WITH DIFFERENTIAL (CANCER CENTER ONLY)
Abs Immature Granulocytes: 0.05 10*3/uL (ref 0.00–0.07)
Basophils Absolute: 0.1 10*3/uL (ref 0.0–0.1)
Basophils Relative: 1 %
Eosinophils Absolute: 0.2 10*3/uL (ref 0.0–0.5)
Eosinophils Relative: 2 %
HCT: 28.7 % — ABNORMAL LOW (ref 36.0–46.0)
Hemoglobin: 9 g/dL — ABNORMAL LOW (ref 12.0–15.0)
Immature Granulocytes: 1 %
Lymphocytes Relative: 23 %
Lymphs Abs: 2.4 10*3/uL (ref 0.7–4.0)
MCH: 27.4 pg (ref 26.0–34.0)
MCHC: 31.4 g/dL (ref 30.0–36.0)
MCV: 87.2 fL (ref 80.0–100.0)
Monocytes Absolute: 0.8 10*3/uL (ref 0.1–1.0)
Monocytes Relative: 8 %
Neutro Abs: 6.8 10*3/uL (ref 1.7–7.7)
Neutrophils Relative %: 65 %
Platelet Count: 501 10*3/uL — ABNORMAL HIGH (ref 150–400)
RBC: 3.29 MIL/uL — ABNORMAL LOW (ref 3.87–5.11)
RDW: 16.7 % — ABNORMAL HIGH (ref 11.5–15.5)
WBC Count: 10.3 10*3/uL (ref 4.0–10.5)
nRBC: 0 % (ref 0.0–0.2)

## 2023-02-01 LAB — SAMPLE TO BLOOD BANK

## 2023-02-02 ENCOUNTER — Inpatient Hospital Stay: Payer: Medicare HMO | Admitting: Physician Assistant

## 2023-02-02 ENCOUNTER — Inpatient Hospital Stay: Payer: Medicare HMO

## 2023-02-03 ENCOUNTER — Ambulatory Visit: Payer: Medicare HMO | Admitting: Radiation Oncology

## 2023-02-03 DIAGNOSIS — C7972 Secondary malignant neoplasm of left adrenal gland: Secondary | ICD-10-CM | POA: Diagnosis not present

## 2023-02-03 DIAGNOSIS — C7971 Secondary malignant neoplasm of right adrenal gland: Secondary | ICD-10-CM | POA: Diagnosis not present

## 2023-02-03 DIAGNOSIS — Z87891 Personal history of nicotine dependence: Secondary | ICD-10-CM | POA: Diagnosis not present

## 2023-02-03 DIAGNOSIS — C3412 Malignant neoplasm of upper lobe, left bronchus or lung: Secondary | ICD-10-CM | POA: Diagnosis not present

## 2023-02-07 ENCOUNTER — Other Ambulatory Visit: Payer: Self-pay

## 2023-02-07 ENCOUNTER — Ambulatory Visit
Admission: RE | Admit: 2023-02-07 | Discharge: 2023-02-07 | Disposition: A | Discharge status: Home / Self Care | Payer: Medicare HMO | Source: Ambulatory Visit | Attending: Radiation Oncology | Admitting: Radiation Oncology

## 2023-02-07 ENCOUNTER — Other Ambulatory Visit (HOSPITAL_COMMUNITY): Payer: Medicare HMO

## 2023-02-07 ENCOUNTER — Ambulatory Visit: Payer: Medicare HMO | Admitting: Radiation Oncology

## 2023-02-07 DIAGNOSIS — Z510 Encounter for antineoplastic radiation therapy: Secondary | ICD-10-CM | POA: Diagnosis not present

## 2023-02-07 DIAGNOSIS — C3412 Malignant neoplasm of upper lobe, left bronchus or lung: Secondary | ICD-10-CM | POA: Insufficient documentation

## 2023-02-07 DIAGNOSIS — I110 Hypertensive heart disease with heart failure: Secondary | ICD-10-CM | POA: Diagnosis not present

## 2023-02-07 DIAGNOSIS — Z87891 Personal history of nicotine dependence: Secondary | ICD-10-CM | POA: Diagnosis not present

## 2023-02-07 DIAGNOSIS — C7971 Secondary malignant neoplasm of right adrenal gland: Secondary | ICD-10-CM | POA: Insufficient documentation

## 2023-02-07 DIAGNOSIS — I5020 Unspecified systolic (congestive) heart failure: Secondary | ICD-10-CM | POA: Diagnosis not present

## 2023-02-07 LAB — RAD ONC ARIA SESSION SUMMARY
Course Elapsed Days: 0
Plan Fractions Treated to Date: 1
Plan Fractions Treated to Date: 1
Plan Prescribed Dose Per Fraction: 12 Gy
Plan Prescribed Dose Per Fraction: 6 Gy
Plan Total Fractions Prescribed: 10
Plan Total Fractions Prescribed: 5
Plan Total Prescribed Dose: 60 Gy
Plan Total Prescribed Dose: 60 Gy
Reference Point Dosage Given to Date: 12 Gy
Reference Point Dosage Given to Date: 6 Gy
Reference Point Session Dosage Given: 12 Gy
Reference Point Session Dosage Given: 6 Gy
Session Number: 1

## 2023-02-08 ENCOUNTER — Ambulatory Visit
Admission: RE | Admit: 2023-02-08 | Discharge: 2023-02-08 | Disposition: A | Discharge status: Home / Self Care | Payer: Medicare HMO | Source: Ambulatory Visit | Attending: Radiation Oncology | Admitting: Radiation Oncology

## 2023-02-08 ENCOUNTER — Other Ambulatory Visit: Payer: Self-pay

## 2023-02-08 ENCOUNTER — Telehealth: Payer: Self-pay | Admitting: Radiation Oncology

## 2023-02-08 ENCOUNTER — Ambulatory Visit: Payer: Medicare HMO | Admitting: Radiation Oncology

## 2023-02-08 DIAGNOSIS — I5020 Unspecified systolic (congestive) heart failure: Secondary | ICD-10-CM | POA: Diagnosis not present

## 2023-02-08 DIAGNOSIS — Z510 Encounter for antineoplastic radiation therapy: Secondary | ICD-10-CM | POA: Diagnosis not present

## 2023-02-08 DIAGNOSIS — Z87891 Personal history of nicotine dependence: Secondary | ICD-10-CM | POA: Diagnosis not present

## 2023-02-08 DIAGNOSIS — C7971 Secondary malignant neoplasm of right adrenal gland: Secondary | ICD-10-CM | POA: Diagnosis not present

## 2023-02-08 DIAGNOSIS — C3412 Malignant neoplasm of upper lobe, left bronchus or lung: Secondary | ICD-10-CM | POA: Diagnosis not present

## 2023-02-08 DIAGNOSIS — I110 Hypertensive heart disease with heart failure: Secondary | ICD-10-CM | POA: Diagnosis not present

## 2023-02-08 LAB — RAD ONC ARIA SESSION SUMMARY
Course Elapsed Days: 1
Plan Fractions Treated to Date: 2
Plan Prescribed Dose Per Fraction: 6 Gy
Plan Total Fractions Prescribed: 10
Plan Total Prescribed Dose: 60 Gy
Reference Point Dosage Given to Date: 12 Gy
Reference Point Session Dosage Given: 6 Gy
Session Number: 2

## 2023-02-08 NOTE — Telephone Encounter (Signed)
I spoke with the patient this morning to follow-up with her.  She was very frustrated last week after finding out that what had originally been hopeful for 5 sessions of radiation needed to be longer because of the close proximity of her bowel to the treatment plan.  She spent time speaking with Dr. Mitzi Hansen last week about this as well.  I tried to emphasize that the decision making was not because of lack of planning or time to coordinate her care but that it did come down to last-minute changes that needed to be made in order to have her treatment be prescribed as safely as possible.  She is in agreement and will continue her current treatment plan.

## 2023-02-09 ENCOUNTER — Ambulatory Visit
Admission: RE | Admit: 2023-02-09 | Discharge: 2023-02-09 | Disposition: A | Discharge status: Home / Self Care | Payer: Medicare HMO | Source: Ambulatory Visit | Attending: Radiation Oncology | Admitting: Radiation Oncology

## 2023-02-09 ENCOUNTER — Ambulatory Visit: Payer: Medicare HMO | Admitting: Radiation Oncology

## 2023-02-09 ENCOUNTER — Other Ambulatory Visit: Payer: Self-pay | Admitting: Radiation Oncology

## 2023-02-09 ENCOUNTER — Other Ambulatory Visit: Payer: Self-pay

## 2023-02-09 DIAGNOSIS — C7971 Secondary malignant neoplasm of right adrenal gland: Secondary | ICD-10-CM | POA: Diagnosis not present

## 2023-02-09 DIAGNOSIS — C3412 Malignant neoplasm of upper lobe, left bronchus or lung: Secondary | ICD-10-CM | POA: Diagnosis not present

## 2023-02-09 DIAGNOSIS — Z510 Encounter for antineoplastic radiation therapy: Secondary | ICD-10-CM | POA: Diagnosis not present

## 2023-02-09 DIAGNOSIS — I5020 Unspecified systolic (congestive) heart failure: Secondary | ICD-10-CM | POA: Diagnosis not present

## 2023-02-09 DIAGNOSIS — I110 Hypertensive heart disease with heart failure: Secondary | ICD-10-CM | POA: Diagnosis not present

## 2023-02-09 DIAGNOSIS — Z87891 Personal history of nicotine dependence: Secondary | ICD-10-CM | POA: Diagnosis not present

## 2023-02-09 LAB — RAD ONC ARIA SESSION SUMMARY
Course Elapsed Days: 2
Plan Fractions Treated to Date: 2
Plan Fractions Treated to Date: 3
Plan Prescribed Dose Per Fraction: 12 Gy
Plan Prescribed Dose Per Fraction: 6 Gy
Plan Total Fractions Prescribed: 10
Plan Total Fractions Prescribed: 5
Plan Total Prescribed Dose: 60 Gy
Plan Total Prescribed Dose: 60 Gy
Reference Point Dosage Given to Date: 18 Gy
Reference Point Dosage Given to Date: 24 Gy
Reference Point Session Dosage Given: 12 Gy
Reference Point Session Dosage Given: 6 Gy
Session Number: 3

## 2023-02-09 MED ORDER — ONDANSETRON HCL 8 MG PO TABS
8.0000 mg | ORAL_TABLET | Freq: Three times a day (TID) | ORAL | 0 refills | Status: AC | PRN
Start: 2023-02-09 — End: ?

## 2023-02-09 MED ORDER — ONDANSETRON HCL 8 MG PO TABS: 8.0000 mg | ORAL_TABLET | Freq: Three times a day (TID) | ORAL | 0 refills | Status: DC | PRN

## 2023-02-10 ENCOUNTER — Ambulatory Visit: Payer: Medicare HMO | Admitting: Radiation Oncology

## 2023-02-10 ENCOUNTER — Ambulatory Visit
Admission: RE | Admit: 2023-02-10 | Discharge: 2023-02-10 | Disposition: A | Discharge status: Home / Self Care | Payer: Medicare HMO | Source: Ambulatory Visit | Attending: Radiation Oncology

## 2023-02-10 ENCOUNTER — Ambulatory Visit
Admission: RE | Admit: 2023-02-10 | Discharge: 2023-02-10 | Disposition: A | Discharge status: Home / Self Care | Payer: Medicare HMO | Source: Ambulatory Visit | Attending: Radiation Oncology | Admitting: Radiation Oncology

## 2023-02-10 ENCOUNTER — Other Ambulatory Visit: Payer: Self-pay

## 2023-02-10 DIAGNOSIS — C3412 Malignant neoplasm of upper lobe, left bronchus or lung: Secondary | ICD-10-CM | POA: Diagnosis not present

## 2023-02-10 DIAGNOSIS — Z87891 Personal history of nicotine dependence: Secondary | ICD-10-CM | POA: Diagnosis not present

## 2023-02-10 DIAGNOSIS — Z510 Encounter for antineoplastic radiation therapy: Secondary | ICD-10-CM | POA: Diagnosis not present

## 2023-02-10 DIAGNOSIS — C7971 Secondary malignant neoplasm of right adrenal gland: Secondary | ICD-10-CM | POA: Diagnosis not present

## 2023-02-10 DIAGNOSIS — I110 Hypertensive heart disease with heart failure: Secondary | ICD-10-CM | POA: Diagnosis not present

## 2023-02-10 DIAGNOSIS — I5020 Unspecified systolic (congestive) heart failure: Secondary | ICD-10-CM | POA: Diagnosis not present

## 2023-02-10 LAB — RAD ONC ARIA SESSION SUMMARY
Course Elapsed Days: 3
Plan Fractions Treated to Date: 4
Plan Prescribed Dose Per Fraction: 6 Gy
Plan Total Fractions Prescribed: 10
Plan Total Prescribed Dose: 60 Gy
Reference Point Dosage Given to Date: 24 Gy
Reference Point Session Dosage Given: 6 Gy
Session Number: 4

## 2023-02-13 ENCOUNTER — Ambulatory Visit: Payer: Medicare HMO

## 2023-02-13 ENCOUNTER — Other Ambulatory Visit: Payer: Self-pay

## 2023-02-13 ENCOUNTER — Ambulatory Visit
Admission: RE | Admit: 2023-02-13 | Discharge: 2023-02-13 | Disposition: A | Discharge status: Home / Self Care | Payer: Medicare HMO | Source: Ambulatory Visit | Attending: Radiation Oncology | Admitting: Radiation Oncology

## 2023-02-13 DIAGNOSIS — I5020 Unspecified systolic (congestive) heart failure: Secondary | ICD-10-CM | POA: Diagnosis not present

## 2023-02-13 DIAGNOSIS — C3412 Malignant neoplasm of upper lobe, left bronchus or lung: Secondary | ICD-10-CM | POA: Diagnosis not present

## 2023-02-13 DIAGNOSIS — Z87891 Personal history of nicotine dependence: Secondary | ICD-10-CM | POA: Diagnosis not present

## 2023-02-13 DIAGNOSIS — I110 Hypertensive heart disease with heart failure: Secondary | ICD-10-CM | POA: Diagnosis not present

## 2023-02-13 DIAGNOSIS — Z510 Encounter for antineoplastic radiation therapy: Secondary | ICD-10-CM | POA: Diagnosis not present

## 2023-02-13 DIAGNOSIS — C7971 Secondary malignant neoplasm of right adrenal gland: Secondary | ICD-10-CM | POA: Diagnosis not present

## 2023-02-13 LAB — RAD ONC ARIA SESSION SUMMARY
Course Elapsed Days: 6
Plan Fractions Treated to Date: 3
Plan Fractions Treated to Date: 5
Plan Prescribed Dose Per Fraction: 12 Gy
Plan Prescribed Dose Per Fraction: 6 Gy
Plan Total Fractions Prescribed: 10
Plan Total Fractions Prescribed: 5
Plan Total Prescribed Dose: 60 Gy
Plan Total Prescribed Dose: 60 Gy
Reference Point Dosage Given to Date: 30 Gy
Reference Point Dosage Given to Date: 36 Gy
Reference Point Session Dosage Given: 12 Gy
Reference Point Session Dosage Given: 6 Gy
Session Number: 5

## 2023-02-14 ENCOUNTER — Ambulatory Visit
Admission: RE | Admit: 2023-02-14 | Discharge: 2023-02-14 | Disposition: A | Discharge status: Home / Self Care | Payer: Medicare HMO | Source: Ambulatory Visit | Attending: Radiation Oncology | Admitting: Radiation Oncology

## 2023-02-14 ENCOUNTER — Other Ambulatory Visit: Payer: Self-pay

## 2023-02-14 DIAGNOSIS — C3412 Malignant neoplasm of upper lobe, left bronchus or lung: Secondary | ICD-10-CM | POA: Diagnosis not present

## 2023-02-14 DIAGNOSIS — I110 Hypertensive heart disease with heart failure: Secondary | ICD-10-CM | POA: Diagnosis not present

## 2023-02-14 DIAGNOSIS — Z87891 Personal history of nicotine dependence: Secondary | ICD-10-CM | POA: Diagnosis not present

## 2023-02-14 DIAGNOSIS — I5020 Unspecified systolic (congestive) heart failure: Secondary | ICD-10-CM | POA: Diagnosis not present

## 2023-02-14 DIAGNOSIS — Z510 Encounter for antineoplastic radiation therapy: Secondary | ICD-10-CM | POA: Diagnosis not present

## 2023-02-14 DIAGNOSIS — C7971 Secondary malignant neoplasm of right adrenal gland: Secondary | ICD-10-CM | POA: Diagnosis not present

## 2023-02-14 LAB — RAD ONC ARIA SESSION SUMMARY
Course Elapsed Days: 7
Plan Fractions Treated to Date: 6
Plan Prescribed Dose Per Fraction: 6 Gy
Plan Total Fractions Prescribed: 10
Plan Total Prescribed Dose: 60 Gy
Reference Point Dosage Given to Date: 36 Gy
Reference Point Session Dosage Given: 6 Gy
Session Number: 6

## 2023-02-15 ENCOUNTER — Ambulatory Visit (HOSPITAL_BASED_OUTPATIENT_CLINIC_OR_DEPARTMENT_OTHER): Payer: Medicare HMO

## 2023-02-15 ENCOUNTER — Other Ambulatory Visit: Payer: Self-pay

## 2023-02-15 ENCOUNTER — Ambulatory Visit: Payer: Medicare HMO | Admitting: Radiation Oncology

## 2023-02-15 ENCOUNTER — Ambulatory Visit
Admission: RE | Admit: 2023-02-15 | Discharge: 2023-02-15 | Disposition: A | Discharge status: Home / Self Care | Payer: Medicare HMO | Source: Ambulatory Visit | Attending: Radiation Oncology | Admitting: Radiation Oncology

## 2023-02-15 DIAGNOSIS — Z510 Encounter for antineoplastic radiation therapy: Secondary | ICD-10-CM | POA: Diagnosis not present

## 2023-02-15 DIAGNOSIS — I5020 Unspecified systolic (congestive) heart failure: Secondary | ICD-10-CM | POA: Insufficient documentation

## 2023-02-15 DIAGNOSIS — I110 Hypertensive heart disease with heart failure: Secondary | ICD-10-CM | POA: Diagnosis not present

## 2023-02-15 DIAGNOSIS — C7971 Secondary malignant neoplasm of right adrenal gland: Secondary | ICD-10-CM | POA: Diagnosis not present

## 2023-02-15 DIAGNOSIS — C3412 Malignant neoplasm of upper lobe, left bronchus or lung: Secondary | ICD-10-CM | POA: Diagnosis not present

## 2023-02-15 DIAGNOSIS — Z87891 Personal history of nicotine dependence: Secondary | ICD-10-CM | POA: Diagnosis not present

## 2023-02-15 LAB — RAD ONC ARIA SESSION SUMMARY
Course Elapsed Days: 8
Plan Fractions Treated to Date: 4
Plan Fractions Treated to Date: 7
Plan Prescribed Dose Per Fraction: 12 Gy
Plan Prescribed Dose Per Fraction: 6 Gy
Plan Total Fractions Prescribed: 10
Plan Total Fractions Prescribed: 5
Plan Total Prescribed Dose: 60 Gy
Plan Total Prescribed Dose: 60 Gy
Reference Point Dosage Given to Date: 42 Gy
Reference Point Dosage Given to Date: 48 Gy
Reference Point Session Dosage Given: 12 Gy
Reference Point Session Dosage Given: 6 Gy
Session Number: 7

## 2023-02-15 LAB — ECHOCARDIOGRAM LIMITED
Area-P 1/2: 3.58 cm2
S' Lateral: 4.1 cm

## 2023-02-15 MED ORDER — PERFLUTREN LIPID MICROSPHERE
1.0000 mL | INTRAVENOUS | Status: AC | PRN
Start: 2023-02-15 — End: 2023-02-15
  Administered 2023-02-15: 1 mL via INTRAVENOUS

## 2023-02-16 ENCOUNTER — Ambulatory Visit
Admission: RE | Admit: 2023-02-16 | Discharge: 2023-02-16 | Disposition: A | Discharge status: Home / Self Care | Payer: Medicare HMO | Source: Ambulatory Visit | Attending: Radiation Oncology | Admitting: Radiation Oncology

## 2023-02-16 ENCOUNTER — Other Ambulatory Visit: Payer: Self-pay

## 2023-02-16 DIAGNOSIS — I110 Hypertensive heart disease with heart failure: Secondary | ICD-10-CM | POA: Diagnosis not present

## 2023-02-16 DIAGNOSIS — I5020 Unspecified systolic (congestive) heart failure: Secondary | ICD-10-CM | POA: Diagnosis not present

## 2023-02-16 DIAGNOSIS — Z87891 Personal history of nicotine dependence: Secondary | ICD-10-CM | POA: Diagnosis not present

## 2023-02-16 DIAGNOSIS — C3412 Malignant neoplasm of upper lobe, left bronchus or lung: Secondary | ICD-10-CM | POA: Diagnosis not present

## 2023-02-16 DIAGNOSIS — Z510 Encounter for antineoplastic radiation therapy: Secondary | ICD-10-CM | POA: Diagnosis not present

## 2023-02-16 DIAGNOSIS — C7971 Secondary malignant neoplasm of right adrenal gland: Secondary | ICD-10-CM | POA: Diagnosis not present

## 2023-02-16 LAB — RAD ONC ARIA SESSION SUMMARY
Course Elapsed Days: 9
Plan Fractions Treated to Date: 8
Plan Prescribed Dose Per Fraction: 6 Gy
Plan Total Fractions Prescribed: 10
Plan Total Prescribed Dose: 60 Gy
Reference Point Dosage Given to Date: 48 Gy
Reference Point Session Dosage Given: 6 Gy
Session Number: 8

## 2023-02-17 ENCOUNTER — Ambulatory Visit: Payer: Medicare HMO | Admitting: Radiation Oncology

## 2023-02-17 ENCOUNTER — Ambulatory Visit
Admission: RE | Admit: 2023-02-17 | Discharge: 2023-02-17 | Disposition: A | Discharge status: Home / Self Care | Payer: Medicare HMO | Source: Ambulatory Visit | Attending: Radiation Oncology

## 2023-02-17 ENCOUNTER — Other Ambulatory Visit: Payer: Self-pay

## 2023-02-17 ENCOUNTER — Ambulatory Visit
Admission: RE | Admit: 2023-02-17 | Discharge: 2023-02-17 | Disposition: A | Discharge status: Home / Self Care | Payer: Medicare HMO | Source: Ambulatory Visit | Attending: Radiation Oncology | Admitting: Radiation Oncology

## 2023-02-17 DIAGNOSIS — Z510 Encounter for antineoplastic radiation therapy: Secondary | ICD-10-CM | POA: Diagnosis not present

## 2023-02-17 DIAGNOSIS — I5020 Unspecified systolic (congestive) heart failure: Secondary | ICD-10-CM | POA: Diagnosis not present

## 2023-02-17 DIAGNOSIS — I110 Hypertensive heart disease with heart failure: Secondary | ICD-10-CM | POA: Diagnosis not present

## 2023-02-17 DIAGNOSIS — C7971 Secondary malignant neoplasm of right adrenal gland: Secondary | ICD-10-CM | POA: Diagnosis not present

## 2023-02-17 DIAGNOSIS — Z87891 Personal history of nicotine dependence: Secondary | ICD-10-CM | POA: Diagnosis not present

## 2023-02-17 DIAGNOSIS — C3412 Malignant neoplasm of upper lobe, left bronchus or lung: Secondary | ICD-10-CM | POA: Diagnosis not present

## 2023-02-17 LAB — RAD ONC ARIA SESSION SUMMARY
Course Elapsed Days: 10
Plan Fractions Treated to Date: 5
Plan Fractions Treated to Date: 9
Plan Prescribed Dose Per Fraction: 12 Gy
Plan Prescribed Dose Per Fraction: 6 Gy
Plan Total Fractions Prescribed: 10
Plan Total Fractions Prescribed: 5
Plan Total Prescribed Dose: 60 Gy
Plan Total Prescribed Dose: 60 Gy
Reference Point Dosage Given to Date: 54 Gy
Reference Point Dosage Given to Date: 60 Gy
Reference Point Session Dosage Given: 12 Gy
Reference Point Session Dosage Given: 6 Gy
Session Number: 9

## 2023-02-20 ENCOUNTER — Other Ambulatory Visit: Payer: Self-pay

## 2023-02-20 ENCOUNTER — Ambulatory Visit
Admission: RE | Admit: 2023-02-20 | Discharge: 2023-02-20 | Disposition: A | Discharge status: Home / Self Care | Payer: Medicare HMO | Source: Ambulatory Visit | Attending: Radiation Oncology | Admitting: Radiation Oncology

## 2023-02-20 DIAGNOSIS — I110 Hypertensive heart disease with heart failure: Secondary | ICD-10-CM | POA: Diagnosis not present

## 2023-02-20 DIAGNOSIS — I5020 Unspecified systolic (congestive) heart failure: Secondary | ICD-10-CM | POA: Diagnosis not present

## 2023-02-20 DIAGNOSIS — C3412 Malignant neoplasm of upper lobe, left bronchus or lung: Secondary | ICD-10-CM | POA: Diagnosis not present

## 2023-02-20 DIAGNOSIS — Z87891 Personal history of nicotine dependence: Secondary | ICD-10-CM | POA: Diagnosis not present

## 2023-02-20 DIAGNOSIS — C7971 Secondary malignant neoplasm of right adrenal gland: Secondary | ICD-10-CM | POA: Diagnosis not present

## 2023-02-20 DIAGNOSIS — Z510 Encounter for antineoplastic radiation therapy: Secondary | ICD-10-CM | POA: Diagnosis not present

## 2023-02-20 LAB — RAD ONC ARIA SESSION SUMMARY
Course Elapsed Days: 13
Plan Fractions Treated to Date: 10
Plan Prescribed Dose Per Fraction: 6 Gy
Plan Total Fractions Prescribed: 10
Plan Total Prescribed Dose: 60 Gy
Reference Point Dosage Given to Date: 60 Gy
Reference Point Session Dosage Given: 6 Gy
Session Number: 10

## 2023-02-21 ENCOUNTER — Other Ambulatory Visit: Payer: Self-pay | Admitting: Physician Assistant

## 2023-02-21 ENCOUNTER — Telehealth: Payer: Self-pay

## 2023-02-21 DIAGNOSIS — D500 Iron deficiency anemia secondary to blood loss (chronic): Secondary | ICD-10-CM

## 2023-02-21 DIAGNOSIS — R918 Other nonspecific abnormal finding of lung field: Secondary | ICD-10-CM

## 2023-02-21 NOTE — Telephone Encounter (Addendum)
Called patient regarding results, left vm for patient to call office back.  ----- Message from Azalee Course sent at 02/17/2023  8:21 AM EDT ----- Pumping function of heart unchanged when compare to previous echo in June. Let's switch losartan to a stronger heart failure medication, discontinue losartan, transition to Entresto 49-51mg  BID. Please give the patient a 30 day coupon for the entresto as well. Obtain BMET in 2 weeks after starting Entresto. Dr. Antoine Poche to further review echo report on follow up.

## 2023-02-21 NOTE — Progress Notes (Signed)
[facility] OFFICE PROGRESS NOTE  Renaye Rakers, MD [address] [place] [address]  DIAGNOSIS:  Stage IV (T1c, N0, M1) Non-Small Cell Lung Cancer, adenocarcinoma. She presented with a spiculated left upper lobe lung nodule and large right adrenal gland mass. She was diagnosed in July 2024. She had molecular studies that showed she has KRASG12C which can be used in the second line setting.   PDL1: 1%  PRIOR THERAPY: SBRT to the left upper lobe and right adrenal targets under the care of Dr. Mitzi Hansen.  Last dose of treatment expected on 02/20/2023  CURRENT THERAPY: None  INTERVAL HISTORY: CATHALEYA DELAWDER 72 y.o. female returns to the clinic today for a follow up visit accompanied by her daughter. The patient established care in the clinic on 01/12/23. She was significaly anemic at that time. During the encounter, she started having flank pain. Of note, The patient had post biopsy hemorrhage of the adrenal biopsy within the mass extending into the right perinephric space. When She was seen in the ER, her work up was negative for acute etiology. They did not feel comfortable waiting for a blood transfusion the following day so they opted to go to the ER to evaluate her flank pain and receive a blood transfusion.   When she was last seen, we discussed that technically, the patient is stage IV given the metastatic adrenal lesion. Fortunately she does not have a high burden of disease. Dr. Arbutus Ped recommends referral to radiation oncology to see if she can receive radiation to the lung lesion and the adrenal gland lesion. She had been undergoing radiation and her last dose of treatment was on 02/20/23. The plan was to see her back today to discuss systemic treatment options. We requested molecular studies to determine systemic treatment options. Her molecular studies showed no actionable mutations. Her PDL1 is low at 1%  Unfortunately, the patient continues to be significantly anemic  today. Her Hbg was 9.0 3 weeks ago.  She is compliant with her iron supplement and takes this every day.  We will be arranging for IV iron infusions starting next week if possible.  To the patient's knowledge she denies any visible bleeding including epistaxis, gingival bleeding, hemoptysis, hematemesis or hematuria; however, she has been having dark stools for several months since she was hospitalized in June for PE, for which she is currently on Eliquis and complaint.  She has never had a colonoscopy.  She did have an upper endoscopy on 12/09/2022 which showed localized inflammation with congestion, erosion and erythema in the gastric antrum.  There is a single angioectasia without bleeding in the duodenal bulb. She does not take a PPI.   Overall she is feeling fair today except her main complaint is related to decreased appetite and weight loss.  She lost 7 pounds this week.  She does not drink any boost or Ensure.  She reports that her breathing is "fine".  She denies any significant shortness of breath.  She does have a cough which produces clear phlegm for which she takes Mucinex and Claritin.  Denies any chest pain or hemoptysis.  She will have nausea "every once in a blue moon" without any vomiting. She is here for evaluation and for a more detailed discussion about her current condition and recommended treatment options.       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  MEDICATIONS:  Current Outpatient Medications  Medication Sig Dispense Refill   acetaminophen (TYLENOL) 325 MG tablet Take 2 tablets (650 mg total) by mouth every 6 (six) hours as needed for mild pain (or Fever >/= 101).     apixaban (ELIQUIS) 5 MG TABS  tablet Take 1 tablet (5 mg total) by mouth 2 (two) times daily. 60 tablet 2   Camphor-Menthol-Methyl Sal (SALONPAS) 3.06-11-08 % PTCH Place 1 patch onto the skin daily as needed (pain).     dexamethasone (DECADRON) 4 MG tablet Take 4 mg by mouth daily as needed (For arthritis pain).     enoxaparin (LOVENOX) 60 MG/0.6ML injection Inject 0.6 mLs (60 mg total) into the skin every 12 (twelve) hours for 2 days. Hold Lovenox 12 hours before EGD. 2.4 mL 0   fluticasone furoate-vilanterol (BREO ELLIPTA) 100-25 MCG/ACT AEPB Inhale 1 puff into the lungs daily. 28 each 0   Fluticasone-Umeclidin-Vilant (TRELEGY ELLIPTA) 100-62.5-25 MCG/ACT AEPB Inhale 1 each into the lungs daily.     losartan (COZAAR) 50 MG tablet Take 1 tablet (50 mg total) by mouth daily. 90 tablet 3   metoprolol succinate (TOPROL-XL) 50 MG 24 hr tablet Take 1 tablet (50 mg total) by mouth daily. Take with or immediately following a meal. 30 tablet 1   Multiple Vitamin (MULTIVITAMIN) tablet Take 1 tablet by mouth daily.       ondansetron (ZOFRAN) 8 MG tablet Take 1 tablet (8 mg total) by mouth every 8 (eight) hours as needed for nausea or vomiting. 30 tablet 0   oxyCODONE (OXY IR/ROXICODONE) 5 MG immediate release tablet Take 1 tablet (5 mg total) by mouth every 6 (six) hours as needed for moderate pain. 60 tablet 0   Polyethyl Glycol-Propyl Glycol (SYSTANE OP) Place 1 drop into both eyes daily as needed (dry eye).     spironolactone (ALDACTONE) 25 MG tablet Take 0.5 tablets (12.5 mg total) by mouth daily. 30 tablet 1   No current facility-administered medications for this visit.    SURGICAL HISTORY:  Past Surgical History:  Procedure Laterality Date   ABDOMINAL HYSTERECTOMY     BILATERAL OOPHORECTOMY     BIOPSY  12/09/2022   Procedure: BIOPSY;  Surgeon: Imogene Burn, MD;  Location: [facility] ENDOSCOPY;  Service: Gastroenterology;;   BRONCHIAL BIOPSY  01/09/2023   Procedure: BRONCHIAL BIOPSIES;  Surgeon: Josephine Igo, DO;  Location: [facility]  ENDOSCOPY;  Service: Pulmonary;;   BRONCHIAL NEEDLE ASPIRATION BIOPSY  01/09/2023   Procedure: BRONCHIAL NEEDLE ASPIRATION BIOPSIES;  Surgeon: Josephine Igo, DO;  Location: [facility] ENDOSCOPY;  Service: Pulmonary;;   CATARACT EXTRACTION W/PHACO  12/27/2010   Procedure: CATARACT EXTRACTION PHACO AND INTRAOCULAR LENS PLACEMENT (IOC);  Surgeon: Gemma Payor;  Location: [facility] ORS;  Service: Ophthalmology;  Laterality: Right;   CATARACT EXTRACTION W/PHACO  03/28/2011   Procedure: CATARACT EXTRACTION PHACO AND INTRAOCULAR LENS PLACEMENT (IOC);  Surgeon: Gemma Payor;  Location: [facility] ORS;  Service: Ophthalmology;  Laterality: Left;  CDE 7.27   ESOPHAGOGASTRODUODENOSCOPY (EGD) WITH PROPOFOL N/A 12/09/2022   Procedure: ESOPHAGOGASTRODUODENOSCOPY (EGD) WITH PROPOFOL;  Surgeon: Imogene Burn, MD;  Location: [facility] ENDOSCOPY;  Service: Gastroenterology;  Laterality: N/A;    REVIEW OF SYSTEMS:   Review of Systems  Constitutional: Positive for stable fatigue and decreased appetite and weight loss.  Negative for chills and fever.  HENT: Negative for mouth sores, nosebleeds, sore throat and trouble swallowing.   Eyes: Negative for eye problems and icterus.  Respiratory: Positive for cough. Negative for hemoptysis, shortness of breath and wheezing.   Cardiovascular: Negative for chest pain and leg swelling.  Gastrointestinal: Positive for dark stools and intermittent nausea. Negative for abdominal pain, constipation, diarrhea, and vomiting.  Genitourinary: Negative for bladder incontinence, difficulty urinating, dysuria, frequency and hematuria.   Musculoskeletal: Negative for gait problem, neck pain and neck stiffness.  Skin: Negative for itching and rash.  Neurological: Negative for dizziness, extremity weakness, gait problem, headaches, light-headedness and seizures.  Hematological: Negative for adenopathy. Does not bruise/bleed easily.  Psychiatric/Behavioral: Negative for confusion, depression and sleep disturbance. The  patient is not nervous/anxious.    PHYSICAL EXAMINATION:  There were no vitals taken for this visit.  ECOG PERFORMANCE STATUS: 1  Physical Exam  Constitutional: Oriented to person, place, and time and thin appearing female and in no distress.  HENT:  Head: Normocephalic and atraumatic.  Mouth/Throat: Oropharynx is clear and moist. No oropharyngeal exudate.  Eyes: Conjunctivae are normal. Right eye exhibits no discharge. Left eye exhibits no discharge. No scleral icterus.  Neck: Normal range of motion. Neck supple.  Cardiovascular: Normal rate, regular rhythm, normal heart sounds and intact distal pulses.   Pulmonary/Chest: Effort normal and breath sounds normal. No respiratory distress. No wheezes. No rales.  Abdominal: Soft. Bowel sounds are normal. Exhibits no distension and no mass. There is no tenderness.  Musculoskeletal: Normal range of motion. Exhibits no edema.  Lymphadenopathy:    No cervical adenopathy.  Neurological: Alert and oriented to person, place, and time. Exhibits muscle wasting. Examined in the wheelchair.  Skin: Skin is warm and dry. No rash noted. Not diaphoretic. No erythema. No pallor.  Psychiatric: Mood, memory and judgment normal.  Vitals reviewed.  LABORATORY DATA: Lab Results  Component Value Date   WBC 10.3 02/01/2023   HGB 9.0 (L) 02/01/2023   HCT 28.7 (L) 02/01/2023   MCV 87.2 02/01/2023   PLT 501 (H) 02/01/2023      Chemistry      Component Value Date/Time   NA 138 02/01/2023 1401   K 3.8 02/01/2023 1401   CL 103 02/01/2023 1401   CO2 28 02/01/2023 1401   BUN 11 02/01/2023 1401   CREATININE 0.90 02/01/2023 1401      Component Value Date/Time   CALCIUM 9.3 02/01/2023 1401   ALKPHOS 72 02/01/2023 1401   AST 13 (L) 02/01/2023 1401   ALT 12 02/01/2023 1401   BILITOT 0.3 02/01/2023 1401       RADIOGRAPHIC STUDIES:  ECHOCARDIOGRAM LIMITED  Result Date: 02/15/2023    ECHOCARDIOGRAM LIMITED REPORT   Patient Name:   JARETSY LEDBETTER  Date of Exam: 02/15/2023 Medical Rec #:  [id number]        Height:       62.0 in Accession #:    [id number]       Weight:       127.2 lb Date of Birth:  Aug 05, 1950        BSA:          1.577 m Patient Age:    71 years         BP:           120/60 mmHg Patient Gender: F                HR:  69 bpm. Exam Location:  [facility] Procedure: Limited Echo, Cardiac Doppler, Limited Color Doppler and Intracardiac            Opacification Agent Indications:    I50.21 Acute systolic (congestive) heart failure  History:        Patient has prior history of Echocardiogram examinations, most                 recent 11/23/2022. Cardiomyopathy, Arrythmias:LBBB; Risk                 Factors:Hypertension. Lung cancer. Pulmonary emboli.  Sonographer:    Cathie Beams RCS Referring Phys: HAO MENG IMPRESSIONS  1. Left ventricular ejection fraction, by estimation, is 30 to 35%. The left ventricle has moderately decreased function. The left ventricle demonstrates regional wall motion abnormalities. Abnormal (paradoxical) septal motion, consistent with left bundle branch block. There is mild left ventricular hypertrophy. Left ventricular diastolic parameters are consistent with Grade II diastolic dysfunction (pseudonormalization). Elevated left atrial pressure.  2. Right ventricular systolic function is normal. The right ventricular size is normal. Tricuspid regurgitation signal is inadequate for assessing PA pressure.  3. Left atrial size was moderately dilated.  4. The mitral valve is normal in structure. Trivial mitral valve regurgitation.  5. The aortic valve is tricuspid. Aortic valve regurgitation is mild to moderate. Aortic valve sclerosis is present, with no evidence of aortic valve stenosis.  6. The inferior vena cava is normal in size with greater than 50% respiratory variability, suggesting right atrial pressure of 3 mmHg. FINDINGS  Left Ventricle: Left ventricular ejection fraction, by estimation, is 30 to 35%. The left  ventricle has moderately decreased function. The left ventricle demonstrates regional wall motion abnormalities. The left ventricular internal cavity size was normal in size. There is mild left ventricular hypertrophy. Abnormal (paradoxical) septal motion, consistent with left bundle branch block. Left ventricular diastolic parameters are consistent with Grade II diastolic dysfunction (pseudonormalization). Elevated left atrial pressure. Right Ventricle: The right ventricular size is normal. No increase in right ventricular wall thickness. Right ventricular systolic function is normal. Tricuspid regurgitation signal is inadequate for assessing PA pressure. Left Atrium: Left atrial size was moderately dilated. Pericardium: Trivial pericardial effusion is present. Mitral Valve: The mitral valve is normal in structure. Trivial mitral valve regurgitation. Aortic Valve: The aortic valve is tricuspid. Aortic valve regurgitation is mild to moderate. Aortic valve sclerosis is present, with no evidence of aortic valve stenosis. Pulmonic Valve: The pulmonic valve was not well visualized. Pulmonic valve regurgitation is not visualized. Aorta: The aortic root and ascending aorta are structurally normal, with no evidence of dilitation. Venous: The inferior vena cava is normal in size with greater than 50% respiratory variability, suggesting right atrial pressure of 3 mmHg. IAS/Shunts: The interatrial septum was not well visualized. LEFT VENTRICLE PLAX 2D LVIDd:         4.70 cm   Diastology LVIDs:         4.10 cm   LV e' medial:    5.00 cm/s LV PW:         1.10 cm   LV E/e' medial:  19.2 LV IVS:        1.00 cm   LV e' lateral:   7.29 cm/s LVOT diam:     2.00 cm   LV E/e' lateral: 13.2 LV SV:         61 LV SV Index:   39 LVOT Area:     3.14 cm  LEFT ATRIUM  Index LA diam:        3.20 cm 2.03 cm/m LA Vol (A2C):   81.3 ml 51.55 ml/m LA Vol (A4C):   56.5 ml 35.82 ml/m LA Biplane Vol: 68.0 ml 43.11 ml/m  AORTIC VALVE  LVOT Vmax:   96.50 cm/s LVOT Vmean:  61.500 cm/s LVOT VTI:    0.194 m  AORTA Ao Root diam: 3.00 cm Ao Asc diam:  3.10 cm MITRAL VALVE MV Area (PHT): 3.58 cm     SHUNTS MV Decel Time: 212 msec     Systemic VTI:  0.19 m MV E velocity: 95.90 cm/s   Systemic Diam: 2.00 cm MV A velocity: 130.00 cm/s MV E/A ratio:  0.74 Epifanio Lesches MD Electronically signed by Epifanio Lesches MD Signature Date/Time: 02/15/2023/2:00:59 PM    Final      ASSESSMENT/PLAN:  This is a very pleasant 72 year old female with stage IV (T1c, N0, M1) Non-Small Cell Lung Cancer, adenocarcinoma. She presented with a spiculated left upper lobe lung nodule and large right adrenal gland mass. She was diagnosed in July 2024. She had molecular studies that showed she has KRASG12C which can be used in the second line setting.  PDL1 1%  She underwent radiation to the adrenal lesion and the lung under the care of Dr. Mitzi Hansen. The last dose was on 02/20/23  The patient was seen by Dr. Arbutus Ped today.  Her hemoglobin is 5.5.  Unfortunately due to the patient's significant anemia, it be very challenging to treat her with systemic chemotherapy.  Given the severe anemia, we we will arrange for the patient to be seen in the emergency room for further evaluation and workup of her anemia as the patient has not started any systemic treatment at this time.  I have placed orders for 2 units of blood to be given today in the emergency room.  Recommend consideration of GI consultation.  The patient has never had a colonoscopy.  Wondering whether the patient should be on Protonix.  She is compliant with her iron supplement.  We will plan to see the patient back for follow-up visit in approximately 1 week for evaluation and to discuss her treatment options once she is more stable.  We will also arrange for IV iron infusions with Venofer 300 mg weekly x 3.  Will add weekly sample to blood bank should she need blood transfusions in the future.  The  patient was advised to call immediately if she has any concerning symptoms in the interval. The patient voices understanding of current disease status and treatment options and is in agreement with the current care plan. All questions were answered. The patient knows to call the clinic with any problems, questions or concerns. We can certainly see the patient much sooner if necessary .  No orders of the defined types were placed in this encounter.     Darrius Montano L Brittanie Dosanjh, [name] 02/21/23  ADDENDUM: Hematology/Oncology Attending:  I had a face-to-face encounter with the patient today.  I reviewed her records, lab and recommended her care plan.  This is a very pleasant 72 years old African-American female with stage IV non-small cell lung cancer diagnosed in July 2024 with molecular studies showed positive KRAS G12C mutation and PD-L1 expression of 1%.  The patient is status post SBRT to the left upper lobe lung mass in addition to right adrenal metastasis completed 02/20/2023. She has been complaining of persistent anemia that has been going on for several months secondary to gastrointestinal blood  loss.  She presented today for evaluation and discussion of her systemic treatment options but CBC showed hemoglobin of 5.5 and hematocrit 17.4%.  Patient did not have a lot of symptoms from her anemia. I recommended for her to go immediately to the emergency department for evaluation and to receive PRBCs transfusion and also have gastroenterology to evaluate her condition for the persistent gastrointestinal blood loss.  She has been on anticoagulation for pulmonary embolus.  Her last colonoscopy was more than 10 years ago. The patient was transferred to the emergency department for further evaluation and we will arrange for her a follow-up appointment with me after discharge for discussion of her systemic treatment options but she is not currently in great shape to receive systemic therapy especially  with her persistent anemia and gastrointestinal blood loss. She was advised to call if she has any other concerning symptoms. The total time spent in the appointment was 30 minutes. Disclaimer: This note was dictated with voice recognition software. Similar sounding words can inadvertently be transcribed and may be missed upon review. Lajuana Matte, MD

## 2023-02-22 NOTE — Radiation Completion Notes (Addendum)
Radiation Oncology         [phone]) [phone] ________________________________  Name: Erin Pacheco MRN: [id number]  Date of Service: 02/20/2023  DOB: 1950-12-19  End of Treatment Note  Diagnosis:    Stage IV, NSCLC, adenocarcinoma of the LUL with metastasis to the right adrenal gland.   Intent: Curative     ==========DELIVERED PLANS==========  First Treatment Date: 2023-02-07 - Last Treatment Date: 2023-02-20   Plan Name: Lung_Lt_SBRT Site: Lung, Left Technique: SBRT/SRT-IMRT Mode: Photon Dose Per Fraction: 12 Gy Prescribed Dose (Delivered / Prescribed): 60 Gy / 60 Gy Prescribed Fxs (Delivered / Prescribed): 5 / 5   Plan Name: Adrenal_R_UHR Site: Adrenal Gland, Right Technique: IMRT Mode: Photon Dose Per Fraction: 6 Gy Prescribed Dose (Delivered / Prescribed): 60 Gy / 60 Gy Prescribed Fxs (Delivered / Prescribed): 10 / 10     ==========ON TREATMENT VISIT DATES========== 2023-02-07, 2023-02-09, 2023-02-10, 2023-02-13, 2023-02-15, 2023-02-17, 2023-02-17   See weekly On Treatment Notes in Epic for details. The patient tolerated radiation. She had difficulty with poor appetite during treatment but no nausea from therapy was noted at the completion of her treatment.  The patient will receive a call in about one month from the radiation oncology department. She will continue follow up with Dr. Arbutus Ped as well.      Osker Mason, PAC

## 2023-02-27 ENCOUNTER — Inpatient Hospital Stay: Payer: Medicare HMO

## 2023-02-27 ENCOUNTER — Inpatient Hospital Stay (HOSPITAL_COMMUNITY)
Admission: EM | Admit: 2023-02-27 | Discharge: 2023-03-01 | DRG: 378 | Disposition: A | Discharge status: Home / Self Care | Payer: Medicare HMO | Attending: Internal Medicine | Admitting: Internal Medicine

## 2023-02-27 ENCOUNTER — Other Ambulatory Visit: Payer: Self-pay

## 2023-02-27 ENCOUNTER — Telehealth: Payer: Self-pay

## 2023-02-27 ENCOUNTER — Telehealth: Payer: Self-pay | Admitting: Medical Oncology

## 2023-02-27 ENCOUNTER — Inpatient Hospital Stay: Payer: Medicare HMO | Admitting: Physician Assistant

## 2023-02-27 VITALS — BP 123/61 | HR 78 | Temp 98.0°F | Resp 14

## 2023-02-27 DIAGNOSIS — C3412 Malignant neoplasm of upper lobe, left bronchus or lung: Secondary | ICD-10-CM | POA: Insufficient documentation

## 2023-02-27 DIAGNOSIS — Z7901 Long term (current) use of anticoagulants: Secondary | ICD-10-CM | POA: Insufficient documentation

## 2023-02-27 DIAGNOSIS — C7970 Secondary malignant neoplasm of unspecified adrenal gland: Secondary | ICD-10-CM

## 2023-02-27 DIAGNOSIS — C7971 Secondary malignant neoplasm of right adrenal gland: Secondary | ICD-10-CM | POA: Diagnosis present

## 2023-02-27 DIAGNOSIS — I10 Essential (primary) hypertension: Secondary | ICD-10-CM | POA: Diagnosis present

## 2023-02-27 DIAGNOSIS — Z79899 Other long term (current) drug therapy: Secondary | ICD-10-CM

## 2023-02-27 DIAGNOSIS — K31819 Angiodysplasia of stomach and duodenum without bleeding: Secondary | ICD-10-CM | POA: Diagnosis not present

## 2023-02-27 DIAGNOSIS — D649 Anemia, unspecified: Secondary | ICD-10-CM | POA: Diagnosis not present

## 2023-02-27 DIAGNOSIS — I5022 Chronic systolic (congestive) heart failure: Secondary | ICD-10-CM | POA: Diagnosis present

## 2023-02-27 DIAGNOSIS — R918 Other nonspecific abnormal finding of lung field: Secondary | ICD-10-CM

## 2023-02-27 DIAGNOSIS — K922 Gastrointestinal hemorrhage, unspecified: Secondary | ICD-10-CM | POA: Diagnosis not present

## 2023-02-27 DIAGNOSIS — K31811 Angiodysplasia of stomach and duodenum with bleeding: Principal | ICD-10-CM

## 2023-02-27 DIAGNOSIS — Z86711 Personal history of pulmonary embolism: Secondary | ICD-10-CM

## 2023-02-27 DIAGNOSIS — R634 Abnormal weight loss: Secondary | ICD-10-CM | POA: Diagnosis present

## 2023-02-27 DIAGNOSIS — E869 Volume depletion, unspecified: Secondary | ICD-10-CM | POA: Diagnosis present

## 2023-02-27 DIAGNOSIS — D500 Iron deficiency anemia secondary to blood loss (chronic): Secondary | ICD-10-CM | POA: Diagnosis not present

## 2023-02-27 DIAGNOSIS — J439 Emphysema, unspecified: Secondary | ICD-10-CM | POA: Diagnosis present

## 2023-02-27 DIAGNOSIS — I5020 Unspecified systolic (congestive) heart failure: Secondary | ICD-10-CM | POA: Diagnosis present

## 2023-02-27 DIAGNOSIS — E440 Moderate protein-calorie malnutrition: Secondary | ICD-10-CM | POA: Diagnosis present

## 2023-02-27 DIAGNOSIS — I110 Hypertensive heart disease with heart failure: Secondary | ICD-10-CM | POA: Diagnosis present

## 2023-02-27 DIAGNOSIS — Z87891 Personal history of nicotine dependence: Secondary | ICD-10-CM

## 2023-02-27 DIAGNOSIS — Z90710 Acquired absence of both cervix and uterus: Secondary | ICD-10-CM

## 2023-02-27 DIAGNOSIS — Z6821 Body mass index (BMI) 21.0-21.9, adult: Secondary | ICD-10-CM

## 2023-02-27 DIAGNOSIS — D62 Acute posthemorrhagic anemia: Secondary | ICD-10-CM | POA: Diagnosis present

## 2023-02-27 LAB — CBC
HCT: 17.7 % — ABNORMAL LOW (ref 36.0–46.0)
Hemoglobin: 5.4 g/dL — CL (ref 12.0–15.0)
MCH: 26.9 pg (ref 26.0–34.0)
MCHC: 30.5 g/dL (ref 30.0–36.0)
MCV: 88.1 fL (ref 80.0–100.0)
Platelets: 376 10*3/uL (ref 150–400)
RBC: 2.01 MIL/uL — ABNORMAL LOW (ref 3.87–5.11)
RDW: 19.8 % — ABNORMAL HIGH (ref 11.5–15.5)
WBC: 7.6 10*3/uL (ref 4.0–10.5)
nRBC: 0.4 % — ABNORMAL HIGH (ref 0.0–0.2)

## 2023-02-27 LAB — CBC WITH DIFFERENTIAL (CANCER CENTER ONLY)
Abs Immature Granulocytes: 0.07 10*3/uL (ref 0.00–0.07)
Basophils Absolute: 0.1 10*3/uL (ref 0.0–0.1)
Basophils Relative: 1 %
Eosinophils Absolute: 0.1 10*3/uL (ref 0.0–0.5)
Eosinophils Relative: 1 %
HCT: 17.4 % — ABNORMAL LOW (ref 36.0–46.0)
Hemoglobin: 5.5 g/dL — CL (ref 12.0–15.0)
Immature Granulocytes: 1 %
Lymphocytes Relative: 5 %
Lymphs Abs: 0.4 10*3/uL — ABNORMAL LOW (ref 0.7–4.0)
MCH: 27.5 pg (ref 26.0–34.0)
MCHC: 31.6 g/dL (ref 30.0–36.0)
MCV: 87 fL (ref 80.0–100.0)
Monocytes Absolute: 0.9 10*3/uL (ref 0.1–1.0)
Monocytes Relative: 12 %
Neutro Abs: 6.2 10*3/uL (ref 1.7–7.7)
Neutrophils Relative %: 80 %
Platelet Count: 332 10*3/uL (ref 150–400)
RBC: 2 MIL/uL — ABNORMAL LOW (ref 3.87–5.11)
RDW: 19.5 % — ABNORMAL HIGH (ref 11.5–15.5)
WBC Count: 7.6 10*3/uL (ref 4.0–10.5)
nRBC: 0.7 % — ABNORMAL HIGH (ref 0.0–0.2)

## 2023-02-27 LAB — PREPARE RBC (CROSSMATCH)

## 2023-02-27 LAB — BASIC METABOLIC PANEL
Anion gap: 10 (ref 5–15)
BUN: 12 mg/dL (ref 8–23)
CO2: 23 mmol/L (ref 22–32)
Calcium: 8.7 mg/dL — ABNORMAL LOW (ref 8.9–10.3)
Chloride: 102 mmol/L (ref 98–111)
Creatinine, Ser: 0.72 mg/dL (ref 0.44–1.00)
GFR, Estimated: >60 mL/min (ref >60)
Glucose, Bld: 89 mg/dL (ref 70–99)
Potassium: 4.4 mmol/L (ref 3.5–5.1)
Sodium: 135 mmol/L (ref 135–145)

## 2023-02-27 LAB — CMP (CANCER CENTER ONLY)
ALT: 8 U/L (ref 0–44)
AST: 11 U/L — ABNORMAL LOW (ref 15–41)
Albumin: 2.9 g/dL — ABNORMAL LOW (ref 3.5–5.0)
Alkaline Phosphatase: 59 U/L (ref 38–126)
Anion gap: 7 (ref 5–15)
BUN: 11 mg/dL (ref 8–23)
CO2: 26 mmol/L (ref 22–32)
Calcium: 8.6 mg/dL — ABNORMAL LOW (ref 8.9–10.3)
Chloride: 103 mmol/L (ref 98–111)
Creatinine: 0.87 mg/dL (ref 0.44–1.00)
GFR, Estimated: >60 mL/min (ref >60)
Glucose, Bld: 119 mg/dL — ABNORMAL HIGH (ref 70–99)
Potassium: 3.9 mmol/L (ref 3.5–5.1)
Sodium: 136 mmol/L (ref 135–145)
Total Bilirubin: 0.3 mg/dL (ref 0.3–1.2)
Total Protein: 7.5 g/dL (ref 6.5–8.1)

## 2023-02-27 LAB — IRON AND IRON BINDING CAPACITY (CC-WL,HP ONLY)
Iron: 19 ug/dL — ABNORMAL LOW (ref 28–170)
Saturation Ratios: 10 % — ABNORMAL LOW (ref 10.4–31.8)
TIBC: 189 ug/dL — ABNORMAL LOW (ref 250–450)
UIBC: 170 ug/dL (ref 148–442)

## 2023-02-27 LAB — POC OCCULT BLOOD, ED: Fecal Occult Bld: POSITIVE — AB

## 2023-02-27 LAB — FERRITIN: Ferritin: 423 ng/mL — ABNORMAL HIGH (ref 11–307)

## 2023-02-27 LAB — SAMPLE TO BLOOD BANK

## 2023-02-27 MED ORDER — ACETAMINOPHEN 325 MG PO TABS: 650.0000 mg | ORAL_TABLET | Freq: Four times a day (QID) | ORAL | Status: DC | PRN

## 2023-02-27 MED ORDER — ONDANSETRON HCL 4 MG PO TABS: 4.0000 mg | ORAL_TABLET | Freq: Four times a day (QID) | ORAL | Status: DC | PRN

## 2023-02-27 MED ORDER — PANTOPRAZOLE SODIUM 40 MG IV SOLR
40.0000 mg | Freq: Two times a day (BID) | INTRAVENOUS | Status: DC
  Administered 2023-02-27 – 2023-02-28 (×3): 40 mg via INTRAVENOUS
  Filled 2023-02-27 (×3): qty 10

## 2023-02-27 MED ORDER — METOPROLOL SUCCINATE ER 50 MG PO TB24
50.0000 mg | ORAL_TABLET | Freq: Every day | ORAL | Status: DC
  Administered 2023-02-27 – 2023-03-01 (×3): 50 mg via ORAL
  Filled 2023-02-27 (×3): qty 1

## 2023-02-27 MED ORDER — SODIUM CHLORIDE 0.9% IV SOLUTION: Freq: Once | INTRAVENOUS | Status: DC

## 2023-02-27 MED ORDER — FLUTICASONE FUROATE-VILANTEROL 100-25 MCG/ACT IN AEPB
1.0000 | INHALATION_SPRAY | Freq: Every day | RESPIRATORY_TRACT | Status: DC
  Filled 2023-02-27: qty 28

## 2023-02-27 MED ORDER — BOOST / RESOURCE BREEZE PO LIQD CUSTOM
1.0000 | Freq: Three times a day (TID) | ORAL | Status: DC
  Administered 2023-02-27: 1 via ORAL

## 2023-02-27 MED ORDER — ORAL CARE MOUTH RINSE: 15.0000 mL | OROMUCOSAL | Status: DC | PRN

## 2023-02-27 MED ORDER — SPIRONOLACTONE 12.5 MG HALF TABLET
12.5000 mg | ORAL_TABLET | Freq: Every day | ORAL | Status: DC
  Administered 2023-02-27 – 2023-03-01 (×3): 12.5 mg via ORAL
  Filled 2023-02-27 (×4): qty 1

## 2023-02-27 MED ORDER — LOSARTAN POTASSIUM 50 MG PO TABS
50.0000 mg | ORAL_TABLET | Freq: Every day | ORAL | Status: DC
  Administered 2023-02-27 – 2023-03-01 (×3): 50 mg via ORAL
  Filled 2023-02-27 (×3): qty 1

## 2023-02-27 MED ORDER — ONDANSETRON HCL 4 MG/2ML IJ SOLN: 4.0000 mg | Freq: Four times a day (QID) | INTRAMUSCULAR | Status: DC | PRN

## 2023-02-27 MED ORDER — UMECLIDINIUM BROMIDE 62.5 MCG/ACT IN AEPB
1.0000 | INHALATION_SPRAY | Freq: Every day | RESPIRATORY_TRACT | Status: DC
  Filled 2023-02-27: qty 7

## 2023-02-27 MED ORDER — ACETAMINOPHEN 650 MG RE SUPP: 650.0000 mg | Freq: Four times a day (QID) | RECTAL | Status: DC | PRN

## 2023-02-27 NOTE — Telephone Encounter (Signed)
Report given to Charge that pt's Hgb is 5.5 and needs two units of blood and a GI consult.  Anemia is not chemo related and needs to be evaluated for the cause of severe anemia and possible blood loss.

## 2023-02-27 NOTE — Plan of Care (Signed)
Problem: Clinical Measurements: Goal: Respiratory complications will improve Outcome: Progressing   Problem: Nutrition: Goal: Adequate nutrition will be maintained Outcome: Progressing   Problem: Elimination: Goal: Will not experience complications related to bowel motility Outcome: Progressing

## 2023-02-27 NOTE — Telephone Encounter (Signed)
please tell them she has not started any treatment so this is not just chemo anemia. see if they can consider consulting GI

## 2023-02-27 NOTE — ED Provider Notes (Signed)
[place] EMERGENCY DEPARTMENT AT [facility] Provider Note   CSN: [id number] Arrival date & time: 02/27/23  2725     History  Chief Complaint  Patient presents with   low hemoglobin    Erin Pacheco is a 72 y.o. female presenting to the ED with concern for symptomatic anemia.  The patient had blood work performed at the cancer center today and was told to come into the ED because her hemoglobin is 5.5.  She reports that she is on Eliquis for PE as well as iron supplements and her stool is always dark.  She reports she feels fatigued, low energy.  External records reviewed.  Last upper endoscopy in July 2024 which showed 6 with nonbleeding angiectasia's of the duodenum and gastritis findings  HPI     Home Medications Prior to Admission medications   Medication Sig Start Date End Date Taking? Authorizing Provider  acetaminophen (TYLENOL) 325 MG tablet Take 2 tablets (650 mg total) by mouth every 6 (six) hours as needed for mild pain (or Fever >/= 101). 11/26/22   [name], MD  apixaban (ELIQUIS) 5 MG TABS tablet Take 1 tablet (5 mg total) by mouth 2 (two) times daily. 11/26/22   [name], MD  Camphor-Menthol-Methyl Sal (SALONPAS) 3.06-11-08 % PTCH Place 1 patch onto the skin daily as needed (pain).    [provider]  dexamethasone (DECADRON) 4 MG tablet Take 4 mg by mouth daily as needed (For arthritis pain).    [provider]  enoxaparin (LOVENOX) 60 MG/0.6ML injection Inject 0.6 mLs (60 mg total) into the skin every 12 (twelve) hours for 2 days. Hold Lovenox 12 hours before EGD. 12/07/22 12/09/22  Georga Kaufmann T, [name]  fluticasone furoate-vilanterol (BREO ELLIPTA) 100-25 MCG/ACT AEPB Inhale 1 puff into the lungs daily. 11/27/22   [name], MD  Fluticasone-Umeclidin-Vilant (TRELEGY ELLIPTA) 100-62.5-25 MCG/ACT AEPB Inhale 1 each into the lungs daily. 01/17/23   Bevelyn Ngo, NP  losartan (COZAAR) 50 MG tablet Take 1 tablet (50 mg total) by  mouth daily. 12/21/22 03/21/23  Azalee Course, PA  metoprolol succinate (TOPROL-XL) 50 MG 24 hr tablet Take 1 tablet (50 mg total) by mouth daily. Take with or immediately following a meal. 12/21/22   Azalee Course, PA  Multiple Vitamin (MULTIVITAMIN) tablet Take 1 tablet by mouth daily.      [provider]  ondansetron (ZOFRAN) 8 MG tablet Take 1 tablet (8 mg total) by mouth every 8 (eight) hours as needed for nausea or vomiting. 02/09/23   Ronny Bacon, [name]  oxyCODONE (OXY IR/ROXICODONE) 5 MG immediate release tablet Take 1 tablet (5 mg total) by mouth every 6 (six) hours as needed for moderate pain. 12/22/22   Jaci Standard, MD  Polyethyl Glycol-Propyl Glycol (SYSTANE OP) Place 1 drop into both eyes daily as needed (dry eye).    [provider]  spironolactone (ALDACTONE) 25 MG tablet Take 0.5 tablets (12.5 mg total) by mouth daily. 12/21/22   Azalee Course, PA      Allergies    Patient has no known allergies.    Review of Systems   Review of Systems  Physical Exam Updated Vital Signs BP 132/76 (BP Location: Left Arm)   Pulse 80   Temp 98 F (36.7 C) (Oral)   Resp 14   Ht 5\' 3"  (1.6 m)   Wt 54 kg   SpO2 100%   BMI 21.08 kg/m  Physical Exam Constitutional:  General: She is not in acute distress. HENT:     Head: Normocephalic and atraumatic.  Eyes:     Conjunctiva/sclera: Conjunctivae normal.     Pupils: Pupils are equal, round, and reactive to light.  Cardiovascular:     Rate and Rhythm: Normal rate and regular rhythm.  Pulmonary:     Effort: Pulmonary effort is normal. No respiratory distress.  Abdominal:     General: There is no distension.     Tenderness: There is no abdominal tenderness.  Skin:    General: Skin is warm and dry.  Neurological:     General: No focal deficit present.     Mental Status: She is alert and oriented to person, place, and time. Mental status is at baseline.  Psychiatric:        Mood and Affect: Mood normal.         Behavior: Behavior normal.     ED Results / Procedures / Treatments   Labs (all labs ordered are listed, but only abnormal results are displayed) Labs Reviewed  BASIC METABOLIC PANEL - Abnormal; Notable for the following components:      Result Value   Calcium 8.7 (*)    All other components within normal limits  CBC - Abnormal; Notable for the following components:   RBC 2.01 (*)    Hemoglobin 5.4 (*)    HCT 17.7 (*)    RDW 19.8 (*)    nRBC 0.4 (*)    All other components within normal limits  POC OCCULT BLOOD, ED - Abnormal; Notable for the following components:   Fecal Occult Bld POSITIVE (*)    All other components within normal limits  TYPE AND SCREEN  PREPARE RBC (CROSSMATCH)    EKG None  Radiology No results found.  Procedures .Critical Care  Performed by: Terald Sleeper, MD Authorized by: Terald Sleeper, MD   Critical care provider statement:    Critical care time (minutes):  40   Critical care time was exclusive of:  Separately billable procedures and treating other patients   Critical care was necessary to treat or prevent imminent or life-threatening deterioration of the following conditions:  Circulatory failure   Critical care was time spent personally by me on the following activities:  Ordering and performing treatments and interventions, ordering and review of laboratory studies, ordering and review of radiographic studies, pulse oximetry, review of old charts, examination of patient and evaluation of patient's response to treatment   Care discussed with: admitting provider   Comments:     Symptomatic anemia, transfusion     Medications Ordered in ED Medications  0.9 %  sodium chloride infusion (Manually program via Guardrails IV Fluids) (has no administration in time range)  pantoprazole (PROTONIX) injection 40 mg (40 mg Intravenous Given 02/27/23 1230)    ED Course/ Medical Decision Making/ A&P Clinical Course as of 02/27/23 1235  Mon Feb 27, 2023  1152 Admitted to hospitalist Dr Robb Matar [MT]  1233 [facility] consulted [MT]    Clinical Course User Index [MT] Indianna Boran, Kermit Balo, MD                                 Medical Decision Making Amount and/or Complexity of Data Reviewed Labs: ordered.  Risk Prescription drug management. Decision regarding hospitalization.   This patient presents to the ED with concern for low hgb, fatigue. This involves an extensive number of treatment  options, and is a complaint that carries with it a high risk of complications and morbidity.  The differential diagnosis includes symptomatic anemia, likely GI bleed, vs other  Co-morbidities that complicate the patient evaluation: hx of PE on eliquis, higher bleeding risk  Additional history obtained from daughter at bedside  External records from outside source obtained and reviewed including EGD July 2024 as noted above, gastritis findings and nonbleeding angioectasias of duodenum noted  I ordered and personally interpreted labs.  The pertinent results include:  hgb 5.4   The patient was maintained on a cardiac monitor.  I personally viewed and interpreted the cardiac monitored which showed an underlying rhythm of: NSR  I ordered medication including 2 units of red blood cells transfusion for symptomatic anemia  I have reviewed the patients home medicines and have made adjustments as needed.  Holding Eliquis for now.  Test Considered: Emergent indication for CT imaging of the abdomen at this time.  I requested consultation with the gastroenterology,  and discussed lab and imaging findings as well as pertinent plan - they recommend: Consult pending on admission  After the interventions noted above, I reevaluated the patient and found that they have: stayed the same   Dispostion:  After consideration of the diagnostic results and the patients response to treatment, I feel that the patent would benefit from medical admission and GI  consultation for potential upper GI bleed.  Protonix ordered in discussion with hospitalist.  Patient consented for blood transfusion.  Patient and family in agreement.  She is stable at the time of admission with normal hemodynamics.         Final Clinical Impression(s) / ED Diagnoses Final diagnoses:  Gastrointestinal hemorrhage, unspecified gastrointestinal hemorrhage type  Symptomatic anemia    Rx / DC Orders ED Discharge Orders     None         Terald Sleeper, MD 02/27/23 1236

## 2023-02-27 NOTE — Anesthesia Preprocedure Evaluation (Signed)
Anesthesia Evaluation  Patient identified by MRN, date of birth, ID band Patient awake    Reviewed: Allergy & Precautions, NPO status , Patient's Chart, lab work & pertinent test results  History of Anesthesia Complications Negative for: history of anesthetic complications  Airway Mallampati: I  TM Distance: >3 FB Neck ROM: Full    Dental no notable dental hx. (+) Upper Dentures, Lower Dentures   Pulmonary COPD,  COPD inhaler, former smoker, PE CTA showed LUL spiculated lung lesion and segmental PE, as well as possible adrenal mets.   Pulmonary exam normal breath sounds clear to auscultation       Cardiovascular hypertension, Pt. on medications and Pt. on home beta blockers Normal cardiovascular exam Rhythm:Regular Rate:Normal  6.2024 ECHO: EF 30-35%, mod decreased LVF with global hypokinesis, normal RVF, small pericardial effusion, mild-mod AI   Neuro/Psych    GI/Hepatic   Endo/Other  Adrenal cancer  Renal/GU      Musculoskeletal   Abdominal   Peds  Hematology  (+) Blood dyscrasia, anemia eliquis   Anesthesia Other Findings   Reproductive/Obstetrics                             Anesthesia Physical Anesthesia Plan  ASA: 4  Anesthesia Plan: MAC   Post-op Pain Management: Minimal or no pain anticipated   Induction: Intravenous  PONV Risk Score and Plan: 3 and Treatment may vary due to age or medical condition and Propofol infusion  Airway Management Planned: Natural Airway and Nasal Cannula  Additional Equipment: None  Intra-op Plan:   Post-operative Plan:   Informed Consent: I have reviewed the patients History and Physical, chart, labs and discussed the procedure including the risks, benefits and alternatives for the proposed anesthesia with the patient or authorized representative who has indicated his/her understanding and acceptance.     Dental advisory given  Plan  Discussed with: CRNA  Anesthesia Plan Comments: (Enteroscopy for anemia)       Anesthesia Quick Evaluation

## 2023-02-27 NOTE — H&P (Signed)
History and Physical    Patient: Erin Pacheco GMW:[id number] DOB: 1951/01/07 DOA: 02/27/2023 DOS: the patient was seen and examined on 02/27/2023 PCP: [name], [name]  Patient coming from: Home  Chief Complaint:  Chief Complaint  Patient presents with   low hemoglobin   HPI: Erin Pacheco is a 72 y.o. female with medical history significant of osteoarthritis, right renal cancer, cardiomyopathy, COPD, hypertension, pulmonary embolism, tobacco abuse, chronic systolic heart failure, no non-small cell carcinoma of the left lung, iron deficiency anemia due to chronic blood loss who was sent from the cancer center due to a hemoglobin level of 5.6 g/dL and episodes of melena since last week.  She has felt tired but denied chest pain, palpitations, diaphoresis, PND, orthopnea or recent pitting edema of the lower extremities.  No ever, chills, rhinorrhea, sore throat, wheezing or hemoptysis.  No   No abdominal pain, nausea, emesis, diarrhea or hematochezia.  No flank pain, dysuria, frequency or hematuria.  No polyuria, polydipsia, polyphagia or blurred vision.   Lab work: CBC 0 white count 7.6, hemoglobin 5.4 g deciliter platelets 376.  Fecal occult blood was positive.  BMP was normal after calcium correction.  Morning lab work at the cancer center showing LFTs with albumin of 2.9 g/dL, but was otherwise unremarkable.  ED course: Initial vital signs were temperature 98 F, pulse 80, respiration 14, BP 132/76 O2 sat 100% on room air.  GI was consulted per oncology request send a 2 unit PRBC transfusion was given.   Review of Systems: As mentioned in the history of present illness. All other systems reviewed and are negative. 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   Cardiomyopathy (HCC)    COPD (chronic obstructive pulmonary disease) (HCC)    Hypertension    Pulmonary embolism (HCC) 11/16/2022   Tobacco abuse    Past  Surgical History:  Procedure Laterality Date   ABDOMINAL HYSTERECTOMY     BILATERAL OOPHORECTOMY     BIOPSY  12/09/2022   Procedure: BIOPSY;  Surgeon: Imogene Burn, MD;  Location: [facility] ENDOSCOPY;  Service: Gastroenterology;;   BRONCHIAL BIOPSY  01/09/2023   Procedure: BRONCHIAL BIOPSIES;  Surgeon: Josephine Igo, DO;  Location: [facility] ENDOSCOPY;  Service: Pulmonary;;   BRONCHIAL NEEDLE ASPIRATION BIOPSY  01/09/2023   Procedure: BRONCHIAL NEEDLE ASPIRATION BIOPSIES;  Surgeon: Josephine Igo, DO;  Location: [facility] ENDOSCOPY;  Service: Pulmonary;;   CATARACT EXTRACTION W/PHACO  12/27/2010   Procedure: CATARACT EXTRACTION PHACO AND INTRAOCULAR LENS PLACEMENT (IOC);  Surgeon: Gemma Payor;  Location: [facility] ORS;  Service: Ophthalmology;  Laterality: Right;   CATARACT EXTRACTION W/PHACO  03/28/2011   Procedure: CATARACT EXTRACTION PHACO AND INTRAOCULAR LENS PLACEMENT (IOC);  Surgeon: Gemma Payor;  Location: [facility] ORS;  Service: Ophthalmology;  Laterality: Left;  CDE 7.27   ESOPHAGOGASTRODUODENOSCOPY (EGD) WITH PROPOFOL N/A 12/09/2022   Procedure: ESOPHAGOGASTRODUODENOSCOPY (EGD) WITH PROPOFOL;  Surgeon: Imogene Burn, MD;  Location: [facility] ENDOSCOPY;  Service: Gastroenterology;  Laterality: N/A;   Social History:  reports that she quit smoking about 3 months ago. Her smoking use included cigarettes. She started smoking about 30 years ago. She has a 15 pack-year smoking history. She has never used smokeless tobacco. She reports that she does not drink alcohol and does not use drugs.  No Known Allergies  Family History  Problem Relation Age of Onset   Ovarian cancer Mother 69   High blood pressure  Mother    Throat cancer Brother        smoker   Liver disease Neg Hx    Esophageal cancer Neg Hx    Colon cancer Neg Hx     Prior to Admission medications   Medication Sig Start Date End Date Taking? Authorizing Provider  acetaminophen (TYLENOL) 325 MG tablet Take 2 tablets (650 mg total) by mouth every 6 (six) hours as  needed for mild pain (or Fever >/= 101). 11/26/22   [name], MD  apixaban (ELIQUIS) 5 MG TABS tablet Take 1 tablet (5 mg total) by mouth 2 (two) times daily. 11/26/22   [name], MD  Camphor-Menthol-Methyl Sal (SALONPAS) 3.06-11-08 % PTCH Place 1 patch onto the skin daily as needed (pain).    [provider]  dexamethasone (DECADRON) 4 MG tablet Take 4 mg by mouth daily as needed (For arthritis pain).    [provider]  enoxaparin (LOVENOX) 60 MG/0.6ML injection Inject 0.6 mLs (60 mg total) into the skin every 12 (twelve) hours for 2 days. Hold Lovenox 12 hours before EGD. 12/07/22 12/09/22  Georga Kaufmann T, [name]  fluticasone furoate-vilanterol (BREO ELLIPTA) 100-25 MCG/ACT AEPB Inhale 1 puff into the lungs daily. 11/27/22   [name], MD  Fluticasone-Umeclidin-Vilant (TRELEGY ELLIPTA) 100-62.5-25 MCG/ACT AEPB Inhale 1 each into the lungs daily. 01/17/23   Bevelyn Ngo, NP  losartan (COZAAR) 50 MG tablet Take 1 tablet (50 mg total) by mouth daily. 12/21/22 03/21/23  Azalee Course, PA  metoprolol succinate (TOPROL-XL) 50 MG 24 hr tablet Take 1 tablet (50 mg total) by mouth daily. Take with or immediately following a meal. 12/21/22   Azalee Course, PA  Multiple Vitamin (MULTIVITAMIN) tablet Take 1 tablet by mouth daily.      [provider]  ondansetron (ZOFRAN) 8 MG tablet Take 1 tablet (8 mg total) by mouth every 8 (eight) hours as needed for nausea or vomiting. 02/09/23   Ronny Bacon, [name]  oxyCODONE (OXY IR/ROXICODONE) 5 MG immediate release tablet Take 1 tablet (5 mg total) by mouth every 6 (six) hours as needed for moderate pain. 12/22/22   Jaci Standard, MD  Polyethyl Glycol-Propyl Glycol (SYSTANE OP) Place 1 drop into both eyes daily as needed (dry eye).    [provider]  spironolactone (ALDACTONE) 25 MG tablet Take 0.5 tablets (12.5 mg total) by mouth daily. 12/21/22   Azalee Course, PA    Physical Exam: Vitals:   02/27/23 0844  BP: 132/76  Pulse:  80  Resp: 14  Temp: 98 F (36.7 C)  TempSrc: Oral  SpO2: 100%  Weight: 54 kg  Height: 5\' 3"  (1.6 m)   Physical Exam Vitals and nursing note reviewed.  Constitutional:      General: She is awake. She is not in acute distress.    Appearance: Normal appearance. She is ill-appearing.  HENT:     Head: Normocephalic.     Nose: No rhinorrhea.     Mouth/Throat:     Mouth: Mucous membranes are moist.  Eyes:     General: No scleral icterus.    Pupils: Pupils are equal, round, and reactive to light.  Neck:     Vascular: No JVD.  Cardiovascular:     Rate and Rhythm: Normal rate and regular rhythm.     Heart sounds: S1 normal and S2 normal.  Pulmonary:     Effort: Pulmonary effort is normal.     Breath sounds: Normal breath  sounds. No wheezing, rhonchi or rales.  Abdominal:     General: Bowel sounds are normal.     Palpations: Abdomen is soft.  Musculoskeletal:     Cervical back: Neck supple.     Right lower leg: No edema.     Left lower leg: No edema.  Skin:    General: Skin is warm and dry.     Coloration: Skin is pale.  Neurological:     General: No focal deficit present.     Mental Status: She is alert and oriented to person, place, and time.  Psychiatric:        Mood and Affect: Mood normal.        Behavior: Behavior normal. Behavior is cooperative.     Results are pending, will review when available.  02/15/2023 TTE. IMPRESSIONS:    1. Left ventricular ejection fraction, by estimation, is 30 to 35%. The  left ventricle has moderately decreased function. The left ventricle  demonstrates regional wall motion abnormalities. Abnormal (paradoxical)  septal motion, consistent with left  bundle branch block. There is mild left ventricular hypertrophy. Left  ventricular diastolic parameters are consistent with Grade II diastolic  dysfunction (pseudonormalization). Elevated left atrial pressure.   2. Right ventricular systolic function is normal. The right ventricular   size is normal. Tricuspid regurgitation signal is inadequate for assessing  PA pressure.   3. Left atrial size was moderately dilated.   4. The mitral valve is normal in structure. Trivial mitral valve  regurgitation.   5. The aortic valve is tricuspid. Aortic valve regurgitation is mild to  moderate. Aortic valve sclerosis is present, with no evidence of aortic  valve stenosis.   6. The inferior vena cava is normal in size with greater than 50%  respiratory variability, suggesting right atrial pressure of 3 mmHg.   Assessment and Plan: Principal Problem:   Symptomatic iron deficiency anemia due to chronic blood loss Observation/PCU. Clear liquid diet. N.p.o. after midnight. Continue PRBC transfusion. Continue pantoprazole 40 mg twice daily. Monitor H&H. Transfuse as needed. GI consult greatly appreciated.  Active Problems:   HFrEF (heart failure with reduced ejection fraction) /EF 30 to 35 % Seems to be volume depleted at the moment. Continue metoprolol XL 50 mg p.o. daily. Continue spironolactone 12.5 mg p.o. daily. Continue losartan 50 mg p.o. daily.    HTN (hypertension) Continue antihypertensives as above.    Malignant neoplasm of upper lobe of left lung (HCC) Follow-up at the cancer center as scheduled.    Moderate protein malnutrition (HCC) In the setting of anemia/cancer.   May benefit from protein supplementation. Monitor albumin level periodically.    Advance Care Planning:   Code Status: Full Code   Consults: [facility] Stan Head, MD)  Family Communication: Her daughter [name] was at bedside.  Severity of Illness: The appropriate patient status for this patient is OBSERVATION. Observation status is judged to be reasonable and necessary in order to provide the required intensity of service to ensure the patient's safety. The patient's presenting symptoms, physical exam findings, and initial radiographic and laboratory data in the context of their  medical condition is felt to place them at decreased risk for further clinical deterioration. Furthermore, it is anticipated that the patient will be medically stable for discharge from the hospital within 2 midnights of admission.   Author: Bobette Mo, MD 02/27/2023 11:52 AM  For on call review [email or web address].   This document was prepared using Conservation officer, historic buildings and may contain  some unintended transcription errors.

## 2023-02-27 NOTE — ED Triage Notes (Signed)
Pt arrives from cancer center, hgb 5.5, Dr Arbutus Ped wants her to get 2 units. Has not started any treatment yet, md requesting GI consult. Difficulty ambulating.

## 2023-02-27 NOTE — H&P (View-Only) (Signed)
Consultation  Primary Care Physician:  [name], [name] Primary Gastroenterologist:  Dr. Leonides Schanz       Reason for Consultation: acute on chronic anemia DOA: 02/27/2023         Hospital Day: 1         HPI:   Erin Pacheco is a 72 y.o. female with past medical history significant for right adrenal mass with adenocarcinoma, LUL mass, HFrEF (EF 30-35%), pulmonary embolism on Eliquis, COPD, and IDA presents with low Hgb 5.5.  Patient was recently hospitalized 6/12-6/22/24 for abdominal pain and cough, found to have a PE. On imaging she was also noted to have a LUL lung mass and a right adrenal mass. Radiology performed a CT guided biopsy of the right adrenal mass 6/19 that showed adenocarcinoma, favored to be of upper GI versus pancreaticobiliary origin.  On 12/09/22 Dr. Leonides Schanz performed EGD that was negative for malignancy, but did show non-bleeding angiectasias.  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  She also underwent a PET scan on 12/15/2022 that showed hypermetabolic activity in the left upper lbe nodule.  Last dose of radiation with Dr. Mitzi Hansen on 02/20/2023.   Work up in ER notable for Hgb 5.5, MCV 87.0, FOBT positive, BUN 12, creatinine 0.72  Patient lying in bed. Reports she was instructed by her oncologist to come into ER for low Hgb, but reported no symptoms of shortness of breath, chest pain, or dizziness.  Patient does report she was started on oral iron few weeks ago by her oncologist for iron deficiency.  She reports dark tarry least 2 months.  Patient is on Eliquis for history of PE and states her last dose was last night at 9 PM.  No NSAID use.  Patient reports 1 regular bowel movement daily.  Patient does take over-the-counter fiber supplementation.  Patient reports last colonoscopy was over 10 years ago in [place] and states it was normal.  She has  since then did a Cologuard at home which per patient was positive.  Family medical history includes cervical cancer in her mother and throat cancer in her brother.  Patient is a former smoker she was 72 years old.  Patient states she stopped in June of this year.  No alcohol use as of recent.  Patient denies nausea, vomiting, or abdominal pain. No reports of Hematemesis.  Patient with family at bedside, daughter. Provided some of the history.    Previous GI workup:   01/2023 CT ABDOMEN AND PELVIS WITH CONTRAST   IMPRESSION: 1. No acute abnormality or explanation for abdominal pain. 2. Slight interval increase in size of heterogeneous right adrenal mass, currently 6.5 x 4 cm, previously 5.2 x 3.1 cm.   Aortic Atherosclerosis (ICD10-I70.0).  12/09/2022 EGD with Dr. Leonides Schanz for Abnormal CT of the GI tract, adenocarcinoma of unclear origin.  Impression:  - Normal esophagus.  - Hiatal hernia.  - Nodular mucosa in the cardia. Biopsied.  - Gastritis. Biopsied.  - A single non- bleeding angioectasia in the duodenum.  - A single duodenal polyp. Biopsied.  - Six non- bleeding angioectasias in the duodenum.  - Duodenal lipoma. Biopsied.  FINAL MICROSCOPIC DIAGNOSIS:   A. DUODENUM, BIOPSY:  - Benign small bowel mucosa with no significant pathologic changes   B. AMPULLA, POLYPECTOMY:  - Duodenal mucosa with focal foveolar metaplasia, suggestive of peptic  injury   C.  STOMACH, BIOPSY:  - Mild reactive gastropathy.  - Negative for H. pylori on HE stain  - No intestinal metaplasia, dysplasia, or malignancy   D. GE JUNCTION, BIOPSY:  - Benign gastroesophageal junctional mucosa with no specific pathologic  changes  - Negative for intestinal metaplasia, dysplasia or malignancy   12/2022 PET scan  IMPRESSION: 1. Hypermetabolic spiculated left upper lobe lung nodule, concerning for primary lung malignancy. 2. Hypermetabolic large right adrenal gland mass, likely metastatic disease. 3.  Additional small solid pulmonary nodule of the left upper lobe with no significant FDG uptake, likely due to small size pulmonary metastatic disease can not be excluded. Recommend attention on follow-up. 4. Subtle peripheral opacity of the left lower lobe with mild FDG uptake, likely resolving pulmonary infarct. 5. Aortic Atherosclerosis (ICD10-I70.0) and Emphysema (ICD10-J43.9).  11/2022 MRI ABDOMEN WITHOUT AND WITH CONTRAST (INCLUDING MRCP)   IMPRESSION: 1. Large right adrenal mass which has imaging findings concerning for malignancy. There is also evidence of hemorrhage within the mass, which extends into the right perinephric space, as above. 2. 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 3. Cardiomegaly with left ventricular dilatation.    Abnormal ED labs: Abnormal Labs Reviewed  BASIC METABOLIC PANEL - Abnormal; Notable for the following components:      Result Value   Calcium 8.7 (*)    All other components within normal limits  CBC - Abnormal; Notable for the following components:   RBC 2.01 (*)    Hemoglobin 5.4 (*)    HCT 17.7 (*)    RDW 19.8 (*)    nRBC 0.4 (*)    All other components within normal limits  POC OCCULT BLOOD, ED - Abnormal; Notable for the following components:   Fecal Occult Bld POSITIVE (*)    All other components within normal limits    Past Medical History:  Diagnosis Date   Arthritis    Cancer (HCC)    right adrenal adenocarcinoma 11/23/22   Cardiomyopathy (HCC)    COPD (chronic obstructive pulmonary disease) (HCC)    Hypertension    Pulmonary embolism (HCC) 11/16/2022   Tobacco abuse     Surgical History:  She  has a past surgical history that includes Abdominal hysterectomy; Bilateral oophorectomy; Cataract extraction w/PHACO (12/27/2010); Cataract extraction w/PHACO (03/28/2011); Esophagogastroduodenoscopy (egd) with propofol (N/A, 12/09/2022); biopsy (12/09/2022); Bronchial needle aspiration biopsy (01/09/2023); and Bronchial biopsy  (01/09/2023). Family History:  Her family history includes High blood pressure in her mother; Ovarian cancer (age of onset: 78) in her mother; Throat cancer in her brother. Social History:   reports that she quit smoking about 3 months ago. Her smoking use included cigarettes. She started smoking about 30 years ago. She has a 15 pack-year smoking history. She has never used smokeless tobacco. She reports that she does not drink alcohol and does not use drugs.  Prior to Admission medications   Medication Sig Start Date End Date Taking? Authorizing Provider  acetaminophen (TYLENOL) 325 MG tablet Take 2 tablets (650 mg total) by mouth every 6 (six) hours as needed for mild pain (or Fever >/= 101). 11/26/22   [name], MD  apixaban (ELIQUIS) 5 MG TABS tablet Take 1 tablet (5 mg total) by mouth 2 (two) times daily. 11/26/22   [name], MD  Camphor-Menthol-Methyl Sal (SALONPAS) 3.06-11-08 % PTCH Place 1 patch onto the skin daily as needed (pain).    [provider]  dexamethasone (DECADRON) 4 MG tablet Take 4 mg by mouth daily as needed (  For arthritis pain).    [provider]  enoxaparin (LOVENOX) 60 MG/0.6ML injection Inject 0.6 mLs (60 mg total) into the skin every 12 (twelve) hours for 2 days. Hold Lovenox 12 hours before EGD. 12/07/22 12/09/22  Georga Kaufmann T, [name]  fluticasone furoate-vilanterol (BREO ELLIPTA) 100-25 MCG/ACT AEPB Inhale 1 puff into the lungs daily. 11/27/22   [name], MD  Fluticasone-Umeclidin-Vilant (TRELEGY ELLIPTA) 100-62.5-25 MCG/ACT AEPB Inhale 1 each into the lungs daily. 01/17/23   Bevelyn Ngo, NP  losartan (COZAAR) 50 MG tablet Take 1 tablet (50 mg total) by mouth daily. 12/21/22 03/21/23  Azalee Course, PA  metoprolol succinate (TOPROL-XL) 50 MG 24 hr tablet Take 1 tablet (50 mg total) by mouth daily. Take with or immediately following a meal. 12/21/22   Azalee Course, PA  Multiple Vitamin (MULTIVITAMIN) tablet Take 1 tablet by mouth daily.      [provider]  ondansetron (ZOFRAN) 8 MG tablet Take 1 tablet (8 mg total) by mouth every 8 (eight) hours as needed for nausea or vomiting. 02/09/23   Ronny Bacon, [name]  oxyCODONE (OXY IR/ROXICODONE) 5 MG immediate release tablet Take 1 tablet (5 mg total) by mouth every 6 (six) hours as needed for moderate pain. 12/22/22   Jaci Standard, MD  Polyethyl Glycol-Propyl Glycol (SYSTANE OP) Place 1 drop into both eyes daily as needed (dry eye).    [provider]  spironolactone (ALDACTONE) 25 MG tablet Take 0.5 tablets (12.5 mg total) by mouth daily. 12/21/22   Azalee Course, PA    Current Facility-Administered Medications  Medication Dose Route Frequency Provider Last Rate Last Admin   0.9 %  sodium chloride infusion (Manually program via Guardrails IV Fluids)   Intravenous Once Trifan, Kermit Balo, MD       pantoprazole (PROTONIX) injection 40 mg  40 mg Intravenous Q12H Bobette Mo, MD   40 mg at 02/27/23 1230   Current Outpatient Medications  Medication Sig Dispense Refill   acetaminophen (TYLENOL) 325 MG tablet Take 2 tablets (650 mg total) by mouth every 6 (six) hours as needed for mild pain (or Fever >/= 101).     apixaban (ELIQUIS) 5 MG TABS tablet Take 1 tablet (5 mg total) by mouth 2 (two) times daily. 60 tablet 2   Camphor-Menthol-Methyl Sal (SALONPAS) 3.06-11-08 % PTCH Place 1 patch onto the skin daily as needed (pain).     dexamethasone (DECADRON) 4 MG tablet Take 4 mg by mouth daily as needed (For arthritis pain).     enoxaparin (LOVENOX) 60 MG/0.6ML injection Inject 0.6 mLs (60 mg total) into the skin every 12 (twelve) hours for 2 days. Hold Lovenox 12 hours before EGD. 2.4 mL 0   fluticasone furoate-vilanterol (BREO ELLIPTA) 100-25 MCG/ACT AEPB Inhale 1 puff into the lungs daily. 28 each 0   Fluticasone-Umeclidin-Vilant (TRELEGY ELLIPTA) 100-62.5-25 MCG/ACT AEPB Inhale 1 each into the lungs daily.     losartan (COZAAR) 50 MG tablet Take 1 tablet (50 mg total) by  mouth daily. 90 tablet 3   metoprolol succinate (TOPROL-XL) 50 MG 24 hr tablet Take 1 tablet (50 mg total) by mouth daily. Take with or immediately following a meal. 30 tablet 1   Multiple Vitamin (MULTIVITAMIN) tablet Take 1 tablet by mouth daily.       ondansetron (ZOFRAN) 8 MG tablet Take 1 tablet (8 mg total) by mouth every 8 (eight) hours as needed for nausea or vomiting. 30 tablet 0   oxyCODONE (  OXY IR/ROXICODONE) 5 MG immediate release tablet Take 1 tablet (5 mg total) by mouth every 6 (six) hours as needed for moderate pain. 60 tablet 0   Polyethyl Glycol-Propyl Glycol (SYSTANE OP) Place 1 drop into both eyes daily as needed (dry eye).     spironolactone (ALDACTONE) 25 MG tablet Take 0.5 tablets (12.5 mg total) by mouth daily. 30 tablet 1    Allergies as of 02/27/2023   (No Known Allergies)    Review of Systems:    Constitutional: No weight loss, fever, chills, weakness or fatigue HEENT: Eyes: No change in vision               Ears, Nose, Throat:  No change in hearing or congestion Skin: No rash or itching Cardiovascular: No chest pain, chest pressure or palpitations   Respiratory: No SOB or cough Gastrointestinal: See HPI and otherwise negative Genitourinary: No dysuria or change in urinary frequency Neurological: No headache, dizziness or syncope Musculoskeletal: No new muscle or joint pain Hematologic: No bleeding or bruising Psychiatric: No history of depression or anxiety     Physical Exam:  Vital signs in last 24 hours: Temp:  [98 F (36.7 C)] 98 F (36.7 C) (09/23 0844) Pulse Rate:  [78-80] 80 (09/23 0844) Resp:  [14] 14 (09/23 0844) BP: (123-132)/(61-76) 132/76 (09/23 0844) SpO2:  [97 %-100 %] 100 % (09/23 0844) Weight:  [54 kg] 54 kg (09/23 0844)   Last BM recorded by nurses in past 5 days No data recorded  General:   Pleasant, well developed female in no acute distress Head:  Normocephalic and atraumatic. Eyes: sclerae anicteric,conjunctive pink  Heart:   regular rate and rhythm Pulm: Clear anteriorly; no wheezing Abdomen:  Soft, Non-distended AB, Hypoactive bowel sounds. No tenderness.Without guarding and Without rebound, No organomegaly appreciated. Extremities:  Without edema. Msk:  Symmetrical without gross deformities. Peripheral pulses intact.  Neurologic:  Alert and  oriented x4;  No focal deficits.  Skin:   Dry and intact without significant lesions or rashes. Psychiatric:  Cooperative. Normal mood and affect.  LAB RESULTS: Recent Labs    02/27/23 0735 02/27/23 1031  WBC 7.6 7.6  HGB 5.5* 5.4*  HCT 17.4* 17.7*  PLT 332 376   BMET Recent Labs    02/27/23 0735 02/27/23 1031  NA 136 135  K 3.9 4.4  CL 103 102  CO2 26 23  GLUCOSE 119* 89  BUN 11 12  CREATININE 0.87 0.72  CALCIUM 8.6* 8.7*   LFT Recent Labs    02/27/23 0735  PROT 7.5  ALBUMIN 2.9*  AST 11*  ALT 8  ALKPHOS 59  BILITOT 0.3       Impression /Plan:  Acute on chronic anemia . Baseline 9-10 since June 2024. Fecal occult positive. Patient on oral iron at home. Reports dark tarry stool for over 2 mths. EGD 12/2022 Dr. Leonides Schanz showed nonbleeding AVMs, gastritis Patient had colonoscopy over 10 years ago per pt.  Today Hgb 5.4, Hgb 9.0 (02/01/23)     Iron 19, TIBC 189 -Consider Enteroscopy for Wednesday given presentation and AVMs on previous EGD. If negative exam then Pacheco consider colonoscopy. -Hold Eliquis -Clear liquid diet -Recommend IV Iron/supportive therapy out pt with Hem/onc  PE/2024 (on Eliquis) Last dose 9/22 at 9pm.  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  Patient had 5 rounds of radiation on left upper lobe and in rounds of radiation on adrenal gland. -Last dose 9/16   Principal Problem:  Symptomatic anemia Active Problems:   HTN (hypertension)   LOS: 0 days   Thank you for your kind consultation, we will continue to follow.  Erin Pacheco  02/27/2023, 12:33 PM     [facility] Attending   I have taken an  interval history, reviewed the chart and examined the patient. 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 I suspect she has been bleeding from the AVM's.  Will plan for enteroscopy and ablation of AVM's tomorrow - she will have been off Eliquis x 36 hrs. Hopefully that will make a long-term difference and she will not need further transfusions. I am anticipating she will need to go back on Eliquis given PE hx.  The risks and benefits as well as alternatives of endoscopic procedure(s) have been discussed and reviewed. All questions answered. The patient agrees to proceed.  Erin Boop, MD, [facility] See Loretha Stapler on call - gastroenterology for best contact person 02/27/2023 3:35 PM

## 2023-02-27 NOTE — Telephone Encounter (Signed)
CRITICAL VALUE STICKER  CRITICAL VALUE:   Hgb 5.5  RECEIVER (on-site recipient of call): Daneil Dolin, LPN  DATE & TIME NOTIFIED: 07:56   02/27/23   MESSENGER (representative from lab): Melissa  MD NOTIFIED: Cassandra Heilingoetter, [name]  TIME OF NOTIFICATION:08:00  RESPONSE:

## 2023-02-27 NOTE — Consult Note (Addendum)
Consultation  Primary Care Physician:  [name], [name] Primary Gastroenterologist:  Dr. Leonides Schanz       Reason for Consultation: acute on chronic anemia DOA: 02/27/2023         Hospital Day: 1         HPI:   Erin Pacheco is a 72 y.o. female with past medical history significant for right adrenal mass with adenocarcinoma, LUL mass, HFrEF (EF 30-35%), pulmonary embolism on Eliquis, COPD, and IDA presents with low Hgb 5.5.  Patient was recently hospitalized 6/12-6/22/24 for abdominal pain and cough, found to have a PE. On imaging she was also noted to have a LUL lung mass and a right adrenal mass. Radiology performed a CT guided biopsy of the right adrenal mass 6/19 that showed adenocarcinoma, favored to be of upper GI versus pancreaticobiliary origin.  On 12/09/22 Dr. Leonides Schanz performed EGD that was negative for malignancy, but did show non-bleeding angiectasias.  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  She also underwent a PET scan on 12/15/2022 that showed hypermetabolic activity in the left upper lbe nodule.  Last dose of radiation with Dr. Mitzi Hansen on 02/20/2023.   Work up in ER notable for Hgb 5.5, MCV 87.0, FOBT positive, BUN 12, creatinine 0.72  Patient lying in bed. Reports she was instructed by her oncologist to come into ER for low Hgb, but reported no symptoms of shortness of breath, chest pain, or dizziness.  Patient does report she was started on oral iron few weeks ago by her oncologist for iron deficiency.  She reports dark tarry least 2 months.  Patient is on Eliquis for history of PE and states her last dose was last night at 9 PM.  No NSAID use.  Patient reports 1 regular bowel movement daily.  Patient does take over-the-counter fiber supplementation.  Patient reports last colonoscopy was over 10 years ago in [place] and states it was normal.  She has  since then did a Cologuard at home which per patient was positive.  Family medical history includes cervical cancer in her mother and throat cancer in her brother.  Patient is a former smoker she was 72 years old.  Patient states she stopped in June of this year.  No alcohol use as of recent.  Patient denies nausea, vomiting, or abdominal pain. No reports of Hematemesis.  Patient with family at bedside, daughter. Provided some of the history.    Previous GI workup:   01/2023 CT ABDOMEN AND PELVIS WITH CONTRAST   IMPRESSION: 1. No acute abnormality or explanation for abdominal pain. 2. Slight interval increase in size of heterogeneous right adrenal mass, currently 6.5 x 4 cm, previously 5.2 x 3.1 cm.   Aortic Atherosclerosis (ICD10-I70.0).  12/09/2022 EGD with Dr. Leonides Schanz for Abnormal CT of the GI tract, adenocarcinoma of unclear origin.  Impression:  - Normal esophagus.  - Hiatal hernia.  - Nodular mucosa in the cardia. Biopsied.  - Gastritis. Biopsied.  - A single non- bleeding angioectasia in the duodenum.  - A single duodenal polyp. Biopsied.  - Six non- bleeding angioectasias in the duodenum.  - Duodenal lipoma. Biopsied.  FINAL MICROSCOPIC DIAGNOSIS:   A. DUODENUM, BIOPSY:  - Benign small bowel mucosa with no significant pathologic changes   B. AMPULLA, POLYPECTOMY:  - Duodenal mucosa with focal foveolar metaplasia, suggestive of peptic  injury   C.  STOMACH, BIOPSY:  - Mild reactive gastropathy.  - Negative for H. pylori on HE stain  - No intestinal metaplasia, dysplasia, or malignancy   D. GE JUNCTION, BIOPSY:  - Benign gastroesophageal junctional mucosa with no specific pathologic  changes  - Negative for intestinal metaplasia, dysplasia or malignancy   12/2022 PET scan  IMPRESSION: 1. Hypermetabolic spiculated left upper lobe lung nodule, concerning for primary lung malignancy. 2. Hypermetabolic large right adrenal gland mass, likely metastatic disease. 3.  Additional small solid pulmonary nodule of the left upper lobe with no significant FDG uptake, likely due to small size pulmonary metastatic disease can not be excluded. Recommend attention on follow-up. 4. Subtle peripheral opacity of the left lower lobe with mild FDG uptake, likely resolving pulmonary infarct. 5. Aortic Atherosclerosis (ICD10-I70.0) and Emphysema (ICD10-J43.9).  11/2022 MRI ABDOMEN WITHOUT AND WITH CONTRAST (INCLUDING MRCP)   IMPRESSION: 1. Large right adrenal mass which has imaging findings concerning for malignancy. There is also evidence of hemorrhage within the mass, which extends into the right perinephric space, as above. 2. 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 3. Cardiomegaly with left ventricular dilatation.    Abnormal ED labs: Abnormal Labs Reviewed  BASIC METABOLIC PANEL - Abnormal; Notable for the following components:      Result Value   Calcium 8.7 (*)    All other components within normal limits  CBC - Abnormal; Notable for the following components:   RBC 2.01 (*)    Hemoglobin 5.4 (*)    HCT 17.7 (*)    RDW 19.8 (*)    nRBC 0.4 (*)    All other components within normal limits  POC OCCULT BLOOD, ED - Abnormal; Notable for the following components:   Fecal Occult Bld POSITIVE (*)    All other components within normal limits    Past Medical History:  Diagnosis Date   Arthritis    Cancer (HCC)    right adrenal adenocarcinoma 11/23/22   Cardiomyopathy (HCC)    COPD (chronic obstructive pulmonary disease) (HCC)    Hypertension    Pulmonary embolism (HCC) 11/16/2022   Tobacco abuse     Surgical History:  She  has a past surgical history that includes Abdominal hysterectomy; Bilateral oophorectomy; Cataract extraction w/PHACO (12/27/2010); Cataract extraction w/PHACO (03/28/2011); Esophagogastroduodenoscopy (egd) with propofol (N/A, 12/09/2022); biopsy (12/09/2022); Bronchial needle aspiration biopsy (01/09/2023); and Bronchial biopsy  (01/09/2023). Family History:  Her family history includes High blood pressure in her mother; Ovarian cancer (age of onset: 78) in her mother; Throat cancer in her brother. Social History:   reports that she quit smoking about 3 months ago. Her smoking use included cigarettes. She started smoking about 30 years ago. She has a 15 pack-year smoking history. She has never used smokeless tobacco. She reports that she does not drink alcohol and does not use drugs.  Prior to Admission medications   Medication Sig Start Date End Date Taking? Authorizing Provider  acetaminophen (TYLENOL) 325 MG tablet Take 2 tablets (650 mg total) by mouth every 6 (six) hours as needed for mild pain (or Fever >/= 101). 11/26/22   [name], MD  apixaban (ELIQUIS) 5 MG TABS tablet Take 1 tablet (5 mg total) by mouth 2 (two) times daily. 11/26/22   [name], MD  Camphor-Menthol-Methyl Sal (SALONPAS) 3.06-11-08 % PTCH Place 1 patch onto the skin daily as needed (pain).    [provider]  dexamethasone (DECADRON) 4 MG tablet Take 4 mg by mouth daily as needed (  For arthritis pain).    [provider]  enoxaparin (LOVENOX) 60 MG/0.6ML injection Inject 0.6 mLs (60 mg total) into the skin every 12 (twelve) hours for 2 days. Hold Lovenox 12 hours before EGD. 12/07/22 12/09/22  Georga Kaufmann T, [name]  fluticasone furoate-vilanterol (BREO ELLIPTA) 100-25 MCG/ACT AEPB Inhale 1 puff into the lungs daily. 11/27/22   [name], MD  Fluticasone-Umeclidin-Vilant (TRELEGY ELLIPTA) 100-62.5-25 MCG/ACT AEPB Inhale 1 each into the lungs daily. 01/17/23   Bevelyn Ngo, NP  losartan (COZAAR) 50 MG tablet Take 1 tablet (50 mg total) by mouth daily. 12/21/22 03/21/23  Azalee Course, PA  metoprolol succinate (TOPROL-XL) 50 MG 24 hr tablet Take 1 tablet (50 mg total) by mouth daily. Take with or immediately following a meal. 12/21/22   Azalee Course, PA  Multiple Vitamin (MULTIVITAMIN) tablet Take 1 tablet by mouth daily.      [provider]  ondansetron (ZOFRAN) 8 MG tablet Take 1 tablet (8 mg total) by mouth every 8 (eight) hours as needed for nausea or vomiting. 02/09/23   Ronny Bacon, [name]  oxyCODONE (OXY IR/ROXICODONE) 5 MG immediate release tablet Take 1 tablet (5 mg total) by mouth every 6 (six) hours as needed for moderate pain. 12/22/22   Jaci Standard, MD  Polyethyl Glycol-Propyl Glycol (SYSTANE OP) Place 1 drop into both eyes daily as needed (dry eye).    [provider]  spironolactone (ALDACTONE) 25 MG tablet Take 0.5 tablets (12.5 mg total) by mouth daily. 12/21/22   Azalee Course, PA    Current Facility-Administered Medications  Medication Dose Route Frequency Provider Last Rate Last Admin   0.9 %  sodium chloride infusion (Manually program via Guardrails IV Fluids)   Intravenous Once Trifan, Kermit Balo, MD       pantoprazole (PROTONIX) injection 40 mg  40 mg Intravenous Q12H Bobette Mo, MD   40 mg at 02/27/23 1230   Current Outpatient Medications  Medication Sig Dispense Refill   acetaminophen (TYLENOL) 325 MG tablet Take 2 tablets (650 mg total) by mouth every 6 (six) hours as needed for mild pain (or Fever >/= 101).     apixaban (ELIQUIS) 5 MG TABS tablet Take 1 tablet (5 mg total) by mouth 2 (two) times daily. 60 tablet 2   Camphor-Menthol-Methyl Sal (SALONPAS) 3.06-11-08 % PTCH Place 1 patch onto the skin daily as needed (pain).     dexamethasone (DECADRON) 4 MG tablet Take 4 mg by mouth daily as needed (For arthritis pain).     enoxaparin (LOVENOX) 60 MG/0.6ML injection Inject 0.6 mLs (60 mg total) into the skin every 12 (twelve) hours for 2 days. Hold Lovenox 12 hours before EGD. 2.4 mL 0   fluticasone furoate-vilanterol (BREO ELLIPTA) 100-25 MCG/ACT AEPB Inhale 1 puff into the lungs daily. 28 each 0   Fluticasone-Umeclidin-Vilant (TRELEGY ELLIPTA) 100-62.5-25 MCG/ACT AEPB Inhale 1 each into the lungs daily.     losartan (COZAAR) 50 MG tablet Take 1 tablet (50 mg total) by  mouth daily. 90 tablet 3   metoprolol succinate (TOPROL-XL) 50 MG 24 hr tablet Take 1 tablet (50 mg total) by mouth daily. Take with or immediately following a meal. 30 tablet 1   Multiple Vitamin (MULTIVITAMIN) tablet Take 1 tablet by mouth daily.       ondansetron (ZOFRAN) 8 MG tablet Take 1 tablet (8 mg total) by mouth every 8 (eight) hours as needed for nausea or vomiting. 30 tablet 0   oxyCODONE (  OXY IR/ROXICODONE) 5 MG immediate release tablet Take 1 tablet (5 mg total) by mouth every 6 (six) hours as needed for moderate pain. 60 tablet 0   Polyethyl Glycol-Propyl Glycol (SYSTANE OP) Place 1 drop into both eyes daily as needed (dry eye).     spironolactone (ALDACTONE) 25 MG tablet Take 0.5 tablets (12.5 mg total) by mouth daily. 30 tablet 1    Allergies as of 02/27/2023   (No Known Allergies)    Review of Systems:    Constitutional: No weight loss, fever, chills, weakness or fatigue HEENT: Eyes: No change in vision               Ears, Nose, Throat:  No change in hearing or congestion Skin: No rash or itching Cardiovascular: No chest pain, chest pressure or palpitations   Respiratory: No SOB or cough Gastrointestinal: See HPI and otherwise negative Genitourinary: No dysuria or change in urinary frequency Neurological: No headache, dizziness or syncope Musculoskeletal: No new muscle or joint pain Hematologic: No bleeding or bruising Psychiatric: No history of depression or anxiety     Physical Exam:  Vital signs in last 24 hours: Temp:  [98 F (36.7 C)] 98 F (36.7 C) (09/23 0844) Pulse Rate:  [78-80] 80 (09/23 0844) Resp:  [14] 14 (09/23 0844) BP: (123-132)/(61-76) 132/76 (09/23 0844) SpO2:  [97 %-100 %] 100 % (09/23 0844) Weight:  [54 kg] 54 kg (09/23 0844)   Last BM recorded by nurses in past 5 days No data recorded  General:   Pleasant, well developed female in no acute distress Head:  Normocephalic and atraumatic. Eyes: sclerae anicteric,conjunctive pink  Heart:   regular rate and rhythm Pulm: Clear anteriorly; no wheezing Abdomen:  Soft, Non-distended AB, Hypoactive bowel sounds. No tenderness.Without guarding and Without rebound, No organomegaly appreciated. Extremities:  Without edema. Msk:  Symmetrical without gross deformities. Peripheral pulses intact.  Neurologic:  Alert and  oriented x4;  No focal deficits.  Skin:   Dry and intact without significant lesions or rashes. Psychiatric:  Cooperative. Normal mood and affect.  LAB RESULTS: Recent Labs    02/27/23 0735 02/27/23 1031  WBC 7.6 7.6  HGB 5.5* 5.4*  HCT 17.4* 17.7*  PLT 332 376   BMET Recent Labs    02/27/23 0735 02/27/23 1031  NA 136 135  K 3.9 4.4  CL 103 102  CO2 26 23  GLUCOSE 119* 89  BUN 11 12  CREATININE 0.87 0.72  CALCIUM 8.6* 8.7*   LFT Recent Labs    02/27/23 0735  PROT 7.5  ALBUMIN 2.9*  AST 11*  ALT 8  ALKPHOS 59  BILITOT 0.3       Impression /Plan:  Acute on chronic anemia . Baseline 9-10 since June 2024. Fecal occult positive. Patient on oral iron at home. Reports dark tarry stool for over 2 mths. EGD 12/2022 Dr. Leonides Schanz showed nonbleeding AVMs, gastritis Patient had colonoscopy over 10 years ago per pt.  Today Hgb 5.4, Hgb 9.0 (02/01/23)     Iron 19, TIBC 189 -Consider Enteroscopy for Wednesday given presentation and AVMs on previous EGD. If negative exam then may consider colonoscopy. -Hold Eliquis -Clear liquid diet -Recommend IV Iron/supportive therapy out pt with Hem/onc  PE/2024 (on Eliquis) Last dose 9/22 at 9pm.  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  Patient had 5 rounds of radiation on left upper lobe and in rounds of radiation on adrenal gland. -Last dose 9/16   Principal Problem:  Symptomatic anemia Active Problems:   HTN (hypertension)   LOS: 0 days   Thank you for your kind consultation, we will continue to follow.  Deanna J May  02/27/2023, 12:33 PM     [facility] Attending   I have taken an  interval history, reviewed the chart and examined the patient. 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 I suspect she has been bleeding from the AVM's.  Will plan for enteroscopy and ablation of AVM's tomorrow - she will have been off Eliquis x 36 hrs. Hopefully that will make a long-term difference and she will not need further transfusions. I am anticipating she will need to go back on Eliquis given PE hx.  The risks and benefits as well as alternatives of endoscopic procedure(s) have been discussed and reviewed. All questions answered. The patient agrees to proceed.  Iva Boop, MD, [facility] See Loretha Stapler on call - gastroenterology for best contact person 02/27/2023 3:35 PM

## 2023-02-27 NOTE — Telephone Encounter (Signed)
HGB -5.5 Per [place] Dec in ED pt may go to Triage in ED .

## 2023-02-28 ENCOUNTER — Other Ambulatory Visit: Payer: Self-pay

## 2023-02-28 ENCOUNTER — Encounter (HOSPITAL_COMMUNITY): Payer: Self-pay | Admitting: Internal Medicine

## 2023-02-28 ENCOUNTER — Encounter (HOSPITAL_COMMUNITY)
Admission: EM | Disposition: A | Discharge status: Home / Self Care | Payer: Self-pay | Source: Home / Self Care | Attending: Internal Medicine

## 2023-02-28 ENCOUNTER — Telehealth: Payer: Self-pay

## 2023-02-28 ENCOUNTER — Observation Stay (HOSPITAL_COMMUNITY): Payer: Medicare HMO | Admitting: Anesthesiology

## 2023-02-28 ENCOUNTER — Observation Stay (HOSPITAL_COMMUNITY): Payer: Self-pay | Admitting: Anesthesiology

## 2023-02-28 DIAGNOSIS — K31819 Angiodysplasia of stomach and duodenum without bleeding: Secondary | ICD-10-CM | POA: Diagnosis not present

## 2023-02-28 DIAGNOSIS — K31811 Angiodysplasia of stomach and duodenum with bleeding: Secondary | ICD-10-CM

## 2023-02-28 DIAGNOSIS — D649 Anemia, unspecified: Secondary | ICD-10-CM | POA: Diagnosis not present

## 2023-02-28 DIAGNOSIS — C7971 Secondary malignant neoplasm of right adrenal gland: Secondary | ICD-10-CM | POA: Diagnosis not present

## 2023-02-28 DIAGNOSIS — Z6821 Body mass index (BMI) 21.0-21.9, adult: Secondary | ICD-10-CM | POA: Diagnosis not present

## 2023-02-28 DIAGNOSIS — Z86711 Personal history of pulmonary embolism: Secondary | ICD-10-CM | POA: Diagnosis not present

## 2023-02-28 DIAGNOSIS — C3412 Malignant neoplasm of upper lobe, left bronchus or lung: Secondary | ICD-10-CM | POA: Diagnosis not present

## 2023-02-28 DIAGNOSIS — J449 Chronic obstructive pulmonary disease, unspecified: Secondary | ICD-10-CM | POA: Diagnosis not present

## 2023-02-28 DIAGNOSIS — Z79899 Other long term (current) drug therapy: Secondary | ICD-10-CM | POA: Diagnosis not present

## 2023-02-28 DIAGNOSIS — K922 Gastrointestinal hemorrhage, unspecified: Secondary | ICD-10-CM | POA: Diagnosis not present

## 2023-02-28 DIAGNOSIS — I10 Essential (primary) hypertension: Secondary | ICD-10-CM

## 2023-02-28 DIAGNOSIS — I5020 Unspecified systolic (congestive) heart failure: Secondary | ICD-10-CM | POA: Diagnosis not present

## 2023-02-28 DIAGNOSIS — I5021 Acute systolic (congestive) heart failure: Secondary | ICD-10-CM | POA: Diagnosis not present

## 2023-02-28 DIAGNOSIS — R634 Abnormal weight loss: Secondary | ICD-10-CM | POA: Diagnosis not present

## 2023-02-28 DIAGNOSIS — J439 Emphysema, unspecified: Secondary | ICD-10-CM | POA: Diagnosis not present

## 2023-02-28 DIAGNOSIS — Z87891 Personal history of nicotine dependence: Secondary | ICD-10-CM

## 2023-02-28 DIAGNOSIS — Z90710 Acquired absence of both cervix and uterus: Secondary | ICD-10-CM | POA: Diagnosis not present

## 2023-02-28 DIAGNOSIS — Z7901 Long term (current) use of anticoagulants: Secondary | ICD-10-CM | POA: Diagnosis not present

## 2023-02-28 DIAGNOSIS — E440 Moderate protein-calorie malnutrition: Secondary | ICD-10-CM | POA: Diagnosis not present

## 2023-02-28 DIAGNOSIS — I5022 Chronic systolic (congestive) heart failure: Secondary | ICD-10-CM | POA: Diagnosis not present

## 2023-02-28 DIAGNOSIS — D62 Acute posthemorrhagic anemia: Secondary | ICD-10-CM | POA: Diagnosis not present

## 2023-02-28 DIAGNOSIS — I110 Hypertensive heart disease with heart failure: Secondary | ICD-10-CM | POA: Diagnosis not present

## 2023-02-28 DIAGNOSIS — E869 Volume depletion, unspecified: Secondary | ICD-10-CM | POA: Diagnosis not present

## 2023-02-28 HISTORY — PX: ENTEROSCOPY: SHX5533

## 2023-02-28 HISTORY — PX: SUBMUCOSAL TATTOO INJECTION: SHX6856

## 2023-02-28 HISTORY — PX: HOT HEMOSTASIS: SHX5433

## 2023-02-28 LAB — BPAM RBC
Blood Product Expiration Date: 202410212359
Blood Product Expiration Date: 202410212359
Blood Product Expiration Date: 202410222359
Blood Product Expiration Date: 202410222359
ISSUE DATE / TIME: 202409201737
ISSUE DATE / TIME: 202409232356
ISSUE DATE / TIME: 202409231616
ISSUE DATE / TIME: 202409231837
Unit Type and Rh: 6200
Unit Type and Rh: 6200
Unit Type and Rh: 6200
Unit Type and Rh: 6200

## 2023-02-28 LAB — CBC
HCT: 27.2 % — ABNORMAL LOW (ref 36.0–46.0)
Hemoglobin: 8.7 g/dL — ABNORMAL LOW (ref 12.0–15.0)
MCH: 28 pg (ref 26.0–34.0)
MCHC: 32 g/dL (ref 30.0–36.0)
MCV: 87.5 fL (ref 80.0–100.0)
Platelets: 304 10*3/uL (ref 150–400)
RBC: 3.11 MIL/uL — ABNORMAL LOW (ref 3.87–5.11)
RDW: 17.5 % — ABNORMAL HIGH (ref 11.5–15.5)
WBC: 8.2 10*3/uL (ref 4.0–10.5)
nRBC: 0.5 % — ABNORMAL HIGH (ref 0.0–0.2)

## 2023-02-28 LAB — TYPE AND SCREEN
ABO/RH(D): A POS
ABO/RH(D): A POS
Antibody Screen: NEGATIVE
Antibody Screen: NEGATIVE
Unit division: 0
Unit division: 0
Unit division: 0
Unit division: 0

## 2023-02-28 LAB — COMPREHENSIVE METABOLIC PANEL
ALT: 11 U/L (ref 0–44)
AST: 14 U/L — ABNORMAL LOW (ref 15–41)
Albumin: 2.3 g/dL — ABNORMAL LOW (ref 3.5–5.0)
Alkaline Phosphatase: 54 U/L (ref 38–126)
Anion gap: 10 (ref 5–15)
BUN: 8 mg/dL (ref 8–23)
CO2: 23 mmol/L (ref 22–32)
Calcium: 8.6 mg/dL — ABNORMAL LOW (ref 8.9–10.3)
Chloride: 103 mmol/L (ref 98–111)
Creatinine, Ser: 0.72 mg/dL (ref 0.44–1.00)
GFR, Estimated: >60 mL/min (ref >60)
Glucose, Bld: 94 mg/dL (ref 70–99)
Potassium: 4.1 mmol/L (ref 3.5–5.1)
Sodium: 136 mmol/L (ref 135–145)
Total Bilirubin: 1.2 mg/dL (ref 0.3–1.2)
Total Protein: 7.3 g/dL (ref 6.5–8.1)

## 2023-02-28 SURGERY — ENTEROSCOPY
Anesthesia: Monitor Anesthesia Care

## 2023-02-28 MED ORDER — PROPOFOL 500 MG/50ML IV EMUL
INTRAVENOUS | Status: DC | PRN
  Administered 2023-02-28: 125 ug/kg/min via INTRAVENOUS
  Administered 2023-02-28: 50 mg via INTRAVENOUS

## 2023-02-28 MED ORDER — SPOT INK MARKER SYRINGE KIT
PACK | SUBMUCOSAL | Status: DC | PRN
Start: 2023-02-28 — End: 2023-02-28
  Administered 2023-02-28: 1 mL via SUBMUCOSAL

## 2023-02-28 MED ORDER — SPOT INK MARKER SYRINGE KIT
PACK | SUBMUCOSAL | Status: AC
  Filled 2023-02-28: qty 5

## 2023-02-28 MED ORDER — ENTRESTO 49-51 MG PO TABS: 1.0000 | ORAL_TABLET | Freq: Two times a day (BID) | ORAL | 5 refills | Status: DC

## 2023-02-28 MED ORDER — PHENYLEPHRINE HCL (PRESSORS) 10 MG/ML IV SOLN
INTRAVENOUS | Status: DC | PRN
  Administered 2023-02-28 (×4): 80 ug via INTRAVENOUS

## 2023-02-28 MED ORDER — LIDOCAINE 2% (20 MG/ML) 5 ML SYRINGE
INTRAMUSCULAR | Status: DC | PRN
Start: 2023-02-28 — End: 2023-02-28
  Administered 2023-02-28: 60 mg via INTRAVENOUS

## 2023-02-28 MED ORDER — PANTOPRAZOLE SODIUM 40 MG PO TBEC
40.0000 mg | DELAYED_RELEASE_TABLET | Freq: Every day | ORAL | Status: DC
  Administered 2023-03-01: 40 mg via ORAL
  Filled 2023-02-28: qty 1

## 2023-02-28 MED ORDER — SODIUM CHLORIDE 0.9 % IV SOLN: INTRAVENOUS | Status: DC

## 2023-02-28 MED ORDER — LACTATED RINGERS IV SOLN
INTRAVENOUS | Status: DC | PRN
Start: 2023-02-28 — End: 2023-02-28

## 2023-02-28 NOTE — Interval H&P Note (Signed)
History and Physical Interval Note:  02/28/2023 12:58 PM  Erin Pacheco  has presented today for surgery, with the diagnosis of Anemia.  The various methods of treatment have been discussed with the patient and family. After consideration of risks, benefits and other options for treatment, the patient has consented to  Procedure(s): ENTEROSCOPY (N/A) as a surgical intervention.  The patient's history has been reviewed, patient examined, no change in status, stable for surgery.  I have reviewed the patient's chart and labs.  Questions were answered to the patient's satisfaction.     Stan Head

## 2023-02-28 NOTE — Progress Notes (Signed)
Transition of Care [facility]) - Inpatient Brief Assessment   Patient Details  Name: Erin Pacheco MRN: [id number] Date of Birth: [birth year]  [facility] CM/SW Contact:    Larrie Kass, LCSW Phone Number: 02/28/2023, 11:04 AM   Clinical Narrative: CSW met with pt and pt's daughter, they are requesting to complete HCPOA paperwork. CSW message MD for consult to chaplin. No further TOC needs TOC sign off.    Transition of Care Asessment: Insurance and Status: Insurance coverage has been reviewed Patient has primary care physician: Yes Home environment has been reviewed: home with adult children Prior level of function:: mod independent Prior/Current Home Services: No current home services Social Determinants of Health Reivew: SDOH reviewed no interventions necessary Readmission risk has been reviewed: Yes Transition of care needs: no transition of care needs at this time

## 2023-02-28 NOTE — Plan of Care (Signed)

## 2023-02-28 NOTE — Care Management Obs Status (Signed)
MEDICARE OBSERVATION STATUS NOTIFICATION   Patient Details  Name: Erin Pacheco MRN: [id number] Date of Birth: 04-Nov-1950   Medicare Observation Status Notification Given:  Yes    Larrie Kass, LCSW 02/28/2023, 10:48 AM

## 2023-02-28 NOTE — Transfer of Care (Signed)
Immediate Anesthesia Transfer of Care Note  Patient: Erin Pacheco  Procedure(s) Performed: ENTEROSCOPY SUBMUCOSAL TATTOO INJECTION HOT HEMOSTASIS (ARGON PLASMA COAGULATION/BICAP)  Patient Location: PACU  Anesthesia Type:MAC  Level of Consciousness: drowsy and patient cooperative  Airway & Oxygen Therapy: Patient Spontanous Breathing  Post-op Assessment: Report given to RN and Post -op Vital signs reviewed and stable  Post vital signs: Reviewed and stable  Last Vitals:  Vitals Value Taken Time  BP 124/49 02/28/23 1400  Temp    Pulse 61 02/28/23 1401  Resp 26 02/28/23 1401  SpO2 99 % on RA 02/28/23 1401  Vitals shown include unfiled device data.  Last Pain:  Vitals:   02/28/23 1213  TempSrc: Temporal  PainSc: 0-No pain         Complications: No notable events documented.

## 2023-02-28 NOTE — Anesthesia Postprocedure Evaluation (Signed)
Anesthesia Post Note  Patient: Erin Pacheco  Procedure(s) Performed: ENTEROSCOPY SUBMUCOSAL TATTOO INJECTION HOT HEMOSTASIS (ARGON PLASMA COAGULATION/BICAP)     Patient location during evaluation: PACU Anesthesia Type: MAC Level of consciousness: awake and alert Pain management: pain level controlled Vital Signs Assessment: post-procedure vital signs reviewed and stable Respiratory status: spontaneous breathing, nonlabored ventilation and respiratory function stable Cardiovascular status: blood pressure returned to baseline Postop Assessment: no apparent nausea or vomiting Anesthetic complications: no   No notable events documented.  Last Vitals:  Vitals:   02/28/23 1400 02/28/23 1420  BP: (!) 124/49 (!) 142/53  Pulse: 63 63  Resp: (!) 28 (!) 26  Temp: 36.6 C   SpO2: 91% 92%    Last Pain:  Vitals:   02/28/23 1420  TempSrc:   PainSc: 0-No pain                 [name]

## 2023-02-28 NOTE — Op Note (Addendum)
[facility] Patient Name: Erin Pacheco Procedure Date: 02/28/2023 MRN: [id number] Attending MD: Iva Boop , MD, [id number] Date of Birth: September 14, 1950 CSN: [id number] Age: 72 Admit Type: Inpatient Procedure:                Small bowel enteroscopy Indications:              Anemia, Occult blood in stool Providers:                Iva Boop, MD, Jacquelyn "Jaci" Clelia Croft, RN,                            Fransisca Connors, Janie Billups, Jaynie Bream CRNA Referring MD:              Medicines:                Monitored Anesthesia Care Complications:            No immediate complications. Estimated Blood Loss:     Estimated blood loss: none. Procedure:                Pre-Anesthesia Assessment:                           - Prior to the procedure, a History and Physical                            was performed, and patient medications and                            allergies were reviewed. The patient's tolerance of                            previous anesthesia was also reviewed. The risks                            and benefits of the procedure and the sedation                            options and risks were discussed with the patient.                            All questions were answered, and informed consent                            was obtained. Prior Anticoagulants: The patient                            last took Eliquis (apixaban) 2 days prior to the                            procedure. ASA Grade Assessment: IV - A patient  with severe systemic disease that is a constant                            threat to life. After reviewing the risks and                            benefits, the patient was deemed in satisfactory                            condition to undergo the procedure.                           After obtaining informed consent, the endoscope was                            passed under  direct vision. Throughout the                            procedure, the patient's blood pressure, pulse, and                            oxygen saturations were monitored continuously. The                            [id number]-[id number] ([id number] Olympus colonoscope was                            introduced through the mouth and advanced to the                            proximal jejunum. The small bowel enteroscopy was                            accomplished without difficulty. The patient                            tolerated the procedure well. 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 Coagulation for bleeding       prevention using argon plasma was successful. Estimated blood loss: none.      A single angiodysplastic lesion with no bleeding was found in the       gastric antrum. Coagulation for bleeding prevention using argon plasma       was successful. Estimated blood loss: none.      The examined esophagus was normal.      There was no evidence of significant pathology in the proximal jejunum.      An area in the proximal jejunum was tattooed with an injection of 1 mL       of Spot (carbon black). Estimated blood loss: none. 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 Treated with argon plasma coagulation                            (APC). 13 ablated.                           - A single non-bleeding angiodysplastic lesion in                            the stomach. Treated with argon plasma coagulation                            (APC). 1 ablated                           - Normal esophagus.                           - The examined portion of the jejunum was normal.                           - An area in the proximal jejunum was tattooed  to                            mark extent of exam                           - No specimens collected. 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 Recommendation:           - return to floor and observe                           restart Eliquis in 2 days if ok - consider reduced                            dose to reduce chance of further bleeding                           continue iron tx - hem-onc can consider parenteral                            as outpatient                           if ok in AM may be dc - does not need outpatient GI                            appointment                           daily PPI  x 1 month                           signing off Procedure Code(s):        --- Professional ---                           [phone], Small intestinal endoscopy, enteroscopy                            beyond second portion of duodenum, not including                            ileum; with control of bleeding (eg, injection,                            bipolar cautery, unipolar cautery, laser, heater                            probe, stapler, plasma coagulator)                           44799, Unlisted procedure, small intestine Diagnosis Code(s):        --- Professional ---                           A21.308, Angiodysplasia of stomach and duodenum                            without bleeding                           D64.9, Anemia, unspecified                           R19.5, Other fecal abnormalities CPT copyright 2022 [employer]. All rights reserved. The codes documented in this report are preliminary and upon coder review may  be revised to meet current compliance requirements. Iva Boop, MD 02/28/2023 2:14:43 PM This report has been signed electronically. Number of Addenda: 0

## 2023-02-28 NOTE — Progress Notes (Signed)
[facility]                                                                              Erin Pacheco, is a 72 y.o. female, DOB - [birth year], ZOX:[id number] Admit date - 02/27/2023    Outpatient Primary MD for the patient is [name], [name]  LOS - 0  days  Chief Complaint  Patient presents with   low hemoglobin       Brief summary   [name] with osteoarthritis, right renal CA, cardiomyopathy, COPD, HTN, pulmonary embolism on Eliquis, tobacco use, chronic systolic CHF, known normal cell CHF, iron deficiency, chronic blood loss anemia, was sent from cancer center due to hemoglobin of 5.6.  She had an episode of melena since last week prior to admission.  Very tired but no chest pain, palpitations, dizziness or diaphoresis. In ED, hemoglobin 5.4, platelets 376 FOBT positive. GI consulted.  Assessment & Plan    Principal Problem:   Symptomatic anemia, acute on chronic blood loss anemia, iron deficiency -Baseline hemoglobin ~9.0 -Presented with hemoglobin of 5.4 with melanotic stools, FOBT positive.  Dark tarry stools for 2 months.  EGD 12/2022 had shown nonbleeding AVMs, gastritis. -Hold Eliquis -GI consulted, appreciate recommendations, plan for endoscopy today -N.p.o.   Active Problems: Chronic systolic CHF HFrEF (heart failure with reduced ejection fraction) /EF 30 to 35 % -Outpatient on Entresto, Toprol-XL, Aldactone -Continue Toprol-XL, Aldactone, losartan.  Not on any loop diuretics.    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 Arbutus Ped.    Moderate protein malnutrition (HCC) -In the setting of malignancy Estimated body mass index is 21.04 kg/m as calculated  from the following:   Height as of this encounter: 5\' 3"  (1.6 m).   Weight as of this encounter: 53.9 kg.  Code Status: Full code DVT Prophylaxis:  SCDs Start: 02/27/23 1438   Level of Care: Level of care: Telemetry Family Communication: Updated patient's daughter at the bedside Disposition Plan:      Remains inpatient appropriate:      Procedures:    Consultants:   Gastroenterology  Antimicrobials:   Anti-infectives (From admission, onward)    None          Medications  sodium chloride   Intravenous Once   feeding supplement  1 Container Oral TID BM   fluticasone furoate-vilanterol  1 puff Inhalation Daily   And   umeclidinium bromide  1 puff Inhalation Daily   losartan  50 mg Oral Daily   metoprolol succinate  50 mg Oral Daily   pantoprazole (PROTONIX) IV  40 mg Intravenous Q12H   spironolactone  12.5 mg Oral Daily      Subjective:   Erin Pacheco was seen and examined today.  Had a BM this morning, black/tarry.  No abdominal pain, nausea vomiting, chest pain or shortness of breath.  Daughter at the bedside.  Objective:   Vitals:   02/27/23 2210 02/28/23 0010 02/28/23 0509 02/28/23 0845  BP: (!) 154/75 (!) 149/74 (!) 147/66 (!) 148/64  Pulse: 88 80 75 70  Resp: 20 15 16    Temp: 99.2 F (37.3 C) 99.2 F (37.3 C) 99.3 F (37.4 C) 98.6 F (37 C)  TempSrc: Oral Oral Oral Oral  SpO2: 96% 96% 96% 100%  Weight:      Height:        Intake/Output Summary (Last 24 hours) at 02/28/2023 1200 Last data filed at 02/28/2023 0000 Gross per 24 hour  Intake 1228.33 ml  Output --  Net 1228.33 ml     Wt Readings from Last 3 Encounters:  02/27/23 53.9 kg  01/17/23 57.7 kg  01/17/23 57.7 kg     Exam General: Alert and oriented x 3, NAD, ill-appearing Cardiovascular: S1 S2 auscultated,  RRR Respiratory: Clear to auscultation bilaterally, no wheezing Gastrointestinal: Soft, nontender, nondistended, + bowel sounds Ext: no pedal edema  bilaterally Neuro: no new deficits Psych: Normal affect     Data Reviewed:  I have personally reviewed following labs    CBC Lab Results  Component Value Date   WBC 8.2 02/28/2023   RBC 3.11 (L) 02/28/2023   HGB 8.7 (L) 02/28/2023   HCT 27.2 (L) 02/28/2023   MCV 87.5 02/28/2023   MCH 28.0 02/28/2023   PLT 304 02/28/2023   MCHC 32.0 02/28/2023   RDW 17.5 (H) 02/28/2023   LYMPHSABS 0.4 (L) 02/27/2023   MONOABS 0.9 02/27/2023   EOSABS 0.1 02/27/2023   BASOSABS 0.1 02/27/2023     Last metabolic panel Lab Results  Component Value Date   NA 136 02/28/2023   K 4.1 02/28/2023   CL 103 02/28/2023   CO2 23 02/28/2023   BUN 8 02/28/2023   CREATININE 0.72 02/28/2023   GLUCOSE 94 02/28/2023   GFRNONAA >60 02/28/2023   GFRAA 57 (L) 05/30/2012   CALCIUM 8.6 (L) 02/28/2023   PROT 7.3 02/28/2023   ALBUMIN 2.3 (L) 02/28/2023   BILITOT 1.2 02/28/2023   ALKPHOS 54 02/28/2023   AST 14 (L) 02/28/2023   ALT 11 02/28/2023   ANIONGAP 10 02/28/2023    CBG (last 3)  No results for input(s): "GLUCAP" in the last 72 hours.    Coagulation Profile: No results for input(s): "INR", "PROTIME" in the last 168 hours.   Radiology Studies: I have personally reviewed the imaging studies  No results found.     Thad Ranger M.D. [facility] 02/28/2023, 12:00 PM  Available via Epic secure chat 7am-7pm After 7 pm, please refer to night coverage provider listed on amion.

## 2023-02-28 NOTE — Progress Notes (Signed)
Chaplain acknowledges consult for assisting Erin Pacheco with creating advance directives. If she continues her care on an inpatient unit, we would be glad to assist her with this. She will not be able to sign HCPOA paperwork until 24 hours post-anesthesia.    [address] Bcc Pager, [phone]

## 2023-02-28 NOTE — Telephone Encounter (Addendum)
Called patient regarding results spoke with daughter mom is in the hospital for procedure, verbalized understanding of results, sent Entresto 49-51 mg to [facility] on [address] Ms Erin Pacheco will start once she is released from hospital, lab requisite for BMET and Sherryll Burger coupon will be mailed to home.  ----- Message from Azalee Course sent at 02/17/2023  8:21 AM EDT ----- Pumping function of heart unchanged when compare to previous echo in June. Let's switch losartan to a stronger heart failure medication, discontinue losartan, transition to Entresto 49-51mg  BID. Please give the patient a 30 day coupon for the entresto as well. Obtain BMET in 2 weeks after starting Entresto. Dr. Antoine Poche to further review echo report on follow up.

## 2023-03-01 DIAGNOSIS — I5020 Unspecified systolic (congestive) heart failure: Secondary | ICD-10-CM | POA: Diagnosis not present

## 2023-03-01 DIAGNOSIS — I10 Essential (primary) hypertension: Secondary | ICD-10-CM | POA: Diagnosis not present

## 2023-03-01 DIAGNOSIS — K922 Gastrointestinal hemorrhage, unspecified: Secondary | ICD-10-CM | POA: Diagnosis not present

## 2023-03-01 DIAGNOSIS — D649 Anemia, unspecified: Secondary | ICD-10-CM | POA: Diagnosis not present

## 2023-03-01 LAB — CBC
HCT: 28.7 % — ABNORMAL LOW (ref 36.0–46.0)
Hemoglobin: 9.1 g/dL — ABNORMAL LOW (ref 12.0–15.0)
MCH: 27.7 pg (ref 26.0–34.0)
MCHC: 31.7 g/dL (ref 30.0–36.0)
MCV: 87.2 fL (ref 80.0–100.0)
Platelets: 300 10*3/uL (ref 150–400)
RBC: 3.29 MIL/uL — ABNORMAL LOW (ref 3.87–5.11)
RDW: 17.8 % — ABNORMAL HIGH (ref 11.5–15.5)
WBC: 10.7 10*3/uL — ABNORMAL HIGH (ref 4.0–10.5)
nRBC: 0.4 % — ABNORMAL HIGH (ref 0.0–0.2)

## 2023-03-01 LAB — BASIC METABOLIC PANEL
Anion gap: 9 (ref 5–15)
BUN: 10 mg/dL (ref 8–23)
CO2: 23 mmol/L (ref 22–32)
Calcium: 8.1 mg/dL — ABNORMAL LOW (ref 8.9–10.3)
Chloride: 99 mmol/L (ref 98–111)
Creatinine, Ser: 0.75 mg/dL (ref 0.44–1.00)
GFR, Estimated: >60 mL/min (ref >60)
Glucose, Bld: 122 mg/dL — ABNORMAL HIGH (ref 70–99)
Potassium: 3.4 mmol/L — ABNORMAL LOW (ref 3.5–5.1)
Sodium: 131 mmol/L — ABNORMAL LOW (ref 135–145)

## 2023-03-01 MED ORDER — POTASSIUM CHLORIDE CRYS ER 20 MEQ PO TBCR
20.0000 meq | EXTENDED_RELEASE_TABLET | Freq: Once | ORAL | Status: AC
  Administered 2023-03-01: 20 meq via ORAL
  Filled 2023-03-01: qty 1

## 2023-03-01 MED ORDER — APIXABAN 5 MG PO TABS: 5.0000 mg | ORAL_TABLET | Freq: Two times a day (BID) | ORAL | Status: DC

## 2023-03-01 MED ORDER — PANTOPRAZOLE SODIUM 40 MG PO TBEC: 40.0000 mg | DELAYED_RELEASE_TABLET | Freq: Every day | ORAL | 3 refills | Status: DC

## 2023-03-01 NOTE — Progress Notes (Signed)
AVS and discharge instructions reviewed w/ patient. Patient verbalized understanding.

## 2023-03-01 NOTE — Discharge Summary (Addendum)
Physician Discharge Summary   Patient: Erin Pacheco MRN: [id number] DOB: 04-14-1951  Admit date:     02/27/2023  Discharge date: 03/01/23  Discharge Physician: Thad Ranger, MD    PCP: [name], [name]   Recommendations at discharge:   Continue Protonix 40 mg daily  Discharge Diagnoses:  Acute on chronic blood loss anemia, symptomatic anemia   Angiodysplasia of stomach and duodenum with bleeding   HFrEF (heart failure with reduced ejection fraction) /EF 30 to 35 %   HTN (hypertension)   Iron deficiency anemia due to chronic blood loss   Malignant neoplasm of upper lobe of left lung (HCC)   Moderate protein malnutrition [facility])     Hospital Course:  [name] with osteoarthritis, right renal CA, cardiomyopathy, COPD, HTN, pulmonary embolism on Eliquis, tobacco use, chronic systolic CHF, known normal cell CHF, iron deficiency, chronic blood loss anemia, was sent from cancer center due to hemoglobin of 5.6.  She had an episode of melena since last week prior to admission.  Very tired but no chest pain, palpitations, dizziness or diaphoresis. In ED, hemoglobin 5.4, platelets 376 FOBT positive. GI consulted.   Assessment and Plan:  Symptomatic anemia, acute on chronic blood loss anemia, iron deficiency -Baseline hemoglobin ~9.0 -Presented with hemoglobin of 5.4 with melanotic stools, FOBT positive.  Dark tarry stools for 2 months.  EGD 12/2022 had shown nonbleeding AVMs, gastritis. -Eliquis was held -Underwent small bowel enteroscopy which showed multiple nonbleeding angiodysplastic lesions in the duodenum, treated with argon plasma coagulation, 13 ablated.  Single nonbleeding angiodysplastic lesion in the stomach treated with APC, 1 ablated, normal esophagus.  Examined portion of jejunum was normal. -No further bleeding, hemoglobin 9.1 today, -Continue PPI.  Per GI okay to restart Eliquis in 2 days.    Chronic systolic CHF HFrEF (heart failure with  reduced ejection fraction) /EF 30 to 35 % -Outpatient on Entresto, Toprol-XL, Aldactone.  - Losartan discontinued.    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 Arbutus Ped.     Moderate protein malnutrition (HCC) -In the setting of malignancy Estimated body mass index is 21.04 kg/m as calculated from the following:   Height as of this encounter: 5\' 3"  (1.6 m).   Weight as of this encounter: 53.9 kg.        Pain control - [employer] Controlled Substance Reporting System database was reviewed. and patient was instructed, not to drive, operate heavy machinery, perform activities at heights, swimming or participation in water activities or provide baby-sitting services while on Pain, Sleep and Anxiety Medications; until their outpatient Physician has advised to do so again. Also recommended to not to take more than prescribed Pain, Sleep and Anxiety Medications.  Consultants: GI Procedures performed: Small bowel enteroscopy Disposition: Home Diet recommendation:  Discharge Diet Orders (From admission, onward)     Start     Ordered   03/01/23 0000  Diet - low sodium heart healthy        03/01/23 0851           DISCHARGE MEDICATION: Allergies as of 03/01/2023   No Known Allergies      Medication List     STOP taking these medications    ferrous sulfate 325 (  65 FE) MG tablet       TAKE these medications    acetaminophen 325 MG tablet Commonly known as: TYLENOL Take 2 tablets (650 mg total) by mouth every 6 (six) hours as needed for mild pain (or Fever >/= 101).   apixaban 5 MG Tabs tablet Commonly known as: ELIQUIS Take 1 tablet (5 mg total) by mouth 2 (two) times daily. Start taking on: March 04, 2023 What changed: These instructions start on March 04, 2023. If you are unsure what to do until then, ask your doctor or other care provider.   Entresto 49-51 MG Generic drug: sacubitril-valsartan Take 1 tablet by mouth 2 (two) times daily.   metoprolol succinate 50 MG 24 hr tablet Commonly known as: TOPROL-XL Take 1 tablet (50 mg total) by mouth daily. Take with or immediately following a meal.   multivitamin tablet Take 1 tablet by mouth daily.   ondansetron 8 MG tablet Commonly known as: ZOFRAN Take 1 tablet (8 mg total) by mouth every 8 (eight) hours as needed for nausea or vomiting.   oxyCODONE 5 MG immediate release tablet Commonly known as: Oxy IR/ROXICODONE Take 1 tablet (5 mg total) by mouth every 6 (six) hours as needed for moderate pain.   pantoprazole 40 MG tablet Commonly known as: PROTONIX Take 1 tablet (40 mg total) by mouth daily before breakfast.   spironolactone 25 MG tablet Commonly known as: ALDACTONE Take 0.5 tablets (12.5 mg total) by mouth daily.   SYSTANE OP Place 1 drop into both eyes daily as needed (dry eye).   Trelegy Ellipta 100-62.5-25 MCG/ACT Aepb Generic drug: Fluticasone-Umeclidin-Vilant Inhale 1 each into the lungs daily.        Follow-up Information     [name], [name]. Schedule an appointment as soon as possible for a visit in 2 week(s).   Specialty: Physician Assistant Why: for hospital follow-up Contact information: [address] [place] [address] [phone]         Rollene Rotunda, MD. Schedule an appointment as soon as possible for a visit in 2 week(s).   Specialty: Cardiology Why: for hospital follow-up Contact information: [address] [place] [address] [phone]         Imogene Burn, MD. Schedule an appointment as soon as possible for a visit in 2 week(s).   Specialty: Gastroenterology Why: for hospital follow-up Contact information: [address] [place] [address] [phone]                Discharge Exam: Ceasar Mons Weights   02/27/23 0844 02/27/23 1619 02/28/23 1213  Weight: 54 kg 53.9 kg 53.9 kg   Ambulating, feels good, no further bleeding.  Wants to go home.  Daughter at the bedside.   BP (!) 142/82 (BP Location: Right Arm)   Pulse 72   Temp 99.3 F (37.4 C) (Oral)   Resp 18   Ht 5\' 3"  (1.6 m)   Wt 53.9 kg   SpO2 100%   BMI 21.04 kg/m   Physical Exam General: Alert and oriented x 3, NAD Cardiovascular: S1 S2 clear, RRR.  Respiratory: CTAB, no wheezing, rales or rhonchi Gastrointestinal: Soft, nontender, nondistended, NBS Ext: no pedal edema bilaterally Neuro: no new deficits Psych: Normal affect   Condition at discharge: fair  The results of significant diagnostics from this hospitalization (including imaging, microbiology, ancillary and laboratory) are listed below for reference.   Imaging Studies: ECHOCARDIOGRAM LIMITED  Result Date: 02/15/2023    ECHOCARDIOGRAM LIMITED REPORT   Patient Name:   JAJAIRA BOHLMAN Date of Exam: 02/15/2023 Medical Rec #:  [id number]        Height:       62.0 in Accession #:    [id number]       Weight:       127.2 lb Date of Birth:  23-Mar-1951        BSA:          1.577 m Patient Age:    71 years         BP:           120/60 mmHg Patient Gender: F                HR:           69 bpm. Exam Location:  [address] Procedure: Limited Echo, Cardiac Doppler, Limited Color Doppler and Intracardiac            Opacification Agent Indications:    I50.21 Acute systolic (congestive) heart failure  History:        Patient has prior history of Echocardiogram examinations, most                 recent 11/23/2022. Cardiomyopathy, Arrythmias:LBBB; Risk                 Factors:Hypertension. Lung cancer. Pulmonary emboli.  Sonographer:    Cathie Beams RCS Referring Phys: HAO MENG IMPRESSIONS  1. Left ventricular ejection fraction, by estimation, is 30 to 35%. The left ventricle has  moderately decreased function. The left ventricle demonstrates regional wall motion abnormalities. Abnormal (paradoxical) septal motion, consistent with left bundle branch block. There is mild left ventricular hypertrophy. Left ventricular diastolic parameters are consistent with Grade II diastolic dysfunction (pseudonormalization). Elevated left atrial pressure.  2. Right ventricular systolic function is normal. The right ventricular size is normal. Tricuspid regurgitation signal is inadequate for assessing PA pressure.  3. Left atrial size was moderately dilated.  4. The mitral valve is normal in structure. Trivial mitral valve regurgitation.  5. The aortic valve is tricuspid. Aortic valve regurgitation is mild to moderate. Aortic valve sclerosis is present, with no evidence of aortic valve stenosis.  6. The inferior vena cava is normal in size with greater than 50% respiratory variability, suggesting right atrial pressure of 3 mmHg. FINDINGS  Left Ventricle: Left ventricular ejection fraction, by estimation, is 30 to 35%. The left ventricle has moderately decreased function. The left ventricle demonstrates regional wall motion abnormalities. The left ventricular internal cavity size was normal in size. There is mild left ventricular hypertrophy. Abnormal (paradoxical) septal motion, consistent with left bundle branch block. Left ventricular diastolic parameters are consistent with Grade II diastolic dysfunction (pseudonormalization). Elevated left atrial pressure. Right Ventricle: The right ventricular size is normal. No increase in right ventricular wall thickness. Right ventricular systolic function is normal. Tricuspid regurgitation signal is inadequate for assessing PA pressure. Left Atrium: Left atrial size was moderately dilated. Pericardium: Trivial pericardial effusion is present. Mitral Valve: The mitral valve is normal in structure. Trivial mitral valve regurgitation. Aortic Valve: The aortic valve is  tricuspid. Aortic valve regurgitation is mild to moderate. Aortic valve sclerosis is present, with no evidence of aortic valve stenosis. Pulmonic Valve: The pulmonic valve was not well visualized. Pulmonic valve regurgitation is not visualized. Aorta: The aortic root and ascending aorta are structurally normal, with no evidence of dilitation. Venous: The inferior vena cava  is normal in size with greater than 50% respiratory variability, suggesting right atrial pressure of 3 mmHg. IAS/Shunts: The interatrial septum was not well visualized. LEFT VENTRICLE PLAX 2D LVIDd:         4.70 cm   Diastology LVIDs:         4.10 cm   LV e' medial:    5.00 cm/s LV PW:         1.10 cm   LV E/e' medial:  19.2 LV IVS:        1.00 cm   LV e' lateral:   7.29 cm/s LVOT diam:     2.00 cm   LV E/e' lateral: 13.2 LV SV:         61 LV SV Index:   39 LVOT Area:     3.14 cm  LEFT ATRIUM             Index LA diam:        3.20 cm 2.03 cm/m LA Vol (A2C):   81.3 ml 51.55 ml/m LA Vol (A4C):   56.5 ml 35.82 ml/m LA Biplane Vol: 68.0 ml 43.11 ml/m  AORTIC VALVE LVOT Vmax:   96.50 cm/s LVOT Vmean:  61.500 cm/s LVOT VTI:    0.194 m  AORTA Ao Root diam: 3.00 cm Ao Asc diam:  3.10 cm MITRAL VALVE MV Area (PHT): 3.58 cm     SHUNTS MV Decel Time: 212 msec     Systemic VTI:  0.19 m MV E velocity: 95.90 cm/s   Systemic Diam: 2.00 cm MV A velocity: 130.00 cm/s MV E/A ratio:  0.74 Epifanio Lesches MD Electronically signed by Epifanio Lesches MD Signature Date/Time: 02/15/2023/2:00:59 PM    Final     Microbiology: Results for orders placed or performed in visit on 02/21/19  Novel Coronavirus, NAA ([facility])     Status: None   Collection Time: 02/21/19  3:30 PM   Specimen: Nasopharyngeal(NP) swabs in vial transport medium   NASOPHARYNGE  TESTING  Result Value Ref Range Status   SARS-CoV-2, NAA Not Detected Not Detected Final    Comment: This nucleic acid amplification test was developed and its performance characteristics determined by  [employer]. Nucleic acid amplification tests include PCR and TMA. This test has not been FDA cleared or approved. This test has been authorized by FDA under an Emergency Use Authorization (EUA). This test is only authorized for the duration of time the declaration that circumstances exist justifying the authorization of the emergency use of in vitro diagnostic tests for detection of SARS-CoV-2 virus and/or diagnosis of COVID-19 infection under section 564(b)(1) of the Act, 21 U.S.C. 811BJY-7(W) (1), unless the authorization is terminated or revoked sooner. When diagnostic testing is negative, the possibility of a false negative result should be considered in the context of a patient's recent exposures and the presence of clinical signs and symptoms consistent with COVID-19. An individual without symptoms of COVID-19 and who is not shedding SARS-CoV-2 virus would  expect to have a negative (not detected) result in this assay.     Labs: CBC: Recent Labs  Lab 02/27/23 0735 02/27/23 1031 02/28/23 0424 03/01/23 0730  WBC 7.6 7.6 8.2 10.7*  NEUTROABS 6.2  --   --   --   HGB 5.5* 5.4* 8.7* 9.1*  HCT 17.4* 17.7* 27.2* 28.7*  MCV 87.0 88.1 87.5 87.2  PLT 332 376 304 300   Basic Metabolic Panel: Recent Labs  Lab 02/27/23 0735 02/27/23 1031 02/28/23 0424 03/01/23 0730  NA 136 135 136 131*  K 3.9 4.4 4.1 3.4*  CL 103 102 103 99  CO2 26 23 23 23   GLUCOSE 119* 89 94 122*  BUN 11 12 8 10   CREATININE 0.87 0.72 0.72 0.75  CALCIUM 8.6* 8.7* 8.6* 8.1*   Liver Function Tests: Recent Labs  Lab 02/27/23 0735 02/28/23 0424  AST 11* 14*  ALT 8 11  ALKPHOS 59 54  BILITOT 0.3 1.2  PROT 7.5 7.3  ALBUMIN 2.9* 2.3*   CBG: No results for input(s): "GLUCAP" in the last 168 hours.  Discharge time spent: greater than 30 minutes.  Signed: Thad Ranger, MD [facility] 03/01/2023

## 2023-03-01 NOTE — Plan of Care (Signed)

## 2023-03-02 ENCOUNTER — Encounter (HOSPITAL_COMMUNITY): Payer: Self-pay | Admitting: Internal Medicine

## 2023-03-03 NOTE — Progress Notes (Deleted)
[facility] OFFICE PROGRESS NOTE  [name], [name] [address] [place] [address]  DIAGNOSIS: Stage IV (T1c, N0, M1) Non-Small Cell Lung Cancer, adenocarcinoma. She presented with a spiculated left upper lobe lung nodule and large right adrenal gland mass. She was diagnosed in July 2024. She had molecular studies that showed she has KRASG12C which can be used in the second line setting.    PDL1: 1%  PRIOR THERAPY: SBRT to the left upper lobe and right adrenal targets under the care of Dr. Mitzi Hansen.  Last dose of treatment expected on 02/20/2023   CURRENT THERAPY:  INTERVAL HISTORY: Erin Pacheco 72 y.o. female returns to the clinic today for a follow up visit accompanied by her daughter. The patient established care in the clinic on 01/12/23. She was significaly anemic at that time. During the encounter, she started having flank pain. Of note, The patient had post biopsy hemorrhage of the adrenal biopsy within the mass extending into the right perinephric space. When She was seen in the ER, her work up was negative for acute etiology. They did not feel comfortable waiting for a blood transfusion the following day so they opted to go to the ER to evaluate her flank pain and receive a blood transfusion.   When she was last seen, we discussed that technically, the patient is stage IV given the metastatic adrenal lesion. Fortunately she does not have a high burden of disease. Dr. Arbutus Ped recommends referral to radiation oncology to see if she can receive radiation to the lung lesion and the adrenal gland lesion. She had been undergoing radiation and her last dose of treatment was on 02/20/23. The plan was to see her back today to discuss systemic treatment options. We requested molecular studies to determine systemic treatment options. Her molecular studies showed no actionable mutations. Her PDL1 is low at 1%   She then returned to the clinic to discuss treatment  options but she continued to be significantly anemic with *** a hbg of 5.5.  Therefore, she was subsequently sent to the emergency room and was admitted from 9/23 to 9/25.  She was FOBT positive in the emergency room.  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  There was a single nonbleeding angiodysplastic lesion that was treated in the stomach.  The patient received blood transfusion once and was discharged.  She is going to continue taking Protonix and she was cleared to restart her Eliquis.  She is scheduled to start IV iron infusions on ***  She reports that her breathing is "fine".  She denies any significant shortness of breath.  She does have a cough which produces clear phlegm for which she takes Mucinex and Claritin.  Denies any chest pain or hemoptysis.  She will have nausea "every once in a blue moon" without any vomiting. She is here for evaluation and for a more detailed discussion about her current condition and recommended treatment options.     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  MEDICATIONS:  Current Outpatient Medications  Medication Sig Dispense Refill  acetaminophen (TYLENOL) 325 MG tablet Take 2 tablets (650 mg total) by mouth every 6 (six) hours as needed for mild pain (or Fever >/= 101).     [START ON 03/04/2023] apixaban (ELIQUIS) 5 MG TABS tablet Take 1 tablet (5 mg total) by mouth 2 (two) times daily.     Fluticasone-Umeclidin-Vilant (TRELEGY ELLIPTA) 100-62.5-25 MCG/ACT AEPB Inhale 1 each into the lungs daily.     metoprolol succinate (TOPROL-XL) 50 MG 24 hr tablet Take 1 tablet (50 mg  total) by mouth daily. Take with or immediately following a meal. 30 tablet 1   Multiple Vitamin (MULTIVITAMIN) tablet Take 1 tablet by mouth daily.       ondansetron (ZOFRAN) 8 MG tablet Take 1 tablet (8 mg total) by mouth every 8 (eight) hours as needed for nausea or vomiting. 30 tablet 0   oxyCODONE (OXY IR/ROXICODONE) 5 MG immediate release tablet Take 1 tablet (5 mg total) by mouth every 6 (six) hours as needed for moderate pain. 60 tablet 0   pantoprazole (PROTONIX) 40 MG tablet Take 1 tablet (40 mg total) by mouth daily before breakfast. 30 tablet 3   Polyethyl Glycol-Propyl Glycol (SYSTANE OP) Place 1 drop into both eyes daily as needed (dry eye).     sacubitril-valsartan (ENTRESTO) 49-51 MG Take 1 tablet by mouth 2 (two) times daily. 60 tablet 5   spironolactone (ALDACTONE) 25 MG tablet Take 0.5 tablets (12.5 mg total) by mouth daily. 30 tablet 1   No current facility-administered medications for this visit.    SURGICAL HISTORY:  Past Surgical History:  Procedure Laterality Date   ABDOMINAL HYSTERECTOMY     BILATERAL OOPHORECTOMY     BIOPSY  12/09/2022   Procedure: BIOPSY;  Surgeon: Imogene Burn, MD;  Location: [facility] ENDOSCOPY;  Service: Gastroenterology;;   BRONCHIAL BIOPSY  01/09/2023   Procedure: BRONCHIAL BIOPSIES;  Surgeon: Josephine Igo, DO;  Location: [facility] ENDOSCOPY;  Service: Pulmonary;;   BRONCHIAL NEEDLE ASPIRATION BIOPSY  01/09/2023   Procedure: BRONCHIAL NEEDLE ASPIRATION BIOPSIES;  Surgeon: Josephine Igo, DO;  Location: [facility] ENDOSCOPY;  Service: Pulmonary;;   CATARACT EXTRACTION W/PHACO  12/27/2010   Procedure: CATARACT EXTRACTION PHACO AND INTRAOCULAR LENS PLACEMENT (IOC);  Surgeon: Gemma Payor;  Location: [facility] ORS;  Service: Ophthalmology;  Laterality: Right;   CATARACT EXTRACTION W/PHACO  03/28/2011   Procedure: CATARACT EXTRACTION PHACO AND INTRAOCULAR LENS PLACEMENT (IOC);  Surgeon: Gemma Payor;  Location: [facility] ORS;  Service: Ophthalmology;  Laterality: Left;  CDE 7.27    ENTEROSCOPY N/A 02/28/2023   Procedure: ENTEROSCOPY;  Surgeon: Iva Boop, MD;  Location: [facility] ENDOSCOPY;  Service: Gastroenterology;  Laterality: N/A;   ESOPHAGOGASTRODUODENOSCOPY (EGD) WITH PROPOFOL N/A 12/09/2022   Procedure: ESOPHAGOGASTRODUODENOSCOPY (EGD) WITH PROPOFOL;  Surgeon: Imogene Burn, MD;  Location: [facility] ENDOSCOPY;  Service: Gastroenterology;  Laterality: N/A;   HOT HEMOSTASIS N/A 02/28/2023   Procedure: HOT HEMOSTASIS (ARGON PLASMA COAGULATION/BICAP);  Surgeon: Iva Boop, MD;  Location: [facility] ENDOSCOPY;  Service: Gastroenterology;  Laterality: N/A;   SUBMUCOSAL TATTOO INJECTION  02/28/2023   Procedure: SUBMUCOSAL TATTOO INJECTION;  Surgeon: Iva Boop, MD;  Location: [facility] ENDOSCOPY;  Service: Gastroenterology;;    REVIEW OF SYSTEMS:   Review of Systems  Constitutional: Negative for appetite change, chills, fatigue, fever and unexpected weight change.  HENT:   Negative for mouth sores, nosebleeds, sore throat and trouble swallowing.   Eyes: Negative for eye problems and icterus.  Respiratory: Negative for cough, hemoptysis, shortness of breath and wheezing.  Cardiovascular: Negative for chest pain and leg swelling.  Gastrointestinal: Negative for abdominal pain, constipation, diarrhea, nausea and vomiting.  Genitourinary: Negative for bladder incontinence, difficulty urinating, dysuria, frequency and hematuria.   Musculoskeletal: Negative for back pain, gait problem, neck pain and neck stiffness.  Skin: Negative for itching and rash.  Neurological: Negative for dizziness, extremity weakness, gait problem, headaches, light-headedness and seizures.  Hematological: Negative for adenopathy. Does not bruise/bleed easily.  Psychiatric/Behavioral: Negative for confusion, depression and sleep disturbance. The patient is not nervous/anxious.     PHYSICAL EXAMINATION:  There were no vitals taken for this visit.  ECOG PERFORMANCE STATUS: {CHL ONC ECOG [id number]  Physical  Exam  Constitutional: Oriented to person, place, and time and well-developed, well-nourished, and in no distress. No distress.  HENT:  Head: Normocephalic and atraumatic.  Mouth/Throat: Oropharynx is clear and moist. No oropharyngeal exudate.  Eyes: Conjunctivae are normal. Right eye exhibits no discharge. Left eye exhibits no discharge. No scleral icterus.  Neck: Normal range of motion. Neck supple.  Cardiovascular: Normal rate, regular rhythm, normal heart sounds and intact distal pulses.   Pulmonary/Chest: Effort normal and breath sounds normal. No respiratory distress. No wheezes. No rales.  Abdominal: Soft. Bowel sounds are normal. Exhibits no distension and no mass. There is no tenderness.  Musculoskeletal: Normal range of motion. Exhibits no edema.  Lymphadenopathy:    No cervical adenopathy.  Neurological: Alert and oriented to person, place, and time. Exhibits normal muscle tone. Gait normal. Coordination normal.  Skin: Skin is warm and dry. No rash noted. Not diaphoretic. No erythema. No pallor.  Psychiatric: Mood, memory and judgment normal.  Vitals reviewed.  LABORATORY DATA: Lab Results  Component Value Date   WBC 10.7 (H) 03/01/2023   HGB 9.1 (L) 03/01/2023   HCT 28.7 (L) 03/01/2023   MCV 87.2 03/01/2023   PLT 300 03/01/2023      Chemistry      Component Value Date/Time   NA 131 (L) 03/01/2023 0730   K 3.4 (L) 03/01/2023 0730   CL 99 03/01/2023 0730   CO2 23 03/01/2023 0730   BUN 10 03/01/2023 0730   CREATININE 0.75 03/01/2023 0730   CREATININE 0.87 02/27/2023 0735      Component Value Date/Time   CALCIUM 8.1 (L) 03/01/2023 0730   ALKPHOS 54 02/28/2023 0424   AST 14 (L) 02/28/2023 0424   AST 11 (L) 02/27/2023 0735   ALT 11 02/28/2023 0424   ALT 8 02/27/2023 0735   BILITOT 1.2 02/28/2023 0424   BILITOT 0.3 02/27/2023 0735       RADIOGRAPHIC STUDIES:  ECHOCARDIOGRAM LIMITED  Result Date: 02/15/2023    ECHOCARDIOGRAM LIMITED REPORT   Patient Name:    Erin Pacheco Date of Exam: 02/15/2023 Medical Rec #:  [id number]        Height:       62.0 in Accession #:    [id number]       Weight:       127.2 lb Date of Birth:  February 10, 1951        BSA:          1.577 m Patient Age:    71 years         BP:           120/60 mmHg Patient Gender: F                HR:           69 bpm. Exam Location:  [facility]  [address] Procedure: Limited Echo, Cardiac Doppler, Limited Color Doppler and Intracardiac            Opacification Agent Indications:    I50.21 Acute systolic (congestive) heart failure  History:        Patient has prior history of Echocardiogram examinations, most                 recent 11/23/2022. Cardiomyopathy, Arrythmias:LBBB; Risk                 Factors:Hypertension. Lung cancer. Pulmonary emboli.  Sonographer:    Cathie Beams RCS Referring Phys: HAO MENG IMPRESSIONS  1. Left ventricular ejection fraction, by estimation, is 30 to 35%. The left ventricle has moderately decreased function. The left ventricle demonstrates regional wall motion abnormalities. Abnormal (paradoxical) septal motion, consistent with left bundle branch block. There is mild left ventricular hypertrophy. Left ventricular diastolic parameters are consistent with Grade II diastolic dysfunction (pseudonormalization). Elevated left atrial pressure.  2. Right ventricular systolic function is normal. The right ventricular size is normal. Tricuspid regurgitation signal is inadequate for assessing PA pressure.  3. Left atrial size was moderately dilated.  4. The mitral valve is normal in structure. Trivial mitral valve regurgitation.  5. The aortic valve is tricuspid. Aortic valve regurgitation is mild to moderate. Aortic valve sclerosis is present, with no evidence of aortic valve stenosis.  6. The inferior vena cava is normal in size with greater than 50% respiratory variability, suggesting right atrial pressure of 3 mmHg. FINDINGS  Left Ventricle: Left ventricular ejection fraction, by estimation, is 30  to 35%. The left ventricle has moderately decreased function. The left ventricle demonstrates regional wall motion abnormalities. The left ventricular internal cavity size was normal in size. There is mild left ventricular hypertrophy. Abnormal (paradoxical) septal motion, consistent with left bundle branch block. Left ventricular diastolic parameters are consistent with Grade II diastolic dysfunction (pseudonormalization). Elevated left atrial pressure. Right Ventricle: The right ventricular size is normal. No increase in right ventricular wall thickness. Right ventricular systolic function is normal. Tricuspid regurgitation signal is inadequate for assessing PA pressure. Left Atrium: Left atrial size was moderately dilated. Pericardium: Trivial pericardial effusion is present. Mitral Valve: The mitral valve is normal in structure. Trivial mitral valve regurgitation. Aortic Valve: The aortic valve is tricuspid. Aortic valve regurgitation is mild to moderate. Aortic valve sclerosis is present, with no evidence of aortic valve stenosis. Pulmonic Valve: The pulmonic valve was not well visualized. Pulmonic valve regurgitation is not visualized. Aorta: The aortic root and ascending aorta are structurally normal, with no evidence of dilitation. Venous: The inferior vena cava is normal in size with greater than 50% respiratory variability, suggesting right atrial pressure of 3 mmHg. IAS/Shunts: The interatrial septum was not well visualized. LEFT VENTRICLE PLAX 2D LVIDd:         4.70 cm   Diastology LVIDs:         4.10 cm   LV e' medial:    5.00 cm/s LV PW:         1.10 cm   LV E/e' medial:  19.2 LV IVS:        1.00 cm   LV e' lateral:   7.29 cm/s LVOT diam:     2.00 cm   LV E/e' lateral: 13.2 LV SV:         61 LV SV Index:   39 LVOT Area:     3.14 cm  LEFT ATRIUM  Index LA diam:        3.20 cm 2.03 cm/m LA Vol (A2C):   81.3 ml 51.55 ml/m LA Vol (A4C):   56.5 ml 35.82 ml/m LA Biplane Vol: 68.0 ml 43.11  ml/m  AORTIC VALVE LVOT Vmax:   96.50 cm/s LVOT Vmean:  61.500 cm/s LVOT VTI:    0.194 m  AORTA Ao Root diam: 3.00 cm Ao Asc diam:  3.10 cm MITRAL VALVE MV Area (PHT): 3.58 cm     SHUNTS MV Decel Time: 212 msec     Systemic VTI:  0.19 m MV E velocity: 95.90 cm/s   Systemic Diam: 2.00 cm MV A velocity: 130.00 cm/s MV E/A ratio:  0.74 Epifanio Lesches MD Electronically signed by Epifanio Lesches MD Signature Date/Time: 02/15/2023/2:00:59 PM    Final      ASSESSMENT/PLAN:  This is a very pleasant 72 year old female with stage IV (T1c, N0, M1) Non-Small Cell Lung Cancer, adenocarcinoma. She presented with a spiculated left upper lobe lung nodule and large right adrenal gland mass. She was diagnosed in July 2024. She had molecular studies that showed she has KRASG12C which can be used in the second line setting.  PDL1 1%   She underwent radiation to the adrenal lesion and the lung under the care of Dr. Mitzi Hansen. The last dose was on 02/20/23  Dr. Arbutus Ped had a lengthly discussion with the patient today about her current condition and treatment options. The patient was given the option of a referral to hospice/palliative vs. Treatment with systemic chemotherapy with carboplatin for an AUC of 5, Alimta 500 mg/m, and Keytruda 200 mg IV every 3 weeks.  The patient is interested in proceeding with systemic chemotherapy.  She is expected to start her first dose of this treatment on __.  We discussed the adverse side effects of treatment including but not limited to alopecia, myelosuppression, nausea and vomiting, peripheral neuropathy, liver or renal dysfunction as well as immunotherapy mediated adverse effects.   I will arrange for the patient to have a chemoeducation class prior to receiving her first cycle of chemotherapy.   We will arrange for the patient to have a B12 injection while in the clinic today.     I sent prescriptions for 1 mg folic acid p.o. daily as well as Compazine 10 mg every 6 hours  as needed for nausea.   The patient will follow-up in 2 weeks for a one-week follow-up visit after completing her first cycle of chemotherapy.   She will continue her eliquis for her hx of PE.   ***sample to blood bank  Protonix ***  The patient was advised to call immediately if she has any concerning symptoms in the interval. The patient voices understanding of current disease status and treatment options and is in agreement with the current care plan. All questions were answered. The patient knows to call the clinic with any problems, questions or concerns. We can certainly see the patient much sooner if necessary       No orders of the defined types were placed in this encounter.    I spent {CHL ONC TIME VISIT - [id number]} counseling the patient face to face. The total time spent in the appointment was {CHL ONC TIME VISIT - QMVHQ:[id number]}.  Na Waldrip L Jamai Dolce, [name] 03/03/23

## 2023-03-06 ENCOUNTER — Ambulatory Visit: Payer: Medicare HMO

## 2023-03-07 ENCOUNTER — Telehealth: Payer: Self-pay | Admitting: Cardiology

## 2023-03-07 ENCOUNTER — Inpatient Hospital Stay: Payer: Medicare HMO

## 2023-03-07 ENCOUNTER — Ambulatory Visit: Payer: Medicare HMO | Admitting: Physician Assistant

## 2023-03-07 ENCOUNTER — Telehealth: Payer: Self-pay | Admitting: Physician Assistant

## 2023-03-07 ENCOUNTER — Ambulatory Visit: Payer: Medicare HMO

## 2023-03-07 DIAGNOSIS — I2694 Multiple subsegmental pulmonary emboli without acute cor pulmonale: Secondary | ICD-10-CM

## 2023-03-07 NOTE — Telephone Encounter (Signed)
The patient was scheduled to see me at 230 today with a 2:00 lab appointment.  I called her and she was unaware of her appointment and thought it was a different day.  She asked that I call her daughter [name] to coordinate rescheduling.  I called [name] but she did not answer and her voicemail was full.  I will try calling her later.  We would ideally like to see the patient tomorrow or Thursday this week.  I will try calling again later.

## 2023-03-07 NOTE — Telephone Encounter (Signed)
*  STAT* If patient is at the pharmacy, call can be transferred to refill team.   1. Which medications need to be refilled? (please list name of each medication and dose if known)   apixaban (ELIQUIS) 5 MG TABS tablet   2. Would you like to learn more about the convenience, safety, & potential cost savings by using the [facility]?   3. Are you open to using the [facility] (Type [facility]. ).  4. Which pharmacy/location (including street and city if local pharmacy) is medication to be sent to?  [id number] - [place], [place] - [address] AT [facility]   5. Do they need a 30 day or 90 day supply?   30 day  Daughter [name]) stated patient only has a couple of days left of this medication.  Daughter stated they are in the process of transferring patient's medication back to [id number] - [place],  - [address] AT [facility].

## 2023-03-08 ENCOUNTER — Other Ambulatory Visit: Payer: Medicare HMO

## 2023-03-08 ENCOUNTER — Inpatient Hospital Stay: Payer: Medicare HMO

## 2023-03-08 ENCOUNTER — Other Ambulatory Visit: Payer: Self-pay | Admitting: Internal Medicine

## 2023-03-08 ENCOUNTER — Ambulatory Visit: Payer: Medicare HMO | Admitting: Physician Assistant

## 2023-03-08 ENCOUNTER — Inpatient Hospital Stay: Payer: Medicare HMO | Attending: Physician Assistant | Admitting: Internal Medicine

## 2023-03-08 ENCOUNTER — Encounter: Payer: Self-pay | Admitting: Internal Medicine

## 2023-03-08 ENCOUNTER — Other Ambulatory Visit: Payer: Self-pay | Admitting: Physician Assistant

## 2023-03-08 VITALS — BP 121/67 | HR 79 | Temp 98.3°F | Resp 16 | Ht 63.0 in | Wt 120.4 lb

## 2023-03-08 DIAGNOSIS — Z5111 Encounter for antineoplastic chemotherapy: Secondary | ICD-10-CM | POA: Insufficient documentation

## 2023-03-08 DIAGNOSIS — C7971 Secondary malignant neoplasm of right adrenal gland: Secondary | ICD-10-CM | POA: Diagnosis not present

## 2023-03-08 DIAGNOSIS — Z79620 Long term (current) use of immunosuppressive biologic: Secondary | ICD-10-CM | POA: Diagnosis not present

## 2023-03-08 DIAGNOSIS — E278 Other specified disorders of adrenal gland: Secondary | ICD-10-CM

## 2023-03-08 DIAGNOSIS — R918 Other nonspecific abnormal finding of lung field: Secondary | ICD-10-CM

## 2023-03-08 DIAGNOSIS — C3412 Malignant neoplasm of upper lobe, left bronchus or lung: Secondary | ICD-10-CM

## 2023-03-08 DIAGNOSIS — Z5112 Encounter for antineoplastic immunotherapy: Secondary | ICD-10-CM | POA: Insufficient documentation

## 2023-03-08 DIAGNOSIS — C3492 Malignant neoplasm of unspecified part of left bronchus or lung: Secondary | ICD-10-CM

## 2023-03-08 DIAGNOSIS — D509 Iron deficiency anemia, unspecified: Secondary | ICD-10-CM | POA: Diagnosis present

## 2023-03-08 LAB — CMP (CANCER CENTER ONLY)
ALT: 12 U/L (ref 0–44)
AST: 17 U/L (ref 15–41)
Albumin: 2.9 g/dL — ABNORMAL LOW (ref 3.5–5.0)
Alkaline Phosphatase: 75 U/L (ref 38–126)
Anion gap: 7 (ref 5–15)
BUN: 9 mg/dL (ref 8–23)
CO2: 25 mmol/L (ref 22–32)
Calcium: 8.9 mg/dL (ref 8.9–10.3)
Chloride: 102 mmol/L (ref 98–111)
Creatinine: 0.78 mg/dL (ref 0.44–1.00)
GFR, Estimated: >60 mL/min (ref >60)
Glucose, Bld: 123 mg/dL — ABNORMAL HIGH (ref 70–99)
Potassium: 3.3 mmol/L — ABNORMAL LOW (ref 3.5–5.1)
Sodium: 134 mmol/L — ABNORMAL LOW (ref 135–145)
Total Bilirubin: 0.3 mg/dL (ref 0.3–1.2)
Total Protein: 7.6 g/dL (ref 6.5–8.1)

## 2023-03-08 LAB — CBC WITH DIFFERENTIAL (CANCER CENTER ONLY)
Abs Immature Granulocytes: 0.02 10*3/uL (ref 0.00–0.07)
Basophils Absolute: 0.1 10*3/uL (ref 0.0–0.1)
Basophils Relative: 1 %
Eosinophils Absolute: 0 10*3/uL (ref 0.0–0.5)
Eosinophils Relative: 1 %
HCT: 29.6 % — ABNORMAL LOW (ref 36.0–46.0)
Hemoglobin: 9.4 g/dL — ABNORMAL LOW (ref 12.0–15.0)
Immature Granulocytes: 0 %
Lymphocytes Relative: 7 %
Lymphs Abs: 0.4 10*3/uL — ABNORMAL LOW (ref 0.7–4.0)
MCH: 27.4 pg (ref 26.0–34.0)
MCHC: 31.8 g/dL (ref 30.0–36.0)
MCV: 86.3 fL (ref 80.0–100.0)
Monocytes Absolute: 0.5 10*3/uL (ref 0.1–1.0)
Monocytes Relative: 10 %
Neutro Abs: 4.3 10*3/uL (ref 1.7–7.7)
Neutrophils Relative %: 81 %
Platelet Count: 328 10*3/uL (ref 150–400)
RBC: 3.43 MIL/uL — ABNORMAL LOW (ref 3.87–5.11)
RDW: 17.6 % — ABNORMAL HIGH (ref 11.5–15.5)
WBC Count: 5.3 10*3/uL (ref 4.0–10.5)
nRBC: 0 % (ref 0.0–0.2)

## 2023-03-08 LAB — SAMPLE TO BLOOD BANK

## 2023-03-08 MED ORDER — PROCHLORPERAZINE MALEATE 10 MG PO TABS
10.0000 mg | ORAL_TABLET | Freq: Four times a day (QID) | ORAL | 1 refills | Status: AC | PRN
Start: 2023-03-13 — End: ?

## 2023-03-08 MED ORDER — APIXABAN 5 MG PO TABS: 5.0000 mg | ORAL_TABLET | Freq: Two times a day (BID) | ORAL | 5 refills | Status: DC

## 2023-03-08 MED ORDER — LIDOCAINE-PRILOCAINE 2.5-2.5 % EX CREA: TOPICAL_CREAM | CUTANEOUS | 0 refills | Status: DC

## 2023-03-08 MED ORDER — CYANOCOBALAMIN 1000 MCG/ML IJ SOLN
1000.0000 ug | Freq: Once | INTRAMUSCULAR | Status: AC
  Administered 2023-03-08: 1000 ug via INTRAMUSCULAR
  Filled 2023-03-08: qty 1

## 2023-03-08 MED ORDER — FOLIC ACID 1 MG PO TABS: 1.0000 mg | ORAL_TABLET | Freq: Every day | ORAL | 3 refills | Status: DC

## 2023-03-08 NOTE — Progress Notes (Signed)
[facility] [phone]   [phone]  OFFICE PROGRESS NOTE  [name], [name] [address] [place] [address]  DIAGNOSIS:  Stage IV (T1c, N0, M1b) Non-Small Cell Lung Cancer, adenocarcinoma. Erin Pacheco presented with a spiculated left upper lobe lung nodule and large right adrenal gland mass. Erin Pacheco was diagnosed in July 2024. Erin Pacheco had molecular studies that showed Erin Pacheco has KRASG12C which can be used in the second line setting.    PDL1: 1%   PRIOR THERAPY: SBRT to the left upper lobe and right adrenal targets under the care of Dr. Mitzi Hansen.  Last dose of treatment expected on 02/20/2023   CURRENT THERAPY: Systemic chemotherapy with carboplatin for AUC of 5, Alimta 500 Mg/M2 and Keytruda 200 Mg IV every 3 weeks.  First dose March 22, 2023.  INTERVAL HISTORY: Erin Pacheco 72 y.o. female returns to the clinic today for follow-up visit accompanied by her daughter. Discussed the use of AI scribe software for clinical note transcription with the patient, who gave verbal consent to proceed.  History of Present Illness   Erin Pacheco, a 72 year old African American individual, was diagnosed with stage four non-small cell lung cancer adenocarcinoma in July 2024. Erin Pacheco was found to have a KRAS G12C mutation and 1% PD-L1 expression. Recently, Erin Pacheco has been dealing with iron deficiency anemia, requiring blood transfusions. Since her last visit, Erin Pacheco reports feeling well overall. However, Erin Pacheco has developed a new dry cough over the past three days, without any associated hemoptysis. Erin Pacheco denies any shortness of breath or chest pain. Erin Pacheco has not lost any weight and, in fact, has gained two pounds since her last visit.  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 These lesions were catheterized, and her stool has since returned to normal, no longer bloody or dark. Her hemoglobin has improved to  9.4 from 5.4 at her last visit.  Erin Pacheco has not yet started any treatment for her cancer. Erin Pacheco is accepting of treatment and is scheduled to receive a vitamin B12 injection and start on folic acid.       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  MEDICATIONS:  Current Outpatient Medications  Medication Sig Dispense Refill   acetaminophen (TYLENOL) 325 MG tablet Take 2 tablets (650 mg total) by mouth every 6 (six) hours as needed for mild pain (or Fever >/= 101).     apixaban (ELIQUIS) 5 MG TABS tablet Take 1 tablet (5 mg total) by mouth 2 (two) times daily. 60 tablet 5   Fluticasone-Umeclidin-Vilant (TRELEGY ELLIPTA) 100-62.5-25 MCG/ACT AEPB Inhale 1 each into the lungs daily.     metoprolol succinate (TOPROL-XL) 50 MG 24 hr tablet Take 1 tablet (50 mg total) by mouth daily. Take with or immediately following a meal. 30 tablet 1   Multiple Vitamin (MULTIVITAMIN) tablet Take 1 tablet by mouth daily.       ondansetron (ZOFRAN) 8 MG tablet Take 1 tablet (8 mg total) by mouth every 8 (eight) hours as needed for nausea or vomiting. 30 tablet 0   oxyCODONE (OXY IR/ROXICODONE) 5 MG immediate  release tablet Take 1 tablet (5 mg total) by mouth every 6 (six) hours as needed for moderate pain. 60 tablet 0   pantoprazole (PROTONIX) 40 MG tablet Take 1 tablet (40 mg total) by mouth daily before breakfast. 30 tablet 3   Polyethyl Glycol-Propyl Glycol (SYSTANE OP) Place 1 drop into both eyes daily as needed (dry eye).     sacubitril-valsartan (ENTRESTO) 49-51 MG Take 1 tablet by mouth 2 (two) times daily. 60 tablet 5   spironolactone (ALDACTONE) 25 MG tablet Take 0.5 tablets (12.5 mg total) by mouth daily. 30 tablet 1   No current  facility-administered medications for this visit.    SURGICAL HISTORY:  Past Surgical History:  Procedure Laterality Date   ABDOMINAL HYSTERECTOMY     BILATERAL OOPHORECTOMY     BIOPSY  12/09/2022   Procedure: BIOPSY;  Surgeon: Imogene Burn, MD;  Location: [facility] ENDOSCOPY;  Service: Gastroenterology;;   BRONCHIAL BIOPSY  01/09/2023   Procedure: BRONCHIAL BIOPSIES;  Surgeon: Josephine Igo, DO;  Location: [facility] ENDOSCOPY;  Service: Pulmonary;;   BRONCHIAL NEEDLE ASPIRATION BIOPSY  01/09/2023   Procedure: BRONCHIAL NEEDLE ASPIRATION BIOPSIES;  Surgeon: Josephine Igo, DO;  Location: [facility] ENDOSCOPY;  Service: Pulmonary;;   CATARACT EXTRACTION W/PHACO  12/27/2010   Procedure: CATARACT EXTRACTION PHACO AND INTRAOCULAR LENS PLACEMENT (IOC);  Surgeon: Gemma Payor;  Location: [facility] ORS;  Service: Ophthalmology;  Laterality: Right;   CATARACT EXTRACTION W/PHACO  03/28/2011   Procedure: CATARACT EXTRACTION PHACO AND INTRAOCULAR LENS PLACEMENT (IOC);  Surgeon: Gemma Payor;  Location: [facility] ORS;  Service: Ophthalmology;  Laterality: Left;  CDE 7.27   ENTEROSCOPY N/A 02/28/2023   Procedure: ENTEROSCOPY;  Surgeon: Iva Boop, MD;  Location: [facility] ENDOSCOPY;  Service: Gastroenterology;  Laterality: N/A;   ESOPHAGOGASTRODUODENOSCOPY (EGD) WITH PROPOFOL N/A 12/09/2022   Procedure: ESOPHAGOGASTRODUODENOSCOPY (EGD) WITH PROPOFOL;  Surgeon: Imogene Burn, MD;  Location: [facility] ENDOSCOPY;  Service: Gastroenterology;  Laterality: N/A;   HOT HEMOSTASIS N/A 02/28/2023   Procedure: HOT HEMOSTASIS (ARGON PLASMA COAGULATION/BICAP);  Surgeon: Iva Boop, MD;  Location: [facility];  Service: Gastroenterology;  Laterality: N/A;   SUBMUCOSAL TATTOO INJECTION  02/28/2023   Procedure: SUBMUCOSAL TATTOO INJECTION;  Surgeon: Iva Boop, MD;  Location: [facility] ENDOSCOPY;  Service: Gastroenterology;;    REVIEW OF SYSTEMS:  Constitutional: positive for fatigue Eyes: negative Ears, nose, mouth, throat, and face: negative Respiratory:  negative Cardiovascular: negative Gastrointestinal: negative Genitourinary:negative Integument/breast: negative Hematologic/lymphatic: negative Musculoskeletal:negative Neurological: negative Behavioral/Psych: negative Endocrine: negative Allergic/Immunologic: negative   PHYSICAL EXAMINATION: General appearance: alert, cooperative, fatigued, and no distress Head: Normocephalic, without obvious abnormality, atraumatic Neck: no adenopathy, no JVD, supple, symmetrical, trachea midline, and thyroid not enlarged, symmetric, no tenderness/mass/nodules Lymph nodes: Cervical, supraclavicular, and axillary nodes normal. Resp: clear to auscultation bilaterally Back: symmetric, no curvature. ROM normal. No CVA tenderness. Cardio: regular rate and rhythm, S1, S2 normal, no murmur, click, rub or gallop GI: soft, non-tender; bowel sounds normal; no masses,  no organomegaly Extremities: extremities normal, atraumatic, no cyanosis or edema Neurologic: Alert and oriented X 3, normal strength and tone. Normal symmetric reflexes. Normal coordination and gait  ECOG PERFORMANCE STATUS: 1 - Symptomatic but completely ambulatory  Blood pressure 121/67, pulse 79, temperature 98.3 F (36.8 C), temperature source Oral, resp. rate 16, height 5\' 3"  (1.6 m), weight 120 lb 6.4 oz (54.6 kg), SpO2 99%.  LABORATORY DATA: Lab Results  Component Value Date   WBC 5.3 03/08/2023   HGB 9.4 (L) 03/08/2023   HCT 29.6 (  L) 03/08/2023   MCV 86.3 03/08/2023   PLT 328 03/08/2023      Chemistry      Component Value Date/Time   NA 131 (L) 03/01/2023 0730   K 3.4 (L) 03/01/2023 0730   CL 99 03/01/2023 0730   CO2 23 03/01/2023 0730   BUN 10 03/01/2023 0730   CREATININE 0.75 03/01/2023 0730   CREATININE 0.87 02/27/2023 0735      Component Value Date/Time   CALCIUM 8.1 (L) 03/01/2023 0730   ALKPHOS 54 02/28/2023 0424   AST 14 (L) 02/28/2023 0424   AST 11 (L) 02/27/2023 0735   ALT 11 02/28/2023 0424   ALT 8  02/27/2023 0735   BILITOT 1.2 02/28/2023 0424   BILITOT 0.3 02/27/2023 0735       RADIOGRAPHIC STUDIES: ECHOCARDIOGRAM LIMITED  Result Date: 02/15/2023    ECHOCARDIOGRAM LIMITED REPORT   Patient Name:   SOREN LANGWORTHY Date of Exam: 02/15/2023 Medical Rec #:  [id number]        Height:       62.0 in Accession #:    [id number]       Weight:       127.2 lb Date of Birth:  1950/09/05        BSA:          1.577 m Patient Age:    71 years         BP:           120/60 mmHg Patient Gender: F                HR:           69 bpm. Exam Location:  [address] Procedure: Limited Echo, Cardiac Doppler, Limited Color Doppler and Intracardiac            Opacification Agent Indications:    I50.21 Acute systolic (congestive) heart failure  History:        Patient has prior history of Echocardiogram examinations, most                 recent 11/23/2022. Cardiomyopathy, Arrythmias:LBBB; Risk                 Factors:Hypertension. Lung cancer. Pulmonary emboli.  Sonographer:    Cathie Beams RCS Referring Phys: HAO MENG IMPRESSIONS  1. Left ventricular ejection fraction, by estimation, is 30 to 35%. The left ventricle has moderately decreased function. The left ventricle demonstrates regional wall motion abnormalities. Abnormal (paradoxical) septal motion, consistent with left bundle branch block. There is mild left ventricular hypertrophy. Left ventricular diastolic parameters are consistent with Grade II diastolic dysfunction (pseudonormalization). Elevated left atrial pressure.  2. Right ventricular systolic function is normal. The right ventricular size is normal. Tricuspid regurgitation signal is inadequate for assessing PA pressure.  3. Left atrial size was moderately dilated.  4. The mitral valve is normal in structure. Trivial mitral valve regurgitation.  5. The aortic valve is tricuspid. Aortic valve regurgitation is mild to moderate. Aortic valve sclerosis is present, with no evidence of aortic valve stenosis.  6. The  inferior vena cava is normal in size with greater than 50% respiratory variability, suggesting right atrial pressure of 3 mmHg. FINDINGS  Left Ventricle: Left ventricular ejection fraction, by estimation, is 30 to 35%. The left ventricle has moderately decreased function. The left ventricle demonstrates regional wall motion abnormalities. The left ventricular internal cavity size was normal in size. There is mild left ventricular hypertrophy. Abnormal (paradoxical) septal motion,  consistent with left bundle branch block. Left ventricular diastolic parameters are consistent with Grade II diastolic dysfunction (pseudonormalization). Elevated left atrial pressure. Right Ventricle: The right ventricular size is normal. No increase in right ventricular wall thickness. Right ventricular systolic function is normal. Tricuspid regurgitation signal is inadequate for assessing PA pressure. Left Atrium: Left atrial size was moderately dilated. Pericardium: Trivial pericardial effusion is present. Mitral Valve: The mitral valve is normal in structure. Trivial mitral valve regurgitation. Aortic Valve: The aortic valve is tricuspid. Aortic valve regurgitation is mild to moderate. Aortic valve sclerosis is present, with no evidence of aortic valve stenosis. Pulmonic Valve: The pulmonic valve was not well visualized. Pulmonic valve regurgitation is not visualized. Aorta: The aortic root and ascending aorta are structurally normal, with no evidence of dilitation. Venous: The inferior vena cava is normal in size with greater than 50% respiratory variability, suggesting right atrial pressure of 3 mmHg. IAS/Shunts: The interatrial septum was not well visualized. LEFT VENTRICLE PLAX 2D LVIDd:         4.70 cm   Diastology LVIDs:         4.10 cm   LV e' medial:    5.00 cm/s LV PW:         1.10 cm   LV E/e' medial:  19.2 LV IVS:        1.00 cm   LV e' lateral:   7.29 cm/s LVOT diam:     2.00 cm   LV E/e' lateral: 13.2 LV SV:         61 LV  SV Index:   39 LVOT Area:     3.14 cm  LEFT ATRIUM             Index LA diam:        3.20 cm 2.03 cm/m LA Vol (A2C):   81.3 ml 51.55 ml/m LA Vol (A4C):   56.5 ml 35.82 ml/m LA Biplane Vol: 68.0 ml 43.11 ml/m  AORTIC VALVE LVOT Vmax:   96.50 cm/s LVOT Vmean:  61.500 cm/s LVOT VTI:    0.194 m  AORTA Ao Root diam: 3.00 cm Ao Asc diam:  3.10 cm MITRAL VALVE MV Area (PHT): 3.58 cm     SHUNTS MV Decel Time: 212 msec     Systemic VTI:  0.19 m MV E velocity: 95.90 cm/s   Systemic Diam: 2.00 cm MV A velocity: 130.00 cm/s MV E/A ratio:  0.74 Epifanio Lesches MD Electronically signed by Epifanio Lesches MD Signature Date/Time: 02/15/2023/2:00:59 PM    Final     ASSESSMENT AND PLAN: This is a very pleasant 72 years old African-American female recently diagnosed with a stage IV (T1c, N0, M1b) non-small cell lung cancer, adenocarcinoma with positive KRAS G12C mutation and PD-L1 expression of 1%. Assessment and Plan    Stage 4 Non-Small Cell Lung Cancer (Adenocarcinoma) (T1c,N0, M1b) with KRAS G12C mutation Patient has been managing anemia and has not yet started treatment for cancer. Recent improvement in anemia and hemoglobin levels (9.4) allows for initiation of cancer treatment. -Start chemotherapy regimen with Carboplatin, Alimta, and Pembrolizumab. -Schedule first chemotherapy session for October 9th, 2024. -Arrange for chemotherapy education class prior to treatment initiation. -Follow-up one week after first treatment to assess tolerance.  Iron Deficiency Anemia Recent hospitalization for rapid anemia, with 13 AVM found and catheterized in the colon. Hemoglobin levels have improved (9.4) and stool color has returned to normal. -Continue iron infusions on October 8th and 15th, 2024. -Monitor hemoglobin levels and stool  color.  General Health Maintenance -Administer Vitamin B12 injection today. -Start Folic Acid supplementation today to prevent chemotherapy side effects. -Prescribe Compazine  for potential nausea related to chemotherapy. -Send all prescriptions to [facility] in [place]. -Follow-up in approximately two weeks.   The patient was advised to call immediately if Erin Pacheco has any other concerning symptoms in the interval. The patient voices understanding of current disease status and treatment options and is in agreement with the current care plan.  All questions were answered. The patient knows to call the clinic with any problems, questions or concerns. We can certainly see the patient much sooner if necessary. The total time spent in the appointment was 30 minutes.  Disclaimer: This note was dictated with voice recognition software. Similar sounding words can inadvertently be transcribed and may not be corrected upon review.

## 2023-03-08 NOTE — Telephone Encounter (Signed)
Eliquis 5mg  refill request received. Patient is 72 years old, weight-53.9kg, Crea-0.75 on 03/01/23, Diagnosis-PE, and last seen by Azalee Course on 12/21/22. Dose is appropriate based on dosing criteria. Will send in refill to requested pharmacy.

## 2023-03-08 NOTE — Progress Notes (Signed)
START ON PATHWAY REGIMEN - Non-Small Cell Lung     A cycle is every 21 days:     Pembrolizumab      Pemetrexed      Carboplatin   **Always confirm dose/schedule in your pharmacy ordering system**  Patient Characteristics: Stage IV Metastatic, Nonsquamous, Molecular Analysis Completed, Molecular Alteration Present and Targeted Therapy Exhausted OR KRAS G12C+ or HER2+ Present and No Prior Chemo/Immunotherapy OR No Alteration Present, Initial Chemotherapy/Immunotherapy, PS =  0, 1, BRAF/MET/KRAS/HER2 Mutation Positive, Candidate for Immunotherapy, PD-L1 Expression Positive 1-49% (TPS) / Negative / Not Tested / Awaiting Test Results and Immunotherapy Candidate Therapeutic Status: Stage IV Metastatic Histology: Nonsquamous Cell Broad Molecular Profiling Status: Molecular Analysis Completed Molecular Analysis Results: KRAS G12C Mutation Present and No Prior Chemo/Immunotherapy ECOG Performance Status: 1 Chemotherapy/Immunotherapy Line of Therapy: Initial Chemotherapy/Immunotherapy Immunotherapy Candidate Status: Candidate for Immunotherapy PD-L1 Expression Status: PD-L1 Positive 1-49% (TPS) Intent of Therapy: Non-Curative / Palliative Intent, Discussed with Patient

## 2023-03-09 ENCOUNTER — Ambulatory Visit: Payer: Medicare HMO | Admitting: Physician Assistant

## 2023-03-09 ENCOUNTER — Other Ambulatory Visit: Payer: Medicare HMO

## 2023-03-09 ENCOUNTER — Other Ambulatory Visit: Payer: Self-pay

## 2023-03-09 ENCOUNTER — Telehealth: Payer: Self-pay | Admitting: Internal Medicine

## 2023-03-09 NOTE — Telephone Encounter (Signed)
Scheduled per 10/02 los, called and spoke with patient's daughter regarding upcoming appointments. Patient is notified.

## 2023-03-10 ENCOUNTER — Other Ambulatory Visit: Payer: Self-pay

## 2023-03-10 ENCOUNTER — Inpatient Hospital Stay: Payer: Medicare HMO

## 2023-03-12 ENCOUNTER — Other Ambulatory Visit: Payer: Self-pay | Admitting: Physician Assistant

## 2023-03-13 ENCOUNTER — Telehealth: Payer: Self-pay | Admitting: Medical Oncology

## 2023-03-13 ENCOUNTER — Inpatient Hospital Stay: Payer: Medicare HMO

## 2023-03-13 NOTE — Telephone Encounter (Signed)
Dtr does not want pt to start chemo until her port a cath is placed. I gave dtr an updated schedule and told her to go to scheduling

## 2023-03-14 ENCOUNTER — Inpatient Hospital Stay: Payer: Medicare HMO

## 2023-03-14 ENCOUNTER — Ambulatory Visit: Payer: Medicare HMO

## 2023-03-14 VITALS — BP 108/54 | HR 63 | Temp 97.7°F | Resp 15

## 2023-03-14 DIAGNOSIS — Z5112 Encounter for antineoplastic immunotherapy: Secondary | ICD-10-CM | POA: Diagnosis not present

## 2023-03-14 DIAGNOSIS — D508 Other iron deficiency anemias: Secondary | ICD-10-CM

## 2023-03-14 MED ORDER — SODIUM CHLORIDE 0.9 % IV SOLN: Freq: Once | INTRAVENOUS | Status: AC

## 2023-03-14 MED ORDER — ACETAMINOPHEN 325 MG PO TABS
650.0000 mg | ORAL_TABLET | Freq: Once | ORAL | Status: AC
  Administered 2023-03-14: 650 mg via ORAL
  Filled 2023-03-14: qty 2

## 2023-03-14 MED ORDER — SODIUM CHLORIDE 0.9 % IV SOLN
300.0000 mg | Freq: Once | INTRAVENOUS | Status: AC
  Administered 2023-03-14: 300 mg via INTRAVENOUS
  Filled 2023-03-14: qty 200

## 2023-03-14 MED ORDER — DIPHENHYDRAMINE HCL 25 MG PO CAPS
50.0000 mg | ORAL_CAPSULE | Freq: Once | ORAL | Status: AC
  Administered 2023-03-14: 50 mg via ORAL
  Filled 2023-03-14: qty 2

## 2023-03-14 NOTE — Patient Instructions (Signed)
Iron Sucrose Injection What is this medication? IRON SUCROSE (EYE ern SOO krose) treats low levels of iron (iron deficiency anemia) in people with kidney disease. Iron is a mineral that plays an important role in making red blood cells, which carry oxygen from your lungs to the rest of your body. This medicine may be used for other purposes; ask your health care provider or pharmacist if you have questions. COMMON BRAND NAME(S): Venofer What should I tell my care team before I take this medication? They need to know if you have any of these conditions: Anemia not caused by low iron levels Heart disease High levels of iron in the blood Kidney disease Liver disease An unusual or allergic reaction to iron, other medications, foods, dyes, or preservatives Pregnant or trying to get pregnant Breastfeeding How should I use this medication? This medication is for infusion into a vein. It is given in a hospital or clinic setting. Talk to your care team about the use of this medication in children. While this medication may be prescribed for children as young as 2 years for selected conditions, precautions do apply. Overdosage: If you think you have taken too much of this medicine contact a poison control center or emergency room at once. NOTE: This medicine is only for you. Do not share this medicine with others. What if I miss a dose? Keep appointments for follow-up doses. It is important not to miss your dose. Call your care team if you are unable to keep an appointment. What may interact with this medication? Do not take this medication with any of the following: Deferoxamine Dimercaprol Other iron products This medication may also interact with the following: Chloramphenicol Deferasirox This list may not describe all possible interactions. Give your health care provider a list of all the medicines, herbs, non-prescription drugs, or dietary supplements you use. Also tell them if you smoke,  drink alcohol, or use illegal drugs. Some items may interact with your medicine. What should I watch for while using this medication? Visit your care team regularly. Tell your care team if your symptoms do not start to get better or if they get worse. You may need blood work done while you are taking this medication. You may need to follow a special diet. Talk to your care team. Foods that contain iron include: whole grains/cereals, dried fruits, beans, or peas, leafy green vegetables, and organ meats (liver, kidney). What side effects may I notice from receiving this medication? Side effects that you should report to your care team as soon as possible: Allergic reactions--skin rash, itching, hives, swelling of the face, lips, tongue, or throat Low blood pressure--dizziness, feeling faint or lightheaded, blurry vision Shortness of breath Side effects that usually do not require medical attention (report to your care team if they continue or are bothersome): Flushing Headache Joint pain Muscle pain Nausea Pain, redness, or irritation at injection site This list may not describe all possible side effects. Call your doctor for medical advice about side effects. You may report side effects to FDA at 1-800-FDA-1088. Where should I keep my medication? This medication is given in a hospital or clinic. It will not be stored at home. NOTE: This sheet is a summary. It may not cover all possible information. If you have questions about this medicine, talk to your doctor, pharmacist, or health care provider.  2024 Elsevier/Gold Standard (2022-10-28 00:00:00)

## 2023-03-15 ENCOUNTER — Other Ambulatory Visit: Payer: Medicare HMO

## 2023-03-15 ENCOUNTER — Ambulatory Visit: Payer: Medicare HMO

## 2023-03-16 ENCOUNTER — Ambulatory Visit: Payer: Medicare HMO

## 2023-03-20 ENCOUNTER — Other Ambulatory Visit: Payer: Self-pay

## 2023-03-20 ENCOUNTER — Telehealth: Payer: Self-pay

## 2023-03-20 DIAGNOSIS — C3412 Malignant neoplasm of upper lobe, left bronchus or lung: Secondary | ICD-10-CM

## 2023-03-20 NOTE — Telephone Encounter (Signed)
T/C from pt's daughter. [name], stating pt is now living with her and she would like to order a small shower chair. Shower chair ordered through [employer]. Daughter advised and they do not have sizes only the standard size chair.  Daughter said pt is having some swelling in her feet and ankles.  Advised to keep feet elevated, consume a low sodium diet, and increase her fluid intake but avoid soft drinks

## 2023-03-21 ENCOUNTER — Inpatient Hospital Stay: Payer: Medicare HMO

## 2023-03-21 ENCOUNTER — Ambulatory Visit: Payer: Medicare HMO

## 2023-03-21 VITALS — BP 139/78 | HR 69 | Temp 98.4°F | Resp 18

## 2023-03-21 DIAGNOSIS — D508 Other iron deficiency anemias: Secondary | ICD-10-CM

## 2023-03-21 DIAGNOSIS — Z5112 Encounter for antineoplastic immunotherapy: Secondary | ICD-10-CM | POA: Diagnosis not present

## 2023-03-21 MED ORDER — ACETAMINOPHEN 325 MG PO TABS
650.0000 mg | ORAL_TABLET | Freq: Once | ORAL | Status: AC
  Administered 2023-03-21: 650 mg via ORAL
  Filled 2023-03-21: qty 2

## 2023-03-21 MED ORDER — SODIUM CHLORIDE 0.9 % IV SOLN: Freq: Once | INTRAVENOUS | Status: AC

## 2023-03-21 MED ORDER — DIPHENHYDRAMINE HCL 25 MG PO CAPS
50.0000 mg | ORAL_CAPSULE | Freq: Once | ORAL | Status: AC
  Administered 2023-03-21: 50 mg via ORAL
  Filled 2023-03-21: qty 2

## 2023-03-21 MED ORDER — SODIUM CHLORIDE 0.9 % IV SOLN
300.0000 mg | Freq: Once | INTRAVENOUS | Status: AC
  Administered 2023-03-21: 300 mg via INTRAVENOUS
  Filled 2023-03-21: qty 300

## 2023-03-21 NOTE — Patient Instructions (Signed)
Iron Sucrose Injection What is this medication? IRON SUCROSE (EYE ern SOO krose) treats low levels of iron (iron deficiency anemia) in people with kidney disease. Iron is a mineral that plays an important role in making red blood cells, which carry oxygen from your lungs to the rest of your body. This medicine may be used for other purposes; ask your health care provider or pharmacist if you have questions. COMMON BRAND NAME(S): Venofer What should I tell my care team before I take this medication? They need to know if you have any of these conditions: Anemia not caused by low iron levels Heart disease High levels of iron in the blood Kidney disease Liver disease An unusual or allergic reaction to iron, other medications, foods, dyes, or preservatives Pregnant or trying to get pregnant Breastfeeding How should I use this medication? This medication is for infusion into a vein. It is given in a hospital or clinic setting. Talk to your care team about the use of this medication in children. While this medication may be prescribed for children as young as 2 years for selected conditions, precautions do apply. Overdosage: If you think you have taken too much of this medicine contact a poison control center or emergency room at once. NOTE: This medicine is only for you. Do not share this medicine with others. What if I miss a dose? Keep appointments for follow-up doses. It is important not to miss your dose. Call your care team if you are unable to keep an appointment. What may interact with this medication? Do not take this medication with any of the following: Deferoxamine Dimercaprol Other iron products This medication may also interact with the following: Chloramphenicol Deferasirox This list may not describe all possible interactions. Give your health care provider a list of all the medicines, herbs, non-prescription drugs, or dietary supplements you use. Also tell them if you smoke,  drink alcohol, or use illegal drugs. Some items may interact with your medicine. What should I watch for while using this medication? Visit your care team regularly. Tell your care team if your symptoms do not start to get better or if they get worse. You may need blood work done while you are taking this medication. You may need to follow a special diet. Talk to your care team. Foods that contain iron include: whole grains/cereals, dried fruits, beans, or peas, leafy green vegetables, and organ meats (liver, kidney). What side effects may I notice from receiving this medication? Side effects that you should report to your care team as soon as possible: Allergic reactions--skin rash, itching, hives, swelling of the face, lips, tongue, or throat Low blood pressure--dizziness, feeling faint or lightheaded, blurry vision Shortness of breath Side effects that usually do not require medical attention (report to your care team if they continue or are bothersome): Flushing Headache Joint pain Muscle pain Nausea Pain, redness, or irritation at injection site This list may not describe all possible side effects. Call your doctor for medical advice about side effects. You may report side effects to FDA at 1-800-FDA-1088. Where should I keep my medication? This medication is given in a hospital or clinic. It will not be stored at home. NOTE: This sheet is a summary. It may not cover all possible information. If you have questions about this medicine, talk to your doctor, pharmacist, or health care provider.  2024 Elsevier/Gold Standard (2022-10-28 00:00:00)

## 2023-03-21 NOTE — Progress Notes (Unsigned)
Height:       62.0 in Accession #:    [id number]       Weight:       127.2 lb Date of Birth:  [birth year]        BSA:          1.577 m Patient Age:    72 years         BP:           120/60 mmHg Patient Gender: F                 HR:           69 bpm. Exam Location:  [address]  Procedure: Limited Echo, Cardiac Doppler, Limited Color Doppler and Intracardiac Opacification Agent  Indications:    I50.21 Acute systolic (congestive) heart failure  History:        Patient has prior history of Echocardiogram examinations, most recent 11/23/2022. Cardiomyopathy, Arrythmias:LBBB; Risk Factors:Hypertension. Lung cancer. Pulmonary emboli.  Sonographer:    Cathie Beams RCS Referring Phys: HAO MENG  IMPRESSIONS   1. Left ventricular ejection fraction, by estimation, is 30 to 35%. The left ventricle has moderately decreased function. The left ventricle demonstrates regional wall motion abnormalities. Abnormal (paradoxical) septal motion, consistent with left bundle branch block. There is mild left ventricular hypertrophy. Left ventricular diastolic parameters are consistent with Grade II diastolic dysfunction (pseudonormalization). Elevated left atrial pressure. 2. Right ventricular systolic function is normal. The right ventricular size is normal. Tricuspid regurgitation signal is inadequate for assessing PA pressure. 3. Left atrial size was moderately dilated. 4. The mitral valve is normal in structure. Trivial mitral valve regurgitation. 5. The aortic valve is tricuspid. Aortic valve regurgitation is mild to moderate. Aortic valve sclerosis is present, with no evidence of aortic valve stenosis. 6. The inferior vena cava is normal in size with greater than 50% respiratory variability, suggesting right atrial pressure of 3 mmHg.  FINDINGS Left Ventricle: Left ventricular ejection fraction, by estimation, is 30 to 35%. The left ventricle has moderately decreased function. The left ventricle demonstrates regional wall motion abnormalities. The left ventricular internal cavity size was normal in size. There is mild left ventricular hypertrophy. Abnormal (paradoxical) septal motion, consistent with left bundle branch block.  Left ventricular diastolic parameters are consistent with Grade II diastolic dysfunction (pseudonormalization). Elevated left atrial pressure.  Right Ventricle: The right ventricular size is normal. No increase in right ventricular wall thickness. Right ventricular systolic function is normal. Tricuspid regurgitation signal is inadequate for assessing PA pressure.  Left Atrium: Left atrial size was moderately dilated.  Pericardium: Trivial pericardial effusion is present.  Mitral Valve: The mitral valve is normal in structure. Trivial mitral valve regurgitation.  Aortic Valve: The aortic valve is tricuspid. Aortic valve regurgitation is mild to moderate. Aortic valve sclerosis is present, with no evidence of aortic valve stenosis.  Pulmonic Valve: The pulmonic valve was not well visualized. Pulmonic valve regurgitation is not visualized.  Aorta: The aortic root and ascending aorta are structurally normal, with no evidence of dilitation.  Venous: The inferior vena cava is normal in size with greater than 50% respiratory variability, suggesting right atrial pressure of 3 mmHg.  IAS/Shunts: The interatrial septum was not well visualized.  LEFT VENTRICLE PLAX 2D LVIDd:         4.70 cm   Diastology LVIDs:         4.10 cm   LV e' medial:    5.00 cm/s LV PW:  Height:       62.0 in Accession #:    [id number]       Weight:       127.2 lb Date of Birth:  [birth year]        BSA:          1.577 m Patient Age:    72 years         BP:           120/60 mmHg Patient Gender: F                 HR:           69 bpm. Exam Location:  [address]  Procedure: Limited Echo, Cardiac Doppler, Limited Color Doppler and Intracardiac Opacification Agent  Indications:    I50.21 Acute systolic (congestive) heart failure  History:        Patient has prior history of Echocardiogram examinations, most recent 11/23/2022. Cardiomyopathy, Arrythmias:LBBB; Risk Factors:Hypertension. Lung cancer. Pulmonary emboli.  Sonographer:    Cathie Beams RCS Referring Phys: HAO MENG  IMPRESSIONS   1. Left ventricular ejection fraction, by estimation, is 30 to 35%. The left ventricle has moderately decreased function. The left ventricle demonstrates regional wall motion abnormalities. Abnormal (paradoxical) septal motion, consistent with left bundle branch block. There is mild left ventricular hypertrophy. Left ventricular diastolic parameters are consistent with Grade II diastolic dysfunction (pseudonormalization). Elevated left atrial pressure. 2. Right ventricular systolic function is normal. The right ventricular size is normal. Tricuspid regurgitation signal is inadequate for assessing PA pressure. 3. Left atrial size was moderately dilated. 4. The mitral valve is normal in structure. Trivial mitral valve regurgitation. 5. The aortic valve is tricuspid. Aortic valve regurgitation is mild to moderate. Aortic valve sclerosis is present, with no evidence of aortic valve stenosis. 6. The inferior vena cava is normal in size with greater than 50% respiratory variability, suggesting right atrial pressure of 3 mmHg.  FINDINGS Left Ventricle: Left ventricular ejection fraction, by estimation, is 30 to 35%. The left ventricle has moderately decreased function. The left ventricle demonstrates regional wall motion abnormalities. The left ventricular internal cavity size was normal in size. There is mild left ventricular hypertrophy. Abnormal (paradoxical) septal motion, consistent with left bundle branch block.  Left ventricular diastolic parameters are consistent with Grade II diastolic dysfunction (pseudonormalization). Elevated left atrial pressure.  Right Ventricle: The right ventricular size is normal. No increase in right ventricular wall thickness. Right ventricular systolic function is normal. Tricuspid regurgitation signal is inadequate for assessing PA pressure.  Left Atrium: Left atrial size was moderately dilated.  Pericardium: Trivial pericardial effusion is present.  Mitral Valve: The mitral valve is normal in structure. Trivial mitral valve regurgitation.  Aortic Valve: The aortic valve is tricuspid. Aortic valve regurgitation is mild to moderate. Aortic valve sclerosis is present, with no evidence of aortic valve stenosis.  Pulmonic Valve: The pulmonic valve was not well visualized. Pulmonic valve regurgitation is not visualized.  Aorta: The aortic root and ascending aorta are structurally normal, with no evidence of dilitation.  Venous: The inferior vena cava is normal in size with greater than 50% respiratory variability, suggesting right atrial pressure of 3 mmHg.  IAS/Shunts: The interatrial septum was not well visualized.  LEFT VENTRICLE PLAX 2D LVIDd:         4.70 cm   Diastology LVIDs:         4.10 cm   LV e' medial:    5.00 cm/s LV PW:  Height:       62.0 in Accession #:    [id number]       Weight:       127.2 lb Date of Birth:  [birth year]        BSA:          1.577 m Patient Age:    72 years         BP:           120/60 mmHg Patient Gender: F                 HR:           69 bpm. Exam Location:  [address]  Procedure: Limited Echo, Cardiac Doppler, Limited Color Doppler and Intracardiac Opacification Agent  Indications:    I50.21 Acute systolic (congestive) heart failure  History:        Patient has prior history of Echocardiogram examinations, most recent 11/23/2022. Cardiomyopathy, Arrythmias:LBBB; Risk Factors:Hypertension. Lung cancer. Pulmonary emboli.  Sonographer:    Cathie Beams RCS Referring Phys: HAO MENG  IMPRESSIONS   1. Left ventricular ejection fraction, by estimation, is 30 to 35%. The left ventricle has moderately decreased function. The left ventricle demonstrates regional wall motion abnormalities. Abnormal (paradoxical) septal motion, consistent with left bundle branch block. There is mild left ventricular hypertrophy. Left ventricular diastolic parameters are consistent with Grade II diastolic dysfunction (pseudonormalization). Elevated left atrial pressure. 2. Right ventricular systolic function is normal. The right ventricular size is normal. Tricuspid regurgitation signal is inadequate for assessing PA pressure. 3. Left atrial size was moderately dilated. 4. The mitral valve is normal in structure. Trivial mitral valve regurgitation. 5. The aortic valve is tricuspid. Aortic valve regurgitation is mild to moderate. Aortic valve sclerosis is present, with no evidence of aortic valve stenosis. 6. The inferior vena cava is normal in size with greater than 50% respiratory variability, suggesting right atrial pressure of 3 mmHg.  FINDINGS Left Ventricle: Left ventricular ejection fraction, by estimation, is 30 to 35%. The left ventricle has moderately decreased function. The left ventricle demonstrates regional wall motion abnormalities. The left ventricular internal cavity size was normal in size. There is mild left ventricular hypertrophy. Abnormal (paradoxical) septal motion, consistent with left bundle branch block.  Left ventricular diastolic parameters are consistent with Grade II diastolic dysfunction (pseudonormalization). Elevated left atrial pressure.  Right Ventricle: The right ventricular size is normal. No increase in right ventricular wall thickness. Right ventricular systolic function is normal. Tricuspid regurgitation signal is inadequate for assessing PA pressure.  Left Atrium: Left atrial size was moderately dilated.  Pericardium: Trivial pericardial effusion is present.  Mitral Valve: The mitral valve is normal in structure. Trivial mitral valve regurgitation.  Aortic Valve: The aortic valve is tricuspid. Aortic valve regurgitation is mild to moderate. Aortic valve sclerosis is present, with no evidence of aortic valve stenosis.  Pulmonic Valve: The pulmonic valve was not well visualized. Pulmonic valve regurgitation is not visualized.  Aorta: The aortic root and ascending aorta are structurally normal, with no evidence of dilitation.  Venous: The inferior vena cava is normal in size with greater than 50% respiratory variability, suggesting right atrial pressure of 3 mmHg.  IAS/Shunts: The interatrial septum was not well visualized.  LEFT VENTRICLE PLAX 2D LVIDd:         4.70 cm   Diastology LVIDs:         4.10 cm   LV e' medial:    5.00 cm/s LV PW:  Cardiology Office Note    Date:  03/22/2023  ID:  NELLENE COURTOIS, DOB [birth year], MRN [id number] PCP:  [name], [name]  Cardiologist:  Rollene Rotunda, MD  Electrophysiologist:  None   Chief Complaint: Follow up for HFrEF   History of Present Illness: .    Erin Pacheco is a 72 y.o. female with visit-pertinent history of hypertension, tobacco abuse, stage four non small cell lung cancer, LBBB, PE and LV dysfunction.   First seen by cardiology service on 11/17/22 during hospital admission for persistent dry cough. Serial troponin was negative x2. D-dimer was elevated. A CTA showed segmental PE in the right upper lobe greater than left lower lobe, small amount of clot burden, emphysematous lung changes with spiculated left upper lobe lung mass measuring 2.5 cm, there was also right adrenal mass measuring 4.5 x 2.9 cm. Image was concerning for malignancy, she was transferred to [facility] for further workup.  Echocardiogram on 11/16/2022 showed EF 30 to 35%, global hypokinesis, normal RV, small circumferential pericardial effusion, mild LAE, mild to moderate AI.  She was seen by Dr. Antoine Poche and started on Toprol XL, ARB and spironolactone.  After a renal biopsy she was started on Eliquis.  Lower extremity venous Doppler was negative for DVT.  MRI of the brain was negative for metastasis or acute intracranial abnormality.  Bone scan was negative for metastasis.  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 MRCP revealed large right adrenal mass with evidence of hemorrhage.  She underwent upper EGD on 12/09/2022 which showed 6 nonbleeding angioectasias in the duodenum, single duodenal polyp which was biopsied, duodenal lipoma was also biopsied. She was last seen in office on 12/21/22, her Losartan was increased to 50 mg daily.  Repeat limited echocardiogram on 02/15/2023 indicated LVEF of 30 to  35%, LV with moderately decreased function, abnormal septal motion, consistent with left bundle branch block, mild LV hypertrophy, grade 2 diastolic dysfunction.  Left atrial size was moderately dilated, trivial MR, mild to moderate AI.  On 02/27/23 she presented to the ED for hemoglobin of 5.4, FOBT positive.  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  She had a single nonbleeding angiodysplastic lesion in the stomach treated with APC, 1 ablated, normal esophagus.  She was restarted on her Eliquis.  Today she presents for hospital follow up. She reports she is doing well.  She is planning to start chemotherapy on October 30, she will have her port placed tomorrow.  She denies any chest pain, shortness of breath, orthopnea or PND.  She does endorse occasional mild lower extremity edema that resolved following decreased intake of canned soups.  She denies any further bleeding problems, presyncope or syncope.  Labwork independently reviewed: 03/08/2023: Hemoglobin 9.4, hematocrit 29.6, sodium 134, potassium 3.3, creatinine 0.78  ROS: .   Today she denies chest pain, shortness of breath, fatigue, palpitations, melena, hematuria, hemoptysis, diaphoresis, weakness, presyncope, syncope, orthopnea, and PND.  All other systems are reviewed and otherwise negative. Studies Reviewed: Marland Kitchen    EKG:  EKG not ordered today.   CV Studies:  Cardiac Studies & Procedures       ECHOCARDIOGRAM  ECHOCARDIOGRAM LIMITED 02/15/2023  Narrative ECHOCARDIOGRAM LIMITED REPORT    Patient Name:   Erin Pacheco Date of Exam: 02/15/2023 Medical Rec #:  [id number]

## 2023-03-22 ENCOUNTER — Ambulatory Visit: Payer: Medicare HMO | Attending: Cardiology | Admitting: Cardiology

## 2023-03-22 ENCOUNTER — Other Ambulatory Visit: Payer: Medicare HMO

## 2023-03-22 ENCOUNTER — Encounter: Payer: Self-pay | Admitting: Nurse Practitioner

## 2023-03-22 ENCOUNTER — Other Ambulatory Visit: Payer: Self-pay | Admitting: Radiology

## 2023-03-22 ENCOUNTER — Ambulatory Visit: Payer: Medicare HMO | Admitting: Physician Assistant

## 2023-03-22 VITALS — BP 106/56 | HR 82 | Ht 63.0 in | Wt 120.0 lb

## 2023-03-22 DIAGNOSIS — E278 Other specified disorders of adrenal gland: Secondary | ICD-10-CM | POA: Diagnosis not present

## 2023-03-22 DIAGNOSIS — R918 Other nonspecific abnormal finding of lung field: Secondary | ICD-10-CM | POA: Diagnosis not present

## 2023-03-22 DIAGNOSIS — I5020 Unspecified systolic (congestive) heart failure: Secondary | ICD-10-CM

## 2023-03-22 DIAGNOSIS — I2694 Multiple subsegmental pulmonary emboli without acute cor pulmonale: Secondary | ICD-10-CM | POA: Diagnosis not present

## 2023-03-22 NOTE — Patient Instructions (Signed)
Medication Instructions:  Your physician recommends that you continue on your current medications as directed. Please refer to the Current Medication list given to you today.  *If you need a refill on your cardiac medications before your next appointment, please call your pharmacy*   Lab Work: NONE ordered at this time of appointment    Testing/Procedures: NONE ordered at this time of appointment     Follow-Up: At [facility], you and your health needs are our priority.  As part of our continuing mission to provide you with exceptional heart care, we have created designated Provider Care Teams.  These Care Teams include your primary Cardiologist (physician) and Advanced Practice Providers (APPs -  Physician Assistants and Nurse Practitioners) who all work together to provide you with the care you need, when you need it.  We recommend signing up for the patient portal called "MyChart".  Sign up information is provided on this After Visit Summary.  MyChart is used to connect with patients for Virtual Visits (Telemedicine).  Patients are able to view lab/test results, encounter notes, upcoming appointments, etc.  Non-urgent messages can be sent to your provider as well.   To learn more about what you can do with MyChart, go to [email or web address].    Your next appointment:    Next Available Dr. Antoine Poche  3 months with Mellody Drown NP (if can't see Dr. Antoine Poche)  Provider:   Rollene Rotunda, MD  or Reather Littler, NP        Other Instructions

## 2023-03-22 NOTE — H&P (Incomplete)
Referring Physician(s): Mohamed,Mohamed  Supervising Physician: Marliss Coots  Patient Status:  [facility] OP  Chief Complaint:  "I'm getting a port a cath"  Subjective: Pt known to IR team from right adrenal mass bx on 11/23/22. She is a 72 yo female with PMH sig for HTN, tobacco abuse, LBBB, PE, arthritis, COPD, anemia,  LV dysfunction and recently diagnosed stage IV left  lung cancer. She is scheduled today for port a cath placement to assist with treatment.    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   Cardiomyopathy (HCC)    COPD (chronic obstructive pulmonary disease) (HCC)    Hypertension    Pulmonary embolism (HCC) 11/16/2022   Tobacco abuse    Past Surgical History:  Procedure Laterality Date   ABDOMINAL HYSTERECTOMY     BILATERAL OOPHORECTOMY     BIOPSY  12/09/2022   Procedure: BIOPSY;  Surgeon: Imogene Burn, MD;  Location: [facility] ENDOSCOPY;  Service: Gastroenterology;;   BRONCHIAL BIOPSY  01/09/2023   Procedure: BRONCHIAL BIOPSIES;  Surgeon: Josephine Igo, DO;  Location: [facility] ENDOSCOPY;  Service: Pulmonary;;   BRONCHIAL NEEDLE ASPIRATION BIOPSY  01/09/2023   Procedure: BRONCHIAL NEEDLE ASPIRATION BIOPSIES;  Surgeon: Josephine Igo, DO;  Location: [facility] ENDOSCOPY;  Service: Pulmonary;;   CATARACT EXTRACTION W/PHACO  12/27/2010   Procedure: CATARACT EXTRACTION PHACO AND INTRAOCULAR LENS PLACEMENT (IOC);  Surgeon: Gemma Payor;  Location: [facility] ORS;  Service: Ophthalmology;  Laterality: Right;   CATARACT EXTRACTION W/PHACO  03/28/2011   Procedure: CATARACT EXTRACTION PHACO AND INTRAOCULAR LENS PLACEMENT (IOC);  Surgeon: Gemma Payor;  Location: [facility] ORS;  Service: Ophthalmology;  Laterality: Left;  CDE 7.27   ENTEROSCOPY N/A 02/28/2023   Procedure: ENTEROSCOPY;  Surgeon: Iva Boop, MD;  Location: [facility] ENDOSCOPY;  Service: Gastroenterology;  Laterality: N/A;   ESOPHAGOGASTRODUODENOSCOPY (EGD) WITH PROPOFOL N/A 12/09/2022    Procedure: ESOPHAGOGASTRODUODENOSCOPY (EGD) WITH PROPOFOL;  Surgeon: Imogene Burn, MD;  Location: [facility] ENDOSCOPY;  Service: Gastroenterology;  Laterality: N/A;   HOT HEMOSTASIS N/A 02/28/2023   Procedure: HOT HEMOSTASIS (ARGON PLASMA COAGULATION/BICAP);  Surgeon: Iva Boop, MD;  Location: [facility]OS[facility];  Service: Gastroenterology;  Laterality: N/A;   SUBMUCOSAL TATTOO INJECTION  02/28/2023   Procedure: SUBMUCOSAL TATTOO INJECTION;  Surgeon: Iva Boop, MD;  Location: [facility] ENDOSCOPY;  Service: Gastroenterology;;      Allergies: Patient has no known allergies.  Medications: Prior to Admission medications   Medication Sig Start Date End Date Taking? Authorizing Provider  acetaminophen (TYLENOL) 325 MG tablet Take 2 tablets (650 mg total) by mouth every 6 (six) hours as needed for mild pain (or Fever >/= 101). 11/26/22   [name], MD  apixaban (ELIQUIS) 5 MG TABS tablet Take 1 tablet (5 mg total) by mouth 2 (two) times daily. 03/08/23   Rollene Rotunda, MD  Fluticasone-Umeclidin-Vilant (TRELEGY ELLIPTA) 100-62.5-25 MCG/ACT AEPB Inhale 1 each into the lungs daily. 01/17/23   Bevelyn Ngo, NP  folic acid (FOLVITE) 1 MG tablet Take 1 tablet (1 mg total) by mouth daily. Start 7 days before pemetrexed chemotherapy. Continue until 21 days after pemetrexed completed. 03/13/23   Si Gaul, MD  lidocaine-prilocaine (EMLA) cream Apply to the Port-A-Cath site 30 minutes before treatment. 03/08/23   Si Gaul, MD  metoprolol succinate (TOPROL-XL) 50 MG 24 hr tablet TAKE 1 TABLET BY MOUTH DAILY. TAKE WITH OR IMMEDIATELY FOLLOWING A MEAL. 03/14/23   Azalee Course,  PA  Multiple Vitamin (MULTIVITAMIN) tablet Take 1 tablet by mouth daily.      [provider]  ondansetron (ZOFRAN) 8 MG tablet Take 1 tablet (8 mg total) by mouth every 8 (eight) hours as needed for nausea or vomiting. 02/09/23   Ronny Bacon, [name]  oxyCODONE (OXY IR/ROXICODONE) 5 MG immediate release tablet Take 1  tablet (5 mg total) by mouth every 6 (six) hours as needed for moderate pain. 12/22/22   Jaci Standard, MD  pantoprazole (PROTONIX) 40 MG tablet Take 1 tablet (40 mg total) by mouth daily before breakfast. 03/01/23   Rai, Delene Ruffini, MD  Polyethyl Glycol-Propyl Glycol (SYSTANE OP) Place 1 drop into both eyes daily as needed (dry eye).    [provider]  prochlorperazine (COMPAZINE) 10 MG tablet Take 1 tablet (10 mg total) by mouth every 6 (six) hours as needed for nausea or vomiting. 03/13/23   Si Gaul, MD  sacubitril-valsartan (ENTRESTO) 49-51 MG Take 1 tablet by mouth 2 (two) times daily. 02/28/23   Azalee Course, PA  spironolactone (ALDACTONE) 25 MG tablet Take 0.5 tablets (12.5 mg total) by mouth daily. Patient not taking: Reported on 03/22/2023 12/21/22   Azalee Course, PA     Vital Signs:   Code Status:   Physical Exam  Imaging: No results found.  Labs:  CBC: Recent Labs    02/27/23 1031 02/28/23 0424 03/01/23 0730 03/08/23 0813  WBC 7.6 8.2 10.7* 5.3  HGB 5.4* 8.7* 9.1* 9.4*  HCT 17.7* 27.2* 28.7* 29.6*  PLT 376 304 300 328    COAGS: Recent Labs    11/18/22 0357  INR 1.3*    BMP: Recent Labs    02/27/23 1031 02/28/23 0424 03/01/23 0730 03/08/23 0813  NA 135 136 131* 134*  K 4.4 4.1 3.4* 3.3*  CL 102 103 99 102  CO2 23 23 23 25   GLUCOSE 89 94 122* 123*  BUN 12 8 10 9   CALCIUM 8.7* 8.6* 8.1* 8.9  CREATININE 0.72 0.72 0.75 0.78  GFRNONAA >60 >60 >60 >60    LIVER FUNCTION TESTS: Recent Labs    02/01/23 1401 02/27/23 0735 02/28/23 0424 03/08/23 0813  BILITOT 0.3 0.3 1.2 0.3  AST 13* 11* 14* 17  ALT 12 8 11 12   ALKPHOS 72 59 54 75  PROT 8.2* 7.5 7.3 7.6  ALBUMIN 3.2* 2.9* 2.3* 2.9*    Assessment and Plan: 72 yo female with PMH sig for HTN, tobacco abuse, LBBB, PE, arthritis, COPD, anemia,  LV dysfunction and recently diagnosed stage IV left  lung cancer. She is scheduled today for port a cath placement to assist with treatment.  Risks and benefits of image guided port-a-catheter placement was discussed with the patient including, but not limited to bleeding, infection, pneumothorax, or fibrin sheath development and need for additional procedures.  All of the patient's questions were answered, patient is agreeable to proceed. Consent signed and in chart.    Electronically Signed: D. Jeananne Rama, [name] 03/22/2023, 9:09 PM   I spent a total of 25 minutes at the the patient's bedside AND on the patient's hospital floor or unit, greater than 50% of which was counseling/coordinating care for port a cath placement

## 2023-03-23 ENCOUNTER — Ambulatory Visit (HOSPITAL_COMMUNITY)
Admission: RE | Admit: 2023-03-23 | Discharge: 2023-03-23 | Disposition: A | Discharge status: Home / Self Care | Payer: Medicare HMO | Source: Ambulatory Visit | Attending: Internal Medicine | Admitting: Internal Medicine

## 2023-03-23 ENCOUNTER — Encounter (HOSPITAL_COMMUNITY): Payer: Self-pay

## 2023-03-23 ENCOUNTER — Other Ambulatory Visit: Payer: Self-pay | Admitting: Medical Oncology

## 2023-03-23 ENCOUNTER — Ambulatory Visit: Payer: Medicare HMO

## 2023-03-23 ENCOUNTER — Telehealth: Payer: Self-pay | Admitting: Medical Oncology

## 2023-03-23 DIAGNOSIS — Z87891 Personal history of nicotine dependence: Secondary | ICD-10-CM | POA: Insufficient documentation

## 2023-03-23 DIAGNOSIS — C3492 Malignant neoplasm of unspecified part of left bronchus or lung: Secondary | ICD-10-CM | POA: Diagnosis not present

## 2023-03-23 DIAGNOSIS — Z923 Personal history of irradiation: Secondary | ICD-10-CM | POA: Insufficient documentation

## 2023-03-23 DIAGNOSIS — C3412 Malignant neoplasm of upper lobe, left bronchus or lung: Secondary | ICD-10-CM

## 2023-03-23 DIAGNOSIS — D500 Iron deficiency anemia secondary to blood loss (chronic): Secondary | ICD-10-CM

## 2023-03-23 HISTORY — PX: IR IMAGING GUIDED PORT INSERTION: IMG5740

## 2023-03-23 MED ORDER — SODIUM CHLORIDE 0.9% FLUSH: 10.0000 mL | Freq: Two times a day (BID) | INTRAVENOUS | Status: DC

## 2023-03-23 MED ORDER — HEPARIN SOD (PORK) LOCK FLUSH 100 UNIT/ML IV SOLN
INTRAVENOUS | Status: AC
  Filled 2023-03-23: qty 5

## 2023-03-23 MED ORDER — SODIUM CHLORIDE 0.9 % IV SOLN: INTRAVENOUS | Status: DC

## 2023-03-23 MED ORDER — MIDAZOLAM HCL 2 MG/2ML IJ SOLN
INTRAMUSCULAR | Status: DC | PRN
Start: 2023-03-23 — End: 2023-03-24
  Administered 2023-03-23: 1 mg via INTRAVENOUS

## 2023-03-23 MED ORDER — FENTANYL CITRATE (PF) 100 MCG/2ML IJ SOLN
INTRAMUSCULAR | Status: DC | PRN
  Administered 2023-03-23: 50 ug via INTRAVENOUS

## 2023-03-23 MED ORDER — LIDOCAINE-EPINEPHRINE 1 %-1:100000 IJ SOLN
INTRAMUSCULAR | Status: AC
  Filled 2023-03-23: qty 1

## 2023-03-23 MED ORDER — LIDOCAINE-EPINEPHRINE 1 %-1:100000 IJ SOLN
20.0000 mL | Freq: Once | INTRAMUSCULAR | Status: AC
  Administered 2023-03-23: 20 mL via INTRADERMAL

## 2023-03-23 MED ORDER — HEPARIN SOD (PORK) LOCK FLUSH 100 UNIT/ML IV SOLN
500.0000 [IU] | Freq: Once | INTRAVENOUS | Status: AC
  Administered 2023-03-23: 500 [IU] via INTRAVENOUS

## 2023-03-23 MED ORDER — FENTANYL CITRATE (PF) 100 MCG/2ML IJ SOLN
INTRAMUSCULAR | Status: AC
  Filled 2023-03-23: qty 2

## 2023-03-23 MED ORDER — MIDAZOLAM HCL 2 MG/2ML IJ SOLN
INTRAMUSCULAR | Status: AC
  Filled 2023-03-23: qty 2

## 2023-03-23 NOTE — Procedures (Signed)
Interventional Radiology Procedure Note ° °Procedure: Single Lumen Power Port Placement   ° °Access:  Right internal jugular vein ° °Findings: Catheter tip positioned at cavoatrial junction. Port is ready for immediate use.  ° °Complications: None ° °EBL: < 10 mL ° °Recommendations:  °- Ok to shower in 24 hours °- Do not submerge for 7 days °- Routine line care  ° ° °Mithran Strike, MD ° ° ° °

## 2023-03-23 NOTE — Telephone Encounter (Signed)
Dtr notified shower chair order was faxed to [facility].

## 2023-03-23 NOTE — H&P (Signed)
Chief Complaint: Patient was seen in consultation today for port placement  Referring Physician(s): Mohamed,Mohamed  History of Present Illness: Erin Pacheco is a 72 y.o. female with history of advanced stage left lung cancer status post radiation and immunotherapy.     Initiation of chemotherapy is planned and she requires durable venous access. She feels well today but is frustrated with how long she'd had to wait here due to delays in our department. No fevers, chills, chest pain, shortness of breath, nausea, vomiting.   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   Cardiomyopathy (HCC)    COPD (chronic obstructive pulmonary disease) (HCC)    Hypertension    Pulmonary embolism (HCC) 11/16/2022   Tobacco abuse     Past Surgical History:  Procedure Laterality Date   ABDOMINAL HYSTERECTOMY     BILATERAL OOPHORECTOMY     BIOPSY  12/09/2022   Procedure: BIOPSY;  Surgeon: Imogene Burn, MD;  Location: [facility] ENDOSCOPY;  Service: Gastroenterology;;   BRONCHIAL BIOPSY  01/09/2023   Procedure: BRONCHIAL BIOPSIES;  Surgeon: Josephine Igo, DO;  Location: [facility] ENDOSCOPY;  Service: Pulmonary;;   BRONCHIAL NEEDLE ASPIRATION BIOPSY  01/09/2023   Procedure: BRONCHIAL NEEDLE ASPIRATION BIOPSIES;  Surgeon: Josephine Igo, DO;  Location: [facility] ENDOSCOPY;  Service: Pulmonary;;   CATARACT EXTRACTION W/PHACO  12/27/2010   Procedure: CATARACT EXTRACTION PHACO AND INTRAOCULAR LENS PLACEMENT (IOC);  Surgeon: Gemma Payor;  Location: [facility] ORS;  Service: Ophthalmology;  Laterality: Right;   CATARACT EXTRACTION W/PHACO  03/28/2011   Procedure: CATARACT EXTRACTION PHACO AND INTRAOCULAR LENS PLACEMENT (IOC);  Surgeon: Gemma Payor;  Location: [facility] ORS;  Service: Ophthalmology;  Laterality: Left;  CDE 7.27   ENTEROSCOPY N/A 02/28/2023   Procedure: ENTEROSCOPY;  Surgeon: Iva Boop, MD;  Location: [facility] ENDOSCOPY;  Service: Gastroenterology;  Laterality:  N/A;   ESOPHAGOGASTRODUODENOSCOPY (EGD) WITH PROPOFOL N/A 12/09/2022   Procedure: ESOPHAGOGASTRODUODENOSCOPY (EGD) WITH PROPOFOL;  Surgeon: Imogene Burn, MD;  Location[facility] END[facility]COPY;  Service: Gastroenterology;  Laterality: N/A;   HOT HEMOSTASIS N/A 02/28/2023   Procedure: HOT HEMOSTASIS (ARGON PLASMA COAGULATION/BICAP);  Surgeon: Iva Boop, MD;  Location: [facility] ENDOSCOPY;  Service: Gastroenterology;  Laterality: N/A;   SUBMUCOSAL TATTOO INJECTION  02/28/2023   Procedure: SUBMUCOSAL TATTOO INJECTION;  Surgeon: Iva Boop, MD;  Location: [facility] ENDOSCOPY;  Service: Gastroenterology;;    Allergies: Patient has no known allergies.  Medications: Prior to Admission medications   Medication Sig Start Date End Date Taking? Authorizing Provider  acetaminophen (TYLENOL) 325 MG tablet Take 2 tablets (650 mg total) by mouth every 6 (six) hours as needed for mild pain (or Fever >/= 101). 11/26/22  Yes Sharl Ma, Sarina Ill, MD  Fluticasone-Umeclidin-Vilant (TRELEGY ELLIPTA) 100-62.5-25 MCG/ACT AEPB Inhale 1 each into the lungs daily. 01/17/23  Yes Bevelyn Ngo, NP  folic acid (FOLVITE) 1 MG tablet Take 1 tablet (1 mg total) by mouth daily. Start 7 days before pemetrexed chemotherapy. Continue until 21 days after pemetrexed completed. 03/13/23  Yes Si Gaul, MD  metoprolol succinate (TOPROL-XL) 50 MG 24 hr tablet TAKE 1 TABLET BY MOUTH DAILY. TAKE WITH OR IMMEDIATELY FOLLOWING A MEAL. 03/14/23  Yes Azalee Course, PA  Multiple Vitamin (MULTIVITAMIN) tablet Take 1 tablet by mouth daily.     Yes [provider]  oxyCODONE (OXY IR/ROXICODONE) 5 MG immediate release tablet Take 1 tablet (5 mg total) by mouth every 6 (six) hours  as needed for moderate pain. 12/22/22  Yes Jaci Standard, MD  sacubitril-valsartan (ENTRESTO) 49-51 MG Take 1 tablet by mouth 2 (two) times daily. 02/28/23  Yes Azalee Course, PA  apixaban (ELIQUIS) 5 MG TABS tablet Take 1 tablet (5 mg total) by mouth 2 (two) times daily. 03/08/23    Rollene Rotunda, MD  lidocaine-prilocaine (EMLA) cream Apply to the Port-A-Cath site 30 minutes before treatment. 03/08/23   Si Gaul, MD  ondansetron (ZOFRAN) 8 MG tablet Take 1 tablet (8 mg total) by mouth every 8 (eight) hours as needed for nausea or vomiting. 02/09/23   Ronny Bacon, [name]  pantoprazole (PROTONIX) 40 MG tablet Take 1 tablet (40 mg total) by mouth daily before breakfast. 03/01/23   Rai, Delene Ruffini, MD  Polyethyl Glycol-Propyl Glycol (SYSTANE OP) Place 1 drop into both eyes daily as needed (dry eye).    [provider]  prochlorperazine (COMPAZINE) 10 MG tablet Take 1 tablet (10 mg total) by mouth every 6 (six) hours as needed for nausea or vomiting. 03/13/23   Si Gaul, MD  spironolactone (ALDACTONE) 25 MG tablet Take 0.5 tablets (12.5 mg total) by mouth daily. Patient not taking: Reported on 03/22/2023 12/21/22   Azalee Course, PA     Family History  Problem Relation Age of Onset   Ovarian cancer Mother 45   High blood pressure Mother    Throat cancer Brother        smoker   Liver disease Neg Hx    Esophageal cancer Neg Hx    Colon cancer Neg Hx     Social History   Socioeconomic History   Marital status: Single    Spouse name: Not on file   Number of children: Not on file   Years of education: Not on file   Highest education level: Not on file  Occupational History   Not on file  Tobacco Use   Smoking status: Former    Current packs/day: 0.00    Average packs/day: 0.5 packs/day for 30.0 years (15.0 ttl pk-yrs)    Types: Cigarettes    Start date: 11/1992    Quit date: 11/2022    Years since quitting: 0.3   Smokeless tobacco: Never  Vaping Use   Vaping status: Never Used  Substance and Sexual Activity   Alcohol use: No   Drug use: No   Sexual activity: Yes    Birth control/protection: Surgical  Other Topics Concern   Not on file  Social History Narrative   Not on file   Social Determinants of Health   Financial Resource  Strain: Low Risk  (01/30/2023)   Received from [facility]   Overall Financial Resource Strain (CARDIA)    Difficulty of Paying Living Expenses: Not hard at all  Food Insecurity: No Food Insecurity (02/27/2023)   Hunger Vital Sign    Worried About Running Out of Food in the Last Year: Never true    Ran Out of Food in the Last Year: Never true  Transportation Needs: No Transportation Needs (02/27/2023)   PRAPARE - Administrator, Civil Service (Medical): No    Lack of Transportation (Non-Medical): No  Physical Activity: Unknown (01/30/2023)   Received from [facility]   Exercise Vital Sign    Days of Exercise per Week: 0 days    Minutes of Exercise per Session: Not on file  Stress: No Stress Concern Present (01/30/2023)   Received from [facility] - Occupational Stress Questionnaire    Feeling of Stress : Not at all  Social Connections: Socially Integrated (01/30/2023)   Received from [facility]   Social Network    How would you rate your social network (family, work, friends)?: Good participation with social networks    Review of Systems: A 12 point ROS discussed and pertinent positives are indicated in the HPI above.  All other systems are negative.   Vital Signs: BP (!) 140/77   Pulse 69   Temp 98.2 F (36.8 C) (Oral)   Resp 18   Ht 5\' 3"  (1.6 m)   Wt 54.4 kg   SpO2 98%   BMI 21.26 kg/m   Advance Care Plan: The advanced care plan/surrogate decision maker was discussed at the time of visit and documented in the medical record.    Physical Exam Constitutional:      General: She is not in acute distress. HENT:     Head: Normocephalic.     Mouth/Throat:     Mouth: Mucous membranes are moist.     Comments: MP2 Eyes:     General: No scleral icterus. Cardiovascular:     Rate and Rhythm: Normal rate and regular rhythm.     Pulses: Normal pulses.  Pulmonary:     Effort: Pulmonary effort is normal. No  respiratory distress.  Abdominal:     General: There is no distension.  Musculoskeletal:        General: No swelling.  Skin:    General: Skin is warm and dry.  Neurological:     Mental Status: She is alert and oriented to person, place, and time.     Imaging: No results found.  Labs:  CBC: Recent Labs    02/27/23 1031 02/28/23 0424 03/01/23 0730 03/08/23 0813  WBC 7.6 8.2 10.7* 5.3  HGB 5.4* 8.7* 9.1* 9.4*  HCT 17.7* 27.2* 28.7* 29.6*  PLT 376 304 300 328    COAGS: Recent Labs    11/18/22 0357  INR 1.3*    BMP: Recent Labs    02/27/23 1031 02/28/23 0424 03/01/23 0730 03/08/23 0813  NA 135 136 131* 134*  K 4.4 4.1 3.4* 3.3*  CL 102 103 99 102  CO2 23 23 23 25   GLUCOSE 89 94 122* 123*  BUN 12 8 10 9   CALCIUM 8.7* 8.6* 8.1* 8.9  CREATININE 0.72 0.72 0.75 0.78  GFRNONAA >60 >60 >60 >60    LIVER FUNCTION TESTS: Recent Labs    02/01/23 1401 02/27/23 0735 02/28/23 0424 03/08/23 0813  BILITOT 0.3 0.3 1.2 0.3  AST 13* 11* 14* 17  ALT 12 8 11 12   ALKPHOS 72 59 54 75  PROT 8.2* 7.5 7.3 7.6  ALBUMIN 3.2* 2.9* 2.3* 2.9*    TUMOR MARKERS: Recent Labs    12/06/22 1454  CEA 6.24*    Assessment and Plan: 72 year old female with history of advanced stage lung cancer presenting for port placement.  ASA 2.   Plan for image guided port placement with moderate sedation.  Risks and benefits of image guided port-a-catheter placement was discussed with the patient including, but not limited to bleeding, infection, pneumothorax, or fibrin sheath development and need for additional procedures.  All of the patient's questions were answered, patient is agreeable to proceed. Consent signed and in chart.    Electronically Signed: Bennie Dallas, MD 03/23/2023, 4:07 PM   I spent a total of  40 Minutes  in face to face in  clinical consultation, greater than 50% of which was counseling/coordinating care for port placement.

## 2023-03-23 NOTE — Discharge Instructions (Signed)
For questions /concerns may call Interventional Radiology at [phone] or  Interventional Radiology clinic [phone]   You may remove your dressing and shower tomorrow afternoon  DO NOT use EMLA cream for 2 weeks after port placement as the cream will remove surgical glue on your incision.                                                                                         Implanted Port Insertion, Care After  The following information offers guidance on how to care for yourself after your procedure. Your health care provider may also give you more specific instructions. If you have problems or questions, contact your health care provider. What can I expect after the procedure? After the procedure, it is common to have: Discomfort at the port insertion site. Bruising on the skin over the port. This should improve over 3-4 days. Follow these instructions at home: [facility] care After your port is placed, you will get a manufacturer's information card. The card has information about your port. Keep this card with you at all times. Take care of the port as told by your health care provider. Ask your health care provider if you or a family member can get training for taking care of the port at home. A home health care nurse will be be available to help care for the port. Make sure to remember what type of port you have. Incision care  Follow instructions from your health care provider about how to take care of your port insertion site. Make sure you: Wash your hands with soap and water for at least 20 seconds before and after you change your bandage (dressing). If soap and water are not available, use hand sanitizer. Change your dressing as told by your health care provider. Leave stitches (sutures), skin glue, or adhesive strips in place. These skin closures may need to stay in place for 2 weeks or longer. If adhesive strip edges start to loosen and curl up, you may trim the loose edges. Do  not remove adhesive strips completely unless your health care provider tells you to do that. Check your port insertion site every day for signs of infection. Check for: Redness, swelling, or pain. Fluid or blood. Warmth. Pus or a bad smell. Activity Return to your normal activities as told by your health care provider. Ask your health care provider what activities are safe for you. You may have to avoid lifting. Ask your health care provider how much you can safely lift. General instructions Take over-the-counter and prescription medicines only as told by your health care provider. Do not take baths, swim, or use a hot tub until your health care provider approves. Ask your health care provider if you may take showers. You may only be allowed to take sponge baths. If you were given a sedative during the procedure, it can affect you for several hours. Do not drive or operate machinery until your health care provider says that it is safe. Wear a medical alert bracelet in case of an emergency. This will tell any health care providers that you have a port. Keep all follow-up visits.  This is important. Contact a health care provider if: You cannot flush your port with saline as directed, or you cannot draw blood from the port. You have a fever or chills. You have redness, swelling, or pain around your port insertion site. You have fluid or blood coming from your port insertion site. Your port insertion site feels warm to the touch. You have pus or a bad smell coming from the port insertion site. Get help right away if: You have chest pain or shortness of breath. You have bleeding from your port that you cannot control. These symptoms may be an emergency. Get help right away. Call 911. Do not wait to see if the symptoms will go away. Do not drive yourself to the hospital. Summary Take care of the port as told by your health care provider. Keep the manufacturer's information card with you at all  times. Change your dressing as told by your health care provider. Contact a health care provider if you have a fever or chills or if you have redness, swelling, or pain around your port insertion site. Keep all follow-up visits. This information is not intended to replace advice given to you by your health care provider. Make sure you discuss any questions you have with your health care provider.                                                                                              Moderate Conscious Sedation, Adult, Care After After the procedure, it is common to have: Sleepiness for a few hours. Impaired judgment for a few hours. Trouble with balance. Nausea or vomiting if you eat too soon. Follow these instructions at home: For the time period you were told by your health care provider:  Rest. Do not participate in activities where you could fall or become injured. Do not drive or use machinery. Do not drink alcohol. Do not take sleeping pills or medicines that cause drowsiness. Do not make important decisions or sign legal documents. Do not take care of children on your own. Eating and drinking Follow instructions from your health care provider about what you may eat and drink. Drink enough fluid to keep your urine pale yellow. If you vomit: Drink clear fluids slowly and in small amounts as you are able. Clear fluids include water, ice chips, low-calorie sports drinks, and fruit juice that has water added to it (diluted fruit juice). Eat light and bland foods in small amounts as you are able. These foods include bananas, applesauce, rice, lean meats, toast, and crackers. General instructions Take over-the-counter and prescription medicines only as told by your health care provider. Have a responsible adult stay with you for the time you are told. Do not use any products that contain nicotine or tobacco. These products include cigarettes, chewing tobacco, and vaping devices,  such as e-cigarettes. If you need help quitting, ask your health care provider. Return to your normal activities as told by your health care provider. Ask your health care provider what activities are safe for you. Your health care provider may give you more instructions. Make sure you  know what you can and cannot do. Contact a health care provider if: You are still sleepy or having trouble with balance after 24 hours. You feel light-headed. You vomit every time you eat or drink. You get a rash. You have a fever. You have redness or swelling around the IV site. Get help right away if: You have trouble breathing. You start to feel confused at home. These symptoms may be an emergency. Get help right away. Call 911. Do not wait to see if the symptoms will go away. Do not drive yourself to the hospital. This information is not intended to replace advice given to you by your health care provider. Make sure you discuss any questions you have with your health care provider. Document Revised: 12/06/2021 Document Reviewed: 12/06/2021 Elsevier Patient Education  2024 [employer].

## 2023-03-28 ENCOUNTER — Telehealth: Payer: Self-pay | Admitting: Medical Oncology

## 2023-03-28 NOTE — Telephone Encounter (Signed)
Dtr notified that Monia Pouch does not cover DME . I recommended [facility] . She said she will call her insurance.

## 2023-03-29 ENCOUNTER — Other Ambulatory Visit: Payer: Self-pay

## 2023-03-29 ENCOUNTER — Other Ambulatory Visit: Payer: Medicare HMO

## 2023-04-04 MED FILL — Fosaprepitant Dimeglumine For IV Infusion 150 MG (Base Eq): INTRAVENOUS | Qty: 5 | Status: AC

## 2023-04-05 ENCOUNTER — Inpatient Hospital Stay (HOSPITAL_BASED_OUTPATIENT_CLINIC_OR_DEPARTMENT_OTHER): Payer: Medicare HMO | Admitting: Internal Medicine

## 2023-04-05 ENCOUNTER — Inpatient Hospital Stay: Payer: Medicare HMO

## 2023-04-05 ENCOUNTER — Encounter: Payer: Self-pay | Admitting: Physician Assistant

## 2023-04-05 VITALS — BP 105/49 | HR 66 | Resp 16

## 2023-04-05 DIAGNOSIS — C3412 Malignant neoplasm of upper lobe, left bronchus or lung: Secondary | ICD-10-CM

## 2023-04-05 DIAGNOSIS — R918 Other nonspecific abnormal finding of lung field: Secondary | ICD-10-CM

## 2023-04-05 DIAGNOSIS — Z5112 Encounter for antineoplastic immunotherapy: Secondary | ICD-10-CM | POA: Diagnosis not present

## 2023-04-05 DIAGNOSIS — Z95828 Presence of other vascular implants and grafts: Secondary | ICD-10-CM | POA: Insufficient documentation

## 2023-04-05 LAB — CMP (CANCER CENTER ONLY)
ALT: 33 U/L (ref 0–44)
AST: 47 U/L — ABNORMAL HIGH (ref 15–41)
Albumin: 3.1 g/dL — ABNORMAL LOW (ref 3.5–5.0)
Alkaline Phosphatase: 193 U/L — ABNORMAL HIGH (ref 38–126)
Anion gap: 7 (ref 5–15)
BUN: 13 mg/dL (ref 8–23)
CO2: 28 mmol/L (ref 22–32)
Calcium: 9.4 mg/dL (ref 8.9–10.3)
Chloride: 104 mmol/L (ref 98–111)
Creatinine: 0.74 mg/dL (ref 0.44–1.00)
GFR, Estimated: >60 mL/min (ref >60)
Glucose, Bld: 83 mg/dL (ref 70–99)
Potassium: 3.7 mmol/L (ref 3.5–5.1)
Sodium: 139 mmol/L (ref 135–145)
Total Bilirubin: 0.4 mg/dL (ref 0.3–1.2)
Total Protein: 8 g/dL (ref 6.5–8.1)

## 2023-04-05 LAB — CBC WITH DIFFERENTIAL (CANCER CENTER ONLY)
Abs Immature Granulocytes: 0.03 10*3/uL (ref 0.00–0.07)
Basophils Absolute: 0.1 10*3/uL (ref 0.0–0.1)
Basophils Relative: 1 %
Eosinophils Absolute: 0.1 10*3/uL (ref 0.0–0.5)
Eosinophils Relative: 2 %
HCT: 31.7 % — ABNORMAL LOW (ref 36.0–46.0)
Hemoglobin: 10.1 g/dL — ABNORMAL LOW (ref 12.0–15.0)
Immature Granulocytes: 1 %
Lymphocytes Relative: 26 %
Lymphs Abs: 1.4 10*3/uL (ref 0.7–4.0)
MCH: 27.4 pg (ref 26.0–34.0)
MCHC: 31.9 g/dL (ref 30.0–36.0)
MCV: 86.1 fL (ref 80.0–100.0)
Monocytes Absolute: 0.9 10*3/uL (ref 0.1–1.0)
Monocytes Relative: 16 %
Neutro Abs: 2.9 10*3/uL (ref 1.7–7.7)
Neutrophils Relative %: 54 %
Platelet Count: 284 10*3/uL (ref 150–400)
RBC: 3.68 MIL/uL — ABNORMAL LOW (ref 3.87–5.11)
RDW: 19.4 % — ABNORMAL HIGH (ref 11.5–15.5)
WBC Count: 5.5 10*3/uL (ref 4.0–10.5)
nRBC: 0 % (ref 0.0–0.2)

## 2023-04-05 LAB — SAMPLE TO BLOOD BANK

## 2023-04-05 LAB — TSH: TSH: 1.1 u[IU]/mL (ref 0.350–4.500)

## 2023-04-05 MED ORDER — PALONOSETRON HCL INJECTION 0.25 MG/5ML
0.2500 mg | Freq: Once | INTRAVENOUS | Status: AC
  Administered 2023-04-05: 0.25 mg via INTRAVENOUS
  Filled 2023-04-05: qty 5

## 2023-04-05 MED ORDER — SODIUM CHLORIDE 0.9% FLUSH
10.0000 mL | Freq: Once | INTRAVENOUS | Status: AC
  Administered 2023-04-05: 10 mL

## 2023-04-05 MED ORDER — SODIUM CHLORIDE 0.9 % IV SOLN
200.0000 mg | Freq: Once | INTRAVENOUS | Status: AC
  Administered 2023-04-05: 200 mg via INTRAVENOUS
  Filled 2023-04-05: qty 200

## 2023-04-05 MED ORDER — SODIUM CHLORIDE 0.9% FLUSH
10.0000 mL | INTRAVENOUS | Status: DC | PRN
  Administered 2023-04-05: 10 mL

## 2023-04-05 MED ORDER — DEXAMETHASONE SODIUM PHOSPHATE 100 MG/10ML IJ SOLN: 10.0000 mg | Freq: Once | INTRAMUSCULAR | Status: DC

## 2023-04-05 MED ORDER — DEXAMETHASONE SODIUM PHOSPHATE 10 MG/ML IJ SOLN
10.0000 mg | Freq: Once | INTRAMUSCULAR | Status: AC
  Administered 2023-04-05: 10 mg via INTRAVENOUS

## 2023-04-05 MED ORDER — SODIUM CHLORIDE 0.9 % IV SOLN: Freq: Once | INTRAVENOUS | Status: AC

## 2023-04-05 MED ORDER — SODIUM CHLORIDE 0.9 % IV SOLN
150.0000 mg | Freq: Once | INTRAVENOUS | Status: AC
  Administered 2023-04-05: 150 mg via INTRAVENOUS
  Filled 2023-04-05: qty 150

## 2023-04-05 MED ORDER — SODIUM CHLORIDE 0.9 % IV SOLN
500.0000 mg/m2 | Freq: Once | INTRAVENOUS | Status: AC
Start: 2023-04-05 — End: 2023-04-05
  Administered 2023-04-05: 800 mg via INTRAVENOUS
  Filled 2023-04-05: qty 20

## 2023-04-05 MED ORDER — SODIUM CHLORIDE 0.9 % IV SOLN
347.5000 mg | Freq: Once | INTRAVENOUS | Status: AC
Start: 2023-04-05 — End: 2023-04-05
  Administered 2023-04-05: 350 mg via INTRAVENOUS
  Filled 2023-04-05: qty 35

## 2023-04-05 MED ORDER — HEPARIN SOD (PORK) LOCK FLUSH 100 UNIT/ML IV SOLN
500.0000 [IU] | Freq: Once | INTRAVENOUS | Status: AC | PRN
  Administered 2023-04-05: 500 [IU]

## 2023-04-05 MED ORDER — DEXAMETHASONE SODIUM PHOSPHATE 10 MG/ML IJ SOLN
10.0000 mg | Freq: Once | INTRAMUSCULAR | Status: DC
  Filled 2023-04-05: qty 1

## 2023-04-05 NOTE — Progress Notes (Signed)
[facility] [phone]   [phone]  OFFICE PROGRESS NOTE  Pacheco Canary, [name] [address] [place] [address]  DIAGNOSIS:  Stage IV (T1c, N0, M1b) Non-Small Cell Lung Cancer, adenocarcinoma. She presented with a spiculated left upper lobe lung nodule and large right adrenal gland mass. She was diagnosed in July 2024. She had molecular studies that showed she has KRASG12C which can be used in the second line setting.    PDL1: 1%   PRIOR THERAPY: SBRT to the left upper lobe and right adrenal targets under the care of Dr. Mitzi Hansen.  Last dose of treatment expected on 02/20/2023   CURRENT THERAPY: Systemic chemotherapy with carboplatin for AUC of 5, Alimta 500 Mg/M2 and Keytruda 200 Mg IV every 3 weeks.  First dose April 05, 2023.  INTERVAL HISTORY: Pacheco Pacheco 72 y.o. female returns to the clinic today for follow-up visit accompanied by her daughter. Discussed the use of AI scribe software for clinical note transcription with the patient, who gave verbal consent to proceed.  History of Present Illness   The patient, a 72 year old individual with stage four non-small cell lung adenocarcinoma (adenocarcinoma subtype), was diagnosed in July 2024. The initiation of chemotherapy was delayed due to the need for port placement. The patient reports feeling physically well with no complaints of nausea, vomiting, diarrhea, chest pain, or breathing issues. There has been a slight weight loss of approximately one pound since the last visit.      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  MEDICATIONS:  Current Outpatient Medications  Medication Sig Dispense  Refill   acetaminophen (TYLENOL) 325 MG tablet Take 2 tablets (650 mg total) by mouth every 6 (six) hours as needed for mild pain (or Fever >/= 101).     apixaban (ELIQUIS) 5 MG TABS tablet Take 1 tablet (5 mg total) by mouth 2 (two) times daily. 60 tablet 5   Fluticasone-Umeclidin-Vilant (TRELEGY ELLIPTA) 100-62.5-25 MCG/ACT AEPB Inhale 1 each into the lungs daily.     folic acid (FOLVITE) 1 MG tablet Take 1 tablet (1 mg total) by mouth daily. Start 7 days before pemetrexed chemotherapy. Continue until 21 days after pemetrexed completed. 100 tablet 3   lidocaine-prilocaine (EMLA) cream Apply to the Port-A-Cath site 30 minutes before treatment. 30 g 0   metoprolol succinate (TOPROL-XL) 50 MG 24 hr tablet TAKE 1 TABLET BY MOUTH DAILY. TAKE WITH OR IMMEDIATELY FOLLOWING A MEAL. 60 tablet 5   Multiple Vitamin (MULTIVITAMIN) tablet Take 1 tablet by mouth daily.       ondansetron (ZOFRAN) 8 MG tablet Take 1 tablet (8 mg total) by mouth every 8 (eight) hours as needed for nausea or vomiting. 30 tablet 0   oxyCODONE (OXY IR/ROXICODONE) 5 MG immediate release tablet Take 1 tablet (5 mg total) by mouth every 6 (six) hours as needed for moderate pain. 60 tablet 0   pantoprazole (PROTONIX) 40 MG tablet Take 1 tablet (40 mg total) by mouth daily before breakfast. 30 tablet 3   Polyethyl Glycol-Propyl Glycol (SYSTANE OP) Place 1 drop into both eyes daily as needed (dry eye).  prochlorperazine (COMPAZINE) 10 MG tablet Take 1 tablet (10 mg total) by mouth every 6 (six) hours as needed for nausea or vomiting. 30 tablet 1   sacubitril-valsartan (ENTRESTO) 49-51 MG Take 1 tablet by mouth 2 (two) times daily. 60 tablet 5   spironolactone (ALDACTONE) 25 MG tablet Take 0.5 tablets (12.5 mg total) by mouth daily. (Patient not taking: Reported on 03/22/2023) 30 tablet 1   No current facility-administered medications for this visit.    SURGICAL HISTORY:  Past Surgical History:  Procedure Laterality Date   ABDOMINAL  HYSTERECTOMY     BILATERAL OOPHORECTOMY     BIOPSY  12/09/2022   Procedure: BIOPSY;  Surgeon: Imogene Burn, MD;  Location[facility] ENDOSCOPY;  Service: Gastroenterology;;   BRONCHIAL BIOPSY  01/09/2023   Procedure: BRONCHIAL BIOPSIES;  Surgeon: Josephine Igo, DO;  Location: [facility] ENDOSCOPY;  Service: Pulmonary;;   BRONCHIAL NEEDLE ASPIRATION BIOPSY  01/09/2023   Procedure: BRONCHIAL NEEDLE ASPIRATION BIOPSIES;  Surgeon: Josephine Igo, DO;  Location: [facility] ENDOSCOPY;  Service: Pulmonary;;   CATARACT EXTRACTION W/PHACO  12/27/2010   Procedure: CATARACT EXTRACTION PHACO AND INTRAOCULAR LENS PLACEMENT (IOC);  Surgeon: Gemma Payor;  Location: [facility] ORS;  Service: Ophthalmology;  Laterality: Right;   CATARACT EXTRACTION W/PHACO  03/28/2011   Procedure: CATARACT EXTRACTION PHACO AND INTRAOCULAR LENS PLACEMENT (IOC);  Surgeon: Gemma Payor;  Location: [facility] ORS;  Service: Ophthalmology;  Laterality: Left;  CDE 7.27   ENTEROSCOPY N/A 02/28/2023   Procedure: ENTEROSCOPY;  Surgeon: Iva Boop, MD;  Location: [facility] ENDOSCOPY;  Service: Gastroenterology;  Laterality: N/A;   ESOPHAGOGASTRODUODENOSCOPY (EGD) WITH PROPOFOL N/A 12/09/2022   Procedure: ESOPHAGOGASTRODUODENOSCOPY (EGD) WITH PROPOFOL;  Surgeon: Imogene Burn, MD;  Location[facility] ENDOSCOPY;  Service: Gastroenterology;  Laterality: N/A;   HOT HEMOSTASIS N/A 02/28/2023   Procedure: HOT HEMOSTASIS (ARGON PLASMA COAGULATION/BICAP);  Surgeon: Iva Boop, MD;  Location: [facility] ENDOSCOPY;  Service: Gastroenterology;  Laterality: N/A;   IR IMAGING GUIDED PORT INSERTION  03/23/2023   SUBMUCOSAL TATTOO INJECTION  02/28/2023   Procedure: SUBMUCOSAL TATTOO INJECTION;  Surgeon: Iva Boop, MD;  Location: [facility] ENDOSCOPY;  Service: Gastroenterology;;    REVIEW OF SYSTEMS:  A comprehensive review of systems was negative except for: Constitutional: positive for fatigue   PHYSICAL EXAMINATION: General appearance: alert, cooperative, fatigued, and no distress Head: Normocephalic,  without obvious abnormality, atraumatic Neck: no adenopathy, no JVD, supple, symmetrical, trachea midline, and thyroid not enlarged, symmetric, no tenderness/mass/nodules Lymph nodes: Cervical, supraclavicular, and axillary nodes normal. Resp: clear to auscultation bilaterally Back: symmetric, no curvature. ROM normal. No CVA tenderness. Cardio: regular rate and rhythm, S1, S2 normal, no murmur, click, rub or gallop GI: soft, non-tender; bowel sounds normal; no masses,  no organomegaly Extremities: extremities normal, atraumatic, no cyanosis or edema  ECOG PERFORMANCE STATUS: 1 - Symptomatic but completely ambulatory  Blood pressure (!) 159/76, pulse 73, temperature 98.5 F (36.9 C), temperature source Oral, resp. rate 16, height 5\' 3"  (1.6 m), weight 118 lb 11.2 oz (53.8 kg), SpO2 100%.  LABORATORY DATA: Lab Results  Component Value Date   WBC 5.5 04/05/2023   HGB 10.1 (L) 04/05/2023   HCT 31.7 (L) 04/05/2023   MCV 86.1 04/05/2023   PLT 284 04/05/2023      Chemistry      Component Value Date/Time   NA 134 (L) 03/08/2023 0813   K 3.3 (L) 03/08/2023 0813   CL 102 03/08/2023 0813   CO2 25 03/08/2023 0813   BUN 9 03/08/2023 0813  CREATININE 0.78 03/08/2023 0813      Component Value Date/Time   CALCIUM 8.9 03/08/2023 0813   ALKPHOS 75 03/08/2023 0813   AST 17 03/08/2023 0813   ALT 12 03/08/2023 0813   BILITOT 0.3 03/08/2023 0813       RADIOGRAPHIC STUDIES: IR IMAGING GUIDED PORT INSERTION  Result Date: 03/23/2023 INDICATION: 72 year old female with advanced stage left lung cancer presenting for Port-A-Cath placement. EXAM: IMPLANTED PORT A CATH PLACEMENT WITH ULTRASOUND AND FLUOROSCOPIC GUIDANCE COMPARISON:  None Available. MEDICATIONS: None. ANESTHESIA/SEDATION: Moderate (conscious) sedation was employed during this procedure. A total of Versed 1 mg and Fentanyl 50 mcg was administered intravenously. Moderate Sedation Time: 20 minutes. The patient's level of  consciousness and vital signs were monitored continuously by radiology nursing throughout the procedure under my direct supervision. CONTRAST:  None FLUOROSCOPY TIME:  6 seconds, 0 mGy COMPLICATIONS: None immediate. PROCEDURE: The procedure, risks, benefits, and alternatives were explained to the patient. Questions regarding the procedure were encouraged and answered. The patient understands and consents to the procedure. The right neck and chest were prepped with chlorhexidine in a sterile fashion, and a sterile drape was applied covering the operative field. Maximum barrier sterile technique with sterile gowns and gloves were used for the procedure. A timeout was performed prior to the initiation of the procedure. Ultrasound was used to examine the jugular vein which was compressible and free of internal echoes. A skin marker was used to demarcate the planned venotomy and port pocket incision sites. Local anesthesia was provided to these sites and the subcutaneous tunnel track with 1% lidocaine with 1:100,000 epinephrine. A small incision was created at the jugular access site and blunt dissection was performed of the subcutaneous tissues. Under ultrasound guidance, the jugular vein was accessed with a 21 ga micropuncture needle and an 0.018" wire was inserted to the superior vena cava. Real-time ultrasound guidance was utilized for vascular access including the acquisition of a permanent ultrasound image documenting patency of the accessed vessel. A 5 Fr micopuncture set was then used, through which a 0.035" Rosen wire was passed under fluoroscopic guidance into the inferior vena cava. An 8 Fr dilator was then placed over the wire. A subcutaneous port pocket was then created along the upper chest wall utilizing a combination of sharp and blunt dissection. The pocket was irrigated with sterile saline, packed with gauze, and observed for hemorrhage. A single lumen plastic power injectable port was chosen for  placement. The 8 Fr catheter was tunneled from the port pocket site to the venotomy incision. The port was placed in the pocket. The external catheter was trimmed to appropriate length. The dilator was exchanged for an 8 Fr peel-away sheath under fluoroscopic guidance. The catheter was then placed through the sheath and the sheath was removed. Final catheter positioning was confirmed and documented with a fluoroscopic spot radiograph. The port was accessed with a Huber needle, aspirated, and flushed with heparinized saline. The deep dermal layer of the port pocket incision was closed with interrupted 3-0 Vicryl suture. Dermabond was then placed over the port pocket and neck incisions. The patient tolerated the procedure well without immediate post procedural complication. FINDINGS: After catheter placement, the tip lies within the superior cavoatrial junction. The catheter aspirates and flushes normally and is ready for immediate use. IMPRESSION: Successful placement of a power injectable Port-A-Cath via the right internal jugular vein. The catheter is ready for immediate use. Marliss Coots, MD [facility] Electronically Signed  By: Marliss Coots M.D.   On: 03/23/2023 16:45    ASSESSMENT AND PLAN: This is a very pleasant 72 years old African-American female recently diagnosed with a stage IV (T1c, N0, M1b) non-small cell lung cancer, adenocarcinoma with positive KRAS G12C mutation and PD-L1 expression of 1%.  She is here today to start the first cycle of systemic chemotherapy with carboplatin for AUC of 5, Alimta 500 Mg/M2 and Keytruda 200 Mg IV every 3 weeks.    Stage IV Non-Small Cell Lung Cancer (Adenocarcinoma) Diagnosed in July 2024, port placement completed recently. Patient is physically well with no complaints of nausea, vomiting, diarrhea, chest pain, or breathing issues. Minimal weight loss reported. Blood work is within normal limits for  chemotherapy. - Proceed with first round of chemotherapy today. - Follow-up in three weeks for the second round of chemotherapy. - Weekly labs to monitor blood counts and overall health.  The patient was advised to call immediately if she has any other concerning symptoms in the interval. The patient voices understanding of current disease status and treatment options and is in agreement with the current care plan.  All questions were answered. The patient knows to call the clinic with any problems, questions or concerns. We can certainly see the patient much sooner if necessary. The total time spent in the appointment was 20 minutes.  Disclaimer: This note was dictated with voice recognition software. Similar sounding words can inadvertently be transcribed and may not be corrected upon review.

## 2023-04-05 NOTE — Patient Instructions (Signed)
[facility]  Discharge Instructions: Thank you for choosing [facility] to provide your oncology and hematology care.   If you have a lab appointment with the Cancer Center, please go directly to the Cancer Center and check in at the registration area.   Wear comfortable clothing and clothing appropriate for easy access to any Portacath or PICC line.   We strive to give you quality time with your provider. You may need to reschedule your appointment if you arrive late (15 or more minutes).  Arriving late affects you and other patients whose appointments are after yours.  Also, if you miss three or more appointments without notifying the office, you may be dismissed from the clinic at the provider's discretion.      For prescription refill requests, have your pharmacy contact our office and allow 72 hours for refills to be completed.    Today you received the following chemotherapy and/or immunotherapy agents: Pembrolizumab (Keytruda), Pemetrexed (Alimta), & Carboplatin      To help prevent nausea and vomiting after your treatment, we encourage you to take your nausea medication as directed.  BELOW ARE SYMPTOMS THAT SHOULD BE REPORTED IMMEDIATELY: *FEVER GREATER THAN 100.4 F (38 C) OR HIGHER *CHILLS OR SWEATING *NAUSEA AND VOMITING THAT IS NOT CONTROLLED WITH YOUR NAUSEA MEDICATION *UNUSUAL SHORTNESS OF BREATH *UNUSUAL BRUISING OR BLEEDING *URINARY PROBLEMS (pain or burning when urinating, or frequent urination) *BOWEL PROBLEMS (unusual diarrhea, constipation, pain near the anus) TENDERNESS IN MOUTH AND THROAT WITH OR WITHOUT PRESENCE OF ULCERS (sore throat, sores in mouth, or a toothache) UNUSUAL RASH, SWELLING OR PAIN  UNUSUAL VAGINAL DISCHARGE OR ITCHING   Items with * indicate a potential emergency and should be followed up as soon as possible or go to the Emergency Department if any problems should occur.  Please show the  CHEMOTHERAPY ALERT CARD or IMMUNOTHERAPY ALERT CARD at check-in to the Emergency Department and triage nurse.  Should you have questions after your visit or need to cancel or reschedule your appointment, please contact [facility]  Dept: [phone]  and follow the prompts.  Office hours are 8:00 a.m. to 4:30 p.m. Monday - Friday. Please note that voicemails left after 4:00 p.m. may not be returned until the following business day.  We are closed weekends and major holidays. You have access to a nurse at all times for urgent questions. Please call the main number to the clinic Dept: [phone] and follow the prompts.   For any non-urgent questions, you may also contact your provider using MyChart. We now offer e-Visits for anyone 50 and older to request care online for non-urgent symptoms. For details visit [email or web address].   Also download the MyChart app! Go to the app store, search "MyChart", open the app, select [facility], and log in with your MyChart username and password.

## 2023-04-06 ENCOUNTER — Telehealth: Payer: Self-pay

## 2023-04-06 LAB — T4: T4, Total: 10.3 ug/dL (ref 4.5–12.0)

## 2023-04-06 NOTE — Telephone Encounter (Signed)
-----   Message from Nurse Reece Levy sent at 04/05/2023 12:24 PM EDT ----- Regarding: Dr. Arbutus Ped 1st time Keytruda/Alimta/Carbo f/u tol well Dr. Arbutus Ped 1st time Keytruda/Alimta/Carboplatin. Pt tolerated tx well without incident. Pt call back due.

## 2023-04-06 NOTE — Telephone Encounter (Signed)
Erin Pacheco states that she is doing fine. She is eating, drinking, and urinating well. She knows to call the office at [phone] if  she has any questions or concerns.

## 2023-04-08 NOTE — Progress Notes (Unsigned)
[facility] OFFICE PROGRESS NOTE  Erin Canary, [name] [address] [place] [address]  DIAGNOSIS: Stage IV (T1c, N0, M1b) Non-Small Cell Lung Cancer, adenocarcinoma. She presented with a spiculated left upper lobe lung nodule and large right adrenal gland mass. She was diagnosed in July 2024. She had molecular studies that showed she has KRASG12C which can be used in the second line setting.    PDL1: 1%   Molecular Studies: STK11 but likely not candidate for 4 drug combination and KRASG12C  PRIOR THERAPY: SBRT to the left upper lobe and right adrenal targets under the care of Dr. Mitzi Hansen. Last dose of treatment expected on 02/20/2023   CURRENT THERAPY:  1) Systemic chemotherapy with carboplatin for AUC of 5, Alimta 500 Mg/M2 and Keytruda 200 Mg IV every 3 weeks.  First dose April 05, 2023. Status post 1 cycle.  2) IV iron with Venofer 300 mg weekly PRN. Last dose on 03/21/23.   INTERVAL HISTORY: Erin Pacheco 72 y.o. female returns to the clinic today for a follow up visit accompanied by her daughter Erin Pacheco. She underwent her first cycle of systemic chemotherapy last week and tolerated it well without any appreciable adverse side effects. Around when she was diagnosed with lung cancer, she had been struggling with anemia secondary to GI bleeding.  This was treated during endoscopic evaluation.  Of note the patient is on Eliquis for history of blood clot in Pacheco 2024.  She was treated with IV iron with venofer.  She is not taking her oral iron supplement because they thought that she no longer needed this.  Her appetite is improving she lost 2 pounds last week but gained it back.  She is drinking Ensure once to 2 times per day.  She reports her breathing is "fine".  Denies any significant shortness of breath or significant cough.  Denies any fever, chills, or night sweats.  She denies any chest pain or hemoptysis.  She did not have any nausea or vomiting  last week.  Denies any diarrhea or constipation.  Denies any headache or visual changes.  She has some neutropenia on labs but does not have any evidence of infection at this time including urinary tract infection, skin infections, or URI.  She is here today for repeat blood work and to manage any adverse side effects of treatment.     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  MEDICATIONS:  Current Outpatient Medications  Medication Sig Dispense Refill   acetaminophen (TYLENOL) 325 MG tablet Take 2 tablets (650 mg total) by mouth every 6 (six) hours as needed for mild pain (or Fever >/= 101).     apixaban (ELIQUIS) 5 MG TABS tablet Take 1 tablet (5 mg total) by mouth 2 (two) times daily. 60 tablet 5   Fluticasone-Umeclidin-Vilant (TRELEGY ELLIPTA) 100-62.5-25 MCG/ACT AEPB Inhale 1 each into the lungs daily.     folic acid (FOLVITE) 1 MG tablet Take 1 tablet (1 mg total) by mouth daily. Start 7 days before pemetrexed chemotherapy. Continue until 21 days after pemetrexed completed. 100 tablet 3   lidocaine-prilocaine (EMLA) cream Apply to the Port-A-Cath site 30 minutes  before treatment. 30 g 0   metoprolol succinate (TOPROL-XL) 50 MG 24 hr tablet TAKE 1 TABLET BY MOUTH DAILY. TAKE WITH OR IMMEDIATELY FOLLOWING A MEAL. 60 tablet 5   Multiple Vitamin (MULTIVITAMIN) tablet Take 1 tablet by mouth daily.       ondansetron (ZOFRAN) 8 MG tablet Take 1 tablet (8 mg total) by mouth every 8 (eight) hours as needed for nausea or vomiting. 30 tablet 0   oxyCODONE (OXY IR/ROXICODONE) 5 MG immediate release tablet Take 1 tablet (5 mg total) by mouth every 6 (six) hours as needed for moderate pain. 60 tablet 0   pantoprazole (PROTONIX)  40 MG tablet Take 1 tablet (40 mg total) by mouth daily before breakfast. 30 tablet 3   Polyethyl Glycol-Propyl Glycol (SYSTANE OP) Place 1 drop into both eyes daily as needed (dry eye).     prochlorperazine (COMPAZINE) 10 MG tablet Take 1 tablet (10 mg total) by mouth every 6 (six) hours as needed for nausea or vomiting. 30 tablet 1   sacubitril-valsartan (ENTRESTO) 49-51 MG Take 1 tablet by mouth 2 (two) times daily. 60 tablet 5   spironolactone (ALDACTONE) 25 MG tablet Take 0.5 tablets (12.5 mg total) by mouth daily. (Patient not taking: Reported on 03/22/2023) 30 tablet 1   No current facility-administered medications for this visit.    SURGICAL HISTORY:  Past Surgical History:  Procedure Laterality Date   ABDOMINAL HYSTERECTOMY     BILATERAL OOPHORECTOMY     BIOPSY  12/09/2022   Procedure: BIOPSY;  Surgeon: Imogene Burn, MD;  Location: [facility] ENDOSCOPY;  Service: Gastroenterology;;   BRONCHIAL BIOPSY  01/09/2023   Procedure: BRONCHIAL BIOPSIES;  Surgeon: Josephine Igo, DO;  Location: [facility] ENDOSCOPY;  Service: Pulmonary;;   BRONCHIAL NEEDLE ASPIRATION BIOPSY  01/09/2023   Procedure: BRONCHIAL NEEDLE ASPIRATION BIOPSIES;  Surgeon: Josephine Igo, DO;  Location: [facility] ENDOSCOPY;  Service: Pulmonary;;   CATARACT EXTRACTION W/PHACO  12/27/2010   Procedure: CATARACT EXTRACTION PHACO AND INTRAOCULAR LENS PLACEMENT (IOC);  Surgeon: Gemma Payor;  Location: [facility] ORS;  Service: Ophthalmology;  Laterality: Right;   CATARACT EXTRACTION W/PHACO  03/28/2011   Procedure: CATARACT EXTRACTION PHACO AND INTRAOCULAR LENS PLACEMENT (IOC);  Surgeon: Gemma Payor;  Location: [facility] ORS;  Service: Ophthalmology;  Laterality: Left;  CDE 7.27   ENTEROSCOPY N/A 02/28/2023   Procedure: ENTEROSCOPY;  Surgeon: Iva Boop, MD;  Location: [facility] ENDOSCOPY;  Service: Gastroenterology;  Laterality: N/A;   ESOPHAGOGASTRODUODENOSCOPY (EGD) WITH PROPOFOL N/A 12/09/2022   Procedure: ESOPHAGOGASTRODUODENOSCOPY (EGD) WITH PROPOFOL;  Surgeon:  Imogene Burn, MD;  Location: [facility] ENDOSCOPY;  Service: Gastroenterology;  Laterality: N/A;   HOT HEMOSTASIS N/A 02/28/2023   Procedure: HOT HEMOSTASIS (ARGON PLASMA COAGULATION/BICAP);  Surgeon: Iva Boop, MD;  Location: [facility] ENDOSCOPY;  Service: Gastroenterology;  Laterality: N/A;   IR IMAGING GUIDED PORT INSERTION  03/23/2023   SUBMUCOSAL TATTOO INJECTION  02/28/2023   Procedure: SUBMUCOSAL TATTOO INJECTION;  Surgeon: Iva Boop, MD;  Location: [facility] ENDOSCOPY;  Service: Gastroenterology;;    REVIEW OF SYSTEMS:   Review of Systems  Constitutional: Positive for stable fatigue. Negative for chills, fatigue, fever and unexpected weight change.  HENT: Negative for mouth sores, nosebleeds, sore throat and trouble swallowing.   Eyes: Negative for eye problems and icterus.  Respiratory: Negative for cough, hemoptysis, shortness of breath and wheezing.   Cardiovascular: Negative for chest pain and leg swelling.  Gastrointestinal: Negative for abdominal pain, constipation, diarrhea, nausea and vomiting.  Genitourinary: Negative for bladder incontinence,  difficulty urinating, dysuria, frequency and hematuria.   Musculoskeletal: Negative for back pain, gait problem, neck pain and neck stiffness.  Skin: Negative for itching and rash.  Neurological: Negative for dizziness, extremity weakness, gait problem, headaches, light-headedness and seizures.  Hematological: Negative for adenopathy. Does not bruise/bleed easily.  Psychiatric/Behavioral: Negative for confusion, depression and sleep disturbance. The patient is not nervous/anxious.     PHYSICAL EXAMINATION:  There were no vitals taken for this visit.  ECOG PERFORMANCE STATUS: 2  Physical Exam  Constitutional: Oriented to person, place, and time and thin appearing female and, and in no distress. HENT:  Head: Normocephalic and atraumatic.  Mouth/Throat: Oropharynx is clear and moist. No oropharyngeal exudate.  Eyes: Conjunctivae are  normal. Right eye exhibits no discharge. Left eye exhibits no discharge. No scleral icterus.  Neck: Normal range of motion. Neck supple.  Cardiovascular: Normal rate, regular rhythm, normal heart sounds and intact distal pulses.   Pulmonary/Chest: Effort normal and breath sounds normal. No respiratory distress. No wheezes. No rales.  Abdominal: Soft. Bowel sounds are normal. Exhibits no distension and no mass. There is no tenderness.  Musculoskeletal: Normal range of motion. Exhibits no edema.  Lymphadenopathy:    No cervical adenopathy.  Neurological: Alert and oriented to person, place, and time. Exhibits muscle wasting.  Examined in the wheelchair.  Skin: Skin is warm and dry. No rash noted. Not diaphoretic. No erythema. No pallor.  Psychiatric: Mood, memory and judgment normal.  Vitals reviewed.  LABORATORY DATA: Lab Results  Component Value Date   WBC 5.5 04/05/2023   HGB 10.1 (L) 04/05/2023   HCT 31.7 (L) 04/05/2023   MCV 86.1 04/05/2023   PLT 284 04/05/2023      Chemistry      Component Value Date/Time   NA 139 04/05/2023 0805   K 3.7 04/05/2023 0805   CL 104 04/05/2023 0805   CO2 28 04/05/2023 0805   BUN 13 04/05/2023 0805   CREATININE 0.74 04/05/2023 0805      Component Value Date/Time   CALCIUM 9.4 04/05/2023 0805   ALKPHOS 193 (H) 04/05/2023 0805   AST 47 (H) 04/05/2023 0805   ALT 33 04/05/2023 0805   BILITOT 0.4 04/05/2023 0805       RADIOGRAPHIC STUDIES:  IR IMAGING GUIDED PORT INSERTION  Result Date: 03/23/2023 INDICATION: 72 year old female with advanced stage left lung cancer presenting for Port-A-Cath placement. EXAM: IMPLANTED PORT A CATH PLACEMENT WITH ULTRASOUND AND FLUOROSCOPIC GUIDANCE COMPARISON:  None Available. MEDICATIONS: None. ANESTHESIA/SEDATION: Moderate (conscious) sedation was employed during this procedure. A total of Versed 1 mg and Fentanyl 50 mcg was administered intravenously. Moderate Sedation Time: 20 minutes. The patient's level  of consciousness and vital signs were monitored continuously by radiology nursing throughout the procedure under my direct supervision. CONTRAST:  None FLUOROSCOPY TIME:  6 seconds, 0 mGy COMPLICATIONS: None immediate. PROCEDURE: The procedure, risks, benefits, and alternatives were explained to the patient. Questions regarding the procedure were encouraged and answered. The patient understands and consents to the procedure. The right neck and chest were prepped with chlorhexidine in a sterile fashion, and a sterile drape was applied covering the operative field. Maximum barrier sterile technique with sterile gowns and gloves were used for the procedure. A timeout was performed prior to the initiation of the procedure. Ultrasound was used to examine the jugular vein which was compressible and free of internal echoes. A skin marker was used to demarcate the planned venotomy and port pocket incision sites. Local anesthesia was provided  to these sites and the subcutaneous tunnel track with 1% lidocaine with 1:100,000 epinephrine. A small incision was created at the jugular access site and blunt dissection was performed of the subcutaneous tissues. Under ultrasound guidance, the jugular vein was accessed with a 21 ga micropuncture needle and an 0.018" wire was inserted to the superior vena cava. Real-time ultrasound guidance was utilized for vascular access including the acquisition of a permanent ultrasound image documenting patency of the accessed vessel. A 5 Fr micopuncture set was then used, through which a 0.035" Rosen wire was passed under fluoroscopic guidance into the inferior vena cava. An 8 Fr dilator was then placed over the wire. A subcutaneous port pocket was then created along the upper chest wall utilizing a combination of sharp and blunt dissection. The pocket was irrigated with sterile saline, packed with gauze, and observed for hemorrhage. A single lumen plastic power injectable port was chosen for  placement. The 8 Fr catheter was tunneled from the port pocket site to the venotomy incision. The port was placed in the pocket. The external catheter was trimmed to appropriate length. The dilator was exchanged for an 8 Fr peel-away sheath under fluoroscopic guidance. The catheter was then placed through the sheath and the sheath was removed. Final catheter positioning was confirmed and documented with a fluoroscopic spot radiograph. The port was accessed with a Huber needle, aspirated, and flushed with heparinized saline. The deep dermal layer of the port pocket incision was closed with interrupted 3-0 Vicryl suture. Dermabond was then placed over the port pocket and neck incisions. The patient tolerated the procedure well without immediate post procedural complication. FINDINGS: After catheter placement, the tip lies within the superior cavoatrial junction. The catheter aspirates and flushes normally and is ready for immediate use. IMPRESSION: Successful placement of a power injectable Port-A-Cath via the right internal jugular vein. The catheter is ready for immediate use. Marliss Coots, MD [facility] Radiology Electronically Signed   By: Marliss Coots M.D.   On: 03/23/2023 16:45     ASSESSMENT/PLAN:  This is a very pleasant 72 year old female with stage IV (T1c, N0, M1) Non-Small Cell Lung Cancer, adenocarcinoma. She presented with a spiculated left upper lobe lung nodule and large right adrenal gland mass. She was diagnosed in July 2024. She had molecular studies that showed she has KRASG12C which can be used in the second line setting.  PDL1 1%, she has STK 11.  Dr. Arbutus Ped does not feel that the patient is not a good candidate for the 4 drug combination and her performance status.   She underwent radiation to the adrenal lesion and the lung under the care of Dr. Mitzi Hansen. The last dose was on 02/20/23  She is currently on palliative chemotherapy with  carboplatin for an AUC of 5, Alimta 500 mg/m2 and immunotherapy with Keytruda 200 mg IV every 3 weeks. First dose on 04/05/23 which she tolerated well except for neutropenia. .    Labs were reviewed.  Her total white blood cell count is 1.3.  Her ANC is pending at this time.  I talked to the authorization team and she is approved for Zarzio if needed.  I would recommend giving her Zarzio daily x 3 if her ANC is less than 0.6.  We also reviewed neutropenic precautions.  The patient was advised to seek medical evaluation immediately if she develops any signs of infection.  I also cautioned her to avoid large crowds, wash her fruits and  vegetables well, and avoid raw food.  The patient does live in [place].  If she requires Zarzio injections, I will reach out to [facility] to see if she can be scheduled for her daily injections at [facility].   We will see her back in 2 weeks for labs and follow up before undergoing cycle #2.    We will monitor her anemia closely and consider blood transfusion if her Hbg is <8.   I encouraged her to resume taking her iron supplement daily or every other day.  She does not need a blood transfusion at this time.  Her LFTs are slightly elevated.  Will continue to monitor this closely.  We will let the patient know to avoid Tylenol.   The patient was advised to call immediately if she has any concerning symptoms in the interval. The patient voices understanding of current disease status and treatment options and is in agreement with the current care plan. All questions were answered. The patient knows to call the clinic with any problems, questions or concerns. We can certainly see the patient much sooner if necessary   No orders of the defined types were placed in this encounter.   The total time spent in the appointment was 30-39 minutes  Neythan Kozlov L Ahonesty Woodfin, [name] 04/08/23

## 2023-04-11 ENCOUNTER — Ambulatory Visit: Payer: Medicare HMO | Admitting: Cardiology

## 2023-04-12 ENCOUNTER — Other Ambulatory Visit: Payer: Medicare HMO

## 2023-04-12 ENCOUNTER — Inpatient Hospital Stay: Payer: Medicare HMO

## 2023-04-12 ENCOUNTER — Inpatient Hospital Stay: Payer: Medicare HMO | Admitting: Physician Assistant

## 2023-04-12 ENCOUNTER — Encounter: Payer: Self-pay | Admitting: Internal Medicine

## 2023-04-12 ENCOUNTER — Inpatient Hospital Stay: Payer: Medicare HMO | Attending: Physician Assistant

## 2023-04-12 VITALS — BP 117/65 | HR 73 | Resp 18 | Ht 63.0 in | Wt 120.5 lb

## 2023-04-12 DIAGNOSIS — C3412 Malignant neoplasm of upper lobe, left bronchus or lung: Secondary | ICD-10-CM | POA: Diagnosis not present

## 2023-04-12 DIAGNOSIS — E279 Disorder of adrenal gland, unspecified: Secondary | ICD-10-CM | POA: Diagnosis not present

## 2023-04-12 DIAGNOSIS — D701 Agranulocytosis secondary to cancer chemotherapy: Secondary | ICD-10-CM

## 2023-04-12 DIAGNOSIS — D649 Anemia, unspecified: Secondary | ICD-10-CM | POA: Insufficient documentation

## 2023-04-12 DIAGNOSIS — Z5111 Encounter for antineoplastic chemotherapy: Secondary | ICD-10-CM | POA: Insufficient documentation

## 2023-04-12 DIAGNOSIS — Z5112 Encounter for antineoplastic immunotherapy: Secondary | ICD-10-CM | POA: Insufficient documentation

## 2023-04-12 DIAGNOSIS — Z95828 Presence of other vascular implants and grafts: Secondary | ICD-10-CM

## 2023-04-12 DIAGNOSIS — E278 Other specified disorders of adrenal gland: Secondary | ICD-10-CM

## 2023-04-12 DIAGNOSIS — T451X5A Adverse effect of antineoplastic and immunosuppressive drugs, initial encounter: Secondary | ICD-10-CM

## 2023-04-12 DIAGNOSIS — R918 Other nonspecific abnormal finding of lung field: Secondary | ICD-10-CM

## 2023-04-12 LAB — CBC WITH DIFFERENTIAL (CANCER CENTER ONLY)
Abs Immature Granulocytes: 0 10*3/uL (ref 0.00–0.07)
Basophils Absolute: 0 10*3/uL (ref 0.0–0.1)
Basophils Relative: 1 %
Eosinophils Absolute: 0 10*3/uL (ref 0.0–0.5)
Eosinophils Relative: 3 %
HCT: 27.8 % — ABNORMAL LOW (ref 36.0–46.0)
Hemoglobin: 8.9 g/dL — ABNORMAL LOW (ref 12.0–15.0)
Immature Granulocytes: 0 %
Lymphocytes Relative: 48 %
Lymphs Abs: 0.6 10*3/uL — ABNORMAL LOW (ref 0.7–4.0)
MCH: 27.5 pg (ref 26.0–34.0)
MCHC: 32 g/dL (ref 30.0–36.0)
MCV: 85.8 fL (ref 80.0–100.0)
Monocytes Absolute: 0.2 10*3/uL (ref 0.1–1.0)
Monocytes Relative: 14 %
Neutro Abs: 0.5 10*3/uL — ABNORMAL LOW (ref 1.7–7.7)
Neutrophils Relative %: 34 %
Platelet Count: 166 10*3/uL (ref 150–400)
RBC: 3.24 MIL/uL — ABNORMAL LOW (ref 3.87–5.11)
RDW: 18.8 % — ABNORMAL HIGH (ref 11.5–15.5)
Smear Review: NORMAL
WBC Count: 1.3 10*3/uL — ABNORMAL LOW (ref 4.0–10.5)
nRBC: 0 % (ref 0.0–0.2)

## 2023-04-12 LAB — CMP (CANCER CENTER ONLY)
ALT: 75 U/L — ABNORMAL HIGH (ref 0–44)
AST: 63 U/L — ABNORMAL HIGH (ref 15–41)
Albumin: 3.1 g/dL — ABNORMAL LOW (ref 3.5–5.0)
Alkaline Phosphatase: 232 U/L — ABNORMAL HIGH (ref 38–126)
Anion gap: 4 — ABNORMAL LOW (ref 5–15)
BUN: 27 mg/dL — ABNORMAL HIGH (ref 8–23)
CO2: 31 mmol/L (ref 22–32)
Calcium: 9.2 mg/dL (ref 8.9–10.3)
Chloride: 103 mmol/L (ref 98–111)
Creatinine: 0.73 mg/dL (ref 0.44–1.00)
GFR, Estimated: >60 mL/min (ref >60)
Glucose, Bld: 94 mg/dL (ref 70–99)
Potassium: 3.8 mmol/L (ref 3.5–5.1)
Sodium: 138 mmol/L (ref 135–145)
Total Bilirubin: 0.4 mg/dL (ref <1.2)
Total Protein: 7.5 g/dL (ref 6.5–8.1)

## 2023-04-12 LAB — SAMPLE TO BLOOD BANK

## 2023-04-12 MED ORDER — SODIUM CHLORIDE 0.9% FLUSH
10.0000 mL | Freq: Once | INTRAVENOUS | Status: AC
  Administered 2023-04-12: 10 mL

## 2023-04-12 MED ORDER — FILGRASTIM-SNDZ 300 MCG/0.5ML IJ SOSY
300.0000 ug | PREFILLED_SYRINGE | Freq: Once | INTRAMUSCULAR | Status: AC
  Administered 2023-04-12: 300 ug via SUBCUTANEOUS
  Filled 2023-04-12: qty 0.5

## 2023-04-12 MED ORDER — HEPARIN SOD (PORK) LOCK FLUSH 100 UNIT/ML IV SOLN
500.0000 [IU] | Freq: Once | INTRAVENOUS | Status: AC
  Administered 2023-04-12: 500 [IU]

## 2023-04-13 ENCOUNTER — Other Ambulatory Visit: Payer: Self-pay

## 2023-04-13 ENCOUNTER — Inpatient Hospital Stay: Payer: Medicare HMO

## 2023-04-13 VITALS — BP 114/62 | HR 76 | Temp 97.4°F | Resp 18

## 2023-04-13 DIAGNOSIS — T451X5A Adverse effect of antineoplastic and immunosuppressive drugs, initial encounter: Secondary | ICD-10-CM | POA: Diagnosis not present

## 2023-04-13 DIAGNOSIS — D701 Agranulocytosis secondary to cancer chemotherapy: Secondary | ICD-10-CM | POA: Diagnosis not present

## 2023-04-13 DIAGNOSIS — E279 Disorder of adrenal gland, unspecified: Secondary | ICD-10-CM | POA: Diagnosis not present

## 2023-04-13 DIAGNOSIS — Z5112 Encounter for antineoplastic immunotherapy: Secondary | ICD-10-CM | POA: Diagnosis not present

## 2023-04-13 DIAGNOSIS — Z5111 Encounter for antineoplastic chemotherapy: Secondary | ICD-10-CM | POA: Diagnosis not present

## 2023-04-13 DIAGNOSIS — D649 Anemia, unspecified: Secondary | ICD-10-CM | POA: Diagnosis not present

## 2023-04-13 DIAGNOSIS — C3412 Malignant neoplasm of upper lobe, left bronchus or lung: Secondary | ICD-10-CM | POA: Diagnosis not present

## 2023-04-13 MED ORDER — FILGRASTIM-SNDZ 300 MCG/0.5ML IJ SOSY
300.0000 ug | PREFILLED_SYRINGE | Freq: Once | INTRAMUSCULAR | Status: AC
  Administered 2023-04-13: 300 ug via SUBCUTANEOUS
  Filled 2023-04-13: qty 0.5

## 2023-04-13 NOTE — Progress Notes (Signed)
Patient presents today for Zarxio injection per providers order.  Vital signs WNL.  Patient has no new complaints at this time.  Stable during administration without incident; injection site WNL; see MAR for injection details.  Patient tolerated procedure well and without incident.  No questions or complaints noted at this time.

## 2023-04-13 NOTE — Patient Instructions (Signed)
[facility] - A DEPT OF [facility]  Discharge Instructions: Thank you for choosing [facility] to provide your oncology and hematology care.  If you have a lab appointment with the Cancer Center - please note that after April 8th, 2024, all labs will be drawn in the cancer center.  You do not have to check in or register with the main entrance as you have in the past but will complete your check-in in the cancer center.  Wear comfortable clothing and clothing appropriate for easy access to any Portacath or PICC line.   We strive to give you quality time with your provider. You may need to reschedule your appointment if you arrive late (15 or more minutes).  Arriving late affects you and other patients whose appointments are after yours.  Also, if you miss three or more appointments without notifying the office, you may be dismissed from the clinic at the provider's discretion.      For prescription refill requests, have your pharmacy contact our office and allow 72 hours for refills to be completed.    Today you received the following chemotherapy and/or immunotherapy agents Zarxio      To help prevent nausea and vomiting after your treatment, we encourage you to take your nausea medication as directed.  BELOW ARE SYMPTOMS THAT SHOULD BE REPORTED IMMEDIATELY: *FEVER GREATER THAN 100.4 F (38 C) OR HIGHER *CHILLS OR SWEATING *NAUSEA AND VOMITING THAT IS NOT CONTROLLED WITH YOUR NAUSEA MEDICATION *UNUSUAL SHORTNESS OF BREATH *UNUSUAL BRUISING OR BLEEDING *URINARY PROBLEMS (pain or burning when urinating, or frequent urination) *BOWEL PROBLEMS (unusual diarrhea, constipation, pain near the anus) TENDERNESS IN MOUTH AND THROAT WITH OR WITHOUT PRESENCE OF ULCERS (sore throat, sores in mouth, or a toothache) UNUSUAL RASH, SWELLING OR PAIN  UNUSUAL VAGINAL DISCHARGE OR ITCHING   Items with * indicate a potential emergency and should be followed up  as soon as possible or go to the Emergency Department if any problems should occur.  Please show the CHEMOTHERAPY ALERT CARD or IMMUNOTHERAPY ALERT CARD at check-in to the Emergency Department and triage nurse.  Should you have questions after your visit or need to cancel or reschedule your appointment, please contact [facility] - A DEPT OF [facility] [phone]  and follow the prompts.  Office hours are 8:00 a.m. to 4:30 p.m. Monday - Friday. Please note that voicemails left after 4:00 p.m. may not be returned until the following business day.  We are closed weekends and major holidays. You have access to a nurse at all times for urgent questions. Please call the main number to the clinic [phone] and follow the prompts.  For any non-urgent questions, you may also contact your provider using MyChart. We now offer e-Visits for anyone 30 and older to request care online for non-urgent symptoms. For details visit [email or web address].   Also download the MyChart app! Go to the app store, search "MyChart", open the app, select [place], and log in with your MyChart username and password.

## 2023-04-14 ENCOUNTER — Inpatient Hospital Stay: Payer: Medicare HMO

## 2023-04-14 ENCOUNTER — Other Ambulatory Visit: Payer: Self-pay

## 2023-04-14 VITALS — BP 117/56 | HR 77 | Temp 98.1°F | Resp 18

## 2023-04-14 DIAGNOSIS — D649 Anemia, unspecified: Secondary | ICD-10-CM | POA: Diagnosis not present

## 2023-04-14 DIAGNOSIS — C3412 Malignant neoplasm of upper lobe, left bronchus or lung: Secondary | ICD-10-CM | POA: Diagnosis not present

## 2023-04-14 DIAGNOSIS — T451X5A Adverse effect of antineoplastic and immunosuppressive drugs, initial encounter: Secondary | ICD-10-CM

## 2023-04-14 DIAGNOSIS — D701 Agranulocytosis secondary to cancer chemotherapy: Secondary | ICD-10-CM | POA: Diagnosis not present

## 2023-04-14 DIAGNOSIS — E279 Disorder of adrenal gland, unspecified: Secondary | ICD-10-CM | POA: Diagnosis not present

## 2023-04-14 DIAGNOSIS — Z5111 Encounter for antineoplastic chemotherapy: Secondary | ICD-10-CM | POA: Diagnosis not present

## 2023-04-14 DIAGNOSIS — Z5112 Encounter for antineoplastic immunotherapy: Secondary | ICD-10-CM | POA: Diagnosis not present

## 2023-04-14 MED ORDER — FILGRASTIM-SNDZ 300 MCG/0.5ML IJ SOSY
300.0000 ug | PREFILLED_SYRINGE | Freq: Once | INTRAMUSCULAR | Status: AC
  Administered 2023-04-14: 300 ug via SUBCUTANEOUS
  Filled 2023-04-14: qty 0.5

## 2023-04-14 NOTE — Progress Notes (Signed)
Patient tolerated injection with no complaints voiced.  Site clean and dry with no bruising or swelling noted at site.  See MAR for details.  Band aid applied.  Patient stable during and after injection.  Vss with discharge and left in satisfactory condition with no s/s of distress noted. All follow ups as scheduled.   Isha Seefeld [employer]

## 2023-04-14 NOTE — Patient Instructions (Signed)
Filgrastim Injection What is this medication? FILGRASTIM (fil GRA stim) lowers the risk of infection in people who are receiving chemotherapy. It works by helping your body make more white blood cells, which protects your body from infection. It may also be used to help people who have been exposed to high doses of radiation. It can be used to help prepare your body before a stem cell transplant. It works by helping your bone marrow make and release stem cells into the blood. This medicine may be used for other purposes; ask your health care provider or pharmacist if you have questions. COMMON BRAND NAME(S): Neupogen, Nivestym, Releuko, Zarxio What should I tell my care team before I take this medication? They need to know if you have any of these conditions: History of blood diseases, such as sickle cell anemia Kidney disease Recent or ongoing radiation An unusual or allergic reaction to filgrastim, pegfilgrastim, latex, rubber, other medications, foods, dyes, or preservatives Pregnant or trying to get pregnant Breast-feeding How should I use this medication? This medication is injected under the skin or into a vein. It is usually given by your care team in a hospital or clinic setting. It may be given at home. If you get this medication at home, you will be taught how to prepare and give it. Use exactly as directed. Take it as directed on the prescription label at the same time every day. Keep taking it unless your care team tells you to stop. It is important that you put your used needles and syringes in a special sharps container. Do not put them in a trash can. If you do not have a sharps container, call your pharmacist or care team to get one. This medication comes with INSTRUCTIONS FOR USE. Ask your pharmacist for directions on how to use this medication. Read the information carefully. Talk to your pharmacist or care team if you have questions. Talk to your care team about the use of this  medication in children. While it may be prescribed for children for selected conditions, precautions do apply. Overdosage: If you think you have taken too much of this medicine contact a poison control center or emergency room at once. NOTE: This medicine is only for you. Do not share this medicine with others. What if I miss a dose? It is important not to miss any doses. Talk to your care team about what to do if you miss a dose. What may interact with this medication? Medications that may cause a release of neutrophils, such as lithium This list may not describe all possible interactions. Give your health care provider a list of all the medicines, herbs, non-prescription drugs, or dietary supplements you use. Also tell them if you smoke, drink alcohol, or use illegal drugs. Some items may interact with your medicine. What should I watch for while using this medication? Your condition will be monitored carefully while you are receiving this medication. You may need bloodwork while taking this medication. Talk to your care team about your risk of cancer. You may be more at risk for certain types of cancer if you take this medication. What side effects may I notice from receiving this medication? Side effects that you should report to your care team as soon as possible: Allergic reactions--skin rash, itching, hives, swelling of the face, lips, tongue, or throat Capillary leak syndrome--stomach or muscle pain, unusual weakness or fatigue, feeling faint or lightheaded, decrease in the amount of urine, swelling of the ankles, hands, or   feet, trouble breathing High white blood cell level--fever, fatigue, trouble breathing, night sweats, change in vision, weight loss Inflammation of the aorta--fever, fatigue, back, chest, or stomach pain, severe headache Kidney injury (glomerulonephritis)--decrease in the amount of urine, red or dark brown urine, foamy or bubbly urine, swelling of the ankles, hands, or  feet Shortness of breath or trouble breathing Spleen injury--pain in upper left stomach or shoulder Unusual bruising or bleeding Side effects that usually do not require medical attention (report to your care team if they continue or are bothersome): Back pain Bone pain Fatigue Fever Headache Nausea This list may not describe all possible side effects. Call your doctor for medical advice about side effects. You may report side effects to FDA at 1-800-FDA-1088. Where should I keep my medication? Keep out of the reach of children and pets. Keep this medication in the original packaging until you are ready to take it. Protect from light. See product for storage information. Each product may have different instructions. Get rid of any unused medication after the expiration date. To get rid of medications that are no longer needed or have expired: Take the medication to a medications take-back program. Check with your pharmacy or law enforcement to find a location. If you cannot return the medication, ask your pharmacist or care team how to get rid of this medication safely. NOTE: This sheet is a summary. It may not cover all possible information. If you have questions about this medicine, talk to your doctor, pharmacist, or health care provider.  2024 Elsevier/Gold Standard (2021-10-14 00:00:00)  

## 2023-04-19 ENCOUNTER — Encounter: Payer: Self-pay | Admitting: Internal Medicine

## 2023-04-19 ENCOUNTER — Other Ambulatory Visit: Payer: Self-pay

## 2023-04-19 ENCOUNTER — Inpatient Hospital Stay: Payer: Medicare HMO

## 2023-04-19 DIAGNOSIS — C3412 Malignant neoplasm of upper lobe, left bronchus or lung: Secondary | ICD-10-CM

## 2023-04-19 DIAGNOSIS — D649 Anemia, unspecified: Secondary | ICD-10-CM | POA: Diagnosis not present

## 2023-04-19 DIAGNOSIS — Z5111 Encounter for antineoplastic chemotherapy: Secondary | ICD-10-CM | POA: Diagnosis not present

## 2023-04-19 DIAGNOSIS — E279 Disorder of adrenal gland, unspecified: Secondary | ICD-10-CM | POA: Diagnosis not present

## 2023-04-19 DIAGNOSIS — Z95828 Presence of other vascular implants and grafts: Secondary | ICD-10-CM

## 2023-04-19 DIAGNOSIS — Z5112 Encounter for antineoplastic immunotherapy: Secondary | ICD-10-CM | POA: Diagnosis not present

## 2023-04-19 DIAGNOSIS — T451X5A Adverse effect of antineoplastic and immunosuppressive drugs, initial encounter: Secondary | ICD-10-CM | POA: Diagnosis not present

## 2023-04-19 DIAGNOSIS — D701 Agranulocytosis secondary to cancer chemotherapy: Secondary | ICD-10-CM | POA: Diagnosis not present

## 2023-04-19 LAB — CMP (CANCER CENTER ONLY)
ALT: 36 U/L (ref 0–44)
AST: 34 U/L (ref 15–41)
Albumin: 3.1 g/dL — ABNORMAL LOW (ref 3.5–5.0)
Alkaline Phosphatase: 271 U/L — ABNORMAL HIGH (ref 38–126)
Anion gap: 4 — ABNORMAL LOW (ref 5–15)
BUN: 17 mg/dL (ref 8–23)
CO2: 30 mmol/L (ref 22–32)
Calcium: 9.3 mg/dL (ref 8.9–10.3)
Chloride: 103 mmol/L (ref 98–111)
Creatinine: 0.87 mg/dL (ref 0.44–1.00)
GFR, Estimated: >60 mL/min (ref >60)
Glucose, Bld: 115 mg/dL — ABNORMAL HIGH (ref 70–99)
Potassium: 3.7 mmol/L (ref 3.5–5.1)
Sodium: 137 mmol/L (ref 135–145)
Total Bilirubin: 0.3 mg/dL (ref <1.2)
Total Protein: 7.6 g/dL (ref 6.5–8.1)

## 2023-04-19 LAB — CBC WITH DIFFERENTIAL (CANCER CENTER ONLY)
Abs Immature Granulocytes: 0.21 10*3/uL — ABNORMAL HIGH (ref 0.00–0.07)
Basophils Absolute: 0 10*3/uL (ref 0.0–0.1)
Basophils Relative: 0 %
Eosinophils Absolute: 0 10*3/uL (ref 0.0–0.5)
Eosinophils Relative: 1 %
HCT: 27.8 % — ABNORMAL LOW (ref 36.0–46.0)
Hemoglobin: 8.8 g/dL — ABNORMAL LOW (ref 12.0–15.0)
Immature Granulocytes: 5 %
Lymphocytes Relative: 24 %
Lymphs Abs: 1 10*3/uL (ref 0.7–4.0)
MCH: 27.7 pg (ref 26.0–34.0)
MCHC: 31.7 g/dL (ref 30.0–36.0)
MCV: 87.4 fL (ref 80.0–100.0)
Monocytes Absolute: 0.8 10*3/uL (ref 0.1–1.0)
Monocytes Relative: 21 %
Neutro Abs: 1.9 10*3/uL (ref 1.7–7.7)
Neutrophils Relative %: 49 %
Platelet Count: 99 10*3/uL — ABNORMAL LOW (ref 150–400)
RBC: 3.18 MIL/uL — ABNORMAL LOW (ref 3.87–5.11)
RDW: 19.6 % — ABNORMAL HIGH (ref 11.5–15.5)
WBC Count: 4 10*3/uL (ref 4.0–10.5)
nRBC: 1 % — ABNORMAL HIGH (ref 0.0–0.2)

## 2023-04-19 LAB — SAMPLE TO BLOOD BANK

## 2023-04-19 MED ORDER — SODIUM CHLORIDE 0.9% FLUSH
10.0000 mL | Freq: Once | INTRAVENOUS | Status: DC
Start: 2023-04-19 — End: 2023-04-19

## 2023-04-19 MED ORDER — HEPARIN SOD (PORK) LOCK FLUSH 100 UNIT/ML IV SOLN
500.0000 [IU] | Freq: Once | INTRAVENOUS | Status: DC
Start: 2023-04-19 — End: 2023-04-19

## 2023-04-20 ENCOUNTER — Ambulatory Visit: Payer: Medicare HMO | Admitting: Cardiology

## 2023-04-20 ENCOUNTER — Other Ambulatory Visit: Payer: Self-pay

## 2023-04-25 MED FILL — Fosaprepitant Dimeglumine For IV Infusion 150 MG (Base Eq): INTRAVENOUS | Qty: 5 | Status: AC

## 2023-04-26 ENCOUNTER — Other Ambulatory Visit: Payer: Self-pay | Admitting: Medical Oncology

## 2023-04-26 ENCOUNTER — Inpatient Hospital Stay: Payer: Medicare HMO

## 2023-04-26 ENCOUNTER — Inpatient Hospital Stay (HOSPITAL_BASED_OUTPATIENT_CLINIC_OR_DEPARTMENT_OTHER): Payer: Medicare HMO | Admitting: Internal Medicine

## 2023-04-26 VITALS — BP 99/61 | HR 65

## 2023-04-26 VITALS — BP 89/62 | HR 78 | Temp 97.5°F | Resp 15 | Ht 63.0 in | Wt 117.3 lb

## 2023-04-26 DIAGNOSIS — E279 Disorder of adrenal gland, unspecified: Secondary | ICD-10-CM | POA: Diagnosis not present

## 2023-04-26 DIAGNOSIS — R918 Other nonspecific abnormal finding of lung field: Secondary | ICD-10-CM

## 2023-04-26 DIAGNOSIS — C3412 Malignant neoplasm of upper lobe, left bronchus or lung: Secondary | ICD-10-CM | POA: Diagnosis not present

## 2023-04-26 DIAGNOSIS — D649 Anemia, unspecified: Secondary | ICD-10-CM | POA: Diagnosis not present

## 2023-04-26 DIAGNOSIS — C3492 Malignant neoplasm of unspecified part of left bronchus or lung: Secondary | ICD-10-CM

## 2023-04-26 DIAGNOSIS — T451X5A Adverse effect of antineoplastic and immunosuppressive drugs, initial encounter: Secondary | ICD-10-CM | POA: Diagnosis not present

## 2023-04-26 DIAGNOSIS — Z5112 Encounter for antineoplastic immunotherapy: Secondary | ICD-10-CM | POA: Diagnosis not present

## 2023-04-26 DIAGNOSIS — Z5111 Encounter for antineoplastic chemotherapy: Secondary | ICD-10-CM | POA: Diagnosis not present

## 2023-04-26 DIAGNOSIS — Z95828 Presence of other vascular implants and grafts: Secondary | ICD-10-CM

## 2023-04-26 DIAGNOSIS — D701 Agranulocytosis secondary to cancer chemotherapy: Secondary | ICD-10-CM | POA: Diagnosis not present

## 2023-04-26 LAB — CBC WITH DIFFERENTIAL (CANCER CENTER ONLY)
Abs Immature Granulocytes: 0.02 10*3/uL (ref 0.00–0.07)
Basophils Absolute: 0.1 10*3/uL (ref 0.0–0.1)
Basophils Relative: 2 %
Eosinophils Absolute: 0.1 10*3/uL (ref 0.0–0.5)
Eosinophils Relative: 2 %
HCT: 29.4 % — ABNORMAL LOW (ref 36.0–46.0)
Hemoglobin: 9.7 g/dL — ABNORMAL LOW (ref 12.0–15.0)
Immature Granulocytes: 0 %
Lymphocytes Relative: 21 %
Lymphs Abs: 0.9 10*3/uL (ref 0.7–4.0)
MCH: 29 pg (ref 26.0–34.0)
MCHC: 33 g/dL (ref 30.0–36.0)
MCV: 87.8 fL (ref 80.0–100.0)
Monocytes Absolute: 0.8 10*3/uL (ref 0.1–1.0)
Monocytes Relative: 17 %
Neutro Abs: 2.6 10*3/uL (ref 1.7–7.7)
Neutrophils Relative %: 58 %
Platelet Count: 372 10*3/uL (ref 150–400)
RBC: 3.35 MIL/uL — ABNORMAL LOW (ref 3.87–5.11)
RDW: 20.9 % — ABNORMAL HIGH (ref 11.5–15.5)
WBC Count: 4.5 10*3/uL (ref 4.0–10.5)
nRBC: 0 % (ref 0.0–0.2)

## 2023-04-26 LAB — CMP (CANCER CENTER ONLY)
ALT: 30 U/L (ref 0–44)
AST: 36 U/L (ref 15–41)
Albumin: 3.2 g/dL — ABNORMAL LOW (ref 3.5–5.0)
Alkaline Phosphatase: 268 U/L — ABNORMAL HIGH (ref 38–126)
Anion gap: 5 (ref 5–15)
BUN: 16 mg/dL (ref 8–23)
CO2: 30 mmol/L (ref 22–32)
Calcium: 9.6 mg/dL (ref 8.9–10.3)
Chloride: 103 mmol/L (ref 98–111)
Creatinine: 0.86 mg/dL (ref 0.44–1.00)
GFR, Estimated: >60 mL/min (ref >60)
Glucose, Bld: 124 mg/dL — ABNORMAL HIGH (ref 70–99)
Potassium: 3.5 mmol/L (ref 3.5–5.1)
Sodium: 138 mmol/L (ref 135–145)
Total Bilirubin: 0.4 mg/dL (ref <1.2)
Total Protein: 8.1 g/dL (ref 6.5–8.1)

## 2023-04-26 LAB — TSH: TSH: 1.088 u[IU]/mL (ref 0.350–4.500)

## 2023-04-26 MED ORDER — SODIUM CHLORIDE 0.9 % IV SOLN
500.0000 mg/m2 | Freq: Once | INTRAVENOUS | Status: AC
  Administered 2023-04-26: 800 mg via INTRAVENOUS
  Filled 2023-04-26: qty 20

## 2023-04-26 MED ORDER — SODIUM CHLORIDE 0.9 % IV SOLN
347.5000 mg | Freq: Once | INTRAVENOUS | Status: AC
  Administered 2023-04-26: 350 mg via INTRAVENOUS
  Filled 2023-04-26: qty 35

## 2023-04-26 MED ORDER — HEPARIN SOD (PORK) LOCK FLUSH 100 UNIT/ML IV SOLN
500.0000 [IU] | Freq: Once | INTRAVENOUS | Status: AC | PRN
  Administered 2023-04-26: 500 [IU]

## 2023-04-26 MED ORDER — SODIUM CHLORIDE 0.9 % IV SOLN: Freq: Once | INTRAVENOUS | Status: AC

## 2023-04-26 MED ORDER — SODIUM CHLORIDE 0.9 % IV SOLN
200.0000 mg | Freq: Once | INTRAVENOUS | Status: AC
  Administered 2023-04-26: 200 mg via INTRAVENOUS
  Filled 2023-04-26: qty 200

## 2023-04-26 MED ORDER — PALONOSETRON HCL INJECTION 0.25 MG/5ML
0.2500 mg | Freq: Once | INTRAVENOUS | Status: AC
  Administered 2023-04-26: 0.25 mg via INTRAVENOUS
  Filled 2023-04-26: qty 5

## 2023-04-26 MED ORDER — SODIUM CHLORIDE 0.9% FLUSH
10.0000 mL | INTRAVENOUS | Status: DC | PRN
  Administered 2023-04-26: 10 mL

## 2023-04-26 MED ORDER — DEXAMETHASONE SODIUM PHOSPHATE 10 MG/ML IJ SOLN
10.0000 mg | Freq: Once | INTRAMUSCULAR | Status: AC
Start: 2023-04-26 — End: 2023-04-26
  Administered 2023-04-26: 10 mg via INTRAVENOUS
  Filled 2023-04-26: qty 1

## 2023-04-26 MED ORDER — SODIUM CHLORIDE 0.9% FLUSH
10.0000 mL | Freq: Once | INTRAVENOUS | Status: AC
  Administered 2023-04-26: 10 mL

## 2023-04-26 MED ORDER — SODIUM CHLORIDE 0.9 % IV SOLN
150.0000 mg | Freq: Once | INTRAVENOUS | Status: AC
  Administered 2023-04-26: 150 mg via INTRAVENOUS
  Filled 2023-04-26: qty 150

## 2023-04-26 NOTE — Progress Notes (Signed)
[facility] Telephone:([phone]   [phone]  OFFICE PROGRESS NOTE  Erin Canary, [name] [address] [place] [address]  DIAGNOSIS:  Stage IV (T1c, N0, M1b) Non-Small Cell Lung Cancer, adenocarcinoma. She presented with a spiculated left upper lobe lung nodule and large right adrenal gland mass. She was diagnosed in July 2024. She had molecular studies that showed she has KRASG12C which can be used in the second line setting.    PDL1: 1%   PRIOR THERAPY: SBRT to the left upper lobe and right adrenal targets under the care of Dr. Mitzi Hansen.  Last dose of treatment expected on 02/20/2023   CURRENT THERAPY: Systemic chemotherapy with carboplatin for AUC of 5, Alimta 500 Mg/M2 and Keytruda 200 Mg IV every 3 weeks.  First dose April 05, 2023.  INTERVAL HISTORY: Erin Pacheco 72 y.o. female returns to the clinic today for follow-up visit accompanied by her daughter.  Discussed the use of AI scribe software for clinical note transcription with the patient, who gave verbal consent to proceed.  History of Present Illness   The patient, a 72 year old diagnosed with stage four non-small cell lung cancer (adenocarcinoma) with KRAS G12C mutation and PD-L1 of one percent, started chemotherapy in October. The regimen includes carboplatin, Alimta, and Keytruda. The first round was well-tolerated with no reported nausea, vomiting, or diarrhea. Recently, she experienced bone aches but overall, she remains active at home.  Her respiratory status is stable with no reported breathing difficulties. Her weight fluctuates slightly but overall remains stable. She maintains a good appetite.  The patient's blood pressure was noted to be on the lower side, but this was attributed to not eating prior to the appointment. She consumed a sandwich and yogurt in an attempt to improve this. She is also cautious about limiting visitors to prevent potential infections.       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  MEDICATIONS:  Current Outpatient Medications  Medication Sig Dispense Refill   acetaminophen (TYLENOL) 325 MG tablet Take 2 tablets (650 mg total) by mouth every 6 (six) hours as needed for mild pain (or Fever >/= 101).     apixaban (ELIQUIS) 5 MG TABS tablet Take 1 tablet (5 mg total) by mouth 2 (two) times daily. 60 tablet 5   Fluticasone-Umeclidin-Vilant (TRELEGY ELLIPTA) 100-62.5-25 MCG/ACT AEPB Inhale 1 each into the lungs daily.     folic acid (FOLVITE) 1 MG tablet Take 1 tablet (1 mg total) by mouth daily. Start 7 days before pemetrexed chemotherapy. Continue until 21 days after pemetrexed completed. 100 tablet 3   lidocaine-prilocaine (EMLA) cream Apply to the Port-A-Cath site 30 minutes before treatment. 30 g 0   metoprolol succinate (TOPROL-XL) 50 MG 24 hr tablet TAKE 1 TABLET BY MOUTH DAILY. TAKE WITH OR IMMEDIATELY FOLLOWING A MEAL. 60 tablet 5   Multiple Vitamin (MULTIVITAMIN) tablet Take 1 tablet by mouth daily.       ondansetron (ZOFRAN) 8 MG tablet Take 1 tablet (8 mg total) by mouth every 8 (eight) hours as needed for nausea or vomiting. 30 tablet 0   oxyCODONE (OXY IR/ROXICODONE) 5 MG immediate  release tablet Take 1 tablet (5 mg total) by mouth every 6 (six) hours as needed for moderate pain. 60 tablet 0   pantoprazole (PROTONIX) 40 MG tablet Take 1 tablet (40 mg total) by mouth daily before breakfast. 30 tablet 3   Polyethyl Glycol-Propyl Glycol (SYSTANE OP) Place 1 drop into both eyes daily as needed (dry eye).     prochlorperazine (COMPAZINE) 10 MG tablet Take 1 tablet (10 mg total) by mouth every 6 (six) hours as needed for nausea or vomiting. 30 tablet 1    sacubitril-valsartan (ENTRESTO) 49-51 MG Take 1 tablet by mouth 2 (two) times daily. 60 tablet 5   spironolactone (ALDACTONE) 25 MG tablet Take 0.5 tablets (12.5 mg total) by mouth daily. 30 tablet 1   No current facility-administered medications for this visit.    SURGICAL HISTORY:  Past Surgical History:  Procedure Laterality Date   ABDOMINAL HYSTERECTOMY     BILATERAL OOPHORECTOMY     BIOPSY  12/09/2022   Procedure: BIOPSY;  Surgeon: Imogene Burn, MD;  Location: [facility] ENDOSCOPY;  Service: Gastroenterology;;   BRONCHIAL BIOPSY  01/09/2023   Procedure: BRONCHIAL BIOPSIES;  Surgeon: Josephine Igo, DO;  Location: [facility] ENDOSCOPY;  Service: Pulmonary;;   BRONCHIAL NEEDLE ASPIRATION BIOPSY  01/09/2023   Procedure: BRONCHIAL NEEDLE ASPIRATION BIOPSIES;  Surgeon: Josephine Igo, DO;  Location: [facility] ENDOSCOPY;  Service: Pulmonary;;   CATARACT EXTRACTION W/PHACO  12/27/2010   Procedure: CATARACT EXTRACTION PHACO AND INTRAOCULAR LENS PLACEMENT (IOC);  Surgeon: Gemma Payor;  Location: [facility] ORS;  Service: Ophthalmology;  Laterality: Right;   CATARACT EXTRACTION W/PHACO  03/28/2011   Procedure: CATARACT EXTRACTION PHACO AND INTRAOCULAR LENS PLACEMENT (IOC);  Surgeon: Gemma Payor;  Location: [facility] ORS;  Service: Ophthalmology;  Laterality: Left;  CDE 7.27   ENTEROSCOPY N/A 02/28/2023   Procedure: ENTEROSCOPY;  Surgeon: Iva Boop, MD;  Location: [facility] ENDOSCOPY;  Service: Gastroenterology;  Laterality: N/A;   ESOPHAGOGASTRODUODENOSCOPY (EGD) WITH PROPOFOL N/A 12/09/2022   Procedure: ESOPHAGOGASTRODUODENOSCOPY (EGD) WITH PROPOFOL;  Surgeon: Imogene Burn, MD;  Location: [facility] ENDOSCOPY;  Service: Gastroenterology;  Laterality: N/A;   HOT HEMOSTASIS N/A 02/28/2023   Procedure: HOT HEMOSTASIS (ARGON PLASMA COAGULATION/BICAP);  Surgeon: Iva Boop, MD;  Location: [facility] ENDOSCOPY;  Service: Gastroenterology;  Laterality: N/A;   IR IMAGING GUIDED PORT INSERTION  03/23/2023   SUBMUCOSAL TATTOO INJECTION  02/28/2023    Procedure: SUBMUCOSAL TATTOO INJECTION;  Surgeon: Iva Boop, MD;  Location: [facility] ENDOSCOPY;  Service: Gastroenterology;;    REVIEW OF SYSTEMS:  A comprehensive review of systems was negative except for: Constitutional: positive for fatigue Respiratory: positive for dyspnea on exertion   PHYSICAL EXAMINATION: General appearance: alert, cooperative, fatigued, and no distress Head: Normocephalic, without obvious abnormality, atraumatic Neck: no adenopathy, no JVD, supple, symmetrical, trachea midline, and thyroid not enlarged, symmetric, no tenderness/mass/nodules Lymph nodes: Cervical, supraclavicular, and axillary nodes normal. Resp: clear to auscultation bilaterally Back: symmetric, no curvature. ROM normal. No CVA tenderness. Cardio: regular rate and rhythm, S1, S2 normal, no murmur, click, rub or gallop GI: soft, non-tender; bowel sounds normal; no masses,  no organomegaly Extremities: extremities normal, atraumatic, no cyanosis or edema  ECOG PERFORMANCE STATUS: 1 - Symptomatic but completely ambulatory  Blood pressure (!) 89/62, pulse 78, temperature (!) 97.5 F (36.4 C), temperature source Temporal, resp. rate 15, height 5\' 3"  (1.6 m), weight 117 lb 4.8 oz (53.2 kg), SpO2 100%.  LABORATORY DATA: Lab Results  Component Value Date   WBC 4.5 04/26/2023  HGB 9.7 (L) 04/26/2023   HCT 29.4 (L) 04/26/2023   MCV 87.8 04/26/2023   PLT 372 04/26/2023      Chemistry      Component Value Date/Time   NA 137 04/19/2023 1436   K 3.7 04/19/2023 1436   CL 103 04/19/2023 1436   CO2 30 04/19/2023 1436   BUN 17 04/19/2023 1436   CREATININE 0.87 04/19/2023 1436      Component Value Date/Time   CALCIUM 9.3 04/19/2023 1436   ALKPHOS 271 (H) 04/19/2023 1436   AST 34 04/19/2023 1436   ALT 36 04/19/2023 1436   BILITOT 0.3 04/19/2023 1436       RADIOGRAPHIC STUDIES: No results found.  ASSESSMENT AND PLAN: This is a very pleasant 72 years old African-American female recently  diagnosed with a stage IV (T1c, N0, M1b) non-small cell lung cancer, adenocarcinoma with positive KRAS G12C mutation and PD-L1 expression of 1%.  She started systemic chemotherapy with carboplatin for AUC of 5, Alimta 500 Mg/M2 and Keytruda 200 Mg IV every 3 weeks.  She is status post 1 cycle of her treatment.    Stage IV Non-Small Cell Lung Cancer (NSCLC) with KRAS G12C mutation and PD-L1 of 1% Stage IV NSCLC, adenocarcinoma subtype, diagnosed in October 2024. KRAS G12C mutation and PD-L1 expression of 1%. Currently undergoing chemotherapy with carboplatin, Alimta, and Keytruda. Completed one cycle with minimal side effects, primarily bone aches. No significant nausea, vomiting, or diarrhea. Hemoglobin level is 9.7, does not require transfusion. Remains active with stable weight. Blood work is reasonable for treatment today. - Proceed with second cycle of chemotherapy today - Monitor for side effects, particularly bone aches - Follow-up in three weeks  Hypotension Blood pressure recorded at 89/62, likely due to not eating prior to the appointment. Ate a sandwich and yogurt, which should help stabilize blood pressure. - Recheck blood pressure after eating  General Health Maintenance Advised to limit visitors to reduce infection risk, especially during the holiday season. - Limit visitors to reduce infection risk.     The patient voices understanding of current disease status and treatment options and is in agreement with the current care plan.  All questions were answered. The patient knows to call the clinic with any problems, questions or concerns. We can certainly see the patient much sooner if necessary. The total time spent in the appointment was 20 minutes.  Disclaimer: This note was dictated with voice recognition software. Similar sounding words can inadvertently be transcribed and may not be corrected upon review.

## 2023-04-26 NOTE — Patient Instructions (Signed)
 [facility] - A DEPT OF [facility]  Discharge Instructions: Thank you for choosing [facility] to provide your oncology and hematology care.   If you have a lab appointment with the Cancer Center, please go directly to the Cancer Center and check in at the registration area.   Wear comfortable clothing and clothing appropriate for easy access to any Portacath or PICC line.   We strive to give you quality time with your provider. You may need to reschedule your appointment if you arrive late (15 or more minutes).  Arriving late affects you and other patients whose appointments are after yours.  Also, if you miss three or more appointments without notifying the office, you may be dismissed from the clinic at the provider's discretion.      For prescription refill requests, have your pharmacy contact our office and allow 72 hours for refills to be completed.    Today you received the following chemotherapy and/or immunotherapy agents: Keytruda, Alimta, Carboplatin.   To help prevent nausea and vomiting after your treatment, we encourage you to take your nausea medication as directed.  BELOW ARE SYMPTOMS THAT SHOULD BE REPORTED IMMEDIATELY: *FEVER GREATER THAN 100.4 F (38 C) OR HIGHER *CHILLS OR SWEATING *NAUSEA AND VOMITING THAT IS NOT CONTROLLED WITH YOUR NAUSEA MEDICATION *UNUSUAL SHORTNESS OF BREATH *UNUSUAL BRUISING OR BLEEDING *URINARY PROBLEMS (pain or burning when urinating, or frequent urination) *BOWEL PROBLEMS (unusual diarrhea, constipation, pain near the anus) TENDERNESS IN MOUTH AND THROAT WITH OR WITHOUT PRESENCE OF ULCERS (sore throat, sores in mouth, or a toothache) UNUSUAL RASH, SWELLING OR PAIN  UNUSUAL VAGINAL DISCHARGE OR ITCHING   Items with * indicate a potential emergency and should be followed up as soon as possible or go to the Emergency Department if any problems should occur.  Please show the CHEMOTHERAPY ALERT  CARD or IMMUNOTHERAPY ALERT CARD at check-in to the Emergency Department and triage nurse.  Should you have questions after your visit or need to cancel or reschedule your appointment, please contact [facility] - A DEPT OF [facility]  Dept: [phone]  and follow the prompts.  Office hours are 8:00 a.m. to 4:30 p.m. Monday - Friday. Please note that voicemails left after 4:00 p.m. may not be returned until the following business day.  We are closed weekends and major holidays. You have access to a nurse at all times for urgent questions. Please call the main number to the clinic Dept: [phone] and follow the prompts.   For any non-urgent questions, you may also contact your provider using MyChart. We now offer e-Visits for anyone 21 and older to request care online for non-urgent symptoms. For details visit [email or web address].   Also download the MyChart app! Go to the app store, search "MyChart", open the app, select [place], and log in with your MyChart username and password.

## 2023-04-27 ENCOUNTER — Other Ambulatory Visit: Payer: Self-pay

## 2023-04-27 LAB — T4: T4, Total: 7.7 ug/dL (ref 4.5–12.0)

## 2023-05-01 ENCOUNTER — Encounter: Payer: Self-pay | Admitting: Physician Assistant

## 2023-05-01 NOTE — Progress Notes (Signed)
Received call from Erin Pacheco pt's daughter after receiving my card per my request. She was inquiring about applying for financial assistance to assist with medical bills.  Patient has insurance and does not qualify for [employer]. [name] mentioned applying for Medicaid to assist with medical bills. Encouraged to apply. Discussed [employer] for patients whom live in [place] only and advised she may receive their application on Wed 11/27 at her appointment. Advised what is needed to submit application and how and where to submit either at [facility] will be scanned and emailed to me along with supporting documents to be sent to [employer] or in person at the location at the bottom of the application. She verbalized understanding.  She has my card for any additional financial questions or concerns.

## 2023-05-02 ENCOUNTER — Other Ambulatory Visit: Payer: Self-pay

## 2023-05-03 ENCOUNTER — Telehealth: Payer: Self-pay | Admitting: Medical Oncology

## 2023-05-03 ENCOUNTER — Inpatient Hospital Stay: Payer: Medicare HMO

## 2023-05-03 ENCOUNTER — Other Ambulatory Visit: Payer: Medicare HMO

## 2023-05-03 ENCOUNTER — Inpatient Hospital Stay: Payer: Medicare HMO | Admitting: Physician Assistant

## 2023-05-03 ENCOUNTER — Other Ambulatory Visit: Payer: Self-pay

## 2023-05-03 DIAGNOSIS — R918 Other nonspecific abnormal finding of lung field: Secondary | ICD-10-CM

## 2023-05-03 DIAGNOSIS — Z95828 Presence of other vascular implants and grafts: Secondary | ICD-10-CM

## 2023-05-03 DIAGNOSIS — T451X5A Adverse effect of antineoplastic and immunosuppressive drugs, initial encounter: Secondary | ICD-10-CM | POA: Diagnosis not present

## 2023-05-03 DIAGNOSIS — Z5112 Encounter for antineoplastic immunotherapy: Secondary | ICD-10-CM | POA: Diagnosis not present

## 2023-05-03 DIAGNOSIS — E279 Disorder of adrenal gland, unspecified: Secondary | ICD-10-CM | POA: Diagnosis not present

## 2023-05-03 DIAGNOSIS — L309 Dermatitis, unspecified: Secondary | ICD-10-CM

## 2023-05-03 DIAGNOSIS — Z5111 Encounter for antineoplastic chemotherapy: Secondary | ICD-10-CM | POA: Diagnosis not present

## 2023-05-03 DIAGNOSIS — C3412 Malignant neoplasm of upper lobe, left bronchus or lung: Secondary | ICD-10-CM | POA: Diagnosis not present

## 2023-05-03 DIAGNOSIS — D701 Agranulocytosis secondary to cancer chemotherapy: Secondary | ICD-10-CM | POA: Diagnosis not present

## 2023-05-03 DIAGNOSIS — E278 Other specified disorders of adrenal gland: Secondary | ICD-10-CM

## 2023-05-03 DIAGNOSIS — D649 Anemia, unspecified: Secondary | ICD-10-CM | POA: Diagnosis not present

## 2023-05-03 LAB — CBC WITH DIFFERENTIAL (CANCER CENTER ONLY)
Abs Immature Granulocytes: 0 10*3/uL (ref 0.00–0.07)
Basophils Absolute: 0 10*3/uL (ref 0.0–0.1)
Basophils Relative: 1 %
Eosinophils Absolute: 0 10*3/uL (ref 0.0–0.5)
Eosinophils Relative: 2 %
HCT: 27.2 % — ABNORMAL LOW (ref 36.0–46.0)
Hemoglobin: 8.8 g/dL — ABNORMAL LOW (ref 12.0–15.0)
Immature Granulocytes: 0 %
Lymphocytes Relative: 51 %
Lymphs Abs: 0.7 10*3/uL (ref 0.7–4.0)
MCH: 28.9 pg (ref 26.0–34.0)
MCHC: 32.4 g/dL (ref 30.0–36.0)
MCV: 89.2 fL (ref 80.0–100.0)
Monocytes Absolute: 0.1 10*3/uL (ref 0.1–1.0)
Monocytes Relative: 10 %
Neutro Abs: 0.5 10*3/uL — ABNORMAL LOW (ref 1.7–7.7)
Neutrophils Relative %: 36 %
Platelet Count: 280 10*3/uL (ref 150–400)
RBC: 3.05 MIL/uL — ABNORMAL LOW (ref 3.87–5.11)
RDW: 20.5 % — ABNORMAL HIGH (ref 11.5–15.5)
Smear Review: NORMAL
WBC Count: 1.3 10*3/uL — ABNORMAL LOW (ref 4.0–10.5)
nRBC: 0 % (ref 0.0–0.2)

## 2023-05-03 LAB — CMP (CANCER CENTER ONLY)
ALT: 49 U/L — ABNORMAL HIGH (ref 0–44)
AST: 43 U/L — ABNORMAL HIGH (ref 15–41)
Albumin: 3.2 g/dL — ABNORMAL LOW (ref 3.5–5.0)
Alkaline Phosphatase: 241 U/L — ABNORMAL HIGH (ref 38–126)
Anion gap: 3 — ABNORMAL LOW (ref 5–15)
BUN: 25 mg/dL — ABNORMAL HIGH (ref 8–23)
CO2: 32 mmol/L (ref 22–32)
Calcium: 9.2 mg/dL (ref 8.9–10.3)
Chloride: 102 mmol/L (ref 98–111)
Creatinine: 0.84 mg/dL (ref 0.44–1.00)
GFR, Estimated: >60 mL/min (ref >60)
Glucose, Bld: 99 mg/dL (ref 70–99)
Potassium: 4.3 mmol/L (ref 3.5–5.1)
Sodium: 137 mmol/L (ref 135–145)
Total Bilirubin: 0.4 mg/dL (ref <1.2)
Total Protein: 7.7 g/dL (ref 6.5–8.1)

## 2023-05-03 LAB — SAMPLE TO BLOOD BANK

## 2023-05-03 MED ORDER — HEPARIN SOD (PORK) LOCK FLUSH 100 UNIT/ML IV SOLN: 500.0000 [IU] | Freq: Once | INTRAVENOUS | Status: AC

## 2023-05-03 MED ORDER — SODIUM CHLORIDE 0.9% FLUSH: 10.0000 mL | Freq: Once | INTRAVENOUS | Status: AC

## 2023-05-03 NOTE — Telephone Encounter (Addendum)
Rash-Dtr reported - Erin Pacheco said the skin around her port itches and it  started Monday and today there [place] a rash in a ring around the port ". Denies fever, pain , skin not warm to touch .  I told dtr the flush nurse will evaluate it at her appt today.

## 2023-05-03 NOTE — Progress Notes (Signed)
The patient was coming in for port flush appointment today but had some itching and rash over her port site.  The area started itching on 05/01/2023 but she did not look at the area to see if the rash was present at that time.  The port was last accessed last week during her infusion.  Her port was placed on 03/23/2023. There is no swelling, drainage, or warmth.  The shape of the rash appears to be almost a perfect square.  He denies any systemic symptoms.  This appears to be related to some type of allergy whether related to the adhesive/dressing/tape or to the ChloraPrep that was applied over the Port-A-Cath at her last visit.  The patient was advised to use hydrocortisone cream for itching and take Benadryl.  If Benadryl makes her too drowsy then she can take Zyrtec, Allegra, or Claritin.  Of course, as we approach a holiday weekend, we reviewed signs and symptoms that would warrant reevaluation.  Should she develop any systemic symptoms, erythema, swelling, drainage, warmth, etc. she would need to be evaluated promptly. She will have her labs drawn peripherally today.

## 2023-05-06 ENCOUNTER — Other Ambulatory Visit: Payer: Self-pay

## 2023-05-08 ENCOUNTER — Other Ambulatory Visit: Payer: Self-pay | Admitting: Physician Assistant

## 2023-05-08 ENCOUNTER — Inpatient Hospital Stay: Payer: Medicare HMO | Attending: Physician Assistant

## 2023-05-08 ENCOUNTER — Telehealth: Payer: Self-pay | Admitting: Medical Oncology

## 2023-05-08 VITALS — BP 93/62 | HR 72 | Temp 98.4°F | Resp 17

## 2023-05-08 DIAGNOSIS — Z5112 Encounter for antineoplastic immunotherapy: Secondary | ICD-10-CM | POA: Insufficient documentation

## 2023-05-08 DIAGNOSIS — D701 Agranulocytosis secondary to cancer chemotherapy: Secondary | ICD-10-CM | POA: Diagnosis not present

## 2023-05-08 DIAGNOSIS — C3412 Malignant neoplasm of upper lobe, left bronchus or lung: Secondary | ICD-10-CM | POA: Diagnosis not present

## 2023-05-08 DIAGNOSIS — C7971 Secondary malignant neoplasm of right adrenal gland: Secondary | ICD-10-CM | POA: Insufficient documentation

## 2023-05-08 DIAGNOSIS — Z5111 Encounter for antineoplastic chemotherapy: Secondary | ICD-10-CM | POA: Insufficient documentation

## 2023-05-08 DIAGNOSIS — Z720 Tobacco use: Secondary | ICD-10-CM | POA: Insufficient documentation

## 2023-05-08 DIAGNOSIS — T451X5A Adverse effect of antineoplastic and immunosuppressive drugs, initial encounter: Secondary | ICD-10-CM | POA: Diagnosis not present

## 2023-05-08 MED ORDER — FILGRASTIM-SNDZ 300 MCG/0.5ML IJ SOSY
300.0000 ug | PREFILLED_SYRINGE | Freq: Once | INTRAMUSCULAR | Status: AC
Start: 2023-05-08 — End: 2023-05-08
  Administered 2023-05-08: 300 ug via SUBCUTANEOUS
  Filled 2023-05-08: qty 0.5

## 2023-05-08 NOTE — Progress Notes (Signed)
Patient tolerated Zarxio injection with no complaints voiced.  Site clean and dry with no bruising or swelling noted.  No complaints of pain.  Discharged with vital signs stable and no signs or symptoms of distress noted.   

## 2023-05-08 NOTE — Telephone Encounter (Signed)
Dtr requests pt injections be done at [facility]. Inbasket message sent.   Rash-dtr states rash is resolving omn skin over port.

## 2023-05-08 NOTE — Telephone Encounter (Signed)
[facility] will make appt for injections at their facility.

## 2023-05-08 NOTE — Patient Instructions (Signed)
[facility] [place] - A DEPT OF [facility]  Discharge Instructions: Thank you for choosing [facility] to provide your oncology and hematology care.  If you have a lab appointment with the Cancer Center - please note that after April 8th, 2024, all labs will be drawn in the cancer center.  You do not have to check in or register with the main entrance as you have in the past but will complete your check-in in the cancer center.  Wear comfortable clothing and clothing appropriate for easy access to any Portacath or PICC line.   We strive to give you quality time with your provider. You may need to reschedule your appointment if you arrive late (15 or more minutes).  Arriving late affects you and other patients whose appointments are after yours.  Also, if you miss three or more appointments without notifying the office, you may be dismissed from the clinic at the provider's discretion.      For prescription refill requests, have your pharmacy contact our office and allow 72 hours for refills to be completed.    Today you received the following chemotherapy and/or immunotherapy agents Zarxio.  Filgrastim Injection What is this medication? FILGRASTIM (fil GRA stim) lowers the risk of infection in people who are receiving chemotherapy. It works by Systems analyst make more white blood cells, which protects your body from infection. It may also be used to help people who have been exposed to high doses of radiation. It can be used to help prepare your body before a stem cell transplant. It works by helping your bone marrow make and release stem cells into the blood. This medicine may be used for other purposes; ask your health care provider or pharmacist if you have questions. COMMON BRAND NAME(S): Neupogen, Nivestym, Releuko, Zarxio What should I tell my care team before I take this medication? They need to know if you have any of these conditions: History of  blood diseases, such as sickle cell anemia Kidney disease Recent or ongoing radiation An unusual or allergic reaction to filgrastim, pegfilgrastim, latex, rubber, other medications, foods, dyes, or preservatives Pregnant or trying to get pregnant Breast-feeding How should I use this medication? This medication is injected under the skin or into a vein. It is usually given by your care team in a hospital or clinic setting. It may be given at home. If you get this medication at home, you will be taught how to prepare and give it. Use exactly as directed. Take it as directed on the prescription label at the same time every day. Keep taking it unless your care team tells you to stop. It is important that you put your used needles and syringes in a special sharps container. Do not put them in a trash can. If you do not have a sharps container, call your pharmacist or care team to get one. This medication comes with INSTRUCTIONS FOR USE. Ask your pharmacist for directions on how to use this medication. Read the information carefully. Talk to your pharmacist or care team if you have questions. Talk to your care team about the use of this medication in children. While it may be prescribed for children for selected conditions, precautions do apply. Overdosage: If you think you have taken too much of this medicine contact a poison control center or emergency room at once. NOTE: This medicine is only for you. Do not share this medicine with others. What if  I miss a dose? It is important not to miss any doses. Talk to your care team about what to do if you miss a dose. What may interact with this medication? Medications that may cause a release of neutrophils, such as lithium This list may not describe all possible interactions. Give your health care provider a list of all the medicines, herbs, non-prescription drugs, or dietary supplements you use. Also tell them if you smoke, drink alcohol, or use illegal  drugs. Some items may interact with your medicine. What should I watch for while using this medication? Your condition will be monitored carefully while you are receiving this medication. You may need bloodwork while taking this medication. Talk to your care team about your risk of cancer. You may be more at risk for certain types of cancer if you take this medication. What side effects may I notice from receiving this medication? Side effects that you should report to your care team as soon as possible: Allergic reactions--skin rash, itching, hives, swelling of the face, lips, tongue, or throat Capillary leak syndrome--stomach or muscle pain, unusual weakness or fatigue, feeling faint or lightheaded, decrease in the amount of urine, swelling of the ankles, hands, or feet, trouble breathing High white blood cell level--fever, fatigue, trouble breathing, night sweats, change in vision, weight loss Inflammation of the aorta--fever, fatigue, back, chest, or stomach pain, severe headache Kidney injury (glomerulonephritis)--decrease in the amount of urine, red or dark Erin Pacheco urine, foamy or bubbly urine, swelling of the ankles, hands, or feet Shortness of breath or trouble breathing Spleen injury--pain in upper left stomach or shoulder Unusual bruising or bleeding Side effects that usually do not require medical attention (report to your care team if they continue or are bothersome): Back pain Bone pain Fatigue Fever Headache Nausea This list may not describe all possible side effects. Call your doctor for medical advice about side effects. You may report side effects to FDA at 1-800-FDA-1088. Where should I keep my medication? Keep out of the reach of children and pets. Keep this medication in the original packaging until you are ready to take it. Protect from light. See product for storage information. Each product may have different instructions. Get rid of any unused medication after the  expiration date. To get rid of medications that are no longer needed or have expired: Take the medication to a medications take-back program. Check with your pharmacy or law enforcement to find a location. If you cannot return the medication, ask your pharmacist or care team how to get rid of this medication safely. NOTE: This sheet is a summary. It may not cover all possible information. If you have questions about this medicine, talk to your doctor, pharmacist, or health care provider.  2024 Elsevier/Gold Standard (2021-10-14 00:00:00)        To help prevent nausea and vomiting after your treatment, we encourage you to take your nausea medication as directed.  BELOW ARE SYMPTOMS THAT SHOULD BE REPORTED IMMEDIATELY: *FEVER GREATER THAN 100.4 F (38 C) OR HIGHER *CHILLS OR SWEATING *NAUSEA AND VOMITING THAT IS NOT CONTROLLED WITH YOUR NAUSEA MEDICATION *UNUSUAL SHORTNESS OF BREATH *UNUSUAL BRUISING OR BLEEDING *URINARY PROBLEMS (pain or burning when urinating, or frequent urination) *BOWEL PROBLEMS (unusual diarrhea, constipation, pain near the anus) TENDERNESS IN MOUTH AND THROAT WITH OR WITHOUT PRESENCE OF ULCERS (sore throat, sores in mouth, or a toothache) UNUSUAL RASH, SWELLING OR PAIN  UNUSUAL VAGINAL DISCHARGE OR ITCHING   Items with * indicate a potential emergency  and should be followed up as soon as possible or go to the Emergency Department if any problems should occur.  Please show the CHEMOTHERAPY ALERT CARD or IMMUNOTHERAPY ALERT CARD at check-in to the Emergency Department and triage nurse.  Should you have questions after your visit or need to cancel or reschedule your appointment, please contact [facility]ER [facility] [place] - A DEPT OF [facility]igh[facility] [phone]  and follow the prompts.  Office hours are 8:00 a.m. to 4:30 p.m. Monday - Friday. Please note that voicemails left after 4:00 p.m. may not be returned until the following business day.  We are  closed weekends and major holidays. You have access to a nurse at all times for urgent questions. Please call the main number to the clinic [phone] and follow the prompts.  For any non-urgent questions, you may also contact your provider using MyChart. We now offer e-Visits for anyone 7 and older to request care online for non-urgent symptoms. For details visit [email or web address].   Also download the MyChart app! Go to the app store, search "MyChart", open the app, select [place], and log in with your MyChart username and password.

## 2023-05-09 ENCOUNTER — Inpatient Hospital Stay: Payer: Medicare HMO

## 2023-05-09 VITALS — BP 93/58 | HR 82 | Temp 98.0°F | Resp 18

## 2023-05-09 DIAGNOSIS — Z5112 Encounter for antineoplastic immunotherapy: Secondary | ICD-10-CM | POA: Diagnosis not present

## 2023-05-09 DIAGNOSIS — T451X5A Adverse effect of antineoplastic and immunosuppressive drugs, initial encounter: Secondary | ICD-10-CM

## 2023-05-09 MED ORDER — FILGRASTIM-SNDZ 300 MCG/0.5ML IJ SOSY
300.0000 ug | PREFILLED_SYRINGE | Freq: Once | INTRAMUSCULAR | Status: AC
Start: 2023-05-09 — End: 2023-05-09
  Administered 2023-05-09: 300 ug via SUBCUTANEOUS
  Filled 2023-05-09: qty 0.5

## 2023-05-09 NOTE — Progress Notes (Signed)
Patient tolerated injection with no complaints voiced.  Site clean and dry with no bruising or swelling noted at site.  See MAR for details.  Band aid applied.  Patient stable during and after injection.  Vss with discharge and left in satisfactory condition with no s/s of distress noted.  

## 2023-05-09 NOTE — Patient Instructions (Signed)

## 2023-05-10 ENCOUNTER — Other Ambulatory Visit: Payer: Medicare HMO

## 2023-05-10 ENCOUNTER — Inpatient Hospital Stay: Payer: Medicare HMO

## 2023-05-10 DIAGNOSIS — Z95828 Presence of other vascular implants and grafts: Secondary | ICD-10-CM

## 2023-05-10 DIAGNOSIS — Z5112 Encounter for antineoplastic immunotherapy: Secondary | ICD-10-CM | POA: Diagnosis not present

## 2023-05-10 DIAGNOSIS — E278 Other specified disorders of adrenal gland: Secondary | ICD-10-CM

## 2023-05-10 DIAGNOSIS — R918 Other nonspecific abnormal finding of lung field: Secondary | ICD-10-CM

## 2023-05-10 DIAGNOSIS — T451X5A Adverse effect of antineoplastic and immunosuppressive drugs, initial encounter: Secondary | ICD-10-CM

## 2023-05-10 LAB — SAMPLE TO BLOOD BANK

## 2023-05-10 LAB — CBC WITH DIFFERENTIAL (CANCER CENTER ONLY)
Abs Immature Granulocytes: 1.46 10*3/uL — ABNORMAL HIGH (ref 0.00–0.07)
Basophils Absolute: 0.1 10*3/uL (ref 0.0–0.1)
Basophils Relative: 1 %
Eosinophils Absolute: 0 10*3/uL (ref 0.0–0.5)
Eosinophils Relative: 0 %
HCT: 25.6 % — ABNORMAL LOW (ref 36.0–46.0)
Hemoglobin: 8.5 g/dL — ABNORMAL LOW (ref 12.0–15.0)
Immature Granulocytes: 14 %
Lymphocytes Relative: 12 %
Lymphs Abs: 1.3 10*3/uL (ref 0.7–4.0)
MCH: 29.7 pg (ref 26.0–34.0)
MCHC: 33.2 g/dL (ref 30.0–36.0)
MCV: 89.5 fL (ref 80.0–100.0)
Monocytes Absolute: 1.4 10*3/uL — ABNORMAL HIGH (ref 0.1–1.0)
Monocytes Relative: 14 %
Neutro Abs: 6 10*3/uL (ref 1.7–7.7)
Neutrophils Relative %: 59 %
Platelet Count: 107 10*3/uL — ABNORMAL LOW (ref 150–400)
RBC: 2.86 MIL/uL — ABNORMAL LOW (ref 3.87–5.11)
RDW: 20.9 % — ABNORMAL HIGH (ref 11.5–15.5)
Smear Review: NORMAL
WBC Count: 10.3 10*3/uL (ref 4.0–10.5)
nRBC: 0.6 % — ABNORMAL HIGH (ref 0.0–0.2)

## 2023-05-10 LAB — CMP (CANCER CENTER ONLY)
ALT: 27 U/L (ref 0–44)
AST: 26 U/L (ref 15–41)
Albumin: 3.1 g/dL — ABNORMAL LOW (ref 3.5–5.0)
Alkaline Phosphatase: 230 U/L — ABNORMAL HIGH (ref 38–126)
Anion gap: 3 — ABNORMAL LOW (ref 5–15)
BUN: 14 mg/dL (ref 8–23)
CO2: 29 mmol/L (ref 22–32)
Calcium: 9.1 mg/dL (ref 8.9–10.3)
Chloride: 107 mmol/L (ref 98–111)
Creatinine: 0.86 mg/dL (ref 0.44–1.00)
GFR, Estimated: >60 mL/min (ref >60)
Glucose, Bld: 85 mg/dL (ref 70–99)
Potassium: 3.8 mmol/L (ref 3.5–5.1)
Sodium: 139 mmol/L (ref 135–145)
Total Bilirubin: 0.3 mg/dL (ref <1.2)
Total Protein: 7.2 g/dL (ref 6.5–8.1)

## 2023-05-10 MED ORDER — HEPARIN SOD (PORK) LOCK FLUSH 100 UNIT/ML IV SOLN
500.0000 [IU] | Freq: Once | INTRAVENOUS | Status: AC
  Administered 2023-05-10: 500 [IU]

## 2023-05-10 MED ORDER — SODIUM CHLORIDE 0.9% FLUSH
10.0000 mL | Freq: Once | INTRAVENOUS | Status: AC
Start: 2023-05-10 — End: 2023-05-10
  Administered 2023-05-10: 10 mL

## 2023-05-13 NOTE — Progress Notes (Unsigned)
[facility] OFFICE PROGRESS NOTE  Erin Canary, [name] [address] [place] [address]  DIAGNOSIS: Stage IV (T1c, N0, M1b) Non-Small Cell Lung Cancer, adenocarcinoma. She presented with a spiculated left upper lobe lung nodule and large right adrenal gland mass. She was diagnosed in July 2024. She had molecular studies that showed she has KRASG12C which can be used in the second line setting.    PDL1: 1%   Molecular Studies: STK11 but likely not candidate for 4 drug combination and KRASG12C  PRIOR THERAPY: SBRT to the left upper lobe and right adrenal targets under the care of Dr. Mitzi Hansen. Last dose of treatment expected on 02/20/2023   CURRENT THERAPY: 1) Systemic chemotherapy with carboplatin for AUC of 5, Alimta 500 Mg/M2 and Keytruda 200 Mg IV every 3 weeks.  First dose April 05, 2023. Status post 2 cycle.  2) IV iron with Venofer 300 mg weekly PRN. Last dose on 03/21/23.  INTERVAL HISTORY: Erin Pacheco 72 y.o. female returns to the clinic today for a follow up visit accompanied by her daughters. The patient is currently undergoing chemotherapy and immunotherapy. She has been tolerating it well except for neturopenia requiring GCSF infections. She denies any signs or symptoms of infection. She also was seen by myself in the interval due to a rash over her port a cath that looked more allergic in nature which has resolved. We believe she may be allergic to Chloraprep.   Her rash has improved at this time.  She is compliant with iron supplements since she was anemic even prior to being diagnosed with lung cancer and was found to have GI bleeding.   This was treated during endoscopic evaluation.  Of note the patient is on Eliquis for history of blood clot in December 2024.  She was treated with IV iron with venofer.    Overall, she has been tolerating treatment well. She has not had any nausea/vomiting. Her family comments how she actually has been feeling  better and more active at home since starting treatment. She reports her breathing is "fine".  Denies any significant shortness of breath or significant cough.  Denies any fever, chills, or night sweats.  She denies any chest pain or hemoptysis. Denies any diarrhea. She has had mild constipation which is managed with laxatives. Denies any significant headache or visual changes. She had some allergies/runny nose that was managed with Claritin. They had some questions about her treatment plan moving forward. She is here for repeat blood work before undergoing cycle #3.    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  MEDICATIONS:  Current Outpatient Medications  Medication Sig Dispense Refill   acetaminophen (TYLENOL) 325 MG tablet Take 2 tablets (650 mg total) by mouth every 6 (six) hours as needed for mild pain (or Fever >/= 101).     apixaban (ELIQUIS) 5 MG TABS tablet Take 1 tablet (5 mg total) by mouth 2 (two) times daily. 60 tablet 5   Fluticasone-Umeclidin-Vilant (TRELEGY ELLIPTA) 100-62.5-25 MCG/ACT AEPB Inhale 1 each into the lungs daily.     folic acid (FOLVITE) 1 MG tablet Take 1 tablet (1 mg total) by mouth  daily. Start 7 days before pemetrexed chemotherapy. Continue until 21 days after pemetrexed completed. 100 tablet 3   lidocaine-prilocaine (EMLA) cream Apply to the Port-A-Cath site 30 minutes before treatment. 30 g 0   metoprolol succinate (TOPROL-XL) 50 MG 24 hr tablet TAKE 1 TABLET BY MOUTH DAILY. TAKE WITH OR IMMEDIATELY FOLLOWING A MEAL. 60 tablet 5   Multiple Vitamin (MULTIVITAMIN) tablet Take 1 tablet by mouth daily.       ondansetron (ZOFRAN) 8 MG tablet Take 1 tablet (8 mg total) by mouth every 8 (eight)  hours as needed for nausea or vomiting. 30 tablet 0   oxyCODONE (OXY IR/ROXICODONE) 5 MG immediate release tablet Take 1 tablet (5 mg total) by mouth every 6 (six) hours as needed for moderate pain. 60 tablet 0   pantoprazole (PROTONIX) 40 MG tablet Take 1 tablet (40 mg total) by mouth daily before breakfast. 30 tablet 3   Polyethyl Glycol-Propyl Glycol (SYSTANE OP) Place 1 drop into both eyes daily as needed (dry eye).     prochlorperazine (COMPAZINE) 10 MG tablet Take 1 tablet (10 mg total) by mouth every 6 (six) hours as needed for nausea or vomiting. 30 tablet 1   sacubitril-valsartan (ENTRESTO) 49-51 MG Take 1 tablet by mouth 2 (two) times daily. 60 tablet 5   spironolactone (ALDACTONE) 25 MG tablet Take 0.5 tablets (12.5 mg total) by mouth daily. 30 tablet 1   No current facility-administered medications for this visit.   Facility-Administered Medications Ordered in Other Visits  Medication Dose Route Frequency Provider Last Rate Last Admin   heparin lock flush 100 unit/mL  500 Units Intracatheter Once Si Gaul, MD       sodium chloride flush (NS) 0.9 % injection 10 mL  10 mL Intracatheter Once Si Gaul, MD        SURGICAL HISTORY:  Past Surgical History:  Procedure Laterality Date   ABDOMINAL HYSTERECTOMY     BILATERAL OOPHORECTOMY     BIOPSY  12/09/2022   Procedure: BIOPSY;  Surgeon: Imogene Burn, MD;  Location: [facility] ENDOSCOPY;  Service: Gastroenterology;;   BRONCHIAL BIOPSY  01/09/2023   Procedure: BRONCHIAL BIOPSIES;  Surgeon: Josephine Igo, DO;  Location: [facility] ENDOSCOPY;  Service: Pulmonary;;   BRONCHIAL NEEDLE ASPIRATION BIOPSY  01/09/2023   Procedure: BRONCHIAL NEEDLE ASPIRATION BIOPSIES;  Surgeon: Josephine Igo, DO;  Location: [facility] ENDOSCOPY;  Service: Pulmonary;;   CATARACT EXTRACTION W/PHACO  12/27/2010   Procedure: CATARACT EXTRACTION PHACO AND INTRAOCULAR LENS PLACEMENT (IOC);  Surgeon: Gemma Payor;  Location: [facility] ORS;  Service: Ophthalmology;  Laterality:  Right;   CATARACT EXTRACTION W/PHACO  03/28/2011   Procedure: CATARACT EXTRACTION PHACO AND INTRAOCULAR LENS PLACEMENT (IOC);  Surgeon: Gemma Payor;  Location: [facility] ORS;  Service: Ophthalmology;  Laterality: Left;  CDE 7.27   ENTEROSCOPY N/A 02/28/2023   Procedure: ENTEROSCOPY;  Surgeon: Iva Boop, MD;  Location: [facility] ENDOSCOPY;  Service: Gastroenterology;  Laterality: N/A;   ESOPHAGOGASTRODUODENOSCOPY (EGD) WITH PROPOFOL N/A 12/09/2022   Procedure: ESOPHAGOGASTRODUODENOSCOPY (EGD) WITH PROPOFOL;  Surgeon: Imogene Burn, MD;  Location: [facility] ENDOSCOPY;  Service: Gastroenterology;  Laterality: N/A;   HOT HEMOSTASIS N/A 02/28/2023   Procedure: HOT HEMOSTASIS (ARGON PLASMA COAGULATION/BICAP);  Surgeon: Iva Boop, MD;  Location: [facility] ENDOSCOPY;  Service: Gastroenterology;  Laterality: N/A;   IR IMAGING GUIDED PORT INSERTION  03/23/2023   SUBMUCOSAL TATTOO INJECTION  02/28/2023   Procedure: SUBMUCOSAL TATTOO INJECTION;  Surgeon: Iva Boop, MD;  Location: [facility] ENDOSCOPY;  Service: Gastroenterology;;  REVIEW OF SYSTEMS:   Review of Systems  Constitutional: Negative for appetite change, chills, fatigue, fever and unexpected weight change.  HENT: Negative for mouth sores, nosebleeds, sore throat and trouble swallowing.   Eyes: Negative for eye problems and icterus.  Respiratory: Negative for cough, hemoptysis, shortness of breath and wheezing.   Cardiovascular: Negative for chest pain and leg swelling.  Gastrointestinal: Negative for abdominal pain, constipation (mild none at this time), diarrhea, nausea and vomiting.  Genitourinary: Negative for bladder incontinence, difficulty urinating, dysuria, frequency and hematuria.   Musculoskeletal: Negative for back pain, gait problem, neck pain and neck stiffness.  Skin: Negative for itching and rash (resolved).  Neurological: Negative for dizziness, extremity weakness, gait problem, headaches, light-headedness and seizures.  Hematological: Negative  for adenopathy. Does not bruise/bleed easily.  Psychiatric/Behavioral: Negative for confusion, depression and sleep disturbance. The patient is not nervous/anxious.     PHYSICAL EXAMINATION:  There were no vitals taken for this visit.  ECOG PERFORMANCE STATUS: 2  Physical Exam  Constitutional: Oriented to person, place, and time and thin appearing female and, and in no distress. HENT:  Head: Normocephalic and atraumatic.  Mouth/Throat: Oropharynx is clear and moist. No oropharyngeal exudate.  Eyes: Conjunctivae are normal. Right eye exhibits no discharge. Left eye exhibits no discharge. No scleral icterus.  Neck: Normal range of motion. Neck supple.  Cardiovascular: Normal rate, regular rhythm, normal heart sounds and intact distal pulses.   Pulmonary/Chest: Effort normal and breath sounds normal. No respiratory distress. No wheezes. No rales.  Abdominal: Soft. Bowel sounds are normal. Exhibits no distension and no mass. There is no tenderness.  Musculoskeletal: Normal range of motion. Exhibits no edema.  Lymphadenopathy:    No cervical adenopathy.  Neurological: Alert and oriented to person, place, and time. Exhibits muscle wasting.  Examined in the wheelchair.  Skin: Skin is warm and dry. No rash noted. Not diaphoretic. No erythema. No pallor.  Psychiatric: Mood, memory and judgment normal.  Vitals reviewed.  LABORATORY DATA: Lab Results  Component Value Date   WBC 10.3 05/10/2023   HGB 8.5 (L) 05/10/2023   HCT 25.6 (L) 05/10/2023   MCV 89.5 05/10/2023   PLT 107 (L) 05/10/2023      Chemistry      Component Value Date/Time   NA 139 05/10/2023 1357   K 3.8 05/10/2023 1357   CL 107 05/10/2023 1357   CO2 29 05/10/2023 1357   BUN 14 05/10/2023 1357   CREATININE 0.86 05/10/2023 1357      Component Value Date/Time   CALCIUM 9.1 05/10/2023 1357   ALKPHOS 230 (H) 05/10/2023 1357   AST 26 05/10/2023 1357   ALT 27 05/10/2023 1357   BILITOT 0.3 05/10/2023 1357        RADIOGRAPHIC STUDIES:  No results found.   ASSESSMENT/PLAN:  This is a very pleasant 72 year old female with stage IV (T1c, N0, M1) Non-Small Cell Lung Cancer, adenocarcinoma. She presented with a spiculated left upper lobe lung nodule and large right adrenal gland mass. She was diagnosed in July 2024. She had molecular studies that showed she has KRASG12C which can be used in the second line setting.  PDL1 1%, she has STK 11.  Dr. Arbutus Ped does not feel that the patient is not a good candidate for the 4 drug combination and her performance status.   She underwent radiation to the adrenal lesion and the lung under the care of Dr. Mitzi Hansen. The last dose was on 02/20/23   She is  currently on palliative chemotherapy with carboplatin for an AUC of 5, Alimta 500 mg/m2 and immunotherapy with Keytruda 200 mg IV every 3 weeks. First dose on 04/05/23 which she tolerated well except for neutropenia for which she receives zarxio as needed. She is status post 2 cycles.   Labs were reviewed.  Her total white blood cell count is 3.9.  Her ANC is 2.4.    Recommend she proceed with cycle #3 today as scheduled.   I will arrange for a restaging CT scan of the chest abdomen, and pelvis prior to her next cycle of treatment. This will be performed at [facility].  I also sent a scheduling message to [facility] to see if they can performed her weekly labs and flush at [facility].   We will continue to monitor her labs closely on a weekly basis.  Will continue to perform weekly sample of blood bank's.  We will arrange for Zarxio on an as-needed basis.  Refrain for Zarxio for ANC is 0.6 or less.  We will arrange for a blood transfusion if her hemoglobin is 8 or less.  She will continue her iron supplement and blood thinner.   We will put chloraprep in her allergy list.   The patient was advised to call immediately if she has any concerning symptoms in the interval. The patient voices understanding  of current disease status and treatment options and is in agreement with the current care plan. All questions were answered. The patient knows to call the clinic with any problems, questions or concerns. We can certainly see the patient much sooner if necessary        No orders of the defined types were placed in this encounter.     The total time spent in the appointment was 20-29 minutes  Tyson Masin L Viren Lebeau, [name] 05/13/23

## 2023-05-16 MED FILL — Fosaprepitant Dimeglumine For IV Infusion 150 MG (Base Eq): INTRAVENOUS | Qty: 5 | Status: AC

## 2023-05-17 ENCOUNTER — Other Ambulatory Visit: Payer: Self-pay

## 2023-05-17 ENCOUNTER — Encounter: Payer: Self-pay | Admitting: Cardiology

## 2023-05-17 ENCOUNTER — Inpatient Hospital Stay: Payer: Medicare HMO

## 2023-05-17 ENCOUNTER — Other Ambulatory Visit: Payer: Self-pay | Admitting: Physician Assistant

## 2023-05-17 ENCOUNTER — Inpatient Hospital Stay (HOSPITAL_BASED_OUTPATIENT_CLINIC_OR_DEPARTMENT_OTHER): Payer: Medicare HMO | Admitting: Physician Assistant

## 2023-05-17 ENCOUNTER — Encounter: Payer: Self-pay | Admitting: Internal Medicine

## 2023-05-17 VITALS — BP 127/63 | HR 77 | Temp 97.7°F | Resp 18 | Wt 126.0 lb

## 2023-05-17 DIAGNOSIS — C3412 Malignant neoplasm of upper lobe, left bronchus or lung: Secondary | ICD-10-CM

## 2023-05-17 DIAGNOSIS — C3492 Malignant neoplasm of unspecified part of left bronchus or lung: Secondary | ICD-10-CM | POA: Diagnosis not present

## 2023-05-17 DIAGNOSIS — Z5111 Encounter for antineoplastic chemotherapy: Secondary | ICD-10-CM | POA: Diagnosis not present

## 2023-05-17 DIAGNOSIS — Z95828 Presence of other vascular implants and grafts: Secondary | ICD-10-CM

## 2023-05-17 DIAGNOSIS — T451X5A Adverse effect of antineoplastic and immunosuppressive drugs, initial encounter: Secondary | ICD-10-CM

## 2023-05-17 DIAGNOSIS — Z5112 Encounter for antineoplastic immunotherapy: Secondary | ICD-10-CM

## 2023-05-17 LAB — CMP (CANCER CENTER ONLY)
ALT: 26 U/L (ref 0–44)
AST: 34 U/L (ref 15–41)
Albumin: 3.2 g/dL — ABNORMAL LOW (ref 3.5–5.0)
Alkaline Phosphatase: 218 U/L — ABNORMAL HIGH (ref 38–126)
Anion gap: 7 (ref 5–15)
BUN: 15 mg/dL (ref 8–23)
CO2: 27 mmol/L (ref 22–32)
Calcium: 9.3 mg/dL (ref 8.9–10.3)
Chloride: 104 mmol/L (ref 98–111)
Creatinine: 0.85 mg/dL (ref 0.44–1.00)
GFR, Estimated: >60 mL/min (ref >60)
Glucose, Bld: 111 mg/dL — ABNORMAL HIGH (ref 70–99)
Potassium: 3.4 mmol/L — ABNORMAL LOW (ref 3.5–5.1)
Sodium: 138 mmol/L (ref 135–145)
Total Bilirubin: 0.3 mg/dL (ref <1.2)
Total Protein: 7.7 g/dL (ref 6.5–8.1)

## 2023-05-17 LAB — CBC WITH DIFFERENTIAL (CANCER CENTER ONLY)
Abs Immature Granulocytes: 0.1 10*3/uL — ABNORMAL HIGH (ref 0.00–0.07)
Basophils Absolute: 0 10*3/uL (ref 0.0–0.1)
Basophils Relative: 1 %
Eosinophils Absolute: 0.1 10*3/uL (ref 0.0–0.5)
Eosinophils Relative: 2 %
HCT: 28.6 % — ABNORMAL LOW (ref 36.0–46.0)
Hemoglobin: 9.2 g/dL — ABNORMAL LOW (ref 12.0–15.0)
Immature Granulocytes: 3 %
Lymphocytes Relative: 20 %
Lymphs Abs: 0.8 10*3/uL (ref 0.7–4.0)
MCH: 28.8 pg (ref 26.0–34.0)
MCHC: 32.2 g/dL (ref 30.0–36.0)
MCV: 89.4 fL (ref 80.0–100.0)
Monocytes Absolute: 0.6 10*3/uL (ref 0.1–1.0)
Monocytes Relative: 15 %
Neutro Abs: 2.4 10*3/uL (ref 1.7–7.7)
Neutrophils Relative %: 59 %
Platelet Count: 215 10*3/uL (ref 150–400)
RBC: 3.2 MIL/uL — ABNORMAL LOW (ref 3.87–5.11)
RDW: 22.6 % — ABNORMAL HIGH (ref 11.5–15.5)
WBC Count: 3.9 10*3/uL — ABNORMAL LOW (ref 4.0–10.5)
nRBC: 0 % (ref 0.0–0.2)

## 2023-05-17 LAB — SAMPLE TO BLOOD BANK

## 2023-05-17 MED ORDER — SODIUM CHLORIDE 0.9 % IV SOLN
200.0000 mg | Freq: Once | INTRAVENOUS | Status: AC
  Administered 2023-05-17: 200 mg via INTRAVENOUS
  Filled 2023-05-17: qty 200

## 2023-05-17 MED ORDER — SODIUM CHLORIDE 0.9 % IV SOLN
500.0000 mg/m2 | Freq: Once | INTRAVENOUS | Status: AC
  Administered 2023-05-17: 800 mg via INTRAVENOUS
  Filled 2023-05-17: qty 20

## 2023-05-17 MED ORDER — SODIUM CHLORIDE 0.9 % IV SOLN: Freq: Once | INTRAVENOUS | Status: AC

## 2023-05-17 MED ORDER — SODIUM CHLORIDE 0.9 % IV SOLN
150.0000 mg | Freq: Once | INTRAVENOUS | Status: AC
  Administered 2023-05-17: 150 mg via INTRAVENOUS
  Filled 2023-05-17: qty 150

## 2023-05-17 MED ORDER — SODIUM CHLORIDE 0.9% FLUSH
10.0000 mL | Freq: Once | INTRAVENOUS | Status: AC
Start: 2023-05-17 — End: 2023-05-17
  Administered 2023-05-17: 10 mL

## 2023-05-17 MED ORDER — PALONOSETRON HCL INJECTION 0.25 MG/5ML
0.2500 mg | Freq: Once | INTRAVENOUS | Status: AC
  Administered 2023-05-17: 0.25 mg via INTRAVENOUS
  Filled 2023-05-17: qty 5

## 2023-05-17 MED ORDER — SODIUM CHLORIDE 0.9 % IV SOLN
347.5000 mg | Freq: Once | INTRAVENOUS | Status: AC
  Administered 2023-05-17: 350 mg via INTRAVENOUS
  Filled 2023-05-17: qty 35

## 2023-05-17 MED ORDER — SODIUM CHLORIDE 0.9% FLUSH
10.0000 mL | INTRAVENOUS | Status: DC | PRN
  Administered 2023-05-17: 10 mL

## 2023-05-17 MED ORDER — CYANOCOBALAMIN 1000 MCG/ML IJ SOLN
1000.0000 ug | Freq: Once | INTRAMUSCULAR | Status: AC
  Administered 2023-05-17: 1000 ug via INTRAMUSCULAR
  Filled 2023-05-17: qty 1

## 2023-05-17 MED ORDER — HEPARIN SOD (PORK) LOCK FLUSH 100 UNIT/ML IV SOLN
500.0000 [IU] | Freq: Once | INTRAVENOUS | Status: AC | PRN
  Administered 2023-05-17: 500 [IU]

## 2023-05-17 MED ORDER — DEXAMETHASONE SODIUM PHOSPHATE 10 MG/ML IJ SOLN
10.0000 mg | Freq: Once | INTRAMUSCULAR | Status: AC
  Administered 2023-05-17: 10 mg via INTRAVENOUS
  Filled 2023-05-17: qty 1

## 2023-05-17 NOTE — Telephone Encounter (Signed)
Error

## 2023-05-17 NOTE — Patient Instructions (Signed)
 [facility] - [facility]  Discharge Instructions: Thank you for choosing [facility] to provide your oncology and hematology care.   If you have a lab appointment with the Cancer Center, please go directly to the Cancer Center and check in at the registration area.   Wear comfortable clothing and clothing appropriate for easy access to any Portacath or PICC line.   We strive to give you quality time with your provider. You may need to reschedule your appointment if you arrive late (15 or more minutes).  Arriving late affects you and other patients whose appointments are after yours.  Also, if you miss three or more appointments without notifying the office, you may be dismissed from the clinic at the provider's discretion.      For prescription refill requests, have your pharmacy contact our office and allow 72 hours for refills to be completed.    Today you received the following chemotherapy and/or immunotherapy agents: pembrolizumab, pemetrexed, and carboplatin      To help prevent nausea and vomiting after your treatment, we encourage you to take your nausea medication as directed.  BELOW ARE SYMPTOMS THAT SHOULD BE REPORTED IMMEDIATELY: *FEVER GREATER THAN 100.4 F (38 C) OR HIGHER *CHILLS OR SWEATING *NAUSEA AND VOMITING THAT IS NOT CONTROLLED WITH YOUR NAUSEA MEDICATION *UNUSUAL SHORTNESS OF BREATH *UNUSUAL BRUISING OR BLEEDING *URINARY PROBLEMS (pain or burning when urinating, or frequent urination) *BOWEL PROBLEMS (unusual diarrhea, constipation, pain near the anus) TENDERNESS IN MOUTH AND THROAT WITH OR WITHOUT PRESENCE OF ULCERS (sore throat, sores in mouth, or a toothache) UNUSUAL RASH, SWELLING OR PAIN  UNUSUAL VAGINAL DISCHARGE OR ITCHING   Items with * indicate a potential emergency and should be followed up as soon as possible or go to the Emergency Department if any problems should occur.  Please show the  CHEMOTHERAPY ALERT CARD or IMMUNOTHERAPY ALERT CARD at check-in to the Emergency Department and triage nurse.  Should you have questions after your visit or need to cancel or reschedule your appointment, please contact [facility] - [facility]  Dept: [phone]  and follow the prompts.  Office hours are 8:00 a.m. to 4:30 p.m. Monday - Friday. Please note that voicemails left after 4:00 p.m. may not be returned until the following business day.  We are closed weekends and major holidays. You have access to a nurse at all times for urgent questions. Please call the main number to the clinic Dept: [phone] and follow the prompts.   For any non-urgent questions, you may also contact your provider using MyChart. We now offer e-Visits for anyone 39 and older to request care online for non-urgent symptoms. For details visit [email or web address].   Also download the MyChart app! Go to the app store, search "MyChart", open the app, select [place], and log in with your MyChart username and password.

## 2023-05-18 ENCOUNTER — Telehealth: Payer: Self-pay

## 2023-05-18 ENCOUNTER — Other Ambulatory Visit: Payer: Self-pay

## 2023-05-18 NOTE — Telephone Encounter (Signed)
Patients daughter called about scheduling weekly port flush with lab appts at [facility] because it is closer.  Contacted [facility] scheduler, Amy, and she changed the weekly port flush with lab appts to [facility].  I canceled weekly port flush with lab appts here for [facility]. Informed daughter that pts CT is scheduled for [facility] and not [facility].  Daughter said she was going to call and change the CT appt to [facility]. Daughter was pleased with appt changes and verbalized understanding.

## 2023-05-24 ENCOUNTER — Inpatient Hospital Stay: Payer: Medicare HMO

## 2023-05-24 ENCOUNTER — Other Ambulatory Visit: Payer: Medicare HMO

## 2023-05-24 DIAGNOSIS — Z5112 Encounter for antineoplastic immunotherapy: Secondary | ICD-10-CM | POA: Diagnosis not present

## 2023-05-24 DIAGNOSIS — C3492 Malignant neoplasm of unspecified part of left bronchus or lung: Secondary | ICD-10-CM

## 2023-05-24 DIAGNOSIS — R918 Other nonspecific abnormal finding of lung field: Secondary | ICD-10-CM

## 2023-05-24 LAB — CBC WITH DIFFERENTIAL/PLATELET
Abs Immature Granulocytes: 0.01 10*3/uL (ref 0.00–0.07)
Basophils Absolute: 0 10*3/uL (ref 0.0–0.1)
Basophils Relative: 1 %
Eosinophils Absolute: 0 10*3/uL (ref 0.0–0.5)
Eosinophils Relative: 1 %
HCT: 28.6 % — ABNORMAL LOW (ref 36.0–46.0)
Hemoglobin: 9.1 g/dL — ABNORMAL LOW (ref 12.0–15.0)
Immature Granulocytes: 1 %
Lymphocytes Relative: 36 %
Lymphs Abs: 0.5 10*3/uL — ABNORMAL LOW (ref 0.7–4.0)
MCH: 28.8 pg (ref 26.0–34.0)
MCHC: 31.8 g/dL (ref 30.0–36.0)
MCV: 90.5 fL (ref 80.0–100.0)
Monocytes Absolute: 0.2 10*3/uL (ref 0.1–1.0)
Monocytes Relative: 14 %
Neutro Abs: 0.7 10*3/uL — ABNORMAL LOW (ref 1.7–7.7)
Neutrophils Relative %: 47 %
Platelets: 214 10*3/uL (ref 150–400)
RBC: 3.16 MIL/uL — ABNORMAL LOW (ref 3.87–5.11)
RDW: 21.5 % — ABNORMAL HIGH (ref 11.5–15.5)
WBC: 1.5 10*3/uL — ABNORMAL LOW (ref 4.0–10.5)
nRBC: 0 % (ref 0.0–0.2)

## 2023-05-24 LAB — COMPREHENSIVE METABOLIC PANEL
ALT: 37 U/L (ref 0–44)
AST: 44 U/L — ABNORMAL HIGH (ref 15–41)
Albumin: 3.1 g/dL — ABNORMAL LOW (ref 3.5–5.0)
Alkaline Phosphatase: 183 U/L — ABNORMAL HIGH (ref 38–126)
Anion gap: 8 (ref 5–15)
BUN: 35 mg/dL — ABNORMAL HIGH (ref 8–23)
CO2: 24 mmol/L (ref 22–32)
Calcium: 9.1 mg/dL (ref 8.9–10.3)
Chloride: 102 mmol/L (ref 98–111)
Creatinine, Ser: 0.83 mg/dL (ref 0.44–1.00)
GFR, Estimated: >60 mL/min (ref >60)
Glucose, Bld: 117 mg/dL — ABNORMAL HIGH (ref 70–99)
Potassium: 3.8 mmol/L (ref 3.5–5.1)
Sodium: 134 mmol/L — ABNORMAL LOW (ref 135–145)
Total Bilirubin: 0.4 mg/dL (ref <1.2)
Total Protein: 7.8 g/dL (ref 6.5–8.1)

## 2023-05-24 LAB — SAMPLE TO BLOOD BANK

## 2023-05-24 MED ORDER — SODIUM CHLORIDE 0.9% FLUSH
10.0000 mL | Freq: Once | INTRAVENOUS | Status: AC
  Administered 2023-05-24: 10 mL via INTRAVENOUS

## 2023-05-24 MED ORDER — HEPARIN SOD (PORK) LOCK FLUSH 100 UNIT/ML IV SOLN
500.0000 [IU] | Freq: Once | INTRAVENOUS | Status: AC
  Administered 2023-05-24: 500 [IU] via INTRAVENOUS

## 2023-05-24 NOTE — Patient Instructions (Signed)
[facility] [place] - A DEPT OF [facility]  Discharge Instructions: Thank you for choosing [facility] to provide your oncology and hematology care.  If you have a lab appointment with the Cancer Center - please note that after April 8th, 2024, all labs will be drawn in the cancer center.  You do not have to check in or register with the main entrance as you have in the past but will complete your check-in in the cancer center.  Wear comfortable clothing and clothing appropriate for easy access to any Portacath or PICC line.   We strive to give you quality time with your provider. You may need to reschedule your appointment if you arrive late (15 or more minutes).  Arriving late affects you and other patients whose appointments are after yours.  Also, if you miss three or more appointments without notifying the office, you may be dismissed from the clinic at the provider's discretion.      For prescription refill requests, have your pharmacy contact our office and allow 72 hours for refills to be completed.    Today you received the following port flush with lab draw. Return as scheduled.   To help prevent nausea and vomiting after your treatment, we encourage you to take your nausea medication as directed.  BELOW ARE SYMPTOMS THAT SHOULD BE REPORTED IMMEDIATELY: *FEVER GREATER THAN 100.4 F (38 C) OR HIGHER *CHILLS OR SWEATING *NAUSEA AND VOMITING THAT IS NOT CONTROLLED WITH YOUR NAUSEA MEDICATION *UNUSUAL SHORTNESS OF BREATH *UNUSUAL BRUISING OR BLEEDING *URINARY PROBLEMS (pain or burning when urinating, or frequent urination) *BOWEL PROBLEMS (unusual diarrhea, constipation, pain near the anus) TENDERNESS IN MOUTH AND THROAT WITH OR WITHOUT PRESENCE OF ULCERS (sore throat, sores in mouth, or a toothache) UNUSUAL RASH, SWELLING OR PAIN  UNUSUAL VAGINAL DISCHARGE OR ITCHING   Items with * indicate a potential emergency and should be followed up as  soon as possible or go to the Emergency Department if any problems should occur.  Please show the CHEMOTHERAPY ALERT CARD or IMMUNOTHERAPY ALERT CARD at check-in to the Emergency Department and triage nurse.  Should you have questions after your visit or need to cancel or reschedule your appointment, please contact [facility] [place] - A DEPT OF [facility] [phone]  and follow the prompts.  Office hours are 8:00 a.m. to 4:30 p.m. Monday - Friday. Please note that voicemails left after 4:00 p.m. may not be returned until the following business day.  We are closed weekends and major holidays. You have access to a nurse at all times for urgent questions. Please call the main number to the clinic [phone] and follow the prompts.  For any non-urgent questions, you may also contact your provider using MyChart. We now offer e-Visits for anyone 43 and older to request care online for non-urgent symptoms. For details visit [email or web address].   Also download the MyChart app! Go to the app store, search "MyChart", open the app, select [place], and log in with your MyChart username and password.

## 2023-05-24 NOTE — Progress Notes (Signed)
Port flushed with good blood return noted. No bruising or swelling at site. Bandaid applied and patient discharged in satisfactory condition. VVS stable with no signs or symptoms of distressed noted. 

## 2023-05-26 ENCOUNTER — Encounter: Payer: Self-pay | Admitting: Physician Assistant

## 2023-05-26 ENCOUNTER — Encounter: Payer: Self-pay | Admitting: Internal Medicine

## 2023-05-29 ENCOUNTER — Ambulatory Visit (HOSPITAL_COMMUNITY): Payer: Medicare HMO

## 2023-05-30 ENCOUNTER — Encounter (HOSPITAL_COMMUNITY): Payer: Self-pay

## 2023-05-30 ENCOUNTER — Other Ambulatory Visit: Payer: Medicare HMO

## 2023-05-30 ENCOUNTER — Ambulatory Visit (HOSPITAL_COMMUNITY)
Admission: RE | Admit: 2023-05-30 | Discharge: 2023-05-30 | Disposition: A | Discharge status: Home / Self Care | Payer: Medicare HMO | Source: Ambulatory Visit | Attending: Physician Assistant | Admitting: Physician Assistant

## 2023-05-30 DIAGNOSIS — D500 Iron deficiency anemia secondary to blood loss (chronic): Secondary | ICD-10-CM | POA: Insufficient documentation

## 2023-05-30 DIAGNOSIS — K760 Fatty (change of) liver, not elsewhere classified: Secondary | ICD-10-CM | POA: Diagnosis not present

## 2023-05-30 DIAGNOSIS — C3492 Malignant neoplasm of unspecified part of left bronchus or lung: Secondary | ICD-10-CM | POA: Diagnosis not present

## 2023-05-30 DIAGNOSIS — C3490 Malignant neoplasm of unspecified part of unspecified bronchus or lung: Secondary | ICD-10-CM | POA: Diagnosis not present

## 2023-05-30 MED ORDER — IOHEXOL 300 MG/ML  SOLN
100.0000 mL | Freq: Once | INTRAMUSCULAR | Status: AC | PRN
  Administered 2023-05-30: 100 mL via INTRAVENOUS

## 2023-05-30 MED ORDER — HEPARIN SOD (PORK) LOCK FLUSH 100 UNIT/ML IV SOLN: 500.0000 [IU] | Freq: Every day | INTRAVENOUS | Status: DC | PRN

## 2023-05-30 MED ORDER — HEPARIN SOD (PORK) LOCK FLUSH 100 UNIT/ML IV SOLN: 250.0000 [IU] | INTRAVENOUS | Status: DC | PRN

## 2023-06-01 ENCOUNTER — Ambulatory Visit (HOSPITAL_COMMUNITY): Payer: Medicare HMO

## 2023-06-01 ENCOUNTER — Inpatient Hospital Stay: Payer: Medicare HMO

## 2023-06-01 DIAGNOSIS — C3492 Malignant neoplasm of unspecified part of left bronchus or lung: Secondary | ICD-10-CM

## 2023-06-01 DIAGNOSIS — R918 Other nonspecific abnormal finding of lung field: Secondary | ICD-10-CM

## 2023-06-01 DIAGNOSIS — Z5112 Encounter for antineoplastic immunotherapy: Secondary | ICD-10-CM | POA: Diagnosis not present

## 2023-06-01 LAB — SAMPLE TO BLOOD BANK

## 2023-06-01 LAB — CBC WITH DIFFERENTIAL/PLATELET
Abs Immature Granulocytes: 0.01 10*3/uL (ref 0.00–0.07)
Basophils Absolute: 0 10*3/uL (ref 0.0–0.1)
Basophils Relative: 0 %
Eosinophils Absolute: 0 10*3/uL (ref 0.0–0.5)
Eosinophils Relative: 1 %
HCT: 26.5 % — ABNORMAL LOW (ref 36.0–46.0)
Hemoglobin: 8.7 g/dL — ABNORMAL LOW (ref 12.0–15.0)
Immature Granulocytes: 0 %
Lymphocytes Relative: 32 %
Lymphs Abs: 0.9 10*3/uL (ref 0.7–4.0)
MCH: 30.5 pg (ref 26.0–34.0)
MCHC: 32.8 g/dL (ref 30.0–36.0)
MCV: 93 fL (ref 80.0–100.0)
Monocytes Absolute: 0.7 10*3/uL (ref 0.1–1.0)
Monocytes Relative: 28 %
Neutro Abs: 1 10*3/uL — ABNORMAL LOW (ref 1.7–7.7)
Neutrophils Relative %: 39 %
Platelets: 132 10*3/uL — ABNORMAL LOW (ref 150–400)
RBC: 2.85 MIL/uL — ABNORMAL LOW (ref 3.87–5.11)
RDW: 22.8 % — ABNORMAL HIGH (ref 11.5–15.5)
WBC: 2.6 10*3/uL — ABNORMAL LOW (ref 4.0–10.5)
nRBC: 0.8 % — ABNORMAL HIGH (ref 0.0–0.2)

## 2023-06-01 LAB — COMPREHENSIVE METABOLIC PANEL
ALT: 31 U/L (ref 0–44)
AST: 41 U/L (ref 15–41)
Albumin: 3.1 g/dL — ABNORMAL LOW (ref 3.5–5.0)
Alkaline Phosphatase: 172 U/L — ABNORMAL HIGH (ref 38–126)
Anion gap: 5 (ref 5–15)
BUN: 20 mg/dL (ref 8–23)
CO2: 26 mmol/L (ref 22–32)
Calcium: 8.8 mg/dL — ABNORMAL LOW (ref 8.9–10.3)
Chloride: 104 mmol/L (ref 98–111)
Creatinine, Ser: 0.82 mg/dL (ref 0.44–1.00)
GFR, Estimated: >60 mL/min (ref >60)
Glucose, Bld: 89 mg/dL (ref 70–99)
Potassium: 4.2 mmol/L (ref 3.5–5.1)
Sodium: 135 mmol/L (ref 135–145)
Total Bilirubin: 0.5 mg/dL (ref <1.2)
Total Protein: 7.5 g/dL (ref 6.5–8.1)

## 2023-06-01 MED ORDER — SODIUM CHLORIDE 0.9% FLUSH
10.0000 mL | Freq: Once | INTRAVENOUS | Status: AC
  Administered 2023-06-01: 10 mL via INTRAVENOUS

## 2023-06-01 MED ORDER — HEPARIN SOD (PORK) LOCK FLUSH 100 UNIT/ML IV SOLN
500.0000 [IU] | Freq: Once | INTRAVENOUS | Status: AC
  Administered 2023-06-01: 500 [IU] via INTRAVENOUS

## 2023-06-01 NOTE — Progress Notes (Signed)
Patients port flushed without difficulty.  Port cleaned with iodine per orders. Labs drawn per orders. Good blood return noted with no bruising or swelling noted at site.  Band aid applied.  VSS with discharge and left in satisfactory condition with no s/s of distress noted. All follow ups as scheduled.       Erin Pacheco [employer]

## 2023-06-02 ENCOUNTER — Telehealth: Payer: Self-pay | Admitting: Internal Medicine

## 2023-06-06 ENCOUNTER — Other Ambulatory Visit: Payer: Medicare HMO

## 2023-06-08 ENCOUNTER — Ambulatory Visit: Payer: Medicare HMO | Admitting: Physician Assistant

## 2023-06-08 ENCOUNTER — Inpatient Hospital Stay: Payer: Medicare HMO | Attending: Physician Assistant

## 2023-06-08 ENCOUNTER — Inpatient Hospital Stay: Payer: Medicare HMO

## 2023-06-08 ENCOUNTER — Inpatient Hospital Stay: Payer: Medicare HMO | Admitting: Internal Medicine

## 2023-06-08 VITALS — BP 123/75 | HR 68 | Temp 98.3°F | Resp 16 | Ht 63.0 in | Wt 127.3 lb

## 2023-06-08 DIAGNOSIS — C7971 Secondary malignant neoplasm of right adrenal gland: Secondary | ICD-10-CM | POA: Diagnosis not present

## 2023-06-08 DIAGNOSIS — C3492 Malignant neoplasm of unspecified part of left bronchus or lung: Secondary | ICD-10-CM

## 2023-06-08 DIAGNOSIS — Z95828 Presence of other vascular implants and grafts: Secondary | ICD-10-CM

## 2023-06-08 DIAGNOSIS — C3412 Malignant neoplasm of upper lobe, left bronchus or lung: Secondary | ICD-10-CM

## 2023-06-08 DIAGNOSIS — Z79620 Long term (current) use of immunosuppressive biologic: Secondary | ICD-10-CM | POA: Diagnosis not present

## 2023-06-08 DIAGNOSIS — Z5112 Encounter for antineoplastic immunotherapy: Secondary | ICD-10-CM | POA: Insufficient documentation

## 2023-06-08 DIAGNOSIS — Z5111 Encounter for antineoplastic chemotherapy: Secondary | ICD-10-CM | POA: Insufficient documentation

## 2023-06-08 LAB — CBC WITH DIFFERENTIAL (CANCER CENTER ONLY)
Abs Immature Granulocytes: 0.01 10*3/uL (ref 0.00–0.07)
Basophils Absolute: 0 10*3/uL (ref 0.0–0.1)
Basophils Relative: 0 %
Eosinophils Absolute: 0 10*3/uL (ref 0.0–0.5)
Eosinophils Relative: 1 %
HCT: 28.8 % — ABNORMAL LOW (ref 36.0–46.0)
Hemoglobin: 9.9 g/dL — ABNORMAL LOW (ref 12.0–15.0)
Immature Granulocytes: 0 %
Lymphocytes Relative: 25 %
Lymphs Abs: 0.7 10*3/uL (ref 0.7–4.0)
MCH: 31 pg (ref 26.0–34.0)
MCHC: 34.4 g/dL (ref 30.0–36.0)
MCV: 90.3 fL (ref 80.0–100.0)
Monocytes Absolute: 0.7 10*3/uL (ref 0.1–1.0)
Monocytes Relative: 24 %
Neutro Abs: 1.4 10*3/uL — ABNORMAL LOW (ref 1.7–7.7)
Neutrophils Relative %: 50 %
Platelet Count: 172 10*3/uL (ref 150–400)
RBC: 3.19 MIL/uL — ABNORMAL LOW (ref 3.87–5.11)
RDW: 22.7 % — ABNORMAL HIGH (ref 11.5–15.5)
WBC Count: 2.8 10*3/uL — ABNORMAL LOW (ref 4.0–10.5)
nRBC: 0 % (ref 0.0–0.2)

## 2023-06-08 LAB — CMP (CANCER CENTER ONLY)
ALT: 27 U/L (ref 0–44)
AST: 38 U/L (ref 15–41)
Albumin: 3.4 g/dL — ABNORMAL LOW (ref 3.5–5.0)
Alkaline Phosphatase: 192 U/L — ABNORMAL HIGH (ref 38–126)
Anion gap: 4 — ABNORMAL LOW (ref 5–15)
BUN: 33 mg/dL — ABNORMAL HIGH (ref 8–23)
CO2: 29 mmol/L (ref 22–32)
Calcium: 9.3 mg/dL (ref 8.9–10.3)
Chloride: 104 mmol/L (ref 98–111)
Creatinine: 0.91 mg/dL (ref 0.44–1.00)
GFR, Estimated: >60 mL/min (ref >60)
Glucose, Bld: 81 mg/dL (ref 70–99)
Potassium: 4.6 mmol/L (ref 3.5–5.1)
Sodium: 137 mmol/L (ref 135–145)
Total Bilirubin: 0.3 mg/dL (ref 0.0–1.2)
Total Protein: 7.7 g/dL (ref 6.5–8.1)

## 2023-06-08 MED ORDER — SODIUM CHLORIDE 0.9% FLUSH
10.0000 mL | Freq: Once | INTRAVENOUS | Status: AC
Start: 2023-06-08 — End: 2023-06-08
  Administered 2023-06-08: 10 mL via INTRAVENOUS

## 2023-06-08 MED ORDER — SODIUM CHLORIDE 0.9 % IV SOLN
347.5000 mg | Freq: Once | INTRAVENOUS | Status: AC
Start: 2023-06-08 — End: 2023-06-08
  Administered 2023-06-08: 350 mg via INTRAVENOUS
  Filled 2023-06-08: qty 35

## 2023-06-08 MED ORDER — SODIUM CHLORIDE 0.9 % IV SOLN: Freq: Once | INTRAVENOUS | Status: AC

## 2023-06-08 MED ORDER — SODIUM CHLORIDE 0.9 % IV SOLN
500.0000 mg/m2 | Freq: Once | INTRAVENOUS | Status: AC
  Administered 2023-06-08: 800 mg via INTRAVENOUS
  Filled 2023-06-08: qty 20

## 2023-06-08 MED ORDER — SODIUM CHLORIDE 0.9 % IV SOLN
10.0000 mg | Freq: Once | INTRAVENOUS | Status: DC
Start: 2023-06-08 — End: 2023-06-08

## 2023-06-08 MED ORDER — SODIUM CHLORIDE 0.9 % IV SOLN
200.0000 mg | Freq: Once | INTRAVENOUS | Status: AC
  Administered 2023-06-08: 200 mg via INTRAVENOUS
  Filled 2023-06-08: qty 200

## 2023-06-08 MED ORDER — PALONOSETRON HCL INJECTION 0.25 MG/5ML
0.2500 mg | Freq: Once | INTRAVENOUS | Status: AC
  Administered 2023-06-08: 0.25 mg via INTRAVENOUS
  Filled 2023-06-08: qty 5

## 2023-06-08 MED ORDER — SODIUM CHLORIDE 0.9 % IV SOLN
150.0000 mg | Freq: Once | INTRAVENOUS | Status: AC
  Administered 2023-06-08: 150 mg via INTRAVENOUS
  Filled 2023-06-08: qty 5
  Filled 2023-06-08: qty 150

## 2023-06-08 MED ORDER — DEXAMETHASONE SODIUM PHOSPHATE 10 MG/ML IJ SOLN
10.0000 mg | Freq: Once | INTRAMUSCULAR | Status: AC
  Administered 2023-06-08: 10 mg via INTRAVENOUS
  Filled 2023-06-08: qty 1

## 2023-06-08 NOTE — Progress Notes (Signed)
 [facility] [phone]   [phone]  OFFICE PROGRESS NOTE  Erin Joesph DEL, [name] [address] [place] [address]  DIAGNOSIS:  Stage IV (T1c, N0, M1b) Non-Small Cell Lung Cancer, adenocarcinoma. She presented with a spiculated left upper lobe lung nodule and large right adrenal gland mass. She was diagnosed in July 2024. She had molecular studies that showed she has KRASG12C which can be used in the second line setting.    PDL1: 1%   PRIOR THERAPY: SBRT to the left upper lobe and right adrenal targets under the care of Dr. Dewey.  Last dose of treatment expected on 02/20/2023   CURRENT THERAPY: Systemic chemotherapy with carboplatin  for AUC of 5, Alimta  500 Mg/M2 and Keytruda  200 Mg IV every 3 weeks.  First dose April 05, 2023.  Status post 3 cycles  INTERVAL HISTORY: Erin Pacheco 73 y.o. female returns to the clinic today for follow-up visit accompanied by her daughter. Discussed the use of AI scribe software for clinical note transcription with the patient, who gave verbal consent to proceed.  History of Present Illness   The patient, a 73 year old diagnosed with stage four non-small cell lung cancer (NSCLC) with a positive KRAS G12C mutation in July 2024, has been undergoing chemotherapy and immunotherapy. The regimen includes carboplatin , Alimta , and Keytruda , administered every three weeks.  The patient reports feeling good overall, with no significant complaints except for discomfort in the right hip, which she has been managing with a patch and cream. She denies any nausea, vomiting, or other adverse effects from the treatment. There was a concern about weight loss after the last treatment cycle, but the patient has since regained weight, currently at 127 lbs.  The patient's overall condition appears stable, with no new symptoms or complaints.      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  MEDICATIONS:  Current Outpatient Medications  Medication Sig Dispense Refill   acetaminophen  (TYLENOL ) 325 MG tablet Take 2 tablets (650 mg total) by mouth every 6 (six) hours as needed for mild pain (or Fever >/= 101).     apixaban  (ELIQUIS ) 5 MG TABS tablet Take 1 tablet (5 mg total) by mouth 2 (two) times daily. 60 tablet 5   Fluticasone -Umeclidin-Vilant (TRELEGY ELLIPTA ) 100-62.5-25 MCG/ACT AEPB Inhale 1 each into the lungs daily.     folic acid  (FOLVITE ) 1 MG tablet Take 1 tablet (1 mg total) by mouth daily. Start 7 days before pemetrexed  chemotherapy. Continue until 21 days after pemetrexed  completed. 100 tablet 3   lidocaine -prilocaine  (EMLA ) cream Apply to the Port-A-Cath site 30 minutes before treatment. 30 g 0   metoprolol  succinate (TOPROL -XL) 50 MG 24 hr tablet TAKE 1 TABLET BY MOUTH DAILY. TAKE WITH OR IMMEDIATELY FOLLOWING A MEAL. 60 tablet 5   Multiple Vitamin (MULTIVITAMIN) tablet Take 1 tablet by mouth daily.       ondansetron  (ZOFRAN ) 8 MG tablet Take 1 tablet (8 mg total) by mouth every 8 (eight) hours as needed for nausea or vomiting. 30 tablet 0   oxyCODONE  (OXY IR/ROXICODONE ) 5 MG immediate release tablet Take 1 tablet (5 mg total) by  mouth every 6 (six) hours as needed for moderate pain. 60 tablet 0   pantoprazole  (PROTONIX ) 40 MG tablet Take 1 tablet (40 mg total) by mouth daily before breakfast. 30 tablet 3   Polyethyl Glycol-Propyl Glycol (SYSTANE OP) Place 1 drop into both eyes daily as needed (dry eye).     prochlorperazine  (COMPAZINE ) 10 MG tablet Take 1 tablet (10 mg total) by mouth every 6 (six) hours as needed for nausea or vomiting. 30 tablet 1   sacubitril-valsartan (ENTRESTO ) 49-51 MG Take 1 tablet by mouth 2  (two) times daily. 60 tablet 5   spironolactone  (ALDACTONE ) 25 MG tablet Take 0.5 tablets (12.5 mg total) by mouth daily. 30 tablet 1   No current facility-administered medications for this visit.   Facility-Administered Medications Ordered in Other Visits  Medication Dose Route Frequency Provider Last Rate Last Admin   heparin  lock flush 100 unit/mL  500 Units Intracatheter Once Sherrod Sherrod, MD       sodium chloride  flush (NS) 0.9 % injection 10 mL  10 mL Intracatheter Once Sherrod Sherrod, MD        SURGICAL HISTORY:  Past Surgical History:  Procedure Laterality Date   ABDOMINAL HYSTERECTOMY     BILATERAL OOPHORECTOMY     BIOPSY  12/09/2022   Procedure: BIOPSY;  Surgeon: Federico Rosario BROCKS, MD;  Location: [facility] ENDOSCOPY;  Service: Gastroenterology;;   BRONCHIAL BIOPSY  01/09/2023   Procedure: BRONCHIAL BIOPSIES;  Surgeon: Brenna Adine CROME, DO;  Location: [facility] ENDOSCOPY;  Service: Pulmonary;;   BRONCHIAL NEEDLE ASPIRATION BIOPSY  01/09/2023   Procedure: BRONCHIAL NEEDLE ASPIRATION BIOPSIES;  Surgeon: Brenna Adine CROME, DO;  Location: [facility] ENDOSCOPY;  Service: Pulmonary;;   CATARACT EXTRACTION W/PHACO  12/27/2010   Procedure: CATARACT EXTRACTION PHACO AND INTRAOCULAR LENS PLACEMENT (IOC);  Surgeon: Cherene Mania;  Location: [facility] ORS;  Service: Ophthalmology;  Laterality: Right;   CATARACT EXTRACTION W/PHACO  03/28/2011   Procedure: CATARACT EXTRACTION PHACO AND INTRAOCULAR LENS PLACEMENT (IOC);  Surgeon: Cherene Mania;  Location: [facility] ORS;  Service: Ophthalmology;  Laterality: Left;  CDE 7.27   ENTEROSCOPY N/A 02/28/2023   Procedure: ENTEROSCOPY;  Surgeon: Avram Lupita BRAVO, MD;  Location: [facility] ENDOSCOPY;  Service: Gastroenterology;  Laterality: N/A;   ESOPHAGOGASTRODUODENOSCOPY (EGD) WITH PROPOFOL  N/A 12/09/2022   Procedure: ESOPHAGOGASTRODUODENOSCOPY (EGD) WITH PROPOFOL ;  Surgeon: Federico Rosario BROCKS, MD;  Location: [facility] ENDOSCOPY;  Service: Gastroenterology;  Laterality: N/A;   HOT HEMOSTASIS N/A 02/28/2023    Procedure: HOT HEMOSTASIS (ARGON PLASMA COAGULATION/BICAP);  Surgeon: Avram Lupita BRAVO, MD;  Location: [facility] ENDOSCOPY;  Service: Gastroenterology;  Laterality: N/A;   IR IMAGING GUIDED PORT INSERTION  03/23/2023   SUBMUCOSAL TATTOO INJECTION  02/28/2023   Procedure: SUBMUCOSAL TATTOO INJECTION;  Surgeon: Avram Lupita BRAVO, MD;  Location: [facility] ENDOSCOPY;  Service: Gastroenterology;;    REVIEW OF SYSTEMS:  Constitutional: positive for fatigue Eyes: negative Ears, nose, mouth, throat, and face: negative Respiratory: positive for dyspnea on exertion Cardiovascular: negative Gastrointestinal: negative Genitourinary:negative Integument/breast: negative Hematologic/lymphatic: negative Musculoskeletal:negative Neurological: negative Behavioral/Psych: negative Endocrine: negative Allergic/Immunologic: negative   PHYSICAL EXAMINATION: General appearance: alert, cooperative, fatigued, and no distress Head: Normocephalic, without obvious abnormality, atraumatic Neck: no adenopathy, no JVD, supple, symmetrical, trachea midline, and thyroid  not enlarged, symmetric, no tenderness/mass/nodules Lymph nodes: Cervical, supraclavicular, and axillary nodes normal. Resp: clear to auscultation bilaterally Back: symmetric, no curvature. ROM normal. No CVA tenderness. Cardio: regular rate and rhythm, S1, S2 normal, no murmur, click, rub or gallop GI: soft, non-tender; bowel sounds normal; no masses,  no organomegaly Extremities: extremities normal, atraumatic, no cyanosis or edema Neurologic: Alert and oriented X 3, normal strength and tone. Normal symmetric reflexes. Normal coordination and gait  ECOG PERFORMANCE STATUS: 1 - Symptomatic but completely ambulatory  Blood pressure 123/75, pulse 68, temperature 98.3 F (36.8 C), temperature source Temporal, resp. rate 16, height 5' 3 (1.6 m), weight 127 lb 4.8 oz (57.7 kg), SpO2 100%.  LABORATORY DATA: Lab Results  Component Value Date   WBC 2.8 (L) 06/08/2023    HGB 9.9 (L) 06/08/2023   HCT 28.8 (L) 06/08/2023   MCV 90.3 06/08/2023   PLT 172 06/08/2023      Chemistry      Component Value Date/Time   NA 137 06/08/2023 1356   K 4.6 06/08/2023 1356   CL 104 06/08/2023 1356   CO2 29 06/08/2023 1356   BUN 33 (H) 06/08/2023 1356   CREATININE 0.91 06/08/2023 1356      Component Value Date/Time   CALCIUM 9.3 06/08/2023 1356   ALKPHOS 192 (H) 06/08/2023 1356   AST 38 06/08/2023 1356   ALT 27 06/08/2023 1356   BILITOT 0.3 06/08/2023 1356       RADIOGRAPHIC STUDIES: CT CHEST ABDOMEN PELVIS W CONTRAST Result Date: 06/06/2023 CLINICAL DATA:  Non-small-cell lung cancer. Assess treatment response. On chemotherapy. Prior XRT. * Tracking Code: BO * EXAM: CT CHEST, ABDOMEN, AND PELVIS WITH CONTRAST TECHNIQUE: Multidetector CT imaging of the chest, abdomen and pelvis was performed following the standard protocol during bolus administration of intravenous contrast. RADIATION DOSE REDUCTION: This exam was performed according to the departmental dose-optimization program which includes automated exposure control, adjustment of the mA and/or kV according to patient size and/or use of iterative reconstruction technique. CONTRAST:  OMNIPAQUE  IOHEXOL  300 MG/ML  SOLN COMPARISON:  PET-CT 12/15/2022. Abdomen pelvis CT 01/12/2023. CTA chest 11/16/2022. other prior examinations as well. FINDINGS: CT CHEST FINDINGS Cardiovascular: Heart is slightly enlarged. Trace pericardial fluid. Coronary artery calcifications are seen. The thoracic aorta has a normal course and caliber with scattered partially calcified atherosclerotic plaque. Plaque also seen along the great vessels. Right IJ chest port in place with tip extending to the SVC right atrial junction region. Mediastinum/Nodes: Small thyroid  gland. Slight wall thickening along the course of the esophagus, nonspecific. This has seen previously. There is no specific abnormal lymph node enlargement identified in the  axillary regions, hilum or mediastinum. Lungs/Pleura: Areas of cylindrical bronchiectasis identified particularly along the lower lung zones. There are centrilobular emphysematous lung changes identified. Punctate calcified left lower lobe lung nodules identified consistent with old granulomatous disease. There is bandlike changes along the right lower lobe greater than middle lobe. Scarring and atelectatic changes are favored. Slightly more confluence opacity along the dependent right lower lobe, increased from previous. Spiculated nodule seen along the left suprahilar region is again identified. On previous examination this measured 2.4 x 2.1 cm and today on series 4, image 52 1.7 by 1.3 cm. Associated occlusion of the adjacent bronchus. The opacity seen extending superior and peripheral to the central lesion is slightly increased compared to the prior chest examinations, nonspecific. There are some increasing peripheral areas of interstitial septal thickening in the left upper lobe as well. Some subtle patchy ground-glass such as peripherally left upper lobe on series 4, image 56, nonspecific. Additional small spiculated nodular area along the lingula on series 4, image 89 measures 6 mm. On the prior examination this has more ill-defined ground-glass. More confluence today. Musculoskeletal: Curvature and degenerative changes along the  spine. CT ABDOMEN PELVIS FINDINGS Hepatobiliary: Diffuse fatty liver infiltration. Some patchy distribution of fat. Patent portal vein. Gallbladder is nondilated. Pancreas: Unremarkable. No pancreatic ductal dilatation or surrounding inflammatory changes. Spleen: Normal in size without focal abnormality. Adrenals/Urinary Tract: Left adrenal gland is preserved. Low-attenuation mass seen along the right adrenal gland. Previously this has measured at 6.5 x 4.0 cm and today on series 2, image 55 6.4 by 3.9 cm. Similar in size. Margins are less well defined and there is some adjacent  stranding. Mild atrophy of the kidneys, left-greater-than-right with benign Bosniak 1/2 right-sided posterior cyst, similar to previous. No enhancing renal mass or collecting system dilatation. The ureters have normal course and caliber down to the bladder. Preserved contours of the urinary bladder. Stomach/Bowel: On this non oral contrast exam stomach is underdistended. Small and large bowel are nondilated as well. Scattered colonic stool. Vascular/Lymphatic: Aortic atherosclerosis. No enlarged abdominal or pelvic lymph nodes. Reproductive: Status post hysterectomy. No adnexal masses. Other: Anasarca. Retroperitoneal mild stranding identified in the upper retroperitoneum, increased from previous. Musculoskeletal: Curvature of the lumbar spine with retrolisthesis at L4-5 and L5-S1. Advanced degenerative changes along the lower lumbar spine with the associated areas of stenosis at multiple levels from L3 through S1. Degenerative changes of the pelvis. IMPRESSION: Decreasing size left upper lobe suprahilar spiculated mass. No developing pleural effusion or new lymph node enlargement. There is a 6 mm solid nodule in the lingula. Previously this similar in size but less confluence. Attention on follow-up. Similar in size low-attenuation right adrenal mass with increasing adjacent stranding and ill definition. Attention on follow-up. Fatty liver infiltration. Electronically Signed   By: Ranell Bring M.D.   On: 06/06/2023 16:30    ASSESSMENT AND PLAN: This is a very pleasant 73 years old African-American female recently diagnosed with a stage IV (T1c, N0, M1b) non-small cell lung cancer, adenocarcinoma with positive KRAS G12C mutation and PD-L1 expression of 1%.  She started systemic chemotherapy with carboplatin  for AUC of 5, Alimta  500 Mg/M2 and Keytruda  200 Mg IV every 3 weeks.  She is status post 3 cycle of her treatment. The patient has been tolerating this treatment fairly well with no concerning adverse  effects. She had repeat CT scan of the chest, abdomen and pelvis performed recently.  I personally and independently reviewed the scan and discussed the result with the patient and her daughter today.  Her scan showed improvement of her disease with decrease in the size of the left upper lobe suprahilar spiculated mass.    Stage IV Non-Small Cell Lung Cancer (NSCLC) with KRAS G12C Mutation Diagnosed with stage IV NSCLC with KRAS G12C mutation in July 2024. Undergoing combined chemotherapy and immunotherapy with carboplatin , Alimta , and Keytruda  every three weeks. Today marks the fourth cycle. Reports feeling well with no significant side effects. Recent scans show improvement in cancer. Weight increased to 127 lbs from 117-119 lbs. Blood pressure and lab results are normal. Plan to continue Alimta  and Keytruda  every three weeks post fourth cycle and discontinue carboplatin . No metastasis noted. - Administer fourth cycle of carboplatin , Alimta , and Keytruda  today - Continue Alimta  and Keytruda  every three weeks post fourth cycle - Discontinue carboplatin  post fourth cycle - Schedule follow-up in three weeks  Right Hip Pain Right hip pain managed with a patch, cream, and a towel. Pain does not significantly impact overall well-being or treatment. - Continue current pain management regimen.   The patient was advised to call immediately if she has any concerning symptoms  in the interval. The patient voices understanding of current disease status and treatment options and is in agreement with the current care plan.  All questions were answered. The patient knows to call the clinic with any problems, questions or concerns. We can certainly see the patient much sooner if necessary. The total time spent in the appointment was 30 minutes.  Disclaimer: This note was dictated with voice recognition software. Similar sounding words can inadvertently be transcribed and may not be corrected upon review.

## 2023-06-08 NOTE — Patient Instructions (Signed)
 [facility] - [facility]  Discharge Instructions: Thank you for choosing [facility] to provide your oncology and hematology care.   If you have a lab appointment with the Cancer Center, please go directly to the Cancer Center and check in at the registration area.   Wear comfortable clothing and clothing appropriate for easy access to any Portacath or PICC line.   We strive to give you quality time with your provider. You may need to reschedule your appointment if you arrive late (15 or more minutes).  Arriving late affects you and other patients whose appointments are after yours.  Also, if you miss three or more appointments without notifying the office, you may be dismissed from the clinic at the provider's discretion.      For prescription refill requests, have your pharmacy contact our office and allow 72 hours for refills to be completed.    Today you received the following chemotherapy and/or immunotherapy agents: pembrolizumab, pemetrexed, and carboplatin      To help prevent nausea and vomiting after your treatment, we encourage you to take your nausea medication as directed.  BELOW ARE SYMPTOMS THAT SHOULD BE REPORTED IMMEDIATELY: *FEVER GREATER THAN 100.4 F (38 C) OR HIGHER *CHILLS OR SWEATING *NAUSEA AND VOMITING THAT IS NOT CONTROLLED WITH YOUR NAUSEA MEDICATION *UNUSUAL SHORTNESS OF BREATH *UNUSUAL BRUISING OR BLEEDING *URINARY PROBLEMS (pain or burning when urinating, or frequent urination) *BOWEL PROBLEMS (unusual diarrhea, constipation, pain near the anus) TENDERNESS IN MOUTH AND THROAT WITH OR WITHOUT PRESENCE OF ULCERS (sore throat, sores in mouth, or a toothache) UNUSUAL RASH, SWELLING OR PAIN  UNUSUAL VAGINAL DISCHARGE OR ITCHING   Items with * indicate a potential emergency and should be followed up as soon as possible or go to the Emergency Department if any problems should occur.  Please show the  CHEMOTHERAPY ALERT CARD or IMMUNOTHERAPY ALERT CARD at check-in to the Emergency Department and triage nurse.  Should you have questions after your visit or need to cancel or reschedule your appointment, please contact [facility] - [facility]  Dept: [phone]  and follow the prompts.  Office hours are 8:00 a.m. to 4:30 p.m. Monday - Friday. Please note that voicemails left after 4:00 p.m. may not be returned until the following business day.  We are closed weekends and major holidays. You have access to a nurse at all times for urgent questions. Please call the main number to the clinic Dept: [phone] and follow the prompts.   For any non-urgent questions, you may also contact your provider using MyChart. We now offer e-Visits for anyone 39 and older to request care online for non-urgent symptoms. For details visit [email or web address].   Also download the MyChart app! Go to the app store, search "MyChart", open the app, select [place], and log in with your MyChart username and password.

## 2023-06-09 ENCOUNTER — Telehealth: Payer: Self-pay | Admitting: Cardiology

## 2023-06-09 NOTE — Telephone Encounter (Signed)
 Pt c/o medication issue:  1. Name of Medication: apixaban  (ELIQUIS ) 5 MG TABS tablet   2. How are you currently taking this medication (dosage and times per day)? As written  3. Are you having a reaction (difficulty breathing--STAT)? no  4. What is your medication issue? Daughter called and said it was $173 and she cannot afford it

## 2023-06-09 NOTE — Telephone Encounter (Signed)
 Patient identification verified by 2 forms. Erin Cooks, Erin Pacheco    Called and spoke to patients daughter Erin [name] Pacheco states:   -the entresto  is costing [phone]   -is not sure what the cost of Eliquis  will be, has not attempted to renew   -would like samples of medication  Informed Erin Pacheco:   -no samples available at this time   -patient can apply for patient assistance for both Eliquis  and Entresto    -Patient can get 30 day free trial card for Entresto  and Eliquis    -Trial cards can only work if patient has never used them before   -copies of application and trial cards left at front desk for pick up    -complete patient portion of application and return to office with supporting documents  Erin Pacheco confirms patient has never used trial card for Eliquis  or Entresto   Erin Pacheco agrees with plan, no questions at this time    Copies of patient assistance application and trial cards left at front for pick up

## 2023-06-13 ENCOUNTER — Other Ambulatory Visit: Payer: Medicare HMO

## 2023-06-14 ENCOUNTER — Inpatient Hospital Stay: Payer: Medicare HMO

## 2023-06-14 VITALS — BP 117/62 | HR 74 | Temp 96.8°F | Resp 18

## 2023-06-14 DIAGNOSIS — Z5112 Encounter for antineoplastic immunotherapy: Secondary | ICD-10-CM | POA: Diagnosis not present

## 2023-06-14 DIAGNOSIS — R918 Other nonspecific abnormal finding of lung field: Secondary | ICD-10-CM

## 2023-06-14 DIAGNOSIS — Z95828 Presence of other vascular implants and grafts: Secondary | ICD-10-CM

## 2023-06-14 DIAGNOSIS — C3492 Malignant neoplasm of unspecified part of left bronchus or lung: Secondary | ICD-10-CM

## 2023-06-14 LAB — CBC WITH DIFFERENTIAL/PLATELET
Abs Immature Granulocytes: 0 10*3/uL (ref 0.00–0.07)
Basophils Absolute: 0 10*3/uL (ref 0.0–0.1)
Basophils Relative: 1 %
Eosinophils Absolute: 0 10*3/uL (ref 0.0–0.5)
Eosinophils Relative: 1 %
HCT: 30 % — ABNORMAL LOW (ref 36.0–46.0)
Hemoglobin: 10 g/dL — ABNORMAL LOW (ref 12.0–15.0)
Immature Granulocytes: 0 %
Lymphocytes Relative: 33 %
Lymphs Abs: 0.6 10*3/uL — ABNORMAL LOW (ref 0.7–4.0)
MCH: 31.3 pg (ref 26.0–34.0)
MCHC: 33.3 g/dL (ref 30.0–36.0)
MCV: 94 fL (ref 80.0–100.0)
Monocytes Absolute: 0.1 10*3/uL (ref 0.1–1.0)
Monocytes Relative: 5 %
Neutro Abs: 1.1 10*3/uL — ABNORMAL LOW (ref 1.7–7.7)
Neutrophils Relative %: 60 %
Platelets: 175 10*3/uL (ref 150–400)
RBC: 3.19 MIL/uL — ABNORMAL LOW (ref 3.87–5.11)
RDW: 21.7 % — ABNORMAL HIGH (ref 11.5–15.5)
WBC: 1.8 10*3/uL — ABNORMAL LOW (ref 4.0–10.5)
nRBC: 0 % (ref 0.0–0.2)

## 2023-06-14 LAB — COMPREHENSIVE METABOLIC PANEL
ALT: 54 U/L — ABNORMAL HIGH (ref 0–44)
AST: 61 U/L — ABNORMAL HIGH (ref 15–41)
Albumin: 3.2 g/dL — ABNORMAL LOW (ref 3.5–5.0)
Alkaline Phosphatase: 172 U/L — ABNORMAL HIGH (ref 38–126)
Anion gap: 7 (ref 5–15)
BUN: 44 mg/dL — ABNORMAL HIGH (ref 8–23)
CO2: 26 mmol/L (ref 22–32)
Calcium: 9.2 mg/dL (ref 8.9–10.3)
Chloride: 100 mmol/L (ref 98–111)
Creatinine, Ser: 0.98 mg/dL (ref 0.44–1.00)
GFR, Estimated: >60 mL/min (ref >60)
Glucose, Bld: 90 mg/dL (ref 70–99)
Potassium: 4.4 mmol/L (ref 3.5–5.1)
Sodium: 133 mmol/L — ABNORMAL LOW (ref 135–145)
Total Bilirubin: 0.6 mg/dL (ref 0.0–1.2)
Total Protein: 7.9 g/dL (ref 6.5–8.1)

## 2023-06-14 LAB — SAMPLE TO BLOOD BANK

## 2023-06-14 MED ORDER — HEPARIN SOD (PORK) LOCK FLUSH 100 UNIT/ML IV SOLN
500.0000 [IU] | Freq: Once | INTRAVENOUS | Status: AC
  Administered 2023-06-14: 500 [IU] via INTRAVENOUS

## 2023-06-14 MED ORDER — SODIUM CHLORIDE 0.9% FLUSH
10.0000 mL | INTRAVENOUS | Status: DC | PRN
  Administered 2023-06-14: 10 mL via INTRAVENOUS

## 2023-06-14 NOTE — Progress Notes (Signed)
 Patients port flushed without difficulty.  Site cleaned with iodine.  Good blood return noted with no bruising or swelling noted at site.  Band aid applied.  VSS with discharge and left in satisfactory condition with no s/s of distress noted.

## 2023-06-15 ENCOUNTER — Telehealth: Payer: Self-pay | Admitting: Cardiology

## 2023-06-15 NOTE — Telephone Encounter (Signed)
 Patient requested samples of medication . Entresto and Eliquis says she is going to run out . Please call patient daughter to give her a update as she has been waiting a couple of days . Thank you

## 2023-06-16 ENCOUNTER — Telehealth: Payer: Self-pay | Admitting: *Deleted

## 2023-06-16 ENCOUNTER — Encounter: Payer: Self-pay | Admitting: Physician Assistant

## 2023-06-16 ENCOUNTER — Other Ambulatory Visit (HOSPITAL_COMMUNITY): Payer: Self-pay

## 2023-06-16 ENCOUNTER — Encounter: Payer: Self-pay | Admitting: Internal Medicine

## 2023-06-16 NOTE — Telephone Encounter (Signed)
 PT states she was emailed a copy of a bill and lost the email. States someone from here sent it to her via email. Does not recall who. Wanted me to resend.Upset we keep tsf her to Billing. I told her emphatically we do not do that but she says she has no reason to lie and wanted me to ask everyone in the office who it was that sent it.  1.) Wants manager to call her  2.) She does not even sound like a 73 year old lady so I do not believe it is her  3.) She gave me a phone # [phone] (This is a # on the FISERV for [employer].  Fwd to [facility]

## 2023-06-19 NOTE — Telephone Encounter (Signed)
 Called and spoke with Clois Dupes (per [facility]) informed that she would need to speak with billing and transferred her to [phone].

## 2023-06-20 ENCOUNTER — Other Ambulatory Visit: Payer: Self-pay | Admitting: Internal Medicine

## 2023-06-20 ENCOUNTER — Telehealth: Payer: Self-pay | Admitting: Medical Oncology

## 2023-06-20 MED ORDER — PANTOPRAZOLE SODIUM 40 MG PO TBEC: 40.0000 mg | DELAYED_RELEASE_TABLET | Freq: Every day | ORAL | 1 refills | Status: AC

## 2023-06-20 NOTE — Telephone Encounter (Signed)
 Refill request for Pantoprozole. Prescribed as inpt in September.

## 2023-06-21 ENCOUNTER — Inpatient Hospital Stay: Payer: Medicare HMO

## 2023-06-21 ENCOUNTER — Encounter: Payer: Self-pay | Admitting: Physician Assistant

## 2023-06-21 ENCOUNTER — Encounter: Payer: Self-pay | Admitting: Internal Medicine

## 2023-06-21 DIAGNOSIS — C3412 Malignant neoplasm of upper lobe, left bronchus or lung: Secondary | ICD-10-CM

## 2023-06-21 DIAGNOSIS — Z5112 Encounter for antineoplastic immunotherapy: Secondary | ICD-10-CM | POA: Diagnosis not present

## 2023-06-21 DIAGNOSIS — R918 Other nonspecific abnormal finding of lung field: Secondary | ICD-10-CM

## 2023-06-21 LAB — CBC WITH DIFFERENTIAL/PLATELET
Abs Immature Granulocytes: 0 10*3/uL (ref 0.00–0.07)
Basophils Absolute: 0 10*3/uL (ref 0.0–0.1)
Basophils Relative: 0 %
Eosinophils Absolute: 0 10*3/uL (ref 0.0–0.5)
Eosinophils Relative: 1 %
HCT: 26.1 % — ABNORMAL LOW (ref 36.0–46.0)
Hemoglobin: 8.6 g/dL — ABNORMAL LOW (ref 12.0–15.0)
Lymphocytes Relative: 41 %
Lymphs Abs: 0.7 10*3/uL (ref 0.7–4.0)
MCH: 31.2 pg (ref 26.0–34.0)
MCHC: 33 g/dL (ref 30.0–36.0)
MCV: 94.6 fL (ref 80.0–100.0)
Monocytes Absolute: 0.2 10*3/uL (ref 0.1–1.0)
Monocytes Relative: 12 %
Neutro Abs: 0.8 10*3/uL — ABNORMAL LOW (ref 1.7–7.7)
Neutrophils Relative %: 46 %
Platelets: 60 10*3/uL — ABNORMAL LOW (ref 150–400)
RBC: 2.76 MIL/uL — ABNORMAL LOW (ref 3.87–5.11)
RDW: 21.2 % — ABNORMAL HIGH (ref 11.5–15.5)
Smear Review: DECREASED
WBC: 1.8 10*3/uL — ABNORMAL LOW (ref 4.0–10.5)
nRBC: 0 % (ref 0.0–0.2)
nRBC: 1 /100{WBCs} — ABNORMAL HIGH

## 2023-06-21 LAB — COMPREHENSIVE METABOLIC PANEL
ALT: 44 U/L (ref 0–44)
AST: 55 U/L — ABNORMAL HIGH (ref 15–41)
Albumin: 3 g/dL — ABNORMAL LOW (ref 3.5–5.0)
Alkaline Phosphatase: 157 U/L — ABNORMAL HIGH (ref 38–126)
Anion gap: 4 — ABNORMAL LOW (ref 5–15)
BUN: 26 mg/dL — ABNORMAL HIGH (ref 8–23)
CO2: 26 mmol/L (ref 22–32)
Calcium: 9.1 mg/dL (ref 8.9–10.3)
Chloride: 108 mmol/L (ref 98–111)
Creatinine, Ser: 0.94 mg/dL (ref 0.44–1.00)
GFR, Estimated: >60 mL/min (ref >60)
Glucose, Bld: 132 mg/dL — ABNORMAL HIGH (ref 70–99)
Potassium: 3.8 mmol/L (ref 3.5–5.1)
Sodium: 138 mmol/L (ref 135–145)
Total Bilirubin: 0.5 mg/dL (ref 0.0–1.2)
Total Protein: 7.2 g/dL (ref 6.5–8.1)

## 2023-06-21 LAB — SAMPLE TO BLOOD BANK

## 2023-06-21 MED ORDER — SODIUM CHLORIDE 0.9% FLUSH
10.0000 mL | INTRAVENOUS | Status: DC | PRN
  Administered 2023-06-21: 10 mL via INTRAVENOUS

## 2023-06-21 MED ORDER — HEPARIN SOD (PORK) LOCK FLUSH 100 UNIT/ML IV SOLN
500.0000 [IU] | Freq: Once | INTRAVENOUS | Status: AC
  Administered 2023-06-21: 500 [IU] via INTRAVENOUS

## 2023-06-21 NOTE — Progress Notes (Signed)
 Bluford Burkitt presented for Portacath access and flush with labs. Portacath located right chest wall accessed with  H 20 needle. Good blood return present. Portacath flushed with 20ml NS and 500U/70ml Heparin  and needle removed intact. No bruising or swelling noted at the site. Procedure tolerated well and without incident.     Discharged from clinic via wheelchair in stable condition. Alert and oriented x 3. F/U with [facility] as scheduled.

## 2023-06-22 MED ORDER — APIXABAN 5 MG PO TABS: 5.0000 mg | ORAL_TABLET | Freq: Two times a day (BID) | ORAL | 0 refills | Status: DC

## 2023-06-22 NOTE — Telephone Encounter (Signed)
Spoke with daughter (patient gave verbal permission when I called her).  Cannot afford Eliquis and Entresto as plan moved from $47/month to 25% copay on these medications.  Will do [employer] for [employer] ID      [id number] BIN             [id number] PCN           [id number] GRP           [id number]  Expires 05/21/2024  No grants available for Eliquis, will leave samples at front desk with information about patient assistance

## 2023-06-24 ENCOUNTER — Encounter: Payer: Self-pay | Admitting: Internal Medicine

## 2023-06-25 ENCOUNTER — Other Ambulatory Visit: Payer: Self-pay

## 2023-06-26 NOTE — Progress Notes (Unsigned)
[facility] OFFICE PROGRESS NOTE  [name], [name] [address] [place] [address]  DIAGNOSIS: Stage IV (T1c, N0, M1b) Non-Small Cell Lung Cancer, adenocarcinoma. She presented with a spiculated left upper lobe lung nodule and large right adrenal gland mass. She was diagnosed in July 2024. She had molecular studies that showed she has KRASG12C which can be used in the second line setting.    PDL1: 1%   Molecular Studies: STK11 but likely not candidate for 4 drug combination and KRASG12C  PRIOR THERAPY: SBRT to the left upper lobe and right adrenal targets under the care of Dr. Mitzi Hansen. Last dose of treatment expected on 02/20/2023   CURRENT THERAPY: ) Systemic chemotherapy with carboplatin for AUC of 5, Alimta 500 Mg/M2 and Keytruda 200 Mg IV every 3 weeks.  First dose April 05, 2023. Status post 4 cycles. Starting from cycle #5, she will start maintenance Alimta and Keytruda.  2) IV iron with Venofer 300 mg weekly PRN. Last dose on 03/21/23.  INTERVAL HISTORY: Erin Pacheco 73 y.o. female returns to the clinic today for a follow up visit accompanied by her daughter.  The patient is currently undergoing chemotherapy and immunotherapy.  Starting from today, she is expected to start maintenance treatment with Alimta and Keytruda.  She tolerated her last cycle of treatment "good".  Her energy is "good".  She denies any fever, chills, or night sweats.  She reports that her appetite is improving and she gained weight since last being seen.  She reports her breathing is "good".   Denies any significant shortness of breath or significant cough.  Denies any chest pain or hemoptysis.  She denies any nausea or vomiting.  She sometimes struggles with constipation intermittently but has laxatives with MiraLAX if needed.  Denies any headache or visual changes.  Denies any rashes or skin changes.  He is compliant with her iron supplement.  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  She is on Eliquis and compliant.  She previously received IV iron.  They are wondering about financial assistance for gas cards to take her to her appointments.  They are also wondering for clarification regarding if her cancer is in "remission". She is here today for evaluation repeat blood work before undergoing cycle #5.   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  MEDICATIONS:  Current Outpatient Medications  Medication Sig Dispense Refill   acetaminophen (TYLENOL) 325 MG tablet Take 2 tablets (650 mg total) by mouth every 6 (six) hours as needed for mild pain (or Fever >/= 101).     apixaban (ELIQUIS) 5 MG TABS tablet Take 1 tablet (5 mg total) by mouth 2 (two) times daily. 60 tablet 5   apixaban (ELIQUIS) 5 MG TABS tablet Take 1 tablet (5 mg total) by mouth 2 (two) times daily. 42 tablet 0   Fluticasone-Umeclidin-Vilant (TRELEGY ELLIPTA) 100-62.5-25 MCG/ACT AEPB Inhale 1 each into the lungs daily.     folic acid (FOLVITE) 1 MG tablet Take 1 tablet (1 mg total) by mouth daily. Start 7 days before pemetrexed chemotherapy. Continue until 21 days after pemetrexed completed.  100 tablet 3   lidocaine-prilocaine (EMLA) cream Apply to the Port-A-Cath site 30 minutes before treatment. 30 g 0   metoprolol succinate (TOPROL-XL) 50 MG 24 hr tablet TAKE 1 TABLET BY MOUTH DAILY. TAKE WITH OR IMMEDIATELY FOLLOWING A MEAL. 60 tablet 5   Multiple Vitamin (MULTIVITAMIN) tablet Take 1 tablet by mouth daily.       ondansetron (ZOFRAN) 8 MG tablet Take 1 tablet (8 mg total) by mouth every 8 (eight) hours as needed for nausea or vomiting. 30 tablet 0   oxyCODONE (OXY IR/ROXICODONE) 5  MG immediate release tablet Take 1 tablet (5 mg total) by mouth every 6 (six) hours as needed for moderate pain. 60 tablet 0   pantoprazole (PROTONIX) 40 MG tablet Take 1 tablet (40 mg total) by mouth daily before breakfast. 30 tablet 1   Polyethyl Glycol-Propyl Glycol (SYSTANE OP) Place 1 drop into both eyes daily as needed (dry eye).     prochlorperazine (COMPAZINE) 10 MG tablet Take 1 tablet (10 mg total) by mouth every 6 (six) hours as needed for nausea or vomiting. 30 tablet 1   sacubitril-valsartan (ENTRESTO) 49-51 MG Take 1 tablet by mouth 2 (two) times daily. 60 tablet 5   spironolactone (ALDACTONE) 25 MG tablet Take 0.5 tablets (12.5 mg total) by mouth daily. 30 tablet 1   No current facility-administered medications for this visit.   Facility-Administered Medications Ordered in Other Visits  Medication Dose Route Frequency Provider Last Rate Last Admin   heparin lock flush 100 unit/mL  500 Units Intracatheter Once Si Gaul, MD       sodium chloride flush (NS) 0.9 % injection 10 mL  10 mL Intracatheter Once Si Gaul, MD        SURGICAL HISTORY:  Past Surgical History:  Procedure Laterality Date   ABDOMINAL HYSTERECTOMY     BILATERAL OOPHORECTOMY     BIOPSY  12/09/2022   Procedure: BIOPSY;  Surgeon: Imogene Burn, MD;  Location: [facility] ENDOSCOPY;  Service: Gastroenterology;;   BRONCHIAL BIOPSY  01/09/2023   Procedure: BRONCHIAL BIOPSIES;  Surgeon: Josephine Igo, DO;  Location: [facility] ENDOSCOPY;  Service: Pulmonary;;   BRONCHIAL NEEDLE ASPIRATION BIOPSY  01/09/2023   Procedure: BRONCHIAL NEEDLE ASPIRATION BIOPSIES;  Surgeon: Josephine Igo, DO;  Location: [facility] ENDOSCOPY;  Service: Pulmonary;;   CATARACT EXTRACTION W/PHACO  12/27/2010   Procedure: CATARACT EXTRACTION PHACO AND INTRAOCULAR LENS PLACEMENT (IOC);  Surgeon: Gemma Payor;  Location: [facility] ORS;  Service: Ophthalmology;  Laterality: Right;   CATARACT EXTRACTION W/PHACO  03/28/2011   Procedure: CATARACT EXTRACTION PHACO  AND INTRAOCULAR LENS PLACEMENT (IOC);  Surgeon: Gemma Payor;  Location: [facility] ORS;  Service: Ophthalmology;  Laterality: Left;  CDE 7.27   ENTEROSCOPY N/A 02/28/2023   Procedure: ENTEROSCOPY;  Surgeon: Iva Boop, MD;  Location: [facility] ENDOSCOPY;  Service: Gastroenterology;  Laterality: N/A;   ESOPHAGOGASTRODUODENOSCOPY (EGD) WITH PROPOFOL N/A 12/09/2022   Procedure: ESOPHAGOGASTRODUODENOSCOPY (EGD) WITH PROPOFOL;  Surgeon: Imogene Burn, MD;  Location: [facility] END[facility]COPY;  Service: Gastroenterology;  Laterality: N/A;   HOT HEMOSTASIS N/A 02/28/2023   Procedure: HOT HEMOSTASIS (ARGON PLASMA COAGULATION/BICAP);  Surgeon: Iva Boop, MD;  Location: [facility] ENDOSCOPY;  Service: Gastroenterology;  Laterality: N/A;   IR IMAGING GUIDED PORT INSERTION  03/23/2023   SUBMUCOSAL TATTOO INJECTION  02/28/2023   Procedure: SUBMUCOSAL TATTOO INJECTION;  Surgeon: Iva Boop, MD;  Location: [facility] ENDOSCOPY;  Service: Gastroenterology;;    REVIEW OF SYSTEMS:   Review of Systems  Constitutional: Negative for appetite  change, chills, fatigue, fever and unexpected weight change.  HENT: Negative for mouth sores, nosebleeds, sore throat and trouble swallowing.   Eyes: Negative for eye problems and icterus.  Respiratory: Negative for cough, hemoptysis, shortness of breath and wheezing.   Cardiovascular: Negative for chest pain and leg swelling.  Gastrointestinal: Positive for mild occasional constipation. Negative for abdominal pain, diarrhea, nausea and vomiting.  Genitourinary: Negative for bladder incontinence, difficulty urinating, dysuria, frequency and hematuria.   Musculoskeletal: Negative for back pain, gait problem, neck pain and neck stiffness.  Skin: Negative for itching and rash.  Neurological: Negative for dizziness, extremity weakness, gait problem, headaches, light-headedness and seizures.  Hematological: Negative for adenopathy. Does not bruise/bleed easily.  Psychiatric/Behavioral: Negative for confusion,  depression and sleep disturbance. The patient is not nervous/anxious.     PHYSICAL EXAMINATION:  There were no vitals taken for this visit.  ECOG PERFORMANCE STATUS: 1  Physical Exam  Constitutional: Oriented to person, place, and time and thin appearing female and, and in no distress. HENT:  Head: Normocephalic and atraumatic.  Mouth/Throat: Oropharynx is clear and moist. No oropharyngeal exudate.  Eyes: Conjunctivae are normal. Right eye exhibits no discharge. Left eye exhibits no discharge. No scleral icterus.  Neck: Normal range of motion. Neck supple.  Cardiovascular: Normal rate, regular rhythm, normal heart sounds and intact distal pulses.   Pulmonary/Chest: Effort normal and breath sounds normal. No respiratory distress. No wheezes. No rales.  Abdominal: Soft. Bowel sounds are normal. Exhibits no distension and no mass. There is no tenderness.  Musculoskeletal: Normal range of motion. Exhibits no edema.  Lymphadenopathy:    No cervical adenopathy.  Neurological: Alert and oriented to person, place, and time. Exhibits muscle wasting. Skin: Skin is warm and dry. No rash noted. Not diaphoretic. No erythema. No pallor.  Psychiatric: Mood, memory and judgment normal.  Vitals reviewed.  LABORATORY DATA: Lab Results  Component Value Date   WBC 1.8 (L) 06/21/2023   HGB 8.6 (L) 06/21/2023   HCT 26.1 (L) 06/21/2023   MCV 94.6 06/21/2023   PLT 60 (L) 06/21/2023      Chemistry      Component Value Date/Time   NA 138 06/21/2023 1452   K 3.8 06/21/2023 1452   CL 108 06/21/2023 1452   CO2 26 06/21/2023 1452   BUN 26 (H) 06/21/2023 1452   CREATININE 0.94 06/21/2023 1452   CREATININE 0.91 06/08/2023 1356      Component Value Date/Time   CALCIUM 9.1 06/21/2023 1452   ALKPHOS 157 (H) 06/21/2023 1452   AST 55 (H) 06/21/2023 1452   AST 38 06/08/2023 1356   ALT 44 06/21/2023 1452   ALT 27 06/08/2023 1356   BILITOT 0.5 06/21/2023 1452   BILITOT 0.3 06/08/2023 1356        RADIOGRAPHIC STUDIES:  CT CHEST ABDOMEN PELVIS W CONTRAST Result Date: 06/06/2023 CLINICAL DATA:  Non-small-cell lung cancer. Assess treatment response. On chemotherapy. Prior XRT. * Tracking Code: BO * EXAM: CT CHEST, ABDOMEN, AND PELVIS WITH CONTRAST TECHNIQUE: Multidetector CT imaging of the chest, abdomen and pelvis was performed following the standard protocol during bolus administration of intravenous contrast. RADIATION DOSE REDUCTION: This exam was performed according to the departmental dose-optimization program which includes automated exposure control, adjustment of the mA and/or kV according to patient size and/or use of iterative reconstruction technique. CONTRAST:  OMNIPAQUE IOHEXOL 300 MG/ML  SOLN COMPARISON:  PET-CT 12/15/2022. Abdomen pelvis CT 01/12/2023. CTA chest 11/16/2022. other prior examinations as well. FINDINGS: CT CHEST  FINDINGS Cardiovascular: Heart is slightly enlarged. Trace pericardial fluid. Coronary artery calcifications are seen. The thoracic aorta has a normal course and caliber with scattered partially calcified atherosclerotic plaque. Plaque also seen along the great vessels. Right IJ chest port in place with tip extending to the SVC right atrial junction region. Mediastinum/Nodes: Small thyroid gland. Slight wall thickening along the course of the esophagus, nonspecific. This has seen previously. There is no specific abnormal lymph node enlargement identified in the axillary regions, hilum or mediastinum. Lungs/Pleura: Areas of cylindrical bronchiectasis identified particularly along the lower lung zones. There are centrilobular emphysematous lung changes identified. Punctate calcified left lower lobe lung nodules identified consistent with old granulomatous disease. There is bandlike changes along the right lower lobe greater than middle lobe. Scarring and atelectatic changes are favored. Slightly more confluence opacity along the dependent right lower lobe,  increased from previous. Spiculated nodule seen along the left suprahilar region is again identified. On previous examination this measured 2.4 x 2.1 cm and today on series 4, image 52 1.7 by 1.3 cm. Associated occlusion of the adjacent bronchus. The opacity seen extending superior and peripheral to the central lesion is slightly increased compared to the prior chest examinations, nonspecific. There are some increasing peripheral areas of interstitial septal thickening in the left upper lobe as well. Some subtle patchy ground-glass such as peripherally left upper lobe on series 4, image 56, nonspecific. Additional small spiculated nodular area along the lingula on series 4, image 89 measures 6 mm. On the prior examination this has more ill-defined ground-glass. More confluence today. Musculoskeletal: Curvature and degenerative changes along the spine. CT ABDOMEN PELVIS FINDINGS Hepatobiliary: Diffuse fatty liver infiltration. Some patchy distribution of fat. Patent portal vein. Gallbladder is nondilated. Pancreas: Unremarkable. No pancreatic ductal dilatation or surrounding inflammatory changes. Spleen: Normal in size without focal abnormality. Adrenals/Urinary Tract: Left adrenal gland is preserved. Low-attenuation mass seen along the right adrenal gland. Previously this has measured at 6.5 x 4.0 cm and today on series 2, image 55 6.4 by 3.9 cm. Similar in size. Margins are less well defined and there is some adjacent stranding. Mild atrophy of the kidneys, left-greater-than-right with benign Bosniak 1/2 right-sided posterior cyst, similar to previous. No enhancing renal mass or collecting system dilatation. The ureters have normal course and caliber down to the bladder. Preserved contours of the urinary bladder. Stomach/Bowel: On this non oral contrast exam stomach is underdistended. Small and large bowel are nondilated as well. Scattered colonic stool. Vascular/Lymphatic: Aortic atherosclerosis. No enlarged  abdominal or pelvic lymph nodes. Reproductive: Status post hysterectomy. No adnexal masses. Other: Anasarca. Retroperitoneal mild stranding identified in the upper retroperitoneum, increased from previous. Musculoskeletal: Curvature of the lumbar spine with retrolisthesis at L4-5 and L5-S1. Advanced degenerative changes along the lower lumbar spine with the associated areas of stenosis at multiple levels from L3 through S1. Degenerative changes of the pelvis. IMPRESSION: Decreasing size left upper lobe suprahilar spiculated mass. No developing pleural effusion or new lymph node enlargement. There is a 6 mm solid nodule in the lingula. Previously this similar in size but less confluence. Attention on follow-up. Similar in size low-attenuation right adrenal mass with increasing adjacent stranding and ill definition. Attention on follow-up. Fatty liver infiltration. Electronically Signed   By: Karen Kays M.D.   On: 06/06/2023 16:30     ASSESSMENT/PLAN:  This is a very pleasant 73 year old female with stage IV (T1c, N0, M1) Non-Small Cell Lung Cancer, adenocarcinoma. She presented with a spiculated left  upper lobe lung nodule and large right adrenal gland mass. She was diagnosed in July 2024. She had molecular studies that showed she has KRASG12C which can be used in the second line setting.  PDL1 1%, she has STK 11.  Dr. Arbutus Ped does not feel that the patient is not a good candidate for the 4 drug combination and her performance status.   She underwent radiation to the adrenal lesion and the lung under the care of Dr. Mitzi Hansen. The last dose was on 02/20/23   She is currently on palliative chemotherapy with carboplatin for an AUC of 5, Alimta 500 mg/m2 and immunotherapy with Keytruda 200 mg IV every 3 weeks. First dose on 04/05/23 which she tolerated well except for neutropenia for which she receives zarxio as needed. She is status post 4 cycles.  Starting from cycle #5, she will start maintenance Alimta and  Keytruda.   Labs were reviewed.    Recommend she proceed with cycle #5 today as scheduled.   She will continue her iron supplement and blood thinner.    Of note, she is allergic to chloraprep.   Will see her back for labs and follow-up visit in 3 weeks for evaluation repeat blood work before undergoing cycle #6.  I reviewed the indication for her current treatment.  Discussed that we will be arranging for scans every 3-4 cycles of treatment and as long as everything is stable we will continue on her current treatment.  Immunotherapy is approved for up to 2 years unless evidence of disease progression or unacceptable toxicity.  We will check with the financial advocates today about getting the patient and her daughter gas card.  The patient was advised to call immediately if he has any concerning symptoms in the interval. The patient voices understanding of current disease status and treatment options and is in agreement with the current care plan. All questions were answered. The patient knows to call the clinic with any problems, questions or concerns. We can certainly see the patient much sooner if necessary   No orders of the defined types were placed in this encounter.   The total time spent in the appointment was 20-29 minutes  Ruthie Berch L Cristopher Ciccarelli, [name] 06/26/23

## 2023-06-29 ENCOUNTER — Encounter: Payer: Self-pay | Admitting: Internal Medicine

## 2023-06-29 ENCOUNTER — Inpatient Hospital Stay: Payer: Medicare HMO

## 2023-06-29 ENCOUNTER — Ambulatory Visit: Payer: Medicare HMO | Admitting: Cardiology

## 2023-06-29 ENCOUNTER — Inpatient Hospital Stay: Payer: Medicare HMO | Admitting: Physician Assistant

## 2023-06-29 VITALS — BP 128/64 | HR 68 | Temp 97.8°F | Resp 17 | Wt 132.8 lb

## 2023-06-29 DIAGNOSIS — R918 Other nonspecific abnormal finding of lung field: Secondary | ICD-10-CM

## 2023-06-29 DIAGNOSIS — C3492 Malignant neoplasm of unspecified part of left bronchus or lung: Secondary | ICD-10-CM

## 2023-06-29 DIAGNOSIS — Z5112 Encounter for antineoplastic immunotherapy: Secondary | ICD-10-CM | POA: Diagnosis not present

## 2023-06-29 DIAGNOSIS — C3412 Malignant neoplasm of upper lobe, left bronchus or lung: Secondary | ICD-10-CM | POA: Diagnosis not present

## 2023-06-29 DIAGNOSIS — Z95828 Presence of other vascular implants and grafts: Secondary | ICD-10-CM

## 2023-06-29 DIAGNOSIS — D701 Agranulocytosis secondary to cancer chemotherapy: Secondary | ICD-10-CM

## 2023-06-29 LAB — CMP (CANCER CENTER ONLY)
ALT: 33 U/L (ref 0–44)
AST: 41 U/L (ref 15–41)
Albumin: 3.3 g/dL — ABNORMAL LOW (ref 3.5–5.0)
Alkaline Phosphatase: 158 U/L — ABNORMAL HIGH (ref 38–126)
Anion gap: 6 (ref 5–15)
BUN: 27 mg/dL — ABNORMAL HIGH (ref 8–23)
CO2: 29 mmol/L (ref 22–32)
Calcium: 9.3 mg/dL (ref 8.9–10.3)
Chloride: 102 mmol/L (ref 98–111)
Creatinine: 1.02 mg/dL — ABNORMAL HIGH (ref 0.44–1.00)
GFR, Estimated: 58 mL/min — ABNORMAL LOW (ref >60)
Glucose, Bld: 155 mg/dL — ABNORMAL HIGH (ref 70–99)
Potassium: 4.2 mmol/L (ref 3.5–5.1)
Sodium: 137 mmol/L (ref 135–145)
Total Bilirubin: 0.3 mg/dL (ref 0.0–1.2)
Total Protein: 7.1 g/dL (ref 6.5–8.1)

## 2023-06-29 LAB — CBC WITH DIFFERENTIAL (CANCER CENTER ONLY)
Abs Immature Granulocytes: 0.02 10*3/uL (ref 0.00–0.07)
Basophils Absolute: 0 10*3/uL (ref 0.0–0.1)
Basophils Relative: 1 %
Eosinophils Absolute: 0 10*3/uL (ref 0.0–0.5)
Eosinophils Relative: 1 %
HCT: 27.4 % — ABNORMAL LOW (ref 36.0–46.0)
Hemoglobin: 9.2 g/dL — ABNORMAL LOW (ref 12.0–15.0)
Immature Granulocytes: 1 %
Lymphocytes Relative: 25 %
Lymphs Abs: 0.7 10*3/uL (ref 0.7–4.0)
MCH: 31.5 pg (ref 26.0–34.0)
MCHC: 33.6 g/dL (ref 30.0–36.0)
MCV: 93.8 fL (ref 80.0–100.0)
Monocytes Absolute: 0.6 10*3/uL (ref 0.1–1.0)
Monocytes Relative: 19 %
Neutro Abs: 1.6 10*3/uL — ABNORMAL LOW (ref 1.7–7.7)
Neutrophils Relative %: 53 %
Platelet Count: 126 10*3/uL — ABNORMAL LOW (ref 150–400)
RBC: 2.92 MIL/uL — ABNORMAL LOW (ref 3.87–5.11)
RDW: 22.4 % — ABNORMAL HIGH (ref 11.5–15.5)
WBC Count: 2.9 10*3/uL — ABNORMAL LOW (ref 4.0–10.5)
nRBC: 1.4 % — ABNORMAL HIGH (ref 0.0–0.2)

## 2023-06-29 LAB — SAMPLE TO BLOOD BANK

## 2023-06-29 MED ORDER — SODIUM CHLORIDE 0.9 % IV SOLN
500.0000 mg/m2 | Freq: Once | INTRAVENOUS | Status: AC
  Administered 2023-06-29: 800 mg via INTRAVENOUS
  Filled 2023-06-29: qty 20

## 2023-06-29 MED ORDER — HEPARIN SOD (PORK) LOCK FLUSH 100 UNIT/ML IV SOLN
500.0000 [IU] | Freq: Once | INTRAVENOUS | Status: AC | PRN
  Administered 2023-06-29: 500 [IU]

## 2023-06-29 MED ORDER — SODIUM CHLORIDE 0.9 % IV SOLN
200.0000 mg | Freq: Once | INTRAVENOUS | Status: AC
  Administered 2023-06-29: 200 mg via INTRAVENOUS
  Filled 2023-06-29: qty 200

## 2023-06-29 MED ORDER — SODIUM CHLORIDE 0.9% FLUSH
10.0000 mL | INTRAVENOUS | Status: DC | PRN
  Administered 2023-06-29: 10 mL

## 2023-06-29 MED ORDER — SODIUM CHLORIDE 0.9% FLUSH: 10.0000 mL | Freq: Once | INTRAVENOUS | Status: DC

## 2023-06-29 MED ORDER — SODIUM CHLORIDE 0.9 % IV SOLN: Freq: Once | INTRAVENOUS | Status: AC

## 2023-06-29 MED ORDER — PROCHLORPERAZINE MALEATE 10 MG PO TABS
10.0000 mg | ORAL_TABLET | Freq: Once | ORAL | Status: AC
  Administered 2023-06-29: 10 mg via ORAL
  Filled 2023-06-29: qty 1

## 2023-06-29 NOTE — Patient Instructions (Signed)
 [facility] - A DEPT OF [facility]  Discharge Instructions: Thank you for choosing [facility] to provide your oncology and hematology care.   If you have a lab appointment with the Cancer Center, please go directly to the Cancer Center and check in at the registration area.   Wear comfortable clothing and clothing appropriate for easy access to any Portacath or PICC line.   We strive to give you quality time with your provider. You may need to reschedule your appointment if you arrive late (15 or more minutes).  Arriving late affects you and other patients whose appointments are after yours.  Also, if you miss three or more appointments without notifying the office, you may be dismissed from the clinic at the provider's discretion.      For prescription refill requests, have your pharmacy contact our office and allow 72 hours for refills to be completed.    Today you received the following chemotherapy and/or immunotherapy agents: Keytruda, Alimta      To help prevent nausea and vomiting after your treatment, we encourage you to take your nausea medication as directed.  BELOW ARE SYMPTOMS THAT SHOULD BE REPORTED IMMEDIATELY: *FEVER GREATER THAN 100.4 F (38 C) OR HIGHER *CHILLS OR SWEATING *NAUSEA AND VOMITING THAT IS NOT CONTROLLED WITH YOUR NAUSEA MEDICATION *UNUSUAL SHORTNESS OF BREATH *UNUSUAL BRUISING OR BLEEDING *URINARY PROBLEMS (pain or burning when urinating, or frequent urination) *BOWEL PROBLEMS (unusual diarrhea, constipation, pain near the anus) TENDERNESS IN MOUTH AND THROAT WITH OR WITHOUT PRESENCE OF ULCERS (sore throat, sores in mouth, or a toothache) UNUSUAL RASH, SWELLING OR PAIN  UNUSUAL VAGINAL DISCHARGE OR ITCHING   Items with * indicate a potential emergency and should be followed up as soon as possible or go to the Emergency Department if any problems should occur.  Please show the CHEMOTHERAPY ALERT CARD or  IMMUNOTHERAPY ALERT CARD at check-in to the Emergency Department and triage nurse.  Should you have questions after your visit or need to cancel or reschedule your appointment, please contact [facility] - A DEPT OF [facility]  Dept: [phone]  and follow the prompts.  Office hours are 8:00 a.m. to 4:30 p.m. Monday - Friday. Please note that voicemails left after 4:00 p.m. may not be returned until the following business day.  We are closed weekends and major holidays. You have access to a nurse at all times for urgent questions. Please call the main number to the clinic Dept: [phone] and follow the prompts.   For any non-urgent questions, you may also contact your provider using MyChart. We now offer e-Visits for anyone 79 and older to request care online for non-urgent symptoms. For details visit [email or web address].   Also download the MyChart app! Go to the app store, search "MyChart", open the app, select [facility], and log in with your MyChart username and password.

## 2023-06-29 NOTE — Progress Notes (Signed)
Received call from [name] pt's daughter per my request after asking about gas cards.  Reminded her of our conversation back in November and what was needed in order for patient to qualify for [employer]. She states they had not applied for Medicaid or Food stamps as of yet but did submit Marijean Niemann application and they are assisting patient.  She has my card to contact me should patient become eligible for food stamps so that she may apply for grant. She verbalized understanding.

## 2023-07-03 ENCOUNTER — Other Ambulatory Visit: Payer: Self-pay | Admitting: Internal Medicine

## 2023-07-06 ENCOUNTER — Other Ambulatory Visit: Payer: Self-pay

## 2023-07-07 ENCOUNTER — Ambulatory Visit: Payer: Medicare HMO | Admitting: Cardiology

## 2023-07-11 ENCOUNTER — Ambulatory Visit: Payer: Medicare HMO | Attending: Physician Assistant | Admitting: Physician Assistant

## 2023-07-11 ENCOUNTER — Encounter: Payer: Self-pay | Admitting: Physician Assistant

## 2023-07-11 VITALS — BP 110/56 | HR 66 | Ht 63.0 in | Wt 131.8 lb

## 2023-07-11 DIAGNOSIS — D649 Anemia, unspecified: Secondary | ICD-10-CM | POA: Diagnosis not present

## 2023-07-11 DIAGNOSIS — I10 Essential (primary) hypertension: Secondary | ICD-10-CM

## 2023-07-11 DIAGNOSIS — C3492 Malignant neoplasm of unspecified part of left bronchus or lung: Secondary | ICD-10-CM

## 2023-07-11 DIAGNOSIS — I519 Heart disease, unspecified: Secondary | ICD-10-CM | POA: Diagnosis not present

## 2023-07-11 DIAGNOSIS — I5020 Unspecified systolic (congestive) heart failure: Secondary | ICD-10-CM

## 2023-07-11 DIAGNOSIS — I2694 Multiple subsegmental pulmonary emboli without acute cor pulmonale: Secondary | ICD-10-CM

## 2023-07-11 MED ORDER — APIXABAN 2.5 MG PO TABS: 5.0000 mg | ORAL_TABLET | Freq: Two times a day (BID) | ORAL | 0 refills | Status: DC

## 2023-07-11 NOTE — Progress Notes (Signed)
 Cardiology Office Note:  .   Date:  07/11/2023  ID:  Erin Pacheco, DOB [birth year], MRN [id number] PCP: Emilio Joesph VEAR DEVONNA   HeartCare Providers Cardiologist:  Lynwood Schilling, MD     History of Present Illness: .   Erin Pacheco is a 73 y.o. 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  Patient was first seen by cardiology service on 11/17/2022 during hospital admission for persistent dry cough.  Serial troponin was negative x 2.  D-dimer was elevated.  A CTA showed segmental PE in the right upper lobe greater than left lower lobe, small amount of clot burden, emphysematous lung changes with spiculated left upper lobe lung mass measuring 2.5 cm, there was also right adrenal mass measuring 4.5 x 2.9 cm.  Image was concerning for malignancy.  Patient was subsequently transferred to [facility] for further workup.  Echocardiogram obtained on 11/16/2022 showed EF 30 to 35%, global hypokinesis, normal RV, small circumferential pericardial effusion, mild LAE, mild to moderate AI.  Patient was seen by Dr. Schilling, she was placed on Toprol -XL, ARB and spironolactone .  After adrenal biopsy, she was started on Eliquis .  Lower extremity venous Doppler was negative for DVT.  MRI of the brain was negative for metastasis or acute intracranial abnormality.  Bone scan was negative for metastasis.  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  MRCP revealed large right adrenal mass with evidence of hemorrhage.  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   I saw the patient in  July 2020 for along with her daughter.  She denied any significant chest pain or shortness of breath.  She was having right flank pain.  She was euvolemic on exam.  I increased her losartan  to 50 mg daily.  Limited echocardiogram obtained on 02/15/2023 showed EF remain low at 30 to 35%, grade 2 DD, left bundle branch block with abnormal paradoxical septal motion, trivial MR.  I discontinued her losartan  and transition her to Entresto  49-51 mg twice a day.  She was started on chemotherapy in October 2024.  She presented to the ED in September 2024 with anemia, fecal occult blood was positive.  She underwent a small bowel enteroscopy that showed multiple nonbleeding angiodysplastic lesions.  She was restarted on Eliquis .  Spironolactone  was discontinued in the setting of hypotension.  This was being monitored by oncology service.  She was seen by Katlin West NP in October 2024, family deferred additional escalation of the GDMT.  Patient presents today for follow-up.  She denies any chest pain or significant worsening shortness of breath.  She has no lower extremity edema, her lung is clear on exam.  She is receiving chemotherapy via Port-A-Cath.  I recommended repeat echocardiogram in the mid March.  She is really not a great candidate for invasive ischemic workup.  If EF remain low, I would recommend consider medical management given lack of chest pain.  She can follow-up with Dr. Schilling in 3 to 4 months.  ROS:   She denies chest pain, palpitations, dyspnea, pnd, orthopnea, n, v, dizziness, syncope, edema, weight gain, or early satiety. All other systems reviewed and are otherwise negative  except as noted above.    Studies Reviewed: .        Cardiac Studies & Procedures      ECHOCARDIOGRAM  ECHOCARDIOGRAM LIMITED 02/15/2023  Narrative ECHOCARDIOGRAM LIMITED REPORT    Patient Name:   Erin Pacheco Date of Exam: 02/15/2023 Medical Rec #:  [id number]        Height:       62.0 in Accession #:     [id number]       Weight:       127.2 lb Date of Birth:  03-19-1951        BSA:          1.577 m Patient Age:    71 years         BP:           120/60 mmHg Patient Gender: F                HR:           69 bpm. Exam Location:  [address]  Procedure: Limited Echo, Cardiac Doppler, Limited Color Doppler and Intracardiac Opacification Agent  Indications:    I50.21 Acute systolic (congestive) heart failure  History:        Patient has prior history of Echocardiogram examinations, most recent 11/23/2022. Cardiomyopathy, Arrythmias:LBBB; Risk Factors:Hypertension. Lung cancer. Pulmonary emboli.  Sonographer:    Jon Hacker RCS Referring Phys: Kipp Shank  IMPRESSIONS   1. Left ventricular ejection fraction, by estimation, is 30 to 35%. The left ventricle has moderately decreased function. The left ventricle demonstrates regional wall motion abnormalities. Abnormal (paradoxical) septal motion, consistent with left bundle branch block. There is mild left ventricular hypertrophy. Left ventricular diastolic parameters are consistent with Grade II diastolic dysfunction (pseudonormalization). Elevated left atrial pressure. 2. Right ventricular systolic function is normal. The right ventricular size is normal. Tricuspid regurgitation signal is inadequate for assessing PA pressure. 3. Left atrial size was moderately dilated. 4. The mitral valve is normal in structure. Trivial mitral valve regurgitation. 5. The aortic valve is tricuspid. Aortic valve regurgitation is mild to moderate. Aortic valve sclerosis is present, with no evidence of aortic valve stenosis. 6. The inferior vena cava is normal in size with greater than 50% respiratory variability, suggesting right atrial pressure of 3 mmHg.  FINDINGS Left Ventricle: Left ventricular ejection fraction, by estimation, is 30 to 35%. The left ventricle has moderately decreased function. The left ventricle demonstrates regional wall motion  abnormalities. The left ventricular internal cavity size was normal in size. There is mild left ventricular hypertrophy. Abnormal (paradoxical) septal motion, consistent with left bundle branch block. Left ventricular diastolic parameters are consistent with Grade II diastolic dysfunction (pseudonormalization). Elevated left atrial pressure.  Right Ventricle: The right ventricular size is normal. No increase in right ventricular wall thickness. Right ventricular systolic function is normal. Tricuspid regurgitation signal is inadequate for assessing PA pressure.  Left Atrium: Left atrial size was moderately dilated.  Pericardium: Trivial pericardial effusion is present.  Mitral Valve: The mitral valve is normal in structure. Trivial mitral valve regurgitation.  Aortic Valve: The aortic valve is tricuspid. Aortic valve regurgitation is mild to moderate. Aortic valve sclerosis is present, with no evidence of aortic valve stenosis.  Pulmonic Valve: The pulmonic valve was not well visualized. Pulmonic valve regurgitation is not visualized.  Aorta: The aortic root and ascending aorta are structurally normal, with no evidence of dilitation.  Venous: The inferior vena cava is normal in size with greater than 50%  respiratory variability, suggesting right atrial pressure of 3 mmHg.  IAS/Shunts: The interatrial septum was not well visualized.  LEFT VENTRICLE PLAX 2D LVIDd:         4.70 cm   Diastology LVIDs:         4.10 cm   LV e' medial:    5.00 cm/s LV PW:         1.10 cm   LV E/e' medial:  19.2 LV IVS:        1.00 cm   LV e' lateral:   7.29 cm/s LVOT diam:     2.00 cm   LV E/e' lateral: 13.2 LV SV:         61 LV SV Index:   39 LVOT Area:     3.14 cm   LEFT ATRIUM             Index LA diam:        3.20 cm 2.03 cm/m LA Vol (A2C):   81.3 ml 51.55 ml/m LA Vol (A4C):   56.5 ml 35.82 ml/m LA Biplane Vol: 68.0 ml 43.11 ml/m AORTIC VALVE LVOT Vmax:   96.50 cm/s LVOT Vmean:  61.500  cm/s LVOT VTI:    0.194 m  AORTA Ao Root diam: 3.00 cm Ao Asc diam:  3.10 cm  MITRAL VALVE MV Area (PHT): 3.58 cm     SHUNTS MV Decel Time: 212 msec     Systemic VTI:  0.19 m MV E velocity: 95.90 cm/s   Systemic Diam: 2.00 cm MV A velocity: 130.00 cm/s MV E/A ratio:  0.74  Lonni Nanas MD Electronically signed by Lonni Nanas MD Signature Date/Time: 02/15/2023/2:00:59 PM    Final             Risk Assessment/Calculations:             Physical Exam:   VS:  BP (!) 110/56 (BP Location: Right Arm, Patient Position: Sitting, Cuff Size: Normal)   Pulse 66   Ht 5' 3 (1.6 m)   Wt 131 lb 12.8 oz (59.8 kg)   SpO2 92%   BMI 23.35 kg/m    Wt Readings from Last 3 Encounters:  07/11/23 131 lb 12.8 oz (59.8 kg)  06/29/23 132 lb 12.8 oz (60.2 kg)  06/08/23 127 lb 4.8 oz (57.7 kg)    GEN: Well nourished, well developed in no acute distress NECK: No JVD; No carotid bruits CARDIAC: RRR, no murmurs, rubs, gallops RESPIRATORY:  Clear to auscultation without rales, wheezing or rhonchi  ABDOMEN: Soft, non-tender, non-distended EXTREMITIES:  No edema; No deformity   ASSESSMENT AND PLAN: .      LV dysfunction  / Reduced Ejection Fraction (EF 30-35%) On Entresto  49-51mg  twice daily. No chest pain or worsening shortness of breath reported. -Repeat echocardiogram in mid-March to assess for improvement in EF. -Consider further workup depending on echocardiogram results and patient's overall clinical status.  Note, patient is very frail.  Ischemic workup may not change her overall treatment plan.  Anemia Hemoglobin 9.2, not severe enough for transfusion. Taking iron  supplement once daily. -Continue iron  supplement once daily. -Monitor for worsening fatigue and check with oncologist if symptoms worsen.  Anticoagulation for History of Pulmonary Embolism (PE) On Eliquis . No bleeding issues reported. -Continue Eliquis  as prescribed.  Hypertension: Blood pressure  stable  Stage 4 Non-Small Cell Lung Cancer with Adrenal Metastasis Undergoing chemotherapy. No new symptoms reported. -Continue current chemotherapy regimen. -Monitor for worsening fatigue due to anemia.  Follow-up Schedule appointment  in 3-4 months with Dr. Lavaughn.       Dispo: Follow-up in 3 to 4 months  Signed, Jordyan Hardiman, [place]

## 2023-07-11 NOTE — Patient Instructions (Signed)
Medication Instructions:  NO CHANGES *If you need a refill on your cardiac medications before your next appointment, please call your pharmacy*   Lab Work: NO LABS If you have labs (blood work) drawn today and your tests are completely normal, you will receive your results only by: MyChart Message (if you have MyChart) OR A paper copy in the mail If you have any lab test that is abnormal or we need to change your treatment, we will call you to review the results.   Testing/Procedures:[address]- IN MID MARCH 2025 Your physician has requested that you have an echocardiogram. Echocardiography is a painless test that uses sound waves to create images of your heart. It provides your doctor with information about the size and shape of your heart and how well your heart's chambers and valves are working. This procedure takes approximately one hour. There are no restrictions for this procedure. Please do NOT wear cologne, perfume, aftershave, or lotions (deodorant is allowed). Please arrive 15 minutes prior to your appointment time.  Please note: We ask at that you not bring children with you during ultrasound (echo/ vascular) testing. Due to room size and safety concerns, children are not allowed in the ultrasound rooms during exams. Our front office staff cannot provide observation of children in our lobby area while testing is being conducted. An adult accompanying a patient to their appointment will only be allowed in the ultrasound room at the discretion of the ultrasound technician under special circumstances. We apologize for any inconvenience.    Follow-Up: At [facility], you and your health needs are our priority.  As part of our continuing mission to provide you with exceptional heart care, we have created designated Provider Care Teams.  These Care Teams include your primary Cardiologist (physician) and Advanced Practice Providers (APPs -  Physician Assistants and  Nurse Practitioners) who all work together to provide you with the care you need, when you need it.   Your next appointment:   3-4 month(s)  Provider:   Rollene Rotunda, MD    Other Instructions

## 2023-07-12 ENCOUNTER — Other Ambulatory Visit: Payer: Self-pay

## 2023-07-20 ENCOUNTER — Inpatient Hospital Stay: Payer: Medicare HMO | Attending: Physician Assistant

## 2023-07-20 ENCOUNTER — Inpatient Hospital Stay (HOSPITAL_BASED_OUTPATIENT_CLINIC_OR_DEPARTMENT_OTHER): Payer: Medicare HMO | Admitting: Internal Medicine

## 2023-07-20 ENCOUNTER — Inpatient Hospital Stay: Payer: Medicare HMO

## 2023-07-20 VITALS — BP 121/57 | HR 68 | Temp 98.2°F | Resp 15 | Wt 132.6 lb

## 2023-07-20 VITALS — BP 114/57 | HR 71 | Temp 98.4°F | Resp 16

## 2023-07-20 DIAGNOSIS — C792 Secondary malignant neoplasm of skin: Secondary | ICD-10-CM | POA: Insufficient documentation

## 2023-07-20 DIAGNOSIS — C3490 Malignant neoplasm of unspecified part of unspecified bronchus or lung: Secondary | ICD-10-CM

## 2023-07-20 DIAGNOSIS — R918 Other nonspecific abnormal finding of lung field: Secondary | ICD-10-CM

## 2023-07-20 DIAGNOSIS — Z5112 Encounter for antineoplastic immunotherapy: Secondary | ICD-10-CM | POA: Insufficient documentation

## 2023-07-20 DIAGNOSIS — T50995D Adverse effect of other drugs, medicaments and biological substances, subsequent encounter: Secondary | ICD-10-CM | POA: Diagnosis not present

## 2023-07-20 DIAGNOSIS — C7971 Secondary malignant neoplasm of right adrenal gland: Secondary | ICD-10-CM | POA: Insufficient documentation

## 2023-07-20 DIAGNOSIS — D500 Iron deficiency anemia secondary to blood loss (chronic): Secondary | ICD-10-CM

## 2023-07-20 DIAGNOSIS — Z5111 Encounter for antineoplastic chemotherapy: Secondary | ICD-10-CM | POA: Insufficient documentation

## 2023-07-20 DIAGNOSIS — Z95828 Presence of other vascular implants and grafts: Secondary | ICD-10-CM

## 2023-07-20 DIAGNOSIS — C3412 Malignant neoplasm of upper lobe, left bronchus or lung: Secondary | ICD-10-CM

## 2023-07-20 DIAGNOSIS — Z79620 Long term (current) use of immunosuppressive biologic: Secondary | ICD-10-CM | POA: Insufficient documentation

## 2023-07-20 DIAGNOSIS — T451X5A Adverse effect of antineoplastic and immunosuppressive drugs, initial encounter: Secondary | ICD-10-CM

## 2023-07-20 LAB — CBC WITH DIFFERENTIAL (CANCER CENTER ONLY)
Abs Immature Granulocytes: 0.02 10*3/uL (ref 0.00–0.07)
Basophils Absolute: 0 10*3/uL (ref 0.0–0.1)
Basophils Relative: 1 %
Eosinophils Absolute: 0 10*3/uL (ref 0.0–0.5)
Eosinophils Relative: 1 %
HCT: 28.6 % — ABNORMAL LOW (ref 36.0–46.0)
Hemoglobin: 9.4 g/dL — ABNORMAL LOW (ref 12.0–15.0)
Immature Granulocytes: 1 %
Lymphocytes Relative: 28 %
Lymphs Abs: 1 10*3/uL (ref 0.7–4.0)
MCH: 32.2 pg (ref 26.0–34.0)
MCHC: 32.9 g/dL (ref 30.0–36.0)
MCV: 97.9 fL (ref 80.0–100.0)
Monocytes Absolute: 0.9 10*3/uL (ref 0.1–1.0)
Monocytes Relative: 23 %
Neutro Abs: 1.7 10*3/uL (ref 1.7–7.7)
Neutrophils Relative %: 46 %
Platelet Count: 313 10*3/uL (ref 150–400)
RBC: 2.92 MIL/uL — ABNORMAL LOW (ref 3.87–5.11)
RDW: 20.2 % — ABNORMAL HIGH (ref 11.5–15.5)
WBC Count: 3.7 10*3/uL — ABNORMAL LOW (ref 4.0–10.5)
nRBC: 0 % (ref 0.0–0.2)

## 2023-07-20 LAB — CMP (CANCER CENTER ONLY)
ALT: 33 U/L (ref 0–44)
AST: 44 U/L — ABNORMAL HIGH (ref 15–41)
Albumin: 3.3 g/dL — ABNORMAL LOW (ref 3.5–5.0)
Alkaline Phosphatase: 167 U/L — ABNORMAL HIGH (ref 38–126)
Anion gap: 2 — ABNORMAL LOW (ref 5–15)
BUN: 21 mg/dL (ref 8–23)
CO2: 30 mmol/L (ref 22–32)
Calcium: 9.2 mg/dL (ref 8.9–10.3)
Chloride: 105 mmol/L (ref 98–111)
Creatinine: 1.04 mg/dL — ABNORMAL HIGH (ref 0.44–1.00)
GFR, Estimated: 57 mL/min — ABNORMAL LOW (ref >60)
Glucose, Bld: 91 mg/dL (ref 70–99)
Potassium: 4.3 mmol/L (ref 3.5–5.1)
Sodium: 137 mmol/L (ref 135–145)
Total Bilirubin: 0.3 mg/dL (ref 0.0–1.2)
Total Protein: 7.5 g/dL (ref 6.5–8.1)

## 2023-07-20 LAB — TYPE AND SCREEN
ABO/RH(D): A POS
Antibody Screen: NEGATIVE

## 2023-07-20 LAB — TSH: TSH: 1.267 u[IU]/mL (ref 0.350–4.500)

## 2023-07-20 LAB — SAMPLE TO BLOOD BANK

## 2023-07-20 MED ORDER — HEPARIN SOD (PORK) LOCK FLUSH 100 UNIT/ML IV SOLN
500.0000 [IU] | Freq: Once | INTRAVENOUS | Status: AC | PRN
  Administered 2023-07-20: 500 [IU]

## 2023-07-20 MED ORDER — SODIUM CHLORIDE 0.9 % IV SOLN
500.0000 mg/m2 | Freq: Once | INTRAVENOUS | Status: AC
Start: 2023-07-20 — End: 2023-07-20
  Administered 2023-07-20: 800 mg via INTRAVENOUS
  Filled 2023-07-20: qty 20

## 2023-07-20 MED ORDER — CYANOCOBALAMIN 1000 MCG/ML IJ SOLN
1000.0000 ug | Freq: Once | INTRAMUSCULAR | Status: AC
  Administered 2023-07-20: 1000 ug via INTRAMUSCULAR
  Filled 2023-07-20: qty 1

## 2023-07-20 MED ORDER — SODIUM CHLORIDE 0.9 % IV SOLN
200.0000 mg | Freq: Once | INTRAVENOUS | Status: AC
  Administered 2023-07-20: 200 mg via INTRAVENOUS
  Filled 2023-07-20: qty 200

## 2023-07-20 MED ORDER — SODIUM CHLORIDE 0.9 % IV SOLN: Freq: Once | INTRAVENOUS | Status: AC

## 2023-07-20 MED ORDER — SODIUM CHLORIDE 0.9% FLUSH
10.0000 mL | Freq: Once | INTRAVENOUS | Status: AC
  Administered 2023-07-20: 10 mL

## 2023-07-20 MED ORDER — PROCHLORPERAZINE MALEATE 10 MG PO TABS
10.0000 mg | ORAL_TABLET | Freq: Once | ORAL | Status: AC
  Administered 2023-07-20: 10 mg via ORAL
  Filled 2023-07-20: qty 1

## 2023-07-20 MED ORDER — SODIUM CHLORIDE 0.9% FLUSH
10.0000 mL | INTRAVENOUS | Status: DC | PRN
  Administered 2023-07-20: 10 mL

## 2023-07-20 NOTE — Progress Notes (Signed)
[facility] [phone]   [phone]  OFFICE PROGRESS NOTE  Erin Canary, [name] [address] [place] [address]  DIAGNOSIS:  Stage IV (T1c, N0, M1b) Non-Small Cell Lung Cancer, adenocarcinoma. She presented with a spiculated left upper lobe lung nodule and large right adrenal gland mass. She was diagnosed in July 2024. She had molecular studies that showed she has KRASG12C which can be used in the second line setting.    PDL1: 1%   PRIOR THERAPY: SBRT to the left upper lobe and right adrenal targets under the care of Dr. Mitzi Hansen.  Last dose of treatment expected on 02/20/2023   CURRENT THERAPY: Systemic chemotherapy with carboplatin for AUC of 5, Alimta 500 Mg/M2 and Keytruda 200 Mg IV every 3 weeks.  First dose April 05, 2023.  Status post 5 cycles  INTERVAL HISTORY: Erin Pacheco 73 y.o. female returns to the clinic today for follow-up visit accompanied by her daughter.Discussed the use of AI scribe software for clinical note transcription with the patient, who gave verbal consent to proceed.  History of Present Illness   Erin Pacheco is a 73 year old female with stage four non-small cell lung cancer adenocarcinoma who presents for follow-up of her cancer treatment. She is accompanied by her daughter.  Diagnosed with stage four non-small cell lung cancer adenocarcinoma in July 2024, she has a KRAS G12C mutation. Initially, she received radiation therapy for upper skin metastasis and underwent four cycles of chemotherapy with carboplatin, pemetrexed (Alimta), and pembrolizumab (Keytruda). Recently, she transitioned to a clinical trial regimen of pemetrexed and pembrolizumab, which she has been on for three weeks.  She feels 'okay' over the past three weeks with no new complaints such as chest pain, increased shortness of breath, nausea, or vomiting. No weight loss is noted. She experiences occasional dizziness when changing  positions quickly, attributing it to standing up too fast.  This is her sixth cycle of treatment. A scan is scheduled in two weeks to assess her response to the treatment.       MEDICAL HISTORY: Past Medical History:  Diagnosis Date   Arthritis    Cancer [facility])    right adrenal adenocarcinoma 11/23/22   Cardiomyopathy (HCC)    COPD (chronic obstructive pulmonary disease) (HCC)    Hypertension    Pulmonary embolism (HCC) 11/16/2022   Tobacco abuse     ALLERGIES:  is allergic to chlorhexidine.  MEDICATIONS:  Current Outpatient Medications  Medication Sig Dispense Refill   acetaminophen (TYLENOL) 325 MG tablet Take 2 tablets (650 mg total) by mouth every 6 (six) hours as needed for mild pain (or Fever >/= 101).     apixaban (ELIQUIS) 2.5 MG TABS tablet Take 2 tablets (5 mg total) by mouth 2 (two) times daily. 56 tablet 0   apixaban (ELIQUIS) 5 MG TABS tablet Take 1 tablet (5 mg total) by mouth 2 (two) times daily. 60 tablet 5   Fluticasone-Umeclidin-Vilant (TRELEGY ELLIPTA) 100-62.5-25 MCG/ACT AEPB Inhale 1 each into the lungs daily.     folic acid (FOLVITE) 1 MG tablet Take 1 tablet (1 mg total) by mouth daily. Start 7 days before pemetrexed chemotherapy. Continue until 21 days after pemetrexed completed. 100 tablet 3   lidocaine-prilocaine (EMLA) cream APPLY TO THE PORT-A-CATH SITE 30 MINUTES BEFORE TREATMENT. 30 g 0   metoprolol succinate (TOPROL-XL) 50 MG 24 hr tablet TAKE 1 TABLET BY MOUTH DAILY. TAKE WITH OR IMMEDIATELY FOLLOWING  A MEAL. 60 tablet 5   Multiple Vitamin (MULTIVITAMIN) tablet Take 1 tablet by mouth daily.       ondansetron (ZOFRAN) 8 MG tablet Take 1 tablet (8 mg total) by mouth every 8 (eight) hours as needed for nausea or vomiting. 30 tablet 0   oxyCODONE (OXY IR/ROXICODONE) 5 MG immediate release tablet Take 1 tablet (5 mg total) by mouth every 6 (six) hours as needed for moderate pain. 60 tablet 0   pantoprazole (PROTONIX) 40 MG tablet Take 1 tablet (40 mg total)  by mouth daily before breakfast. 30 tablet 1   Polyethyl Glycol-Propyl Glycol (SYSTANE OP) Place 1 drop into both eyes daily as needed (dry eye).     prochlorperazine (COMPAZINE) 10 MG tablet Take 1 tablet (10 mg total) by mouth every 6 (six) hours as needed for nausea or vomiting. 30 tablet 1   sacubitril-valsartan (ENTRESTO) 49-51 MG Take 1 tablet by mouth 2 (two) times daily. 60 tablet 5   spironolactone (ALDACTONE) 25 MG tablet Take 0.5 tablets (12.5 mg total) by mouth daily. 30 tablet 1   No current facility-administered medications for this visit.   Facility-Administered Medications Ordered in Other Visits  Medication Dose Route Frequency Provider Last Rate Last Admin   heparin lock flush 100 unit/mL  500 Units Intracatheter Once Si Gaul, MD       sodium chloride flush (NS) 0.9 % injection 10 mL  10 mL Intracatheter Once Si Gaul, MD        SURGICAL HISTORY:  Past Surgical History:  Procedure Laterality Date   ABDOMINAL HYSTERECTOMY     BILATERAL OOPHORECTOMY     BIOPSY  12/09/2022   Procedure: BIOPSY;  Surgeon: Imogene Burn, MD;  Location: [facility] ENDOSCOPY;  Service: Gastroenterology;;   BRONCHIAL BIOPSY  01/09/2023   Procedure: BRONCHIAL BIOPSIES;  Surgeon: Josephine Igo, DO;  Location: [facility] ENDOSCOPY;  Service: Pulmonary;;   BRONCHIAL NEEDLE ASPIRATION BIOPSY  01/09/2023   Procedure: BRONCHIAL NEEDLE ASPIRATION BIOPSIES;  Surgeon: Josephine Igo, DO;  Location: [facility] ENDOSCOPY;  Service: Pulmonary;;   CATARACT EXTRACTION W/PHACO  12/27/2010   Procedure: CATARACT EXTRACTION PHACO AND INTRAOCULAR LENS PLACEMENT (IOC);  Surgeon: Gemma Payor;  Location: [facility] ORS;  Service: Ophthalmology;  Laterality: Right;   CATARACT EXTRACTION W/PHACO  03/28/2011   Procedure: CATARACT EXTRACTION PHACO AND INTRAOCULAR LENS PLACEMENT (IOC);  Surgeon: Gemma Payor;  Location: [facility] ORS;  Service: Ophthalmology;  Laterality: Left;  CDE 7.27   ENTEROSCOPY N/A 02/28/2023   Procedure: ENTEROSCOPY;   Surgeon: Iva Boop, MD;  Location: [facility] ENDOSCOPY;  Service: Gastroenterology;  Laterality: N/A;   ESOPHAGOGASTRODUODENOSCOPY (EGD) WITH PROPOFOL N/A 12/09/2022   Procedure: ESOPHAGOGASTRODUODENOSCOPY (EGD) WITH PROPOFOL;  Surgeon: Imogene Burn, MD;  Location: [facility] ENDOSCOPY;  Service: Gastroenterology;  Laterality: N/A;   HOT HEMOSTASIS N/A 02/28/2023   Procedure: HOT HEMOSTASIS (ARGON PLASMA COAGULATION/BICAP);  Surgeon: Iva Boop, MD;  Location: [facility] ENDOSCOPY;  Service: Gastroenterology;  Laterality: N/A;   IR IMAGING GUIDED PORT INSERTION  03/23/2023   SUBMUCOSAL TATTOO INJECTION  02/28/2023   Procedure: SUBMUCOSAL TATTOO INJECTION;  Surgeon: Iva Boop, MD;  Location: [facility] ENDOSCOPY;  Service: Gastroenterology;;    REVIEW OF SYSTEMS:  A comprehensive review of systems was negative except for: Constitutional: positive for fatigue   PHYSICAL EXAMINATION: General appearance: alert, cooperative, fatigued, and no distress Head: Normocephalic, without obvious abnormality, atraumatic Neck: no adenopathy, no JVD, supple, symmetrical, trachea midline, and thyroid not enlarged, symmetric, no tenderness/mass/nodules Lymph nodes: Cervical,  supraclavicular, and axillary nodes normal. Resp: clear to auscultation bilaterally Back: symmetric, no curvature. ROM normal. No CVA tenderness. Cardio: regular rate and rhythm, S1, S2 normal, no murmur, click, rub or gallop GI: soft, non-tender; bowel sounds normal; no masses,  no organomegaly Extremities: extremities normal, atraumatic, no cyanosis or edema  ECOG PERFORMANCE STATUS: 1 - Symptomatic but completely ambulatory  Blood pressure (!) 121/57, pulse 68, temperature 98.2 F (36.8 C), temperature source Oral, resp. rate 15, weight 132 lb 9.6 oz (60.1 kg), SpO2 97%.  LABORATORY DATA: Lab Results  Component Value Date   WBC 2.9 (L) 06/29/2023   HGB 9.2 (L) 06/29/2023   HCT 27.4 (L) 06/29/2023   MCV 93.8 06/29/2023   PLT 126 (L) 06/29/2023       Chemistry      Component Value Date/Time   NA 137 06/29/2023 1348   K 4.2 06/29/2023 1348   CL 102 06/29/2023 1348   CO2 29 06/29/2023 1348   BUN 27 (H) 06/29/2023 1348   CREATININE 1.02 (H) 06/29/2023 1348      Component Value Date/Time   CALCIUM 9.3 06/29/2023 1348   ALKPHOS 158 (H) 06/29/2023 1348   AST 41 06/29/2023 1348   ALT 33 06/29/2023 1348   BILITOT 0.3 06/29/2023 1348       RADIOGRAPHIC STUDIES: No results found.   ASSESSMENT AND PLAN: This is a very pleasant 73 years old African-American female recently diagnosed with a stage IV (T1c, N0, M1b) non-small cell lung cancer, adenocarcinoma with positive KRAS G12C mutation and PD-L1 expression of 1%.  She started systemic chemotherapy with carboplatin for AUC of 5, Alimta 500 Mg/M2 and Keytruda 200 Mg IV every 3 weeks.  She is status post 5 cycle of her treatment. She has been tolerating her treatment fairly well with no concerning adverse effects.    Stage IV Non-Small Cell Lung Cancer (NSCLC) Adenocarcinoma Stage IV NSCLC adenocarcinoma with KRAS G12C mutation, diagnosed in July 2024. Currently on cycle 6 of chemotherapy with Alimta and Keytruda. No new complaints of chest pain, dyspnea, nausea, vomiting, weight loss, headache, or vision changes. Mild orthostatic dizziness noted. Emphasized the importance of regular scans to monitor treatment response and adjust therapy as needed. - Proceed with cycle 6 of chemotherapy with Alimta and Keytruda - Order scan in two weeks to assess treatment response - Schedule follow-up visit in three weeks - Instruct patient to contact nurse if not called for scan appointment.   The patient was advised to call immediately if she has any other concerning symptoms in the interval. The patient voices understanding of current disease status and treatment options and is in agreement with the current care plan.  All questions were answered. The patient knows to call the clinic with any  problems, questions or concerns. We can certainly see the patient much sooner if necessary. The total time spent in the appointment was 20 minutes.  Disclaimer: This note was dictated with voice recognition software. Similar sounding words can inadvertently be transcribed and may not be corrected upon review.

## 2023-07-20 NOTE — Patient Instructions (Signed)
[facility] - A DEPT OF [facility]  Discharge Instructions: Thank you for choosing [facility] to provide your oncology and hematology care.   If you have a lab appointment with the Cancer Center, please go directly to the Cancer Center and check in at the registration area.   Wear comfortable clothing and clothing appropriate for easy access to any Portacath or PICC line.   We strive to give you quality time with your provider. You may need to reschedule your appointment if you arrive late (15 or more minutes).  Arriving late affects you and other patients whose appointments are after yours.  Also, if you miss three or more appointments without notifying the office, you may be dismissed from the clinic at the provider's discretion.      For prescription refill requests, have your pharmacy contact our office and allow 72 hours for refills to be completed.    Today you received the following chemotherapy and/or immunotherapy agents: Pembrolizumab (Keytruda) and Pemetrexed (Alimta)      To help prevent nausea and vomiting after your treatment, we encourage you to take your nausea medication as directed.  BELOW ARE SYMPTOMS THAT SHOULD BE REPORTED IMMEDIATELY: *FEVER GREATER THAN 100.4 F (38 C) OR HIGHER *CHILLS OR SWEATING *NAUSEA AND VOMITING THAT IS NOT CONTROLLED WITH YOUR NAUSEA MEDICATION *UNUSUAL SHORTNESS OF BREATH *UNUSUAL BRUISING OR BLEEDING *URINARY PROBLEMS (pain or burning when urinating, or frequent urination) *BOWEL PROBLEMS (unusual diarrhea, constipation, pain near the anus) TENDERNESS IN MOUTH AND THROAT WITH OR WITHOUT PRESENCE OF ULCERS (sore throat, sores in mouth, or a toothache) UNUSUAL RASH, SWELLING OR PAIN  UNUSUAL VAGINAL DISCHARGE OR ITCHING   Items with * indicate a potential emergency and should be followed up as soon as possible or go to the Emergency Department if any problems should occur.  Please show the  CHEMOTHERAPY ALERT CARD or IMMUNOTHERAPY ALERT CARD at check-in to the Emergency Department and triage nurse.  Should you have questions after your visit or need to cancel or reschedule your appointment, please contact [facility]  Dept: [phone]  and follow the prompts.  Office hours are 8:00 a.m. to 4:30 p.m. Monday - Friday. Please note that voicemails left after 4:00 p.m. may not be returned until the following business day.  We are closed weekends and major holidays. You have access to a nurse at all times for urgent questions. Please call the main number to the clinic Dept: [phone] and follow the prompts.   For any non-urgent questions, you may also contact your provider using MyChart. We now offer e-Visits for anyone 12 and older to request care online for non-urgent symptoms. For details visit [email or web address].   Also download the MyChart app! Go to the app store, search "MyChart", open the app, select [facility], and log in with your MyChart username and password.

## 2023-07-21 LAB — T4: T4, Total: 9.3 ug/dL (ref 4.5–12.0)

## 2023-07-25 ENCOUNTER — Telehealth: Payer: Self-pay | Admitting: Internal Medicine

## 2023-07-25 NOTE — Telephone Encounter (Signed)
[facility] @  Clinical Research Coordinator note:   This visit for Subject Erin Pacheco with DOB: 1951/01/27 on 07/25/2023. Subject contacted regarding [id number] to inform that Principal Investigator has changed to Dr. Delton Coombes. Patient expressed understanding.

## 2023-07-28 MED ORDER — APIXABAN 5 MG PO TABS: 5.0000 mg | ORAL_TABLET | Freq: Two times a day (BID) | ORAL | 3 refills | Status: AC

## 2023-07-28 MED ORDER — APIXABAN 5 MG PO TABS
5.0000 mg | ORAL_TABLET | Freq: Two times a day (BID) | ORAL | 0 refills | Status: AC
Start: 2023-06-22 — End: ?

## 2023-07-28 NOTE — Addendum Note (Signed)
Addended by: Rosalee Kaufman on: 07/28/2023 12:33 PM   Modules accepted: Orders

## 2023-07-29 NOTE — Progress Notes (Signed)
 Updated pemetrexed ERX to [id number]   Pryor Ochoa, PharmD 07/29/23

## 2023-07-30 DIAGNOSIS — D6869 Other thrombophilia: Secondary | ICD-10-CM | POA: Diagnosis not present

## 2023-07-30 DIAGNOSIS — I110 Hypertensive heart disease with heart failure: Secondary | ICD-10-CM | POA: Diagnosis not present

## 2023-07-30 DIAGNOSIS — D84822 Immunodeficiency due to external causes: Secondary | ICD-10-CM | POA: Diagnosis not present

## 2023-07-30 DIAGNOSIS — M1990 Unspecified osteoarthritis, unspecified site: Secondary | ICD-10-CM | POA: Diagnosis not present

## 2023-07-30 DIAGNOSIS — I4891 Unspecified atrial fibrillation: Secondary | ICD-10-CM | POA: Diagnosis not present

## 2023-07-30 DIAGNOSIS — I700 Atherosclerosis of aorta: Secondary | ICD-10-CM | POA: Diagnosis not present

## 2023-07-30 DIAGNOSIS — C3490 Malignant neoplasm of unspecified part of unspecified bronchus or lung: Secondary | ICD-10-CM | POA: Diagnosis not present

## 2023-07-30 DIAGNOSIS — Z87891 Personal history of nicotine dependence: Secondary | ICD-10-CM | POA: Diagnosis not present

## 2023-07-30 DIAGNOSIS — C7800 Secondary malignant neoplasm of unspecified lung: Secondary | ICD-10-CM | POA: Diagnosis not present

## 2023-07-30 DIAGNOSIS — Z008 Encounter for other general examination: Secondary | ICD-10-CM | POA: Diagnosis not present

## 2023-07-30 DIAGNOSIS — Z8249 Family history of ischemic heart disease and other diseases of the circulatory system: Secondary | ICD-10-CM | POA: Diagnosis not present

## 2023-07-30 DIAGNOSIS — J439 Emphysema, unspecified: Secondary | ICD-10-CM | POA: Diagnosis not present

## 2023-07-30 DIAGNOSIS — I509 Heart failure, unspecified: Secondary | ICD-10-CM | POA: Diagnosis not present

## 2023-08-03 ENCOUNTER — Ambulatory Visit (HOSPITAL_COMMUNITY)
Admission: RE | Admit: 2023-08-03 | Discharge: 2023-08-03 | Disposition: A | Discharge status: Home / Self Care | Payer: Medicare HMO | Source: Ambulatory Visit | Attending: Internal Medicine | Admitting: Internal Medicine

## 2023-08-03 ENCOUNTER — Other Ambulatory Visit: Payer: Self-pay | Admitting: Internal Medicine

## 2023-08-03 ENCOUNTER — Encounter: Payer: Self-pay | Admitting: Internal Medicine

## 2023-08-03 DIAGNOSIS — C3490 Malignant neoplasm of unspecified part of unspecified bronchus or lung: Secondary | ICD-10-CM | POA: Diagnosis not present

## 2023-08-03 DIAGNOSIS — K4020 Bilateral inguinal hernia, without obstruction or gangrene, not specified as recurrent: Secondary | ICD-10-CM | POA: Diagnosis not present

## 2023-08-03 DIAGNOSIS — C3412 Malignant neoplasm of upper lobe, left bronchus or lung: Secondary | ICD-10-CM | POA: Diagnosis not present

## 2023-08-03 DIAGNOSIS — R918 Other nonspecific abnormal finding of lung field: Secondary | ICD-10-CM | POA: Diagnosis not present

## 2023-08-03 MED ORDER — IOHEXOL 300 MG/ML  SOLN
100.0000 mL | Freq: Once | INTRAMUSCULAR | Status: AC | PRN
  Administered 2023-08-03: 100 mL via INTRAVENOUS

## 2023-08-07 NOTE — Progress Notes (Unsigned)
 [facility] OFFICE PROGRESS NOTE  Erin Canary, [name] [address] [place] [address]  DIAGNOSIS: Stage IV (T1c, N0, M1b) Non-Small Cell Lung Cancer, adenocarcinoma. She presented with a spiculated left upper lobe lung nodule and large right adrenal gland mass. She was diagnosed in July 2024. She had molecular studies that showed she has KRASG12C which can be used in the second line setting.    PDL1: 1%   Molecular Studies: STK11 but likely not candidate for 4 drug combination and KRASG12C  PRIOR THERAPY: SBRT to the left upper lobe and right adrenal targets under the care of Dr. Mitzi Hansen. Last dose of treatment expected on 02/20/2023   CURRENT THERAPY: Systemic chemotherapy with carboplatin for AUC of 5, Alimta 500 Mg/M2 and Keytruda 200 Mg IV every 3 weeks.  First dose April 05, 2023. Status post 6 cycles. Starting from cycle #5, she started maintenance Alimta and Keytruda.  2) IV iron with Venofer 300 mg weekly PRN. Last dose on 03/21/23.  INTERVAL HISTORY: Erin Pacheco 73 y.o. female returns to the clinic today for a follow up visit accompanied by her daughter.  The patient is currently undergoing maintenance chemotherapy and immunotherapy.  He is tolerating her treatment well.  She states her energy is" ***".She denies any fever, chills, or night sweats.  She reports that her appetite is improving and she gained weight since last being seen***.    She reports her breathing is "good".   Denies any significant shortness of breath or significant cough.  Denies any chest pain or hemoptysis.  She denies any nausea or vomiting.  She sometimes struggles with constipation intermittently but has laxatives with MiraLAX if needed.  Denies any headache or visual changes.  Denies any rashes or skin changes.  He is compliant with her iron supplement.  The patient has a history of iron deficiency anemia secondary to GI bleeding.  She is on Eliquis and compliant.  She  previously received IV iron.  She recently had a restaging CT scan. 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  MEDICATIONS:  Current Outpatient Medications  Medication Sig Dispense Refill   acetaminophen (TYLENOL) 325 MG tablet Take 2 tablets (650 mg total) by mouth every 6 (six) hours as needed for mild pain (or Fever >/= 101).     apixaban (ELIQUIS) 5 MG TABS tablet Take 1 tablet (5 mg total) by mouth 2 (two) times daily. 60 tablet 5   apixaban (ELIQUIS) 5 MG TABS tablet Take 1 tablet (5 mg total) by mouth 2 (two) times daily. 60 tablet 3   apixaban (ELIQUIS) 5 MG TABS tablet Take 1 tablet (5 mg total) by mouth 2 (two) times daily. 42 tablet 0   Fluticasone-Umeclidin-Vilant (TRELEGY ELLIPTA) 100-62.5-25 MCG/ACT AEPB Inhale 1 each into the lungs daily.     folic acid (FOLVITE) 1 MG tablet Take 1 tablet (1 mg total) by mouth daily. Start 7 days before pemetrexed chemotherapy. Continue until 21 days after pemetrexed completed. 100 tablet 3   lidocaine-prilocaine (EMLA) cream APPLY TO THE PORT-A-CATH SITE 30  MINUTES BEFORE TREATMENT. 30 g 0   metoprolol succinate (TOPROL-XL) 50 MG 24 hr tablet TAKE 1 TABLET BY MOUTH DAILY. TAKE WITH OR IMMEDIATELY FOLLOWING A MEAL. 60 tablet 5   Multiple Vitamin (MULTIVITAMIN) tablet Take 1 tablet by mouth daily.       ondansetron (ZOFRAN) 8 MG tablet Take 1 tablet (8 mg total) by mouth every 8 (eight) hours as needed for nausea or vomiting. 30 tablet 0   oxyCODONE (OXY IR/ROXICODONE) 5 MG immediate release tablet Take 1 tablet (5 mg total) by mouth every 6 (six) hours as needed for moderate pain. 60  tablet 0   pantoprazole (PROTONIX) 40 MG tablet Take 1 tablet (40 mg total) by mouth daily before breakfast. 30 tablet 1   Polyethyl Glycol-Propyl Glycol (SYSTANE OP) Place 1 drop into both eyes daily as needed (dry eye).     prochlorperazine (COMPAZINE) 10 MG tablet Take 1 tablet (10 mg total) by mouth every 6 (six) hours as needed for nausea or vomiting. 30 tablet 1   sacubitril-valsartan (ENTRESTO) 49-51 MG Take 1 tablet by mouth 2 (two) times daily. 60 tablet 5   spironolactone (ALDACTONE) 25 MG tablet Take 0.5 tablets (12.5 mg total) by mouth daily. 30 tablet 1   No current facility-administered medications for this visit.   Facility-Administered Medications Ordered in Other Visits  Medication Dose Route Frequency Provider Last Rate Last Admin   heparin lock flush 100 unit/mL  500 Units Intracatheter Once Si Gaul, MD       sodium chloride flush (NS) 0.9 % injection 10 mL  10 mL Intracatheter Once Si Gaul, MD        SURGICAL HISTORY:  Past Surgical History:  Procedure Laterality Date   ABDOMINAL HYSTERECTOMY     BILATERAL OOPHORECTOMY     BIOPSY  12/09/2022   Procedure: BIOPSY;  Surgeon: Imogene Burn, MD;  Location: [facility]COPY;  Service: Gastroenterology;;   BRONCHIAL BIOPSY  01/09/2023   Procedure: BRONCHIAL BIOPSIES;  Surgeon: Josephine Igo, DO;  Location: [facility] ENDOSCOPY;  Service: Pulmonary;;   BRONCHIAL NEEDLE ASPIRATION BIOPSY  01/09/2023   Procedure: BRONCHIAL NEEDLE ASPIRATION BIOPSIES;  Surgeon: Josephine Igo, DO;  Location: [facility] ENDOSCOPY;  Service: Pulmonary;;   CATARACT EXTRACTION W/PHACO  12/27/2010   Procedure: CATARACT EXTRACTION PHACO AND INTRAOCULAR LENS PLACEMENT (IOC);  Surgeon: Gemma Payor;  Location: [facility] ORS;  Service: Ophthalmology;  Laterality: Right;   CATARACT EXTRACTION W/PHACO  03/28/2011   Procedure: CATARACT EXTRACTION PHACO AND INTRAOCULAR LENS PLACEMENT (IOC);  Surgeon: Gemma Payor;  Location: [facility] ORS;  Service: Ophthalmology;  Laterality:  Left;  CDE 7.27   ENTEROSCOPY N/A 02/28/2023   Procedure: ENTEROSCOPY;  Surgeon: Iva Boop, MD;  Location: [facility] ENDOSCOPY;  Service: Gastroenterology;  Laterality: N/A;   ESOPHAGOGASTRODUODENOSCOPY (EGD) WITH PROPOFOL N/A 12/09/2022   Procedure: ESOPHAGOGASTRODUODENOSCOPY (EGD) WITH PROPOFOL;  Surgeon: Imogene Burn, MD;  Location: [facility] ENDOSCOPY;  Service: Gastroenterology;  Laterality: N/A;   HOT HEMOSTASIS N/A 02/28/2023   Procedure: HOT HEMOSTASIS (ARGON PLASMA COAGULATION/BICAP);  Surgeon: Iva Boop, MD;  Location: [facility] ENDOSCOPY;  Service: Gastroenterology;  Laterality: N/A;   IR IMAGING GUIDED PORT INSERTION  03/23/2023   SUBMUCOSAL TATTOO INJECTION  02/28/2023   Procedure: SUBMUCOSAL TATTOO INJECTION;  Surgeon: Iva Boop, MD;  Location: [facility] ENDOSCOPY;  Service: Gastroenterology;;    REVIEW OF SYSTEMS:   Review of Systems  Constitutional: Negative for appetite change, chills, fatigue, fever and unexpected weight change.  HENT:   Negative for  mouth sores, nosebleeds, sore throat and trouble swallowing.   Eyes: Negative for eye problems and icterus.  Respiratory: Negative for cough, hemoptysis, shortness of breath and wheezing.   Cardiovascular: Negative for chest pain and leg swelling.  Gastrointestinal: Negative for abdominal pain, constipation, diarrhea, nausea and vomiting.  Genitourinary: Negative for bladder incontinence, difficulty urinating, dysuria, frequency and hematuria.   Musculoskeletal: Negative for back pain, gait problem, neck pain and neck stiffness.  Skin: Negative for itching and rash.  Neurological: Negative for dizziness, extremity weakness, gait problem, headaches, light-headedness and seizures.  Hematological: Negative for adenopathy. Does not bruise/bleed easily.  Psychiatric/Behavioral: Negative for confusion, depression and sleep disturbance. The patient is not nervous/anxious.     PHYSICAL EXAMINATION:  There were no vitals taken for this  visit.  ECOG PERFORMANCE STATUS: {CHL ONC ECOG [id number]  Physical Exam  Constitutional: Oriented to person, place, and time and well-developed, well-nourished, and in no distress. No distress.  HENT:  Head: Normocephalic and atraumatic.  Mouth/Throat: Oropharynx is clear and moist. No oropharyngeal exudate.  Eyes: Conjunctivae are normal. Right eye exhibits no discharge. Left eye exhibits no discharge. No scleral icterus.  Neck: Normal range of motion. Neck supple.  Cardiovascular: Normal rate, regular rhythm, normal heart sounds and intact distal pulses.   Pulmonary/Chest: Effort normal and breath sounds normal. No respiratory distress. No wheezes. No rales.  Abdominal: Soft. Bowel sounds are normal. Exhibits no distension and no mass. There is no tenderness.  Musculoskeletal: Normal range of motion. Exhibits no edema.  Lymphadenopathy:    No cervical adenopathy.  Neurological: Alert and oriented to person, place, and time. Exhibits normal muscle tone. Gait normal. Coordination normal.  Skin: Skin is warm and dry. No rash noted. Not diaphoretic. No erythema. No pallor.  Psychiatric: Mood, memory and judgment normal.  Vitals reviewed.  LABORATORY DATA: Lab Results  Component Value Date   WBC 3.7 (L) 07/20/2023   HGB 9.4 (L) 07/20/2023   HCT 28.6 (L) 07/20/2023   MCV 97.9 07/20/2023   PLT 313 07/20/2023      Chemistry      Component Value Date/Time   NA 137 07/20/2023 1314   K 4.3 07/20/2023 1314   CL 105 07/20/2023 1314   CO2 30 07/20/2023 1314   BUN 21 07/20/2023 1314   CREATININE 1.04 (H) 07/20/2023 1314      Component Value Date/Time   CALCIUM 9.2 07/20/2023 1314   ALKPHOS 167 (H) 07/20/2023 1314   AST 44 (H) 07/20/2023 1314   ALT 33 07/20/2023 1314   BILITOT 0.3 07/20/2023 1314       RADIOGRAPHIC STUDIES:  No results found.   ASSESSMENT/PLAN:  This is a very pleasant 73 year old female with stage IV (T1c, N0, M1) Non-Small Cell Lung Cancer,  adenocarcinoma. She presented with a spiculated left upper lobe lung nodule and large right adrenal gland mass. She was diagnosed in July 2024. She had molecular studies that showed she has KRASG12C which can be used in the second line setting.  PDL1 1%, she has STK 11.  Dr. Arbutus Ped does not feel that the patient is not a good candidate for the 4 drug combination and her performance status.   She underwent radiation to the adrenal lesion and the lung under the care of Dr. Mitzi Hansen. The last dose was on 02/20/23   She is currently on palliative chemotherapy with carboplatin for an AUC of 5, Alimta 500 mg/m2 and immunotherapy with Keytruda 200 mg IV every 3 weeks. First  dose on 04/05/23 which she tolerated well except for neutropenia for which she receives zarxio as needed. She is status post 5 cycles.  Starting from cycle #5, she started maintenance Alimta and Keytruda.   The patient was seen with Dr. Arbutus Ped today.  Dr. Arbutus Ped personally and independently reviewed the scan and discussed results with the patient today.  The scan showed ***.  Dr. Arbutus Ped recommends ***  Labs were reviewed. Recommend that she *** with cycle #7 today as scheduled.   She will continue her iron supplement and blood thinner.    Of note, she is allergic to chloraprep.    Will see her back for labs and follow-up visit in 3 weeks for evaluation repeat blood work before undergoing cycle #8  The patient was advised to call immediately if she has any concerning symptoms in the interval. The patient voices understanding of current disease status and treatment options and is in agreement with the current care plan. All questions were answered. The patient knows to call the clinic with any problems, questions or concerns. We can certainly see the patient much sooner if necessary  No orders of the defined types were placed in this encounter.    I spent {CHL ONC TIME VISIT - WUJWJ:[id number]} counseling the patient face to face.  The total time spent in the appointment was {CHL ONC TIME VISIT - OZHYQ:[id number]}.  Sayf Kerner L Terrie Grajales, [name] 08/07/23

## 2023-08-08 ENCOUNTER — Encounter: Payer: Self-pay | Admitting: Physician Assistant

## 2023-08-08 ENCOUNTER — Encounter: Payer: Self-pay | Admitting: Internal Medicine

## 2023-08-10 ENCOUNTER — Inpatient Hospital Stay: Payer: Medicare HMO | Attending: Physician Assistant

## 2023-08-10 ENCOUNTER — Inpatient Hospital Stay: Payer: Medicare HMO

## 2023-08-10 ENCOUNTER — Inpatient Hospital Stay (HOSPITAL_BASED_OUTPATIENT_CLINIC_OR_DEPARTMENT_OTHER): Payer: Medicare HMO | Admitting: Physician Assistant

## 2023-08-10 VITALS — BP 132/83 | HR 68 | Temp 97.7°F | Resp 13 | Wt 135.4 lb

## 2023-08-10 VITALS — BP 130/54 | HR 66 | Temp 98.8°F | Resp 16

## 2023-08-10 DIAGNOSIS — D508 Other iron deficiency anemias: Secondary | ICD-10-CM

## 2023-08-10 DIAGNOSIS — C3412 Malignant neoplasm of upper lobe, left bronchus or lung: Secondary | ICD-10-CM | POA: Diagnosis not present

## 2023-08-10 DIAGNOSIS — Z5111 Encounter for antineoplastic chemotherapy: Secondary | ICD-10-CM | POA: Diagnosis present

## 2023-08-10 DIAGNOSIS — D6481 Anemia due to antineoplastic chemotherapy: Secondary | ICD-10-CM | POA: Diagnosis not present

## 2023-08-10 DIAGNOSIS — C3492 Malignant neoplasm of unspecified part of left bronchus or lung: Secondary | ICD-10-CM

## 2023-08-10 DIAGNOSIS — Z5112 Encounter for antineoplastic immunotherapy: Secondary | ICD-10-CM | POA: Insufficient documentation

## 2023-08-10 DIAGNOSIS — C7971 Secondary malignant neoplasm of right adrenal gland: Secondary | ICD-10-CM | POA: Insufficient documentation

## 2023-08-10 DIAGNOSIS — Z79620 Long term (current) use of immunosuppressive biologic: Secondary | ICD-10-CM | POA: Insufficient documentation

## 2023-08-10 DIAGNOSIS — Z95828 Presence of other vascular implants and grafts: Secondary | ICD-10-CM

## 2023-08-10 DIAGNOSIS — D701 Agranulocytosis secondary to cancer chemotherapy: Secondary | ICD-10-CM

## 2023-08-10 LAB — CMP (CANCER CENTER ONLY)
ALT: 31 U/L (ref 0–44)
AST: 44 U/L — ABNORMAL HIGH (ref 15–41)
Albumin: 3.3 g/dL — ABNORMAL LOW (ref 3.5–5.0)
Alkaline Phosphatase: 163 U/L — ABNORMAL HIGH (ref 38–126)
Anion gap: 2 — ABNORMAL LOW (ref 5–15)
BUN: 23 mg/dL (ref 8–23)
CO2: 30 mmol/L (ref 22–32)
Calcium: 8.9 mg/dL (ref 8.9–10.3)
Chloride: 106 mmol/L (ref 98–111)
Creatinine: 1.26 mg/dL — ABNORMAL HIGH (ref 0.44–1.00)
GFR, Estimated: 45 mL/min — ABNORMAL LOW (ref >60)
Glucose, Bld: 79 mg/dL (ref 70–99)
Potassium: 4.3 mmol/L (ref 3.5–5.1)
Sodium: 138 mmol/L (ref 135–145)
Total Bilirubin: 0.3 mg/dL (ref 0.0–1.2)
Total Protein: 7.6 g/dL (ref 6.5–8.1)

## 2023-08-10 LAB — CBC WITH DIFFERENTIAL (CANCER CENTER ONLY)
Abs Immature Granulocytes: 0.02 10*3/uL (ref 0.00–0.07)
Basophils Absolute: 0 10*3/uL (ref 0.0–0.1)
Basophils Relative: 1 %
Eosinophils Absolute: 0.1 10*3/uL (ref 0.0–0.5)
Eosinophils Relative: 2 %
HCT: 30.1 % — ABNORMAL LOW (ref 36.0–46.0)
Hemoglobin: 10 g/dL — ABNORMAL LOW (ref 12.0–15.0)
Immature Granulocytes: 1 %
Lymphocytes Relative: 26 %
Lymphs Abs: 1.1 10*3/uL (ref 0.7–4.0)
MCH: 33.7 pg (ref 26.0–34.0)
MCHC: 33.2 g/dL (ref 30.0–36.0)
MCV: 101.3 fL — ABNORMAL HIGH (ref 80.0–100.0)
Monocytes Absolute: 0.9 10*3/uL (ref 0.1–1.0)
Monocytes Relative: 21 %
Neutro Abs: 2.2 10*3/uL (ref 1.7–7.7)
Neutrophils Relative %: 49 %
Platelet Count: 322 10*3/uL (ref 150–400)
RBC: 2.97 MIL/uL — ABNORMAL LOW (ref 3.87–5.11)
RDW: 17.6 % — ABNORMAL HIGH (ref 11.5–15.5)
WBC Count: 4.3 10*3/uL (ref 4.0–10.5)
nRBC: 0 % (ref 0.0–0.2)

## 2023-08-10 MED ORDER — SODIUM CHLORIDE 0.9 % IV SOLN
500.0000 mg/m2 | Freq: Once | INTRAVENOUS | Status: AC
  Administered 2023-08-10: 800 mg via INTRAVENOUS
  Filled 2023-08-10: qty 20

## 2023-08-10 MED ORDER — PROCHLORPERAZINE MALEATE 10 MG PO TABS
10.0000 mg | ORAL_TABLET | Freq: Once | ORAL | Status: AC
  Administered 2023-08-10: 10 mg via ORAL
  Filled 2023-08-10: qty 1

## 2023-08-10 MED ORDER — PEMBROLIZUMAB CHEMO INJECTION 100 MG/4ML
200.0000 mg | Freq: Once | INTRAVENOUS | Status: AC
  Administered 2023-08-10: 200 mg via INTRAVENOUS
  Filled 2023-08-10: qty 200

## 2023-08-10 MED ORDER — HEPARIN SOD (PORK) LOCK FLUSH 100 UNIT/ML IV SOLN
500.0000 [IU] | Freq: Once | INTRAVENOUS | Status: AC | PRN
  Administered 2023-08-10: 500 [IU]

## 2023-08-10 MED ORDER — SODIUM CHLORIDE 0.9 % IV SOLN: Freq: Once | INTRAVENOUS | Status: AC

## 2023-08-10 MED ORDER — SODIUM CHLORIDE 0.9% FLUSH
10.0000 mL | INTRAVENOUS | Status: DC | PRN
  Administered 2023-08-10: 10 mL

## 2023-08-10 MED ORDER — SODIUM CHLORIDE 0.9% FLUSH
10.0000 mL | Freq: Once | INTRAVENOUS | Status: AC
  Administered 2023-08-10: 10 mL

## 2023-08-10 NOTE — Patient Instructions (Signed)
 [facility] A DE[facility]  Discharge Instructions: Thank you for choosing [facility] to provide your oncology and hematology care.   If you have a lab appointment with the Cancer Center, please go directly to the Cancer Center and check in at the registration area.   Wear comfortable clothing and clothing appropriate for easy access to any Portacath or PICC line.   We strive to give you quality time with your provider. You may need to reschedule your appointment if you arrive late (15 or more minutes).  Arriving late affects you and other patients whose appointments are after yours.  Also, if you miss three or more appointments without notifying the office, you may be dismissed from the clinic at the provider's discretion.      For prescription refill requests, have your pharmacy contact our office and allow 72 hours for refills to be completed.    Today you received the following chemotherapy and/or immunotherapy agents: Pembrolizumab (Keytruda) and Pemetrexed (Alimta)      To help prevent nausea and vomiting after your treatment, we encourage you to take your nausea medication as directed.  BELOW ARE SYMPTOMS THAT SHOULD BE REPORTED IMMEDIATELY: *FEVER GREATER THAN 100.4 F (38 C) OR HIGHER *CHILLS OR SWEATING *NAUSEA AND VOMITING THAT IS NOT CONTROLLED WITH YOUR NAUSEA MEDICATION *UNUSUAL SHORTNESS OF BREATH *UNUSUAL BRUISING OR BLEEDING *URINARY PROBLEMS (pain or burning when urinating, or frequent urination) *BOWEL PROBLEMS (unusual diarrhea, constipation, pain near the anus) TENDERNESS IN MOUTH AND THROAT WITH OR WITHOUT PRESENCE OF ULCERS (sore throat, sores in mouth, or a toothache) UNUSUAL RASH, SWELLING OR PAIN  UNUSUAL VAGINAL DISCHARGE OR ITCHING   Items with * indicate a potential emergency and should be followed up as soon as possible or go to the Emergency Department if any problems should occur.  Please show the  CHEMOTHERAPY ALERT CARD or IMMUNOTHERAPY ALERT CARD at check-in to the Emergency Department and triage nurse.  Should you have questions after your visit or need to cancel or reschedule your appointment, please contact [facility]  Dept: [phone]  and follow the prompts.  Office hours are 8:00 a.m. to 4:30 p.m. Monday - Friday. Please note that voicemails left after 4:00 p.m. may not be returned until the following business day.  We are closed weekends and major holidays. You have access to a nurse at all times for urgent questions. Please call the main number to the clinic Dept: [phone] and follow the prompts.   For any non-urgent questions, you may also contact your provider using MyChart. We now offer e-Visits for anyone 12 and older to request care online for non-urgent symptoms. For details visit [email or web address].   Also download the MyChart app! Go to the app store, search "MyChart", open the app, select [facility], and log in with your MyChart username and password.

## 2023-08-21 ENCOUNTER — Ambulatory Visit (HOSPITAL_COMMUNITY)
Admission: RE | Admit: 2023-08-21 | Discharge: 2023-08-21 | Disposition: A | Discharge status: Home / Self Care | Payer: Medicare HMO | Source: Ambulatory Visit | Attending: Physician Assistant | Admitting: Physician Assistant

## 2023-08-21 DIAGNOSIS — I3139 Other pericardial effusion (noninflammatory): Secondary | ICD-10-CM | POA: Diagnosis not present

## 2023-08-21 DIAGNOSIS — I517 Cardiomegaly: Secondary | ICD-10-CM | POA: Insufficient documentation

## 2023-08-21 DIAGNOSIS — I519 Heart disease, unspecified: Secondary | ICD-10-CM | POA: Diagnosis not present

## 2023-08-21 LAB — ECHOCARDIOGRAM COMPLETE
AR max vel: 2.2 cm2
AV Area VTI: 2.02 cm2
AV Area mean vel: 2.23 cm2
AV Mean grad: 3 mmHg
AV Peak grad: 5.5 mmHg
Ao pk vel: 1.17 m/s
Area-P 1/2: 3.06 cm2
S' Lateral: 3.8 cm

## 2023-08-29 ENCOUNTER — Other Ambulatory Visit: Payer: Self-pay | Admitting: Physician Assistant

## 2023-08-31 ENCOUNTER — Other Ambulatory Visit: Payer: Self-pay

## 2023-08-31 ENCOUNTER — Inpatient Hospital Stay: Payer: Medicare HMO | Admitting: Internal Medicine

## 2023-08-31 ENCOUNTER — Inpatient Hospital Stay: Payer: Medicare HMO

## 2023-08-31 ENCOUNTER — Encounter: Payer: Self-pay | Admitting: Internal Medicine

## 2023-08-31 VITALS — BP 124/68 | HR 66 | Temp 98.5°F | Resp 16 | Ht 63.0 in | Wt 138.9 lb

## 2023-08-31 DIAGNOSIS — T451X5A Adverse effect of antineoplastic and immunosuppressive drugs, initial encounter: Secondary | ICD-10-CM

## 2023-08-31 DIAGNOSIS — Z95828 Presence of other vascular implants and grafts: Secondary | ICD-10-CM

## 2023-08-31 DIAGNOSIS — C3492 Malignant neoplasm of unspecified part of left bronchus or lung: Secondary | ICD-10-CM

## 2023-08-31 DIAGNOSIS — R918 Other nonspecific abnormal finding of lung field: Secondary | ICD-10-CM

## 2023-08-31 DIAGNOSIS — D508 Other iron deficiency anemias: Secondary | ICD-10-CM

## 2023-08-31 DIAGNOSIS — C3412 Malignant neoplasm of upper lobe, left bronchus or lung: Secondary | ICD-10-CM

## 2023-08-31 DIAGNOSIS — Z5112 Encounter for antineoplastic immunotherapy: Secondary | ICD-10-CM | POA: Diagnosis not present

## 2023-08-31 LAB — CBC WITH DIFFERENTIAL (CANCER CENTER ONLY)
Abs Immature Granulocytes: 0.03 10*3/uL (ref 0.00–0.07)
Basophils Absolute: 0 10*3/uL (ref 0.0–0.1)
Basophils Relative: 1 %
Eosinophils Absolute: 0.1 10*3/uL (ref 0.0–0.5)
Eosinophils Relative: 1 %
HCT: 30.8 % — ABNORMAL LOW (ref 36.0–46.0)
Hemoglobin: 9.9 g/dL — ABNORMAL LOW (ref 12.0–15.0)
Immature Granulocytes: 1 %
Lymphocytes Relative: 25 %
Lymphs Abs: 1.1 10*3/uL (ref 0.7–4.0)
MCH: 33 pg (ref 26.0–34.0)
MCHC: 32.1 g/dL (ref 30.0–36.0)
MCV: 102.7 fL — ABNORMAL HIGH (ref 80.0–100.0)
Monocytes Absolute: 1 10*3/uL (ref 0.1–1.0)
Monocytes Relative: 22 %
Neutro Abs: 2.2 10*3/uL (ref 1.7–7.7)
Neutrophils Relative %: 50 %
Platelet Count: 280 10*3/uL (ref 150–400)
RBC: 3 MIL/uL — ABNORMAL LOW (ref 3.87–5.11)
RDW: 16.4 % — ABNORMAL HIGH (ref 11.5–15.5)
WBC Count: 4.3 10*3/uL (ref 4.0–10.5)
nRBC: 0 % (ref 0.0–0.2)

## 2023-08-31 LAB — IRON AND IRON BINDING CAPACITY (CC-WL,HP ONLY)
Iron: 63 ug/dL (ref 28–170)
Saturation Ratios: 24 % (ref 10.4–31.8)
TIBC: 260 ug/dL (ref 250–450)
UIBC: 197 ug/dL (ref 148–442)

## 2023-08-31 LAB — CMP (CANCER CENTER ONLY)
ALT: 25 U/L (ref 0–44)
AST: 36 U/L (ref 15–41)
Albumin: 3.1 g/dL — ABNORMAL LOW (ref 3.5–5.0)
Alkaline Phosphatase: 145 U/L — ABNORMAL HIGH (ref 38–126)
Anion gap: 2 — ABNORMAL LOW (ref 5–15)
BUN: 26 mg/dL — ABNORMAL HIGH (ref 8–23)
CO2: 29 mmol/L (ref 22–32)
Calcium: 9 mg/dL (ref 8.9–10.3)
Chloride: 107 mmol/L (ref 98–111)
Creatinine: 1.4 mg/dL — ABNORMAL HIGH (ref 0.44–1.00)
GFR, Estimated: 40 mL/min — ABNORMAL LOW (ref >60)
Glucose, Bld: 87 mg/dL (ref 70–99)
Potassium: 4.3 mmol/L (ref 3.5–5.1)
Sodium: 138 mmol/L (ref 135–145)
Total Bilirubin: 0.3 mg/dL (ref 0.0–1.2)
Total Protein: 7.6 g/dL (ref 6.5–8.1)

## 2023-08-31 LAB — SAMPLE TO BLOOD BANK

## 2023-08-31 LAB — FERRITIN: Ferritin: 791 ng/mL — ABNORMAL HIGH (ref 11–307)

## 2023-08-31 MED ORDER — SODIUM CHLORIDE 0.9% FLUSH
10.0000 mL | INTRAVENOUS | Status: DC | PRN
  Administered 2023-08-31: 10 mL

## 2023-08-31 MED ORDER — SODIUM CHLORIDE 0.9 % IV SOLN
500.0000 mg/m2 | Freq: Once | INTRAVENOUS | Status: AC
  Administered 2023-08-31: 800 mg via INTRAVENOUS
  Filled 2023-08-31: qty 20

## 2023-08-31 MED ORDER — SODIUM CHLORIDE 0.9 % IV SOLN: Freq: Once | INTRAVENOUS | Status: AC

## 2023-08-31 MED ORDER — SODIUM CHLORIDE 0.9% FLUSH
10.0000 mL | Freq: Once | INTRAVENOUS | Status: AC
Start: 2023-08-31 — End: 2023-08-31
  Administered 2023-08-31: 10 mL

## 2023-08-31 MED ORDER — HEPARIN SOD (PORK) LOCK FLUSH 100 UNIT/ML IV SOLN
500.0000 [IU] | Freq: Once | INTRAVENOUS | Status: AC | PRN
  Administered 2023-08-31: 500 [IU]

## 2023-08-31 MED ORDER — PROCHLORPERAZINE MALEATE 10 MG PO TABS
10.0000 mg | ORAL_TABLET | Freq: Once | ORAL | Status: AC
  Administered 2023-08-31: 10 mg via ORAL
  Filled 2023-08-31: qty 1

## 2023-08-31 MED ORDER — SODIUM CHLORIDE 0.9 % IV SOLN
200.0000 mg | Freq: Once | INTRAVENOUS | Status: AC
  Administered 2023-08-31: 200 mg via INTRAVENOUS
  Filled 2023-08-31: qty 200

## 2023-08-31 NOTE — Progress Notes (Signed)
 OK to continue Pemetrexed at same dose despite CrCl = 36 mL/min per Dr. Arbutus Ped.    Ebony Hail, Pharm.D., CPP 08/31/2023@2 :34 PM

## 2023-08-31 NOTE — Patient Instructions (Signed)
 [facility] A DE[facility]  Discharge Instructions: Thank you for choosing [facility] to provide your oncology and hematology care.   If you have a lab appointment with the Cancer Center, please go directly to the Cancer Center and check in at the registration area.   Wear comfortable clothing and clothing appropriate for easy access to any Portacath or PICC line.   We strive to give you quality time with your provider. You may need to reschedule your appointment if you arrive late (15 or more minutes).  Arriving late affects you and other patients whose appointments are after yours.  Also, if you miss three or more appointments without notifying the office, you may be dismissed from the clinic at the provider's discretion.      For prescription refill requests, have your pharmacy contact our office and allow 72 hours for refills to be completed.    Today you received the following chemotherapy and/or immunotherapy agents: Pembrolizumab (Keytruda) and Pemetrexed (Alimta)      To help prevent nausea and vomiting after your treatment, we encourage you to take your nausea medication as directed.  BELOW ARE SYMPTOMS THAT SHOULD BE REPORTED IMMEDIATELY: *FEVER GREATER THAN 100.4 F (38 C) OR HIGHER *CHILLS OR SWEATING *NAUSEA AND VOMITING THAT IS NOT CONTROLLED WITH YOUR NAUSEA MEDICATION *UNUSUAL SHORTNESS OF BREATH *UNUSUAL BRUISING OR BLEEDING *URINARY PROBLEMS (pain or burning when urinating, or frequent urination) *BOWEL PROBLEMS (unusual diarrhea, constipation, pain near the anus) TENDERNESS IN MOUTH AND THROAT WITH OR WITHOUT PRESENCE OF ULCERS (sore throat, sores in mouth, or a toothache) UNUSUAL RASH, SWELLING OR PAIN  UNUSUAL VAGINAL DISCHARGE OR ITCHING   Items with * indicate a potential emergency and should be followed up as soon as possible or go to the Emergency Department if any problems should occur.  Please show the  CHEMOTHERAPY ALERT CARD or IMMUNOTHERAPY ALERT CARD at check-in to the Emergency Department and triage nurse.  Should you have questions after your visit or need to cancel or reschedule your appointment, please contact [facility]  Dept: [phone]  and follow the prompts.  Office hours are 8:00 a.m. to 4:30 p.m. Monday - Friday. Please note that voicemails left after 4:00 p.m. may not be returned until the following business day.  We are closed weekends and major holidays. You have access to a nurse at all times for urgent questions. Please call the main number to the clinic Dept: [phone] and follow the prompts.   For any non-urgent questions, you may also contact your provider using MyChart. We now offer e-Visits for anyone 12 and older to request care online for non-urgent symptoms. For details visit [email or web address].   Also download the MyChart app! Go to the app store, search "MyChart", open the app, select [facility], and log in with your MyChart username and password.

## 2023-08-31 NOTE — Progress Notes (Signed)
 [facility] [phone]) [phone]   [phone]  OFFICE PROGRESS NOTE  [name], [name] [address] [place] [address]  DIAGNOSIS:  Stage IV (T1c, N0, M1b) Non-Small Cell Lung Cancer, adenocarcinoma. She presented with a spiculated left upper lobe lung nodule and large right adrenal gland mass. She was diagnosed in July 2024. She had molecular studies that showed she has KRASG12C which can be used in the second line setting.    PDL1: 1%   PRIOR THERAPY: SBRT to the left upper lobe and right adrenal targets under the care of Dr. Mitzi Hansen.  Last dose of treatment expected on 02/20/2023   CURRENT THERAPY: Systemic chemotherapy with carboplatin for AUC of 5, Alimta 500 Mg/M2 and Keytruda 200 Mg IV every 3 weeks.  First dose April 05, 2023.Starting cycle #5 she has been on maintenance treatment with Alimta and Keytruda every 3 weeks.  Status post 7 cycles  INTERVAL HISTORY: Erin Pacheco 73 y.o. female returns to the clinic today for follow-up visit accompanied by her daughter. Discussed the use of AI scribe software for clinical note transcription with the patient, who gave verbal consent to proceed.  History of Present Illness   Erin Pacheco is a 73 year old female with stage four non-small cell lung cancer who presents for chemotherapy follow-up. She is accompanied by her daughter.  She has stage four non-small cell lung cancer, adenocarcinoma subtype, with a positive KRAS G12C mutation and PD-L1 expression of one percent, diagnosed in July 2024. She is currently undergoing Systemic chemotherapy with carboplatin for AUC of 5, Alimta 500 Mg/M2 and Keytruda 200 Mg IV every 3 weeks.  First dose April 05, 2023. Starting Cycle #5 she has been on maintenance treatment with Alimta and Keytruda every 3 weeks.  Status post 7 cycles  and is on her eighth cycle of treatment.  She experiences swelling in her feet and darkening of her skin,  which she attributes to the chemotherapy. No nausea, vomiting, chest pain, or breathing issues.  She has mild anemia with a hemoglobin level of 9.9 and is taking an iron supplement daily. Her blood work is stable for continuing treatment.      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  MEDICATIONS:  Current Outpatient Medications  Medication Sig Dispense Refill   acetaminophen (TYLENOL) 325 MG tablet Take 2 tablets (650 mg total) by mouth every 6 (six) hours as needed for mild pain (or Fever >/= 101).     apixaban (ELIQUIS) 5 MG TABS tablet Take 1 tablet (5 mg total) by mouth 2 (two) times daily. 60 tablet 5   apixaban (ELIQUIS) 5 MG TABS tablet Take 1 tablet (5 mg total) by mouth 2 (two) times daily. 60 tablet 3   apixaban (ELIQUIS) 5 MG TABS tablet Take 1 tablet (5 mg total) by mouth 2 (two) times daily. 42 tablet 0   Fluticasone-Umeclidin-Vilant (TRELEGY ELLIPTA) 100-62.5-25 MCG/ACT AEPB Inhale 1 each into the lungs daily.     folic acid (FOLVITE) 1 MG tablet Take 1 tablet (1 mg total) by mouth daily. Start 7 days before pemetrexed chemotherapy. Continue until 16  days after pemetrexed completed. 100 tablet 3   lidocaine-prilocaine (EMLA) cream APPLY TO THE PORT-A-CATH SITE 30 MINUTES BEFORE TREATMENT. 30 g 0   metoprolol succinate (TOPROL-XL) 50 MG 24 hr tablet TAKE 1 TABLET BY MOUTH DAILY. TAKE WITH OR IMMEDIATELY FOLLOWING A MEAL. 60 tablet 5   Multiple Vitamin (MULTIVITAMIN) tablet Take 1 tablet by mouth daily.       ondansetron (ZOFRAN) 8 MG tablet Take 1 tablet (8 mg total) by mouth every 8 (eight) hours as needed for nausea or vomiting. 30 tablet 0   oxyCODONE (OXY IR/ROXICODONE) 5 MG immediate release  tablet Take 1 tablet (5 mg total) by mouth every 6 (six) hours as needed for moderate pain. 60 tablet 0   pantoprazole (PROTONIX) 40 MG tablet Take 1 tablet (40 mg total) by mouth daily before breakfast. 30 tablet 1   Polyethyl Glycol-Propyl Glycol (SYSTANE OP) Place 1 drop into both eyes daily as needed (dry eye).     prochlorperazine (COMPAZINE) 10 MG tablet Take 1 tablet (10 mg total) by mouth every 6 (six) hours as needed for nausea or vomiting. 30 tablet 1   sacubitril-valsartan (ENTRESTO) 49-51 MG TAKE 1 TABLET BY MOUTH TWICE A DAY 60 tablet 11   spironolactone (ALDACTONE) 25 MG tablet Take 0.5 tablets (12.5 mg total) by mouth daily. 30 tablet 1   No current facility-administered medications for this visit.   Facility-Administered Medications Ordered in Other Visits  Medication Dose Route Frequency Provider Last Rate Last Admin   heparin lock flush 100 unit/mL  500 Units Intracatheter Once Si Gaul, MD       sodium chloride flush (NS) 0.9 % injection 10 mL  10 mL Intracatheter Once Si Gaul, MD        SURGICAL HISTORY:  Past Surgical History:  Procedure Laterality Date   ABDOMINAL HYSTERECTOMY     BILATERAL OOPHORECTOMY     BIOPSY  12/09/2022   Procedure: BIOPSY;  Surgeon: Imogene Burn, MD;  Location: [facility] ENDOSCOPY;  Service: Gastroenterology;;   BRONCHIAL BIOPSY  01/09/2023   Procedure: BRONCHIAL BIOPSIES;  Surgeon: Josephine Igo, DO;  Location: [facility] ENDOSCOPY;  Service: Pulmonary;;   BRONCHIAL NEEDLE ASPIRATION BIOPSY  01/09/2023   Procedure: BRONCHIAL NEEDLE ASPIRATION BIOPSIES;  Surgeon: Josephine Igo, DO;  Location: [facility] ENDOSCOPY;  Service: Pulmonary;;   CATARACT EXTRACTION W/PHACO  12/27/2010   Procedure: CATARACT EXTRACTION PHACO AND INTRAOCULAR LENS PLACEMENT (IOC);  Surgeon: Gemma Payor;  Location: [facility] ORS;  Service: Ophthalmology;  Laterality: Right;   CATARACT EXTRACTION W/PHACO  03/28/2011   Procedure: CATARACT EXTRACTION PHACO AND INTRAOCULAR LENS PLACEMENT  (IOC);  Surgeon: Gemma Payor;  Location: [facility] ORS;  Service: Ophthalmology;  Laterality: Left;  CDE 7.27   ENTEROSCOPY N/A 02/28/2023   Procedure: ENTEROSCOPY;  Surgeon: Iva Boop, MD;  Location: [facility] ENDOSCOPY;  Service: Gastroenterology;  Laterality: N/A;   ESOPHAGOGASTRODUODENOSCOPY (EGD) WITH PROPOFOL N/A 12/09/2022   Procedure: ESOPHAGOGASTRODUODENOSCOPY (EGD) WITH PROPOFOL;  Surgeon: Imogene Burn, MD;  Location: [facility] ENDOSCOPY;  Service: Gastroenterology;  Laterality: N/A;   HOT HEMOSTASIS N/A 02/28/2023   Procedure: HOT HEMOSTASIS (ARGON PLASMA COAGULATION/BICAP);  Surgeon: Iva Boop, MD;  Location: [facility];  Service: Gastroenterology;  Laterality: N/A;   IR IMAGING GUIDED PORT INSERTION  03/23/2023   SUBMUCOSAL TATTOO INJECTION  02/28/2023   Procedure: SUBMUCOSAL TATTOO INJECTION;  Surgeon: Iva Boop, MD;  Location: [facility] ENDOSCOPY;  Service: Gastroenterology;;    REVIEW OF SYSTEMS:  A comprehensive review of systems was  negative except for: Constitutional: positive for fatigue   PHYSICAL EXAMINATION: General appearance: alert, cooperative, fatigued, and no distress Head: Normocephalic, without obvious abnormality, atraumatic Neck: no adenopathy, no JVD, supple, symmetrical, trachea midline, and thyroid not enlarged, symmetric, no tenderness/mass/nodules Lymph nodes: Cervical, supraclavicular, and axillary nodes normal. Resp: clear to auscultation bilaterally Back: symmetric, no curvature. ROM normal. No CVA tenderness. Cardio: regular rate and rhythm, S1, S2 normal, no murmur, click, rub or gallop GI: soft, non-tender; bowel sounds normal; no masses,  no organomegaly Extremities: extremities normal, atraumatic, no cyanosis or edema  ECOG PERFORMANCE STATUS: 1 - Symptomatic but completely ambulatory  Blood pressure 124/68, pulse 66, temperature 98.5 F (36.9 C), temperature source Temporal, resp. rate 16, height 5\' 3"  (1.6 m), weight 138 lb 14.4 oz (63 kg), SpO2  100%.  LABORATORY DATA: Lab Results  Component Value Date   WBC 4.3 08/31/2023   HGB 9.9 (L) 08/31/2023   HCT 30.8 (L) 08/31/2023   MCV 102.7 (H) 08/31/2023   PLT 280 08/31/2023      Chemistry      Component Value Date/Time   NA 138 08/10/2023 1456   K 4.3 08/10/2023 1456   CL 106 08/10/2023 1456   CO2 30 08/10/2023 1456   BUN 23 08/10/2023 1456   CREATININE 1.26 (H) 08/10/2023 1456      Component Value Date/Time   CALCIUM 8.9 08/10/2023 1456   ALKPHOS 163 (H) 08/10/2023 1456   AST 44 (H) 08/10/2023 1456   ALT 31 08/10/2023 1456   BILITOT 0.3 08/10/2023 1456       RADIOGRAPHIC STUDIES: ECHOCARDIOGRAM COMPLETE Result Date: 08/21/2023    ECHOCARDIOGRAM REPORT   Patient Name:   Erin Pacheco Date of Exam: 08/21/2023 Medical Rec #:  [id number]        Height:       63.0 in Accession #:    [id number]       Weight:       135.4 lb Date of Birth:  1950/10/08        BSA:          1.638 m Patient Age:    72 years         BP:           108/64 mmHg Patient Gender: F                HR:           69 bpm. [facility] Procedure: 2D Echo, Cardiac Doppler and Color Doppler (Both Spectral and Color            Flow Doppler were utilized during procedure).                                MODIFIED REPORT: This report was modified by Dietrich Pates MD on 08/21/2023 due to Complete report.  Indications:     LV dysfunction  History:         Patient has prior history of Echocardiogram examinations, most                  recent 11/16/2022.  Sonographer:     Harriette Bouillon RDCS Referring Phys:  [id number] HAO MENG Diagnosing Phys: Dietrich Pates MD IMPRESSIONS  1. Very difficult acoustic window 2chamber view extremely foreshortened. Overall LVEF is depressed at approximately 30% with diffuse hypokinesis, septal akenesis. There is mild concentric left ventricular hypertrophy. Indeterminate diastolic  filling due  to E-A fusion.  2. Right ventricular systolic function is low normal. The right ventricular size is  normal.  3. A small pericardial effusion is present.  4. Trivial mitral valve regurgitation.  5. The aortic valve is tricuspid. Aortic valve regurgitation is not visualized. Aortic valve sclerosis/calcification is present, without any evidence of aortic stenosis.  6. Turbulent continuous flow seen in IVC IVC is normal in size and compresses. Would recomm abdominal USN to start to evaluate further . The inferior vena cava is normal in size with greater than 50% respiratory variability, suggesting right atrial pressure of 3 mmHg. Comparison(s): The left ventricular function is unchanged. FINDINGS  Left Ventricle: Very difficult acoustic window 2chamber view extremely foreshortened. Overall LVEF is depressed at approximately 30% with diffuse hypokinesis, septal akenesis. The left ventricular internal cavity size was normal in size. There is mild concentric left ventricular hypertrophy. Indeterminate diastolic filling due to E-A fusion. Right Ventricle: The right ventricular size is normal. Right vetricular wall thickness was not assessed. Right ventricular systolic function is low normal. Left Atrium: Left atrial size was normal in size. Right Atrium: Right atrial size was normal in size. Pericardium: A small pericardial effusion is present. Mitral Valve: There is mild thickening of the mitral valve leaflet(s). Trivial mitral valve regurgitation. Tricuspid Valve: The tricuspid valve is normal in structure. Tricuspid valve regurgitation is mild. Aortic Valve: The aortic valve is tricuspid. Aortic valve regurgitation is not visualized. Aortic valve sclerosis/calcification is present, without any evidence of aortic stenosis. Aortic valve mean gradient measures 3.0 mmHg. Aortic valve peak gradient measures 5.5 mmHg. Aortic valve area, by VTI measures 2.02 cm. Pulmonic Valve: The pulmonic valve was not well visualized. Pulmonic valve regurgitation is not visualized. No evidence of pulmonic stenosis. Aorta: The aortic root  and ascending aorta are structurally normal, with no evidence of dilitation. Venous: Turbulent continuous flow seen in IVC IVC is normal in size and compresses. Would recomm abdominal USN to start to evaluate further. The inferior vena cava is normal in size with greater than 50% respiratory variability, suggesting right atrial pressure of 3 mmHg. IAS/Shunts: No atrial level shunt detected by color flow Doppler.  LEFT VENTRICLE PLAX 2D LVIDd:         4.10 cm LVIDs:         3.80 cm LV PW:         1.30 cm LV IVS:        1.40 cm LVOT diam:     2.10 cm LV SV:         42 LV SV Index:   26 LVOT Area:     3.46 cm  IVC IVC diam: 1.00 cm LEFT ATRIUM           Index LA diam:      3.40 cm 2.08 cm/m LA Vol (A4C): 26.7 ml 16.30 ml/m  AORTIC VALVE AV Area (Vmax):    2.20 cm AV Area (Vmean):   2.23 cm AV Area (VTI):     2.02 cm AV Vmax:           117.00 cm/s AV Vmean:          76.600 cm/s AV VTI:            0.207 m AV Peak Grad:      5.5 mmHg AV Mean Grad:      3.0 mmHg LVOT Vmax:         74.40 cm/s LVOT Vmean:  49.400 cm/s LVOT VTI:          0.121 m LVOT/AV VTI ratio: 0.58  AORTA Ao Root diam: 2.70 cm Ao Asc diam:  3.00 cm MITRAL VALVE MV Area (PHT): 3.06 cm    SHUNTS MV Decel Time: 248 msec    Systemic VTI:  0.12 m MV E velocity: 55.70 cm/s  Systemic Diam: 2.10 cm MV A velocity: 81.00 cm/s MV E/A ratio:  0.69 Dietrich Pates MD Electronically signed by Dietrich Pates MD Signature Date/Time: 08/21/2023/2:31:13 PM    Final (Updated)    CT CHEST ABDOMEN PELVIS W CONTRAST Result Date: 08/09/2023 CLINICAL DATA:  Lung cancer restaging * Tracking Code: BO * EXAM: CT CHEST, ABDOMEN, AND PELVIS WITH CONTRAST TECHNIQUE: Multidetector CT imaging of the chest, abdomen and pelvis was performed following the standard protocol during bolus administration of intravenous contrast. RADIATION DOSE REDUCTION: This exam was performed according to the departmental dose-optimization program which includes automated exposure control, adjustment of  the mA and/or kV according to patient size and/or use of iterative reconstruction technique. CONTRAST:  OMNIPAQUE IOHEXOL 300 MG/ML  SOLN COMPARISON:  05/30/2023 FINDINGS: CT CHEST FINDINGS Cardiovascular: Right chest port catheter. Aortic atherosclerosis. Normal heart size. Three-vessel coronary artery calcifications. No pericardial effusion. Mediastinum/Nodes: No enlarged mediastinal, hilar, or axillary lymph nodes. Thyroid gland, trachea, and esophagus demonstrate no significant findings. Lungs/Pleura: Slightly diminished size of a spiculated nodule of the medial suprahilar left upper lobe measuring 1.4 x 1.1 cm, previously 1.7 x 1.3 cm (series 4, image 45). Unchanged 0.6 cm nodule of the anterior lingula (series 4, image 79). Severe centrilobular emphysema. Bandlike scarring and volume loss of the right lung base. No pleural effusion or pneumothorax. Musculoskeletal: No chest wall abnormality. No acute osseous findings. CT ABDOMEN PELVIS FINDINGS Hepatobiliary: No solid liver abnormality is seen. Contracted gallbladder. No gallstones, gallbladder wall thickening, or biliary dilatation. Pancreas: Unremarkable. No pancreatic ductal dilatation or surrounding inflammatory changes. Spleen: Normal in size without significant abnormality. Adrenals/Urinary Tract: Slightly diminished size of a hypodense right adrenal mass measuring 6.0 x 3.3 cm, previously 6.4 x 3.9 cm (series 2, image 54). Normal left adrenal. Kidneys are normal, without renal calculi, solid lesion, or hydronephrosis. Bladder is unremarkable. Stomach/Bowel: Stomach is within normal limits. Appendix not clearly visualized. No evidence of bowel wall thickening, distention, or inflammatory changes. Vascular/Lymphatic: Severe aortic atherosclerosis. No enlarged abdominal or pelvic lymph nodes. Reproductive: Status post hysterectomy. Other: Small bilateral fat containing inguinal hernias.  No ascites. Musculoskeletal: No acute osseous findings.  IMPRESSION: 1. Slightly diminished size of a spiculated nodule of the medial suprahilar left upper lobe, consistent with treatment response 2. Unchanged 0.6 cm nodule of the anterior lingula. Attention on follow-up. 3. Slightly diminished size of a hypodense right adrenal mass, consistent with treatment response. 4. No evidence of new metastatic disease in the chest, abdomen, or pelvis. 5. Emphysema. 6. Coronary artery disease. Aortic Atherosclerosis (ICD10-I70.0). Electronically Signed   By: Jearld Lesch M.D.   On: 08/09/2023 16:29     ASSESSMENT AND PLAN: This is a very pleasant 73 years old African-American female recently diagnosed with a stage IV (T1c, N0, M1b) non-small cell lung cancer, adenocarcinoma with positive KRAS G12C mutation and PD-L1 expression of 1%.  She started systemic chemotherapy with carboplatin for AUC of 5, Alimta 500 Mg/M2 and Keytruda 200 Mg IV every 3 weeks.  She is status post 7 cycle of her treatment. She has been tolerating her treatment fairly well with no concerning adverse effects.  Stage IV non-small cell lung cancer, adenocarcinoma with KRAS G12C mutation Stage IV non-small cell lung cancer, adenocarcinoma with KRAS G12C mutation and PD-L1 expression of 1%, diagnosed in July 2024. Currently undergoing Systemic chemotherapy with carboplatin for AUC of 5, Alimta 500 Mg/M2 and Keytruda 200 Mg IV every 3 weeks.  First dose April 05, 2023. Starting cycle #5 she has been on maintenance treatment with Alimta and Keytruda every 3 weeks.  Status post 7 cycles cycle 8. Reports skin hyperpigmentation and pedal edema, likely chemotherapy-related. No nausea, vomiting, chest pain, or dyspnea. Blood work shows mild anemia with hemoglobin at 9.9, acceptable for treatment. Informed consent includes discussion of skin hyperpigmentation and edema as potential chemotherapy side effects. - Administer chemotherapy today - Continue daily iron supplementation - Use acetaminophen as  needed for pedal pain - Schedule next chemotherapy session in 3 weeks  Mild anemia Mild anemia with hemoglobin at 9.9. She is taking iron supplements daily. - Continue daily iron supplementation   The patient was advised to call immediately if she has any concerning symptoms in the interval. The patient voices understanding of current disease status and treatment options and is in agreement with the current care plan.  All questions were answered. The patient knows to call the clinic with any problems, questions or concerns. We can certainly see the patient much sooner if necessary. The total time spent in the appointment was 20 minutes.  Disclaimer: This note was dictated with voice recognition software. Similar sounding words can inadvertently be transcribed and may not be corrected upon review.

## 2023-09-01 ENCOUNTER — Other Ambulatory Visit: Payer: Self-pay | Admitting: Internal Medicine

## 2023-09-06 NOTE — Telephone Encounter (Signed)
 This Patient's insurance does not require a PA for Lidocaine-Prilocaine Cream

## 2023-09-09 ENCOUNTER — Other Ambulatory Visit: Payer: Self-pay | Admitting: Physician Assistant

## 2023-09-11 ENCOUNTER — Telehealth: Payer: Self-pay | Admitting: Physician Assistant

## 2023-09-11 ENCOUNTER — Telehealth: Payer: Self-pay | Admitting: Medical Oncology

## 2023-09-11 NOTE — Telephone Encounter (Signed)
 Lower Ext Swelling- " Rough weekend" Dtr called to report pt ankles  swollen L >R . She takes aldactone and I instructed her to keep legs elevated. Has strong cardiac history.    LVM for dtr to contact cardiologist re swelling/leg pain.  Burning in her feet.    Please advise.

## 2023-09-11 NOTE — Telephone Encounter (Signed)
 Scheduled appointment per the patients request. The patient and family are aware of the appointment details.

## 2023-09-12 ENCOUNTER — Telehealth: Payer: Self-pay | Admitting: Cardiology

## 2023-09-12 DIAGNOSIS — Z79899 Other long term (current) drug therapy: Secondary | ICD-10-CM

## 2023-09-12 MED ORDER — SPIRONOLACTONE 25 MG PO TABS: 12.5000 mg | ORAL_TABLET | Freq: Every day | ORAL | 3 refills | Status: AC

## 2023-09-12 NOTE — Telephone Encounter (Signed)
 Let's start 12.5mg  daily of spironolactone to help with swelling. She used to be on this medication before and had to stop it due to low blood pressure, but recent blood pressure has improved. This medication will help with her left ventricular dysfunction as well. Need BMET in 1-2 weeks after the start of spironolactone to make sure potassium level is stable.   Erin Pacheco

## 2023-09-12 NOTE — Telephone Encounter (Signed)
 Please see documentation in 4/8 telephone encounter

## 2023-09-12 NOTE — Telephone Encounter (Signed)
 [place], [place], PA  Grifton, Diane H, RN; Cv Div Nl Triage1 minute ago (4:46 PM)    Let's start 12.5mg  daily of spironolactone to help with swelling. She used to be on this medication before and had to stop it due to low blood pressure, but recent blood pressure has improved. This medication will help with her left ventricular dysfunction as well. Need BMET in 1-2 weeks after the start of spironolactone to make sure potassium level is stable.    Hao        Patient identification verified by 2 forms. Marilynn Rail, RN    Called and spoke to patients daughter Hyman Bower provider message  Reviewed Rx instruction/education  [name] aware:  -Rx sent to preferred pharmacy  -to present to lab in 1 week for BMET  [name] verbalized understanding, no questions at this time

## 2023-09-12 NOTE — Telephone Encounter (Signed)
 Pt c/o swelling/edema: STAT if pt has developed SOB within 24 hours  If swelling, where is the swelling located? Swelling in both ankles and feet  How much weight have you gained and in what time span? N/A  Have you gained 2 pounds in a day or 5 pounds in a week? N/A  Do you have a log of your daily weights (if so, list)? No   Are you currently taking a fluid pill? No   Are you currently SOB? No  Have you traveled recently in a car or plane for an extended period of time? No

## 2023-09-12 NOTE — Telephone Encounter (Signed)
 Patient identification verified by 2 forms. Erin Rail, RN    Called and spoke to patients daughter Erin Pacheco states:   -patient had swelling in ankles and legs over the weekend   -Dr. Arbutus Ped, Oncologist would like Dr. Antoine Poche to review/prescribe diuretic   -due to swelling patient had pain with walking over the weekend   -swelling is likely related to oncology treatment   -since the weekend swelling has decreased a bit   -patient was previously on spironolactone but that was discontinued   -does not currently have BP readings for patient  -Typically BP in the ~110-120/60-80  Erin Pacheco denies:   -SOB/difficulty breathing  Per chart review 2/4 OV spironolactone d/c due to Hypotension  Informed Erin Pacheco message sent to Dr. Antoine Poche for input/advisement  Erin Pacheco verbalized understanding, no questions at this time

## 2023-09-14 ENCOUNTER — Other Ambulatory Visit

## 2023-09-14 ENCOUNTER — Ambulatory Visit: Admitting: Physician Assistant

## 2023-09-18 ENCOUNTER — Ambulatory Visit: Admitting: Physician Assistant

## 2023-09-21 ENCOUNTER — Inpatient Hospital Stay: Payer: Medicare HMO

## 2023-09-21 ENCOUNTER — Inpatient Hospital Stay: Payer: Medicare HMO | Attending: Physician Assistant | Admitting: Internal Medicine

## 2023-09-21 VITALS — BP 100/64 | HR 68 | Temp 98.3°F | Resp 16 | Ht 63.0 in | Wt 135.2 lb

## 2023-09-21 DIAGNOSIS — C3412 Malignant neoplasm of upper lobe, left bronchus or lung: Secondary | ICD-10-CM | POA: Diagnosis not present

## 2023-09-21 DIAGNOSIS — Z5111 Encounter for antineoplastic chemotherapy: Secondary | ICD-10-CM | POA: Insufficient documentation

## 2023-09-21 DIAGNOSIS — Z95828 Presence of other vascular implants and grafts: Secondary | ICD-10-CM

## 2023-09-21 DIAGNOSIS — C3490 Malignant neoplasm of unspecified part of unspecified bronchus or lung: Secondary | ICD-10-CM | POA: Diagnosis not present

## 2023-09-21 DIAGNOSIS — C7971 Secondary malignant neoplasm of right adrenal gland: Secondary | ICD-10-CM | POA: Insufficient documentation

## 2023-09-21 DIAGNOSIS — Z79620 Long term (current) use of immunosuppressive biologic: Secondary | ICD-10-CM | POA: Insufficient documentation

## 2023-09-21 DIAGNOSIS — T451X5A Adverse effect of antineoplastic and immunosuppressive drugs, initial encounter: Secondary | ICD-10-CM

## 2023-09-21 DIAGNOSIS — R918 Other nonspecific abnormal finding of lung field: Secondary | ICD-10-CM

## 2023-09-21 DIAGNOSIS — Z5112 Encounter for antineoplastic immunotherapy: Secondary | ICD-10-CM | POA: Diagnosis present

## 2023-09-21 LAB — SAMPLE TO BLOOD BANK

## 2023-09-21 LAB — CBC WITH DIFFERENTIAL (CANCER CENTER ONLY)
Abs Immature Granulocytes: 0.03 10*3/uL (ref 0.00–0.07)
Basophils Absolute: 0 10*3/uL (ref 0.0–0.1)
Basophils Relative: 1 %
Eosinophils Absolute: 0 10*3/uL (ref 0.0–0.5)
Eosinophils Relative: 1 %
HCT: 30.8 % — ABNORMAL LOW (ref 36.0–46.0)
Hemoglobin: 10.3 g/dL — ABNORMAL LOW (ref 12.0–15.0)
Immature Granulocytes: 1 %
Lymphocytes Relative: 25 %
Lymphs Abs: 1.3 10*3/uL (ref 0.7–4.0)
MCH: 33.3 pg (ref 26.0–34.0)
MCHC: 33.4 g/dL (ref 30.0–36.0)
MCV: 99.7 fL (ref 80.0–100.0)
Monocytes Absolute: 0.9 10*3/uL (ref 0.1–1.0)
Monocytes Relative: 18 %
Neutro Abs: 2.8 10*3/uL (ref 1.7–7.7)
Neutrophils Relative %: 54 %
Platelet Count: 321 10*3/uL (ref 150–400)
RBC: 3.09 MIL/uL — ABNORMAL LOW (ref 3.87–5.11)
RDW: 15.8 % — ABNORMAL HIGH (ref 11.5–15.5)
WBC Count: 5.1 10*3/uL (ref 4.0–10.5)
nRBC: 0.4 % — ABNORMAL HIGH (ref 0.0–0.2)

## 2023-09-21 LAB — CMP (CANCER CENTER ONLY)
ALT: 24 U/L (ref 0–44)
AST: 36 U/L (ref 15–41)
Albumin: 3.3 g/dL — ABNORMAL LOW (ref 3.5–5.0)
Alkaline Phosphatase: 138 U/L — ABNORMAL HIGH (ref 38–126)
Anion gap: 3 — ABNORMAL LOW (ref 5–15)
BUN: 35 mg/dL — ABNORMAL HIGH (ref 8–23)
CO2: 29 mmol/L (ref 22–32)
Calcium: 9.2 mg/dL (ref 8.9–10.3)
Chloride: 107 mmol/L (ref 98–111)
Creatinine: 1.26 mg/dL — ABNORMAL HIGH (ref 0.44–1.00)
GFR, Estimated: 45 mL/min — ABNORMAL LOW (ref >60)
Glucose, Bld: 82 mg/dL (ref 70–99)
Potassium: 4.3 mmol/L (ref 3.5–5.1)
Sodium: 139 mmol/L (ref 135–145)
Total Bilirubin: 0.3 mg/dL (ref 0.0–1.2)
Total Protein: 8.1 g/dL (ref 6.5–8.1)

## 2023-09-21 LAB — TSH: TSH: 1.035 u[IU]/mL (ref 0.350–4.500)

## 2023-09-21 MED ORDER — SODIUM CHLORIDE 0.9% FLUSH: 10.0000 mL | INTRAVENOUS | Status: DC | PRN

## 2023-09-21 MED ORDER — HEPARIN SOD (PORK) LOCK FLUSH 100 UNIT/ML IV SOLN: 500.0000 [IU] | Freq: Once | INTRAVENOUS | Status: DC | PRN

## 2023-09-21 MED ORDER — SODIUM CHLORIDE 0.9 % IV SOLN
200.0000 mg | Freq: Once | INTRAVENOUS | Status: AC
  Administered 2023-09-21: 200 mg via INTRAVENOUS
  Filled 2023-09-21: qty 200

## 2023-09-21 MED ORDER — SODIUM CHLORIDE 0.9 % IV SOLN
500.0000 mg/m2 | Freq: Once | INTRAVENOUS | Status: AC
  Administered 2023-09-21: 800 mg via INTRAVENOUS
  Filled 2023-09-21: qty 20

## 2023-09-21 MED ORDER — SODIUM CHLORIDE 0.9 % IV SOLN: Freq: Once | INTRAVENOUS | Status: AC

## 2023-09-21 MED ORDER — SODIUM CHLORIDE 0.9% FLUSH
10.0000 mL | Freq: Once | INTRAVENOUS | Status: AC
Start: 2023-09-21 — End: 2023-09-21
  Administered 2023-09-21: 10 mL

## 2023-09-21 MED ORDER — CYANOCOBALAMIN 1000 MCG/ML IJ SOLN
1000.0000 ug | Freq: Once | INTRAMUSCULAR | Status: AC
  Administered 2023-09-21: 1000 ug via INTRAMUSCULAR
  Filled 2023-09-21: qty 1

## 2023-09-21 MED ORDER — PROCHLORPERAZINE MALEATE 10 MG PO TABS
10.0000 mg | ORAL_TABLET | Freq: Once | ORAL | Status: AC
  Administered 2023-09-21: 10 mg via ORAL
  Filled 2023-09-21: qty 1

## 2023-09-21 NOTE — Progress Notes (Signed)
 [facility] [phone]   [phone]) [phone]  OFFICE PROGRESS NOTE  Erin Canary, [name] [address] [place] [address]  DIAGNOSIS:  Stage IV (T1c, N0, M1b) Non-Small Cell Lung Cancer, adenocarcinoma. She presented with a spiculated left upper lobe lung nodule and large right adrenal gland mass. She was diagnosed in July 2024. She had molecular studies that showed she has KRASG12C which can be used in the second line setting.    PDL1: 1%   PRIOR THERAPY: SBRT to the left upper lobe and right adrenal targets under the care of Dr. Mitzi Hansen.  Last dose of treatment expected on 02/20/2023   CURRENT THERAPY: Systemic chemotherapy with carboplatin for AUC of 5, Alimta 500 Mg/M2 and Keytruda 200 Mg IV every 3 weeks.  First dose April 05, 2023.Starting cycle #5 she has been on maintenance treatment with Alimta and Keytruda every 3 weeks.  Status post 8 cycles  INTERVAL HISTORY: Erin Pacheco 73 y.o. female returns to the clinic today for follow-up visit accompanied by her daughter.  Discussed the use of AI scribe software for clinical note transcription with the patient, who gave verbal consent to proceed.  History of Present Illness   Erin Pacheco is a 73 year old female with adenocarcinoma who presents for evaluation before starting cycle number nine of chemotherapy.  She has adenocarcinoma with a G12C mutation and has been receiving treatment with Alimta and Keytruda every three weeks. She has completed eight cycles of treatment and is here for evaluation before starting the ninth cycle.  Three weeks ago, she experienced significant bilateral foot swelling, which she attributes to the chemotherapy. The swelling was severe enough to prevent her from wearing her tennis shoes, but it has improved significantly since then. She manages the swelling by elevating her legs on the sofa most of the time.  She denies consuming a lot of salt and  states that she buys no-salt vegetables, although she acknowledges that some foods might still contain a little sodium. No nausea or vomiting related to the chemotherapy, but she did vomit recently, which she attributes to irritation from her son's smoking.  No chest pain or breathing issues.      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  MEDICATIONS:  Current Outpatient Medications  Medication Sig Dispense Refill   acetaminophen (TYLENOL) 325 MG tablet Take 2 tablets (650 mg total) by mouth every 6 (six) hours as needed for mild pain (or Fever >/= 101).     apixaban (ELIQUIS) 5 MG TABS tablet Take 1 tablet (5 mg total) by mouth 2 (two) times daily. 60 tablet 3   apixaban (ELIQUIS) 5 MG TABS tablet Take 1 tablet (5 mg total) by mouth 2 (two) times daily. 42 tablet 0   Fluticasone-Umeclidin-Vilant (TRELEGY ELLIPTA) 100-62.5-25 MCG/ACT AEPB Inhale 1 each into the lungs daily.     folic acid (FOLVITE) 1 MG tablet Take 1 tablet (1 mg total) by mouth daily. Start 7 days before pemetrexed chemotherapy. Continue until 21 days after pemetrexed completed. 100 tablet 3   lidocaine-prilocaine (EMLA) cream APPLY TO THE PORT-A-CATH SITE 30 MINUTES BEFORE TREATMENT.  30 g 0   metoprolol succinate (TOPROL-XL) 50 MG 24 hr tablet TAKE 1 TABLET BY MOUTH EVERY DAY WITH OR IMMEDIATELY FOLLOWING A MEAL 90 tablet 3   Multiple Vitamin (MULTIVITAMIN) tablet Take 1 tablet by mouth daily.       ondansetron (ZOFRAN) 8 MG tablet Take 1 tablet (8 mg total) by mouth every 8 (eight) hours as needed for nausea or vomiting. 30 tablet 0   oxyCODONE (OXY IR/ROXICODONE) 5 MG immediate release tablet Take 1 tablet (5 mg total) by mouth every 6 (six)  hours as needed for moderate pain. 60 tablet 0   pantoprazole (PROTONIX) 40 MG tablet Take 1 tablet (40 mg total) by mouth daily before breakfast. 30 tablet 1   Polyethyl Glycol-Propyl Glycol (SYSTANE OP) Place 1 drop into both eyes daily as needed (dry eye).     prochlorperazine (COMPAZINE) 10 MG tablet Take 1 tablet (10 mg total) by mouth every 6 (six) hours as needed for nausea or vomiting. 30 tablet 1   sacubitril-valsartan (ENTRESTO) 49-51 MG TAKE 1 TABLET BY MOUTH TWICE A DAY 60 tablet 11   spironolactone (ALDACTONE) 25 MG tablet Take 0.5 tablets (12.5 mg total) by mouth daily. Take half a tablet once daily. 45 tablet 3   No current facility-administered medications for this visit.   Facility-Administered Medications Ordered in Other Visits  Medication Dose Route Frequency Provider Last Rate Last Admin   heparin lock flush 100 unit/mL  500 Units Intracatheter Once Marlene Simas, MD       sodium chloride flush (NS) 0.9 % injection 10 mL  10 mL Intracatheter Once Marlene Simas, MD        SURGICAL HISTORY:  Past Surgical History:  Procedure Laterality Date   ABDOMINAL HYSTERECTOMY     BILATERAL OOPHORECTOMY     BIOPSY  12/09/2022   Procedure: BIOPSY;  Surgeon: Daina Drum, MD;  Location: [facility] ENDOSCOPY;  Service: Gastroenterology;;   BRONCHIAL BIOPSY  01/09/2023   Procedure: BRONCHIAL BIOPSIES;  Surgeon: Prudy Brownie, DO;  Location: [facility] ENDOSCOPY;  Service: Pulmonary;;   BRONCHIAL NEEDLE ASPIRATION BIOPSY  01/09/2023   Procedure: BRONCHIAL NEEDLE ASPIRATION BIOPSIES;  Surgeon: Prudy Brownie, DO;  Location: [facility] ENDOSCOPY;  Service: Pulmonary;;   CATARACT EXTRACTION W/PHACO  12/27/2010   Procedure: CATARACT EXTRACTION PHACO AND INTRAOCULAR LENS PLACEMENT (IOC);  Surgeon: Anner Kill;  Location: [facility] ORS;  Service: Ophthalmology;  Laterality: Right;   CATARACT EXTRACTION W/PHACO  03/28/2011   Procedure: CATARACT EXTRACTION PHACO AND INTRAOCULAR LENS PLACEMENT (IOC);  Surgeon: Anner Kill;  Location: [facility] ORS;  Service: Ophthalmology;  Laterality: Left;  CDE 7.27   ENTEROSCOPY N/A 02/28/2023   Procedure: ENTEROSCOPY;  Surgeon: Kenney Peacemaker, MD;  Location: [facility] ENDOSCOPY;  Service: Gastroenterology;  Laterality: N/A;   ESOPHAGOGASTRODUODENOSCOPY (EGD) WITH PROPOFOL N/A 12/09/2022   Procedure: ESOPHAGOGASTRODUODENOSCOPY (EGD) WITH PROPOFOL;  Surgeon: Daina Drum, MD;  Location: [facility] ENDOSCOPY;  Service: Gastroenterology;  Laterality: N/A;   HOT HEMOSTASIS N/A 02/28/2023   Procedure: HOT HEMOSTASIS (ARGON PLASMA COAGULATION/BICAP);  Surgeon: Kenney Peacemaker, MD;  Location: [facility]OS[facility];  Service: Gastroenterology;  Laterality: N/A;   IR IMAGING GUIDED PORT INSERTION  03/23/2023   SUBMUCOSAL TATTOO INJECTION  02/28/2023   Procedure: SUBMUCOSAL TATTOO INJECTION;  Surgeon: Kenney Peacemaker, MD;  Location: [facility] ENDOSCOPY;  Service: Gastroenterology;;    REVIEW OF SYSTEMS:  A comprehensive review of systems was negative except for: Constitutional: positive for fatigue   PHYSICAL EXAMINATION: General appearance: alert, cooperative,  fatigued, and no distress Head: Normocephalic, without obvious abnormality, atraumatic Neck: no adenopathy, no JVD, supple, symmetrical, trachea midline, and thyroid not enlarged, symmetric, no tenderness/mass/nodules Lymph nodes: Cervical, supraclavicular, and axillary nodes normal. Resp: clear to auscultation bilaterally Back: symmetric, no curvature. ROM normal. No CVA tenderness. Cardio: regular rate and rhythm, S1, S2 normal, no murmur, click, rub or gallop GI: soft, non-tender; bowel sounds normal; no masses,  no organomegaly Extremities: extremities normal, atraumatic, no cyanosis or edema  ECOG PERFORMANCE STATUS: 1 - Symptomatic but completely ambulatory  Blood pressure 100/64, pulse 68, temperature 98.3 F (36.8 C), temperature source Temporal, resp. rate 16, height 5\' 3"  (1.6 m), weight 135 lb 3.2 oz (61.3 kg), SpO2 100%.  LABORATORY DATA: Lab  Results  Component Value Date   WBC 4.3 08/31/2023   HGB 9.9 (L) 08/31/2023   HCT 30.8 (L) 08/31/2023   MCV 102.7 (H) 08/31/2023   PLT 280 08/31/2023      Chemistry      Component Value Date/Time   NA 138 08/31/2023 1333   K 4.3 08/31/2023 1333   CL 107 08/31/2023 1333   CO2 29 08/31/2023 1333   BUN 26 (H) 08/31/2023 1333   CREATININE 1.40 (H) 08/31/2023 1333      Component Value Date/Time   CALCIUM 9.0 08/31/2023 1333   ALKPHOS 145 (H) 08/31/2023 1333   AST 36 08/31/2023 1333   ALT 25 08/31/2023 1333   BILITOT 0.3 08/31/2023 1333       RADIOGRAPHIC STUDIES: No results found.    ASSESSMENT AND PLAN: This is a very pleasant 74 years old African-American female recently diagnosed with a stage IV (T1c, N0, M1b) non-small cell lung cancer, adenocarcinoma with positive KRAS G12C mutation and PD-L1 expression of 1%.  She started systemic chemotherapy with carboplatin for AUC of 5, Alimta 500 Mg/M2 and Keytruda 200 Mg IV every 3 weeks.  She is status post 8 cycle of her treatment.    Stage IV (T1c, N0, M1b) Non-Small Cell Lung Cancer, adenocarcinoma. She presented with a spiculated left upper lobe lung nodule and large right adrenal gland mass. She was diagnosed in July 2024. She had molecular studies that showed she has KRASG12C  Adenocarcinoma with G12C mutation She has adenocarcinoma with a KRAS G12C mutation, currently undergoing treatment with Alimta and Keytruda every three weeks. She has completed eight cycles and presents for evaluation prior to initiating cycle nine. Reports mild bilateral pedal edema, a known chemotherapy side effect, which has improved. Denies chemotherapy-induced nausea or vomiting, though experienced emesis due to cigarette smoke exposure. No chest pain or dyspnea reported. Blood work is pending to monitor treatment effects. A scan is scheduled ten days before the next visit to assess treatment efficacy. - Administer Alimta and Keytruda for cycle nine. -  Order blood work to monitor treatment effects. - Schedule a scan ten days before the next visit to assess treatment efficacy. - Advise avoiding tight footwear and elevating legs to manage edema. - Instruct to reduce sodium intake to help with edema.   The patient was advised to call immediately if he has any concerning symptoms in the interval. The patient voices understanding of current disease status and treatment options and is in agreement with the current care plan.  All questions were answered. The patient knows to call the clinic with any problems, questions or concerns. We can certainly see the patient much sooner if necessary. The total time spent in the appointment was 20 minutes.  Disclaimer: This  note was dictated with voice recognition software. Similar sounding words can inadvertently be transcribed and may not be corrected upon review.

## 2023-09-21 NOTE — Patient Instructions (Signed)
 [facility] A DE[facility]  Discharge Instructions: Thank you for choosing [facility] to provide your oncology and hematology care.   If you have a lab appointment with the Cancer Center, please go directly to the Cancer Center and check in at the registration area.   Wear comfortable clothing and clothing appropriate for easy access to any Portacath or PICC line.   We strive to give you quality time with your provider. You may need to reschedule your appointment if you arrive late (15 or more minutes).  Arriving late affects you and other patients whose appointments are after yours.  Also, if you miss three or more appointments without notifying the office, you may be dismissed from the clinic at the provider's discretion.      For prescription refill requests, have your pharmacy contact our office and allow 72 hours for refills to be completed.    Today you received the following chemotherapy and/or immunotherapy agents: Pembrolizumab (Keytruda) and Pemetrexed (Alimta)      To help prevent nausea and vomiting after your treatment, we encourage you to take your nausea medication as directed.  BELOW ARE SYMPTOMS THAT SHOULD BE REPORTED IMMEDIATELY: *FEVER GREATER THAN 100.4 F (38 C) OR HIGHER *CHILLS OR SWEATING *NAUSEA AND VOMITING THAT IS NOT CONTROLLED WITH YOUR NAUSEA MEDICATION *UNUSUAL SHORTNESS OF BREATH *UNUSUAL BRUISING OR BLEEDING *URINARY PROBLEMS (pain or burning when urinating, or frequent urination) *BOWEL PROBLEMS (unusual diarrhea, constipation, pain near the anus) TENDERNESS IN MOUTH AND THROAT WITH OR WITHOUT PRESENCE OF ULCERS (sore throat, sores in mouth, or a toothache) UNUSUAL RASH, SWELLING OR PAIN  UNUSUAL VAGINAL DISCHARGE OR ITCHING   Items with * indicate a potential emergency and should be followed up as soon as possible or go to the Emergency Department if any problems should occur.  Please show the  CHEMOTHERAPY ALERT CARD or IMMUNOTHERAPY ALERT CARD at check-in to the Emergency Department and triage nurse.  Should you have questions after your visit or need to cancel or reschedule your appointment, please contact [facility]  Dept: [phone]  and follow the prompts.  Office hours are 8:00 a.m. to 4:30 p.m. Monday - Friday. Please note that voicemails left after 4:00 p.m. may not be returned until the following business day.  We are closed weekends and major holidays. You have access to a nurse at all times for urgent questions. Please call the main number to the clinic Dept: [phone] and follow the prompts.   For any non-urgent questions, you may also contact your provider using MyChart. We now offer e-Visits for anyone 12 and older to request care online for non-urgent symptoms. For details visit [email or web address].   Also download the MyChart app! Go to the app store, search "MyChart", open the app, select [facility], and log in with your MyChart username and password.

## 2023-09-22 LAB — T4: T4, Total: 9 ug/dL (ref 4.5–12.0)

## 2023-09-27 DIAGNOSIS — Z79899 Other long term (current) drug therapy: Secondary | ICD-10-CM | POA: Diagnosis not present

## 2023-09-28 ENCOUNTER — Encounter: Payer: Self-pay | Admitting: Internal Medicine

## 2023-09-28 LAB — BASIC METABOLIC PANEL WITH GFR
BUN/Creatinine Ratio: 19 (ref 12–28)
BUN: 26 mg/dL (ref 8–27)
CO2: 24 mmol/L (ref 20–29)
Calcium: 9.3 mg/dL (ref 8.7–10.3)
Chloride: 100 mmol/L (ref 96–106)
Creatinine, Ser: 1.35 mg/dL — ABNORMAL HIGH (ref 0.57–1.00)
Glucose: 90 mg/dL (ref 70–99)
Potassium: 4.5 mmol/L (ref 3.5–5.2)
Sodium: 137 mmol/L (ref 134–144)
eGFR: 42 mL/min/{1.73_m2} — ABNORMAL LOW (ref >59)

## 2023-10-03 ENCOUNTER — Ambulatory Visit (HOSPITAL_COMMUNITY)
Admission: RE | Admit: 2023-10-03 | Discharge: 2023-10-03 | Disposition: A | Discharge status: Home / Self Care | Source: Ambulatory Visit | Attending: Internal Medicine | Admitting: Internal Medicine

## 2023-10-03 ENCOUNTER — Other Ambulatory Visit: Payer: Self-pay | Admitting: Internal Medicine

## 2023-10-03 DIAGNOSIS — J439 Emphysema, unspecified: Secondary | ICD-10-CM | POA: Diagnosis not present

## 2023-10-03 DIAGNOSIS — C3490 Malignant neoplasm of unspecified part of unspecified bronchus or lung: Secondary | ICD-10-CM | POA: Diagnosis not present

## 2023-10-03 DIAGNOSIS — I700 Atherosclerosis of aorta: Secondary | ICD-10-CM | POA: Diagnosis not present

## 2023-10-03 MED ORDER — IOHEXOL 300 MG/ML  SOLN
100.0000 mL | Freq: Once | INTRAMUSCULAR | Status: AC | PRN
  Administered 2023-10-03: 100 mL via INTRAVENOUS

## 2023-10-09 NOTE — Progress Notes (Signed)
 [facility] OFFICE PROGRESS NOTE  Calton Catholic, [name] [address] [place] [address]  DIAGNOSIS:  Stage IV (T1c, N0, M1b) Non-Small Cell Lung Cancer, adenocarcinoma. She presented with a spiculated left upper lobe lung nodule and large right adrenal gland mass. She was diagnosed in July 2024. She had molecular studies that showed she has KRASG12C which can be used in the second line setting.    PDL1: 1%   Molecular Studies: STK11 but likely not candidate for 4 drug combination and KRASG12C  PRIOR THERAPY: SBRT to the left upper lobe and right adrenal targets under the care of Dr. Jeryl Moris. Last dose of treatment expected on 02/20/2023   CURRENT THERAPY:  Systemic chemotherapy with carboplatin  for AUC of 5, Alimta 500 Mg/M2 and Keytruda  200 Mg IV every 3 weeks.  First dose April 05, 2023. Status post 9 cycles. Starting from cycle #5, she started maintenance Alimta and Keytruda .  2) IV iron  with Venofer  300 mg weekly PRN. Last dose on 03/21/23.  INTERVAL HISTORY: Erin Pacheco 73 y.o. female returns to the clinic today for a follow up visit accompanied by her daughter.  The patient is currently undergoing maintenance chemotherapy and immunotherapy.  He is tolerating her treatment well.  At her last appointment with Dr. Marguerita Shih she was endorsing swelling. She has been elevating her legs at night. She wears clogs as they are the only footwear that fits due to the swelling. She has not been using compression stockings regularly but has them available.   She experiences hot flashes and occasional nasal congestion, which she attributes to pollen. She manages nasal sores with Vaseline and takes Claritin and another allergy medication for relief.  Her breathing has been stable with no significant shortness of breath or unusual cough. No chest discomfort, hemoptysis, nausea, or vomiting.   She continues to take Miralax  for constipation and reports no changes in her  bowel habits.  Her skin has darkened and is peeling, particularly on her feet, which she attributes to chemotherapy. She applies Vaseline to manage this.  She confirms that she is still taking her iron  supplement and blood thinner as prescribed. The patient has a history of iron  deficiency anemia secondary to GI bleeding. She has history of PE as well.    She recently had a restaging CT scan performed. She is here today for evaluation repeat blood work before undergoing cycle #10.    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  MEDICATIONS:  Current Outpatient Medications  Medication Sig Dispense Refill   acetaminophen  (TYLENOL ) 325 MG tablet Take 2 tablets (650 mg total) by mouth every 6 (six) hours as needed for mild pain (or Fever >/= 101).     apixaban  (ELIQUIS ) 5 MG TABS tablet Take 1 tablet (5 mg total) by mouth 2 (two) times daily. 60 tablet 3   apixaban  (ELIQUIS ) 5 MG TABS tablet Take 1 tablet (5 mg total) by mouth 2 (two) times daily. 42 tablet 0   Fluticasone -Umeclidin-Vilant (TRELEGY ELLIPTA ) 100-62.5-25 MCG/ACT AEPB Inhale 1 each into the lungs daily.     folic acid  (FOLVITE ) 1 MG tablet Take 1 tablet (1 mg total)  by mouth daily. Start 7 days before pemetrexed  chemotherapy. Continue until 21 days after pemetrexed  completed. 100 tablet 3   lidocaine -prilocaine  (EMLA ) cream APPLY TO THE PORT-A-CATH SITE 30 MINUTES BEFORE TREATMENT. 30 g 0   metoprolol  succinate (TOPROL -XL) 50 MG 24 hr tablet TAKE 1 TABLET BY MOUTH EVERY DAY WITH OR IMMEDIATELY FOLLOWING A MEAL 90 tablet 3   Multiple Vitamin (MULTIVITAMIN) tablet Take 1 tablet by mouth daily.       ondansetron  (ZOFRAN ) 8 MG tablet Take 1 tablet (8 mg  total) by mouth every 8 (eight) hours as needed for nausea or vomiting. 30 tablet 0   oxyCODONE  (OXY IR/ROXICODONE ) 5 MG immediate release tablet Take 1 tablet (5 mg total) by mouth every 6 (six) hours as needed for moderate pain. 60 tablet 0   pantoprazole  (PROTONIX ) 40 MG tablet Take 1 tablet (40 mg total) by mouth daily before breakfast. 30 tablet 1   Polyethyl Glycol-Propyl Glycol (SYSTANE OP) Place 1 drop into both eyes daily as needed (dry eye).     prochlorperazine  (COMPAZINE ) 10 MG tablet Take 1 tablet (10 mg total) by mouth every 6 (six) hours as needed for nausea or vomiting. 30 tablet 1   sacubitril-valsartan (ENTRESTO ) 49-51 MG TAKE 1 TABLET BY MOUTH TWICE A DAY 60 tablet 11   spironolactone  (ALDACTONE ) 25 MG tablet Take 0.5 tablets (12.5 mg total) by mouth daily. Take half a tablet once daily. 45 tablet 3   No current facility-administered medications for this visit.   Facility-Administered Medications Ordered in Other Visits  Medication Dose Route Frequency Provider Last Rate Last Admin   heparin  lock flush 100 unit/mL  500 Units Intracatheter Once Marlene Simas, MD       sodium chloride  flush (NS) 0.9 % injection 10 mL  10 mL Intracatheter Once Marlene Simas, MD        SURGICAL HISTORY:  Past Surgical History:  Procedure Laterality Date   ABDOMINAL HYSTERECTOMY     BILATERAL OOPHORECTOMY     BIOPSY  12/09/2022   Procedure: BIOPSY;  Surgeon: Daina Drum, MD;  Location: [facility] ENDOSCOPY;  Service: Gastroenterology;;   BRONCHIAL BIOPSY  01/09/2023   Procedure: BRONCHIAL BIOPSIES;  Surgeon: Prudy Brownie, DO;  Location: [facility] ENDOSCOPY;  Service: Pulmonary;;   BRONCHIAL NEEDLE ASPIRATION BIOPSY  01/09/2023   Procedure: BRONCHIAL NEEDLE ASPIRATION BIOPSIES;  Surgeon: Prudy Brownie, DO;  Location: [facility] ENDOSCOPY;  Service: Pulmonary;;   CATARACT EXTRACTION W/PHACO  12/27/2010   Procedure: CATARACT EXTRACTION PHACO AND INTRAOCULAR LENS PLACEMENT (IOC);  Surgeon: Anner Kill;   Location: [facility] ORS;  Service: Ophthalmology;  Laterality: Right;   CATARACT EXTRACTION W/PHACO  03/28/2011   Procedure: CATARACT EXTRACTION PHACO AND INTRAOCULAR LENS PLACEMENT (IOC);  Surgeon: Anner Kill;  Location: [facility] ORS;  Service: Ophthalmology;  Laterality: Left;  CDE 7.27   ENTEROSCOPY N/A 02/28/2023   Procedure: ENTEROSCOPY;  Surgeon: Kenney Peacemaker, MD;  Location: [facility] ENDOSCOPY;  Service: Gastroenterology;  Laterality: N/A;   ESOPHAGOGASTRODUODENOSCOPY (EGD) WITH PROPOFOL  N/A 12/09/2022   Procedure: ESOPHAGOGASTRODUODENOSCOPY (EGD) WITH PROPOFOL ;  Surgeon: Daina Drum, MD;  Location: [facility] ENDOSCOPY;  Service: Gastroenterology;  Laterality: N/A;   HOT HEMOSTASIS N/A 02/28/2023   Procedure: HOT HEMOSTASIS (ARGON PLASMA COAGULATION/BICAP);  Surgeon: Kenney Peacemaker, MD;  Location: [facility];  Service: Gastroenterology;  Laterality: N/A;   IR IMAGING GUIDED PORT INSERTION  03/23/2023   SUBMUCOSAL TATTOO INJECTION  02/28/2023   Procedure: SUBMUCOSAL TATTOO INJECTION;  Surgeon: Kenney Peacemaker, MD;  Location:  [facility] ENDOSCOPY;  Service: Gastroenterology;;    REVIEW OF SYSTEMS:   Constitutional: Negative for appetite change, chills, fatigue, fever and unexpected weight change.  HENT:  Positive for intermittent nasal congestion due to allergies. Negative for mouth sores, nosebleeds, sore throat and trouble swallowing.   Eyes: Negative for eye problems and icterus.  Respiratory: Negative for significant cough, hemoptysis, shortness of breath and wheezing.   Cardiovascular: Negative for chest pain. Mild ankle swelling bilaterally.  Gastrointestinal: Positive for mild occasional constipation. Negative for abdominal pain, diarrhea, nausea and vomiting.  Genitourinary: Negative for bladder incontinence, difficulty urinating, dysuria, frequency and hematuria.   Musculoskeletal: Negative for back pain, gait problem, neck pain and neck stiffness.  Skin: Negative for itching and rash.  Neurological: Negative  for dizziness, extremity weakness, gait problem, headaches, light-headedness and seizures.  Hematological: Negative for adenopathy. Does not bruise/bleed easily.  Psychiatric/Behavioral: Negative for confusion, depression and sleep disturbance. The patient is not nervous/anxious.       PHYSICAL EXAMINATION:  There were no vitals taken for this visit.  ECOG PERFORMANCE STATUS: 1  Physical Exam  Constitutional: Oriented to person, place, and time and thin appearing female and, and in no distress. HENT:  Head: Normocephalic and atraumatic.  Mouth/Throat: Oropharynx is clear and moist. No oropharyngeal exudate.  Eyes: Conjunctivae are normal. Right eye exhibits no discharge. Left eye exhibits no discharge. No scleral icterus.  Neck: Normal range of motion. Neck supple.  Cardiovascular: Normal rate, regular rhythm, normal heart sounds and intact distal pulses.   Pulmonary/Chest: Effort normal and breath sounds normal. No respiratory distress. No wheezes. No rales.  Abdominal: Soft. Bowel sounds are normal. Exhibits no distension and no mass. There is no tenderness.  Musculoskeletal: Normal range of motion. Exhibits no edema.  Lymphadenopathy:    No cervical adenopathy.  Neurological: Alert and oriented to person, place, and time. Exhibits muscle wasting. Skin: Skin is warm and dry. No rash noted. Not diaphoretic. No erythema. No pallor.  Psychiatric: Mood, memory and judgment normal.  Vitals reviewed.    LABORATORY DATA: Lab Results  Component Value Date   WBC 5.1 09/21/2023   HGB 10.3 (L) 09/21/2023   HCT 30.8 (L) 09/21/2023   MCV 99.7 09/21/2023   PLT 321 09/21/2023      Chemistry      Component Value Date/Time   NA 137 09/27/2023 1224   K 4.5 09/27/2023 1224   CL 100 09/27/2023 1224   CO2 24 09/27/2023 1224   BUN 26 09/27/2023 1224   CREATININE 1.35 (H) 09/27/2023 1224   CREATININE 1.26 (H) 09/21/2023 1308      Component Value Date/Time   CALCIUM 9.3 09/27/2023 1224    ALKPHOS 138 (H) 09/21/2023 1308   AST 36 09/21/2023 1308   ALT 24 09/21/2023 1308   BILITOT 0.3 09/21/2023 1308       RADIOGRAPHIC STUDIES:  No results found.   ASSESSMENT/PLAN:  This is a very pleasant 73 year old female with stage IV (T1c, N0, M1) Non-Small Cell Lung Cancer, adenocarcinoma. She presented with a spiculated left upper lobe lung nodule and large right adrenal gland mass. She was diagnosed in July 2024. She had molecular studies that showed she has KRASG12C which can be used in the second line setting.  PDL1 1%, she has STK 11.  Dr. Marguerita Shih does not feel that the patient is not a good candidate for the 4 drug combination and her performance status.   She underwent radiation to the adrenal lesion and  the lung under the care of Dr. Jeryl Moris. The last dose was on 02/20/23   She is currently on palliative chemotherapy with carboplatin  for an AUC of 5, Alimta 500 mg/m2 and immunotherapy with Keytruda  200 mg IV every 3 weeks. First dose on 04/05/23 which she tolerated well except for neutropenia for which she receives zarxio  as needed. She is status post 9 cycles. Starting from cycle #5, she started maintenance Alimta and Keytruda .   The patient was seen with Dr. Marguerita Shih today.  Dr. Marguerita Shih personally and independently reviewed the scan and discussed results with the patient today.  The scan showed no evidence of disease progression. There is a nonspecific lingular nodule and 6 mm left lower lobe ground glass nodule that may be infectious or inflammatory that we will monitor.  Dr. Marguerita Shih recommends she continue on the same treatment at the same dose    Labs were reviewed. Recommend that she proceed with cycle #10 today as scheduled.    She will continue her iron  supplement and blood thinner.    Of note, she is allergic to chloraprep.    Will see her back for labs and follow-up visit in 3 weeks for evaluation repeat blood work before undergoing cycle #11  Allergic rhinitis Nasal  sores likely due to pollen exposure. Symptoms well-controlled with current management. - Continue Vaseline application and alternating use of Claritin and other allergy medication.  Constipation Managed with Miralax  as needed. No changes in bowel habits. - Continue Miralax  as needed.  Peripheral edema Managed with leg elevation. No significant swelling observed. Compression stockings recommended. - Recommend wearing compression stockings during the daytime. - Continue leg elevation at night.  Skin changes due to chemotherapy Skin darkening likely due to alima   The patient was advised to call immediately if she has any concerning symptoms in the interval. The patient voices understanding of current disease status and treatment options and is in agreement with the current care plan. All questions were answered. The patient knows to call the clinic with any problems, questions or concerns. We can certainly see the patient much sooner if necessary    No orders of the defined types were placed in this encounter.     Neaveh Belanger L Josias Tomerlin, [name] 10/09/23   ADDENDUM: Hematology/Oncology Attending:  I had a face-to-face encounter with the patient today.  I reviewed her records, lab, scan and recommended her care plan.  This is a very pleasant 73 years old African-American female with a stage IV non-small cell lung cancer, adenocarcinoma diagnosed in July 2024 with positive KRAS G12C mutation and PD-L1 expression of 1%. The patient started palliative systemic chemotherapy with carboplatin , Alimta and Keytruda  for 4 cycles and starting from cycle #5 she has been on maintenance treatment with Alimta and Keytruda  every 3 weeks status post a total of 9 cycles.  The patient has been tolerating her maintenance treatment fairly well. She had repeat CT scan of the chest, abdomen and pelvis performed recently.  I personally and independently reviewed the scan and discussed the result with the  patient and her daughter. Her scan showed no concerning findings for disease progression. I recommended for her to continue her current maintenance treatment with Alimta and Keytruda  every 3 weeks and she will proceed with cycle #10 today. The patient will come back for follow-up visit in 3 weeks for evaluation before the next cycle of her treatment. She was advised to call immediately if she has any other concerning symptoms in the interval. The  total time spent in the appointment was 30 minutes including review of chart and various tests results, discussions about plan of care and coordination of care plan . Discussed with the patient and all questioned fully answered. She will call me if any problems arise. Aurelio Blower, MD

## 2023-10-12 ENCOUNTER — Inpatient Hospital Stay: Attending: Physician Assistant

## 2023-10-12 ENCOUNTER — Inpatient Hospital Stay (HOSPITAL_BASED_OUTPATIENT_CLINIC_OR_DEPARTMENT_OTHER): Admitting: Physician Assistant

## 2023-10-12 ENCOUNTER — Inpatient Hospital Stay

## 2023-10-12 VITALS — BP 120/71 | HR 65 | Temp 98.1°F | Resp 17 | Ht 63.0 in | Wt 139.7 lb

## 2023-10-12 DIAGNOSIS — Z95828 Presence of other vascular implants and grafts: Secondary | ICD-10-CM

## 2023-10-12 DIAGNOSIS — C7971 Secondary malignant neoplasm of right adrenal gland: Secondary | ICD-10-CM | POA: Insufficient documentation

## 2023-10-12 DIAGNOSIS — Z79631 Long term (current) use of antimetabolite agent: Secondary | ICD-10-CM | POA: Diagnosis not present

## 2023-10-12 DIAGNOSIS — Z720 Tobacco use: Secondary | ICD-10-CM

## 2023-10-12 DIAGNOSIS — Z5111 Encounter for antineoplastic chemotherapy: Secondary | ICD-10-CM | POA: Insufficient documentation

## 2023-10-12 DIAGNOSIS — C3412 Malignant neoplasm of upper lobe, left bronchus or lung: Secondary | ICD-10-CM

## 2023-10-12 DIAGNOSIS — Z5112 Encounter for antineoplastic immunotherapy: Secondary | ICD-10-CM

## 2023-10-12 DIAGNOSIS — Z79620 Long term (current) use of immunosuppressive biologic: Secondary | ICD-10-CM | POA: Diagnosis not present

## 2023-10-12 DIAGNOSIS — T451X5A Adverse effect of antineoplastic and immunosuppressive drugs, initial encounter: Secondary | ICD-10-CM

## 2023-10-12 DIAGNOSIS — C3492 Malignant neoplasm of unspecified part of left bronchus or lung: Secondary | ICD-10-CM

## 2023-10-12 LAB — CMP (CANCER CENTER ONLY)
ALT: 21 U/L (ref 0–44)
AST: 32 U/L (ref 15–41)
Albumin: 3.1 g/dL — ABNORMAL LOW (ref 3.5–5.0)
Alkaline Phosphatase: 123 U/L (ref 38–126)
Anion gap: 3 — ABNORMAL LOW (ref 5–15)
BUN: 18 mg/dL (ref 8–23)
CO2: 29 mmol/L (ref 22–32)
Calcium: 8.6 mg/dL — ABNORMAL LOW (ref 8.9–10.3)
Chloride: 107 mmol/L (ref 98–111)
Creatinine: 1.22 mg/dL — ABNORMAL HIGH (ref 0.44–1.00)
GFR, Estimated: 47 mL/min — ABNORMAL LOW (ref >60)
Glucose, Bld: 96 mg/dL (ref 70–99)
Potassium: 3.9 mmol/L (ref 3.5–5.1)
Sodium: 139 mmol/L (ref 135–145)
Total Bilirubin: 0.4 mg/dL (ref 0.0–1.2)
Total Protein: 7.5 g/dL (ref 6.5–8.1)

## 2023-10-12 LAB — CBC WITH DIFFERENTIAL (CANCER CENTER ONLY)
Abs Immature Granulocytes: 0.02 10*3/uL (ref 0.00–0.07)
Basophils Absolute: 0 10*3/uL (ref 0.0–0.1)
Basophils Relative: 1 %
Eosinophils Absolute: 0.1 10*3/uL (ref 0.0–0.5)
Eosinophils Relative: 2 %
HCT: 28.8 % — ABNORMAL LOW (ref 36.0–46.0)
Hemoglobin: 9.8 g/dL — ABNORMAL LOW (ref 12.0–15.0)
Immature Granulocytes: 1 %
Lymphocytes Relative: 28 %
Lymphs Abs: 1.2 10*3/uL (ref 0.7–4.0)
MCH: 33.3 pg (ref 26.0–34.0)
MCHC: 34 g/dL (ref 30.0–36.0)
MCV: 98 fL (ref 80.0–100.0)
Monocytes Absolute: 0.9 10*3/uL (ref 0.1–1.0)
Monocytes Relative: 22 %
Neutro Abs: 2 10*3/uL (ref 1.7–7.7)
Neutrophils Relative %: 46 %
Platelet Count: 298 10*3/uL (ref 150–400)
RBC: 2.94 MIL/uL — ABNORMAL LOW (ref 3.87–5.11)
RDW: 15.9 % — ABNORMAL HIGH (ref 11.5–15.5)
WBC Count: 4.1 10*3/uL (ref 4.0–10.5)
nRBC: 0 % (ref 0.0–0.2)

## 2023-10-12 MED ORDER — HEPARIN SOD (PORK) LOCK FLUSH 100 UNIT/ML IV SOLN
500.0000 [IU] | Freq: Once | INTRAVENOUS | Status: AC | PRN
Start: 2023-10-12 — End: 2023-10-12
  Administered 2023-10-12: 500 [IU]

## 2023-10-12 MED ORDER — SODIUM CHLORIDE 0.9% FLUSH
10.0000 mL | INTRAVENOUS | Status: DC | PRN
  Administered 2023-10-12: 10 mL

## 2023-10-12 MED ORDER — SODIUM CHLORIDE 0.9 % IV SOLN: Freq: Once | INTRAVENOUS | Status: AC

## 2023-10-12 MED ORDER — PROCHLORPERAZINE MALEATE 10 MG PO TABS
10.0000 mg | ORAL_TABLET | Freq: Once | ORAL | Status: AC
Start: 2023-10-12 — End: 2023-10-12
  Administered 2023-10-12: 10 mg via ORAL
  Filled 2023-10-12: qty 1

## 2023-10-12 MED ORDER — SODIUM CHLORIDE 0.9 % IV SOLN
200.0000 mg | Freq: Once | INTRAVENOUS | Status: AC
  Administered 2023-10-12: 200 mg via INTRAVENOUS
  Filled 2023-10-12: qty 200

## 2023-10-12 MED ORDER — SODIUM CHLORIDE 0.9 % IV SOLN
500.0000 mg/m2 | Freq: Once | INTRAVENOUS | Status: AC
  Administered 2023-10-12: 800 mg via INTRAVENOUS
  Filled 2023-10-12: qty 20

## 2023-10-12 MED ORDER — SODIUM CHLORIDE 0.9% FLUSH
10.0000 mL | Freq: Once | INTRAVENOUS | Status: AC
  Administered 2023-10-12: 10 mL

## 2023-10-12 NOTE — Patient Instructions (Signed)
 [facility] A DE[facility]  Discharge Instructions: Thank you for choosing [facility] to provide your oncology and hematology care.   If you have a lab appointment with the Cancer Center, please go directly to the Cancer Center and check in at the registration area.   Wear comfortable clothing and clothing appropriate for easy access to any Portacath or PICC line.   We strive to give you quality time with your provider. You may need to reschedule your appointment if you arrive late (15 or more minutes).  Arriving late affects you and other patients whose appointments are after yours.  Also, if you miss three or more appointments without notifying the office, you may be dismissed from the clinic at the provider's discretion.      For prescription refill requests, have your pharmacy contact our office and allow 72 hours for refills to be completed.    Today you received the following chemotherapy and/or immunotherapy agents: Pembrolizumab (Keytruda) and Pemetrexed (Alimta)      To help prevent nausea and vomiting after your treatment, we encourage you to take your nausea medication as directed.  BELOW ARE SYMPTOMS THAT SHOULD BE REPORTED IMMEDIATELY: *FEVER GREATER THAN 100.4 F (38 C) OR HIGHER *CHILLS OR SWEATING *NAUSEA AND VOMITING THAT IS NOT CONTROLLED WITH YOUR NAUSEA MEDICATION *UNUSUAL SHORTNESS OF BREATH *UNUSUAL BRUISING OR BLEEDING *URINARY PROBLEMS (pain or burning when urinating, or frequent urination) *BOWEL PROBLEMS (unusual diarrhea, constipation, pain near the anus) TENDERNESS IN MOUTH AND THROAT WITH OR WITHOUT PRESENCE OF ULCERS (sore throat, sores in mouth, or a toothache) UNUSUAL RASH, SWELLING OR PAIN  UNUSUAL VAGINAL DISCHARGE OR ITCHING   Items with * indicate a potential emergency and should be followed up as soon as possible or go to the Emergency Department if any problems should occur.  Please show the  CHEMOTHERAPY ALERT CARD or IMMUNOTHERAPY ALERT CARD at check-in to the Emergency Department and triage nurse.  Should you have questions after your visit or need to cancel or reschedule your appointment, please contact [facility]  Dept: [phone]  and follow the prompts.  Office hours are 8:00 a.m. to 4:30 p.m. Monday - Friday. Please note that voicemails left after 4:00 p.m. may not be returned until the following business day.  We are closed weekends and major holidays. You have access to a nurse at all times for urgent questions. Please call the main number to the clinic Dept: [phone] and follow the prompts.   For any non-urgent questions, you may also contact your provider using MyChart. We now offer e-Visits for anyone 12 and older to request care online for non-urgent symptoms. For details visit [email or web address].   Also download the MyChart app! Go to the app store, search "MyChart", open the app, select [facility], and log in with your MyChart username and password.

## 2023-10-13 ENCOUNTER — Encounter: Payer: Self-pay | Admitting: Physician Assistant

## 2023-10-13 ENCOUNTER — Encounter: Payer: Self-pay | Admitting: Internal Medicine

## 2023-10-13 ENCOUNTER — Other Ambulatory Visit: Payer: Self-pay

## 2023-10-15 ENCOUNTER — Other Ambulatory Visit: Payer: Self-pay

## 2023-10-24 ENCOUNTER — Other Ambulatory Visit: Payer: Self-pay

## 2023-10-24 ENCOUNTER — Emergency Department (HOSPITAL_COMMUNITY)

## 2023-10-24 ENCOUNTER — Emergency Department (HOSPITAL_COMMUNITY): Admission: EM | Admit: 2023-10-24 | Discharge: 2023-10-24 | Disposition: A | Discharge status: Home / Self Care

## 2023-10-24 ENCOUNTER — Telehealth: Payer: Self-pay

## 2023-10-24 DIAGNOSIS — R0981 Nasal congestion: Secondary | ICD-10-CM | POA: Diagnosis not present

## 2023-10-24 DIAGNOSIS — Z85118 Personal history of other malignant neoplasm of bronchus and lung: Secondary | ICD-10-CM | POA: Insufficient documentation

## 2023-10-24 DIAGNOSIS — Z7901 Long term (current) use of anticoagulants: Secondary | ICD-10-CM | POA: Diagnosis not present

## 2023-10-24 DIAGNOSIS — I509 Heart failure, unspecified: Secondary | ICD-10-CM | POA: Diagnosis not present

## 2023-10-24 DIAGNOSIS — J449 Chronic obstructive pulmonary disease, unspecified: Secondary | ICD-10-CM | POA: Insufficient documentation

## 2023-10-24 DIAGNOSIS — R55 Syncope and collapse: Secondary | ICD-10-CM | POA: Diagnosis not present

## 2023-10-24 DIAGNOSIS — I110 Hypertensive heart disease with heart failure: Secondary | ICD-10-CM | POA: Diagnosis not present

## 2023-10-24 DIAGNOSIS — R42 Dizziness and giddiness: Secondary | ICD-10-CM | POA: Diagnosis not present

## 2023-10-24 LAB — COMPREHENSIVE METABOLIC PANEL WITH GFR
ALT: 24 U/L (ref 0–44)
AST: 32 U/L (ref 15–41)
Albumin: 2.8 g/dL — ABNORMAL LOW (ref 3.5–5.0)
Alkaline Phosphatase: 133 U/L — ABNORMAL HIGH (ref 38–126)
Anion gap: 7 (ref 5–15)
BUN: 19 mg/dL (ref 8–23)
CO2: 28 mmol/L (ref 22–32)
Calcium: 8.8 mg/dL — ABNORMAL LOW (ref 8.9–10.3)
Chloride: 103 mmol/L (ref 98–111)
Creatinine, Ser: 1.27 mg/dL — ABNORMAL HIGH (ref 0.44–1.00)
GFR, Estimated: 45 mL/min — ABNORMAL LOW (ref >60)
Glucose, Bld: 130 mg/dL — ABNORMAL HIGH (ref 70–99)
Potassium: 3.7 mmol/L (ref 3.5–5.1)
Sodium: 138 mmol/L (ref 135–145)
Total Bilirubin: 0.5 mg/dL (ref 0.0–1.2)
Total Protein: 7.8 g/dL (ref 6.5–8.1)

## 2023-10-24 LAB — TROPONIN I (HIGH SENSITIVITY)
Troponin I (High Sensitivity): 11 ng/L (ref <18)
Troponin I (High Sensitivity): 10 ng/L (ref <18)

## 2023-10-24 LAB — URINALYSIS, ROUTINE W REFLEX MICROSCOPIC
Bilirubin Urine: NEGATIVE
Glucose, UA: NEGATIVE mg/dL
Hgb urine dipstick: NEGATIVE
Ketones, ur: NEGATIVE mg/dL
Nitrite: NEGATIVE
Protein, ur: NEGATIVE mg/dL
Specific Gravity, Urine: 1.017 (ref 1.005–1.030)
pH: 6 (ref 5.0–8.0)

## 2023-10-24 LAB — CBC
HCT: 28.7 % — ABNORMAL LOW (ref 36.0–46.0)
Hemoglobin: 9.3 g/dL — ABNORMAL LOW (ref 12.0–15.0)
MCH: 33.2 pg (ref 26.0–34.0)
MCHC: 32.4 g/dL (ref 30.0–36.0)
MCV: 102.5 fL — ABNORMAL HIGH (ref 80.0–100.0)
Platelets: 140 10*3/uL — ABNORMAL LOW (ref 150–400)
RBC: 2.8 MIL/uL — ABNORMAL LOW (ref 3.87–5.11)
RDW: 15.5 % (ref 11.5–15.5)
WBC: 3.2 10*3/uL — ABNORMAL LOW (ref 4.0–10.5)
nRBC: 0.9 % — ABNORMAL HIGH (ref 0.0–0.2)

## 2023-10-24 LAB — CBG MONITORING, ED: Glucose-Capillary: 83 mg/dL (ref 70–99)

## 2023-10-24 LAB — BRAIN NATRIURETIC PEPTIDE: B Natriuretic Peptide: 74.5 pg/mL (ref 0.0–100.0)

## 2023-10-24 MED ORDER — MECLIZINE HCL 25 MG PO TABS
25.0000 mg | ORAL_TABLET | Freq: Once | ORAL | Status: AC
  Administered 2023-10-24: 25 mg via ORAL
  Filled 2023-10-24: qty 1

## 2023-10-24 MED ORDER — MECLIZINE HCL 25 MG PO TABS: 25.0000 mg | ORAL_TABLET | Freq: Three times a day (TID) | ORAL | 0 refills | Status: AC | PRN

## 2023-10-24 NOTE — Discharge Instructions (Addendum)
 Please follow-up with your primary doctor.  Return if you develop sudden onset headache, vision changes, facial droop, unilateral weakness, or any new or worsening symptoms that are concerning to you.

## 2023-10-24 NOTE — Telephone Encounter (Signed)
 Spoke with patient's daughter, Erin Pacheco, regarding the patient's complaints of cough, dizzy spells, and feeling like she is going to pass out.  Daughter reports that the patient's cough has been ongoing for the past week or two, with clear, thick mucus being coughed up. Additionally, the patient has been experiencing dizzy spells and a sensation of near fainting for the last few days.  Spoke with Dr. Marguerita Shih, who recommends that the patient take over-the-counter cough medicine as needed. He also advised the patient to stay well-hydrated and monitor her blood pressure at home. Dr. Marguerita Shih instructed that if symptoms do not improve in a few days, the patient should call our office for further evaluation and possible follow-up care.  Daughter reports that she is taking the patient to [facility] hospital for further evaluation.

## 2023-10-24 NOTE — ED Notes (Signed)
 Pt walked around the department with Clinical research associate. Pt had no complaints. MD notified.

## 2023-10-24 NOTE — ED Provider Notes (Signed)
 [place] EMERGENCY DEPARTMENT AT [facility] Provider Note   CSN: [id number] Arrival date & time: 10/24/23  1650     History  Chief Complaint  Patient presents with   Dizziness    Erin Pacheco is a 73 y.o. female.  This is a 73 year old female presenting emergency department for dizziness for the past 2 days.  Intermittent.  Last for several seconds at night when she lies down in bed.  Resolved spontaneously.  No other symptoms.  Today was having work done in her home and was looking up and around at the ceiling with them.  She reports she then became suddenly dizzy but the room is spinning lasted for less than a minute and resolved.  No nausea vomiting.  No chest pain or shortness of breath.  She did note that she felt like she was going to pass out during this episode.  She states she feels her normal self currently has no symptoms.  No headache, no vision changes, no facial droop, no unilateral weakness.  Does note some congestion, but no other infectious symptoms.  No abdominal pain no nausea vomiting.   Dizziness      Home Medications Prior to Admission medications   Medication Sig Start Date End Date Taking? Authorizing Provider  cephALEXin (KEFLEX) 500 MG capsule Take 1 capsule (500 mg total) by mouth 4 (four) times daily. 10/25/23  Yes Rolinda Climes, DO  meclizine (ANTIVERT) 25 MG tablet Take 1 tablet (25 mg total) by mouth 3 (three) times daily as needed for dizziness. 10/24/23  Yes Rolinda Climes, DO  acetaminophen  (TYLENOL ) 325 MG tablet Take 2 tablets (650 mg total) by mouth every 6 (six) hours as needed for mild pain (or Fever >/= 101). 11/26/22   Ozell Blunt, MD  apixaban  (ELIQUIS ) 5 MG TABS tablet Take 1 tablet (5 mg total) by mouth 2 (two) times daily. 07/28/23   Eilleen Grates, MD  apixaban  (ELIQUIS ) 5 MG TABS tablet Take 1 tablet (5 mg total) by mouth 2 (two) times daily. 06/22/23   Eilleen Grates, MD  Fluticasone -Umeclidin-Vilant (TRELEGY  ELLIPTA) 100-62.5-25 MCG/ACT AEPB Inhale 1 each into the lungs daily. 01/17/23   Raejean Bullock, NP  folic acid  (FOLVITE ) 1 MG tablet Take 1 tablet (1 mg total) by mouth daily. Start 7 days before pemetrexed  chemotherapy. Continue until 21 days after pemetrexed  completed. 03/13/23   Marlene Simas, MD  lidocaine -prilocaine  (EMLA ) cream APPLY TO THE PORT-A-CATH SITE 30 MINUTES BEFORE TREATMENT. 07/03/23   Marlene Simas, MD  metoprolol  succinate (TOPROL -XL) 50 MG 24 hr tablet TAKE 1 TABLET BY MOUTH EVERY DAY WITH OR IMMEDIATELY FOLLOWING A MEAL 09/11/23   Meng, Hao, PA  Multiple Vitamin (MULTIVITAMIN) tablet Take 1 tablet by mouth daily.      [provider]  ondansetron  (ZOFRAN ) 8 MG tablet Take 1 tablet (8 mg total) by mouth every 8 (eight) hours as needed for nausea or vomiting. 02/09/23   Bettejane Brownie, [name]  oxyCODONE  (OXY IR/ROXICODONE ) 5 MG immediate release tablet Take 1 tablet (5 mg total) by mouth every 6 (six) hours as needed for moderate pain. 12/22/22   Ander Bame, MD  pantoprazole  (PROTONIX ) 40 MG tablet Take 1 tablet (40 mg total) by mouth daily before breakfast. 06/20/23   Marlene Simas, MD  Polyethyl Glycol-Propyl Glycol (SYSTANE OP) Place 1 drop into both eyes daily as needed (dry eye).    [provider]  prochlorperazine  (COMPAZINE ) 10 MG tablet Take  1 tablet (10 mg total) by mouth every 6 (six) hours as needed for nausea or vomiting. 03/13/23   Marlene Simas, MD  sacubitril-valsartan (ENTRESTO ) 49-51 MG TAKE 1 TABLET BY MOUTH TWICE A DAY 08/30/23   Meng, Hao, PA  spironolactone  (ALDACTONE ) 25 MG tablet Take 0.5 tablets (12.5 mg total) by mouth daily. Take half a tablet once daily. 09/12/23 12/11/23  Meng, Hao, PA      Allergies    Chlorhexidine     Review of Systems   Review of Systems  Neurological:  Positive for dizziness.    Physical Exam Updated Vital Signs BP (!) 161/72 (BP Location: Left Arm)   Pulse 74   Temp 98.4 F (36.9 C) (Oral)    Resp 19   Ht 5\' 3"  (1.6 m)   Wt 67.5 kg   SpO2 99%   BMI 26.36 kg/m  Physical Exam Vitals and nursing note reviewed.  Constitutional:      General: She is not in acute distress.    Appearance: She is not toxic-appearing.  HENT:     Head: Normocephalic and atraumatic.     Nose: Congestion present.     Mouth/Throat:     Mouth: Mucous membranes are moist.  Eyes:     Extraocular Movements: Extraocular movements intact.     Conjunctiva/sclera: Conjunctivae normal.     Pupils: Pupils are equal, round, and reactive to light.  Cardiovascular:     Rate and Rhythm: Normal rate and regular rhythm.  Pulmonary:     Effort: Pulmonary effort is normal.     Breath sounds: Normal breath sounds.  Abdominal:     General: Abdomen is flat. There is no distension.     Palpations: Abdomen is soft.     Tenderness: There is no abdominal tenderness. There is no guarding or rebound.  Musculoskeletal:        General: Normal range of motion.     Cervical back: Normal range of motion.  Skin:    General: Skin is warm and dry.     Capillary Refill: Capillary refill takes less than 2 seconds.  Neurological:     General: No focal deficit present.     Mental Status: She is alert and oriented to person, place, and time.     Cranial Nerves: No cranial nerve deficit.     Sensory: No sensory deficit.     Motor: No weakness.     Coordination: Coordination normal.  Psychiatric:        Mood and Affect: Mood normal.        Behavior: Behavior normal.     ED Results / Procedures / Treatments   Labs (all labs ordered are listed, but only abnormal results are displayed) Labs Reviewed  COMPREHENSIVE METABOLIC PANEL WITH GFR - Abnormal; Notable for the following components:      Result Value   Glucose, Bld 130 (*)    Creatinine, Ser 1.27 (*)    Calcium 8.8 (*)    Albumin 2.8 (*)    Alkaline Phosphatase 133 (*)    GFR, Estimated 45 (*)    All other components within normal limits  CBC - Abnormal; Notable  for the following components:   WBC 3.2 (*)    RBC 2.80 (*)    Hemoglobin 9.3 (*)    HCT 28.7 (*)    MCV 102.5 (*)    Platelets 140 (*)    nRBC 0.9 (*)    All other components within normal limits  URINALYSIS,  ROUTINE W REFLEX MICROSCOPIC - Abnormal; Notable for the following components:   Leukocytes,Ua MODERATE (*)    Bacteria, UA RARE (*)    All other components within normal limits  BRAIN NATRIURETIC PEPTIDE  CBG MONITORING, ED  TROPONIN I (HIGH SENSITIVITY)  TROPONIN I (HIGH SENSITIVITY)    EKG None  Radiology CT Head Wo Contrast Result Date: 10/24/2023 CLINICAL DATA:  Dizziness and near syncopal episode EXAM: CT HEAD WITHOUT CONTRAST TECHNIQUE: Contiguous axial images were obtained from the base of the skull through the vertex without intravenous contrast. RADIATION DOSE REDUCTION: This exam was performed according to the departmental dose-optimization program which includes automated exposure control, adjustment of the mA and/or kV according to patient size and/or use of iterative reconstruction technique. COMPARISON:  08/30/2017 FINDINGS: Brain: No evidence of acute infarction, hemorrhage, hydrocephalus, extra-axial collection or mass lesion/mass effect. Vascular: No hyperdense vessel or unexpected calcification. Skull: Normal. Negative for fracture or focal lesion. Sinuses/Orbits: No acute finding. Other: None. IMPRESSION: No acute intracranial abnormality noted. Electronically Signed   By: Violeta Grey M.D.   On: 10/24/2023 21:12   DG Chest Portable 1 View Result Date: 10/24/2023 CLINICAL DATA:  Near syncope. EXAM: PORTABLE CHEST 1 VIEW COMPARISON:  01/17/2023. FINDINGS: Bilateral lung fields are clear. Bilateral costophrenic angles are clear. Normal cardio-mediastinal silhouette. No acute osseous abnormalities. The soft tissues are within normal limits. Right-sided CT Port-A-Cath is seen with its tip overlying the lower portion of superior vena cava. IMPRESSION: *No active  disease. Electronically Signed   By: Beula Brunswick M.D.   On: 10/24/2023 18:56    Procedures Procedures    Medications Ordered in ED Medications  meclizine (ANTIVERT) tablet 25 mg (25 mg Oral Given 10/24/23 1835)    ED Course/ Medical Decision Making/ A&P Clinical Course as of 10/25/23 0007  Tue Oct 24, 2023  1822 CBC(!) Appears to be at patient's baseline per review of all labs with the exception of platelets.  New thrombocytopenia [TY]  1823 Comprehensive metabolic panel(!) No significant metabolic derangements.  Kidney function appears to be at baseline [TY]    Clinical Course User Index [TY] Rolinda Climes, DO                                 Medical Decision Making This is a 73 year old female complex past medical history to include lung cancer, COPD, hypertension, CHF.  She presented emergency department for dizziness.  She is afebrile nontachycardic, maintaining oxygen saturation on room air.  Clinically well-appearing and is asymptomatic currently.  No focal deficits on exam.  History provided by patient suggest a peripheral cause.  Will treat with meclizine.  She primarily describes symptoms as room spinning sensation, but noted this afternoon felt lightheaded like she may pass out as well.  Will get cardiac screening labs, EKG and chest x-ray given her cardiac history.  Family at bedside notes that she is at her baseline they also note that she has lung cancer currently undergoing treatment, with last treatment with approximately 2 weeks ago.  Given patient's cancer history we will get CT head to evaluate for overt metastatic process.    Update.  Labs reassuring.  Appears to have UTI.  CT head negative for acute pathology.  Able to ambulate without symptoms.  Will discharge with antibiotics.  Will also give meclizine for BPPV  Amount and/or Complexity of Data Reviewed Labs: ordered. Decision-making details documented in ED Course. Radiology:  ordered.  Risk Prescription  drug management.         Final Clinical Impression(s) / ED Diagnoses Final diagnoses:  Vertigo    Rx / DC Orders ED Discharge Orders          Ordered    cephALEXin (KEFLEX) 500 MG capsule  4 times daily        10/25/23 0006    meclizine (ANTIVERT) 25 MG tablet  3 times daily PRN        10/24/23 2219              Rolinda Climes, DO 10/25/23 0007

## 2023-10-24 NOTE — ED Triage Notes (Signed)
 Pt came in for dizziness for the past couple days. Pt has also had a cough for over a week. Per pt family, she was close to passing out twice today but never fainted.

## 2023-10-24 NOTE — ED Notes (Signed)
 RN called lab to have Troponin and BNP added

## 2023-10-25 MED ORDER — CEPHALEXIN 500 MG PO CAPS: 500.0000 mg | ORAL_CAPSULE | Freq: Four times a day (QID) | ORAL | 0 refills | Status: DC

## 2023-11-02 ENCOUNTER — Inpatient Hospital Stay

## 2023-11-02 ENCOUNTER — Encounter: Payer: Self-pay | Admitting: Internal Medicine

## 2023-11-02 ENCOUNTER — Inpatient Hospital Stay (HOSPITAL_BASED_OUTPATIENT_CLINIC_OR_DEPARTMENT_OTHER): Admitting: Internal Medicine

## 2023-11-02 VITALS — BP 119/59 | HR 69 | Temp 97.3°F | Resp 16 | Wt 137.7 lb

## 2023-11-02 DIAGNOSIS — C3412 Malignant neoplasm of upper lobe, left bronchus or lung: Secondary | ICD-10-CM

## 2023-11-02 DIAGNOSIS — Z95828 Presence of other vascular implants and grafts: Secondary | ICD-10-CM

## 2023-11-02 DIAGNOSIS — Z5112 Encounter for antineoplastic immunotherapy: Secondary | ICD-10-CM | POA: Diagnosis not present

## 2023-11-02 DIAGNOSIS — C3492 Malignant neoplasm of unspecified part of left bronchus or lung: Secondary | ICD-10-CM

## 2023-11-02 DIAGNOSIS — T451X5A Adverse effect of antineoplastic and immunosuppressive drugs, initial encounter: Secondary | ICD-10-CM

## 2023-11-02 LAB — CBC WITH DIFFERENTIAL (CANCER CENTER ONLY)
Abs Immature Granulocytes: 0.02 10*3/uL (ref 0.00–0.07)
Basophils Absolute: 0 10*3/uL (ref 0.0–0.1)
Basophils Relative: 1 %
Eosinophils Absolute: 0.1 10*3/uL (ref 0.0–0.5)
Eosinophils Relative: 2 %
HCT: 32.4 % — ABNORMAL LOW (ref 36.0–46.0)
Hemoglobin: 10.8 g/dL — ABNORMAL LOW (ref 12.0–15.0)
Immature Granulocytes: 1 %
Lymphocytes Relative: 29 %
Lymphs Abs: 1.3 10*3/uL (ref 0.7–4.0)
MCH: 32.5 pg (ref 26.0–34.0)
MCHC: 33.3 g/dL (ref 30.0–36.0)
MCV: 97.6 fL (ref 80.0–100.0)
Monocytes Absolute: 0.8 10*3/uL (ref 0.1–1.0)
Monocytes Relative: 18 %
Neutro Abs: 2.1 10*3/uL (ref 1.7–7.7)
Neutrophils Relative %: 49 %
Platelet Count: 341 10*3/uL (ref 150–400)
RBC: 3.32 MIL/uL — ABNORMAL LOW (ref 3.87–5.11)
RDW: 15.6 % — ABNORMAL HIGH (ref 11.5–15.5)
WBC Count: 4.3 10*3/uL (ref 4.0–10.5)
nRBC: 0 % (ref 0.0–0.2)

## 2023-11-02 LAB — CMP (CANCER CENTER ONLY)
ALT: 19 U/L (ref 0–44)
AST: 31 U/L (ref 15–41)
Albumin: 3.3 g/dL — ABNORMAL LOW (ref 3.5–5.0)
Alkaline Phosphatase: 136 U/L — ABNORMAL HIGH (ref 38–126)
Anion gap: 3 — ABNORMAL LOW (ref 5–15)
BUN: 21 mg/dL (ref 8–23)
CO2: 28 mmol/L (ref 22–32)
Calcium: 9.4 mg/dL (ref 8.9–10.3)
Chloride: 104 mmol/L (ref 98–111)
Creatinine: 1.34 mg/dL — ABNORMAL HIGH (ref 0.44–1.00)
GFR, Estimated: 42 mL/min — ABNORMAL LOW (ref >60)
Glucose, Bld: 122 mg/dL — ABNORMAL HIGH (ref 70–99)
Potassium: 3.9 mmol/L (ref 3.5–5.1)
Sodium: 135 mmol/L (ref 135–145)
Total Bilirubin: 0.4 mg/dL (ref 0.0–1.2)
Total Protein: 8.5 g/dL — ABNORMAL HIGH (ref 6.5–8.1)

## 2023-11-02 MED ORDER — SODIUM CHLORIDE 0.9 % IV SOLN: Freq: Once | INTRAVENOUS | Status: AC

## 2023-11-02 MED ORDER — SODIUM CHLORIDE 0.9% FLUSH
10.0000 mL | Freq: Once | INTRAVENOUS | Status: AC
  Administered 2023-11-02: 10 mL

## 2023-11-02 MED ORDER — PROCHLORPERAZINE MALEATE 10 MG PO TABS
10.0000 mg | ORAL_TABLET | Freq: Once | ORAL | Status: AC
  Administered 2023-11-02: 10 mg via ORAL
  Filled 2023-11-02: qty 1

## 2023-11-02 MED ORDER — SODIUM CHLORIDE 0.9 % IV SOLN
500.0000 mg/m2 | Freq: Once | INTRAVENOUS | Status: AC
  Administered 2023-11-02: 800 mg via INTRAVENOUS
  Filled 2023-11-02: qty 20

## 2023-11-02 MED ORDER — SODIUM CHLORIDE 0.9 % IV SOLN
200.0000 mg | Freq: Once | INTRAVENOUS | Status: AC
  Administered 2023-11-02: 200 mg via INTRAVENOUS
  Filled 2023-11-02: qty 200

## 2023-11-02 NOTE — Patient Instructions (Signed)
 [facility] A DE[facility]  Discharge Instructions: Thank you for choosing [facility] to provide your oncology and hematology care.   If you have a lab appointment with the Cancer Center, please go directly to the Cancer Center and check in at the registration area.   Wear comfortable clothing and clothing appropriate for easy access to any Portacath or PICC line.   We strive to give you quality time with your provider. You may need to reschedule your appointment if you arrive late (15 or more minutes).  Arriving late affects you and other patients whose appointments are after yours.  Also, if you miss three or more appointments without notifying the office, you may be dismissed from the clinic at the provider's discretion.      For prescription refill requests, have your pharmacy contact our office and allow 72 hours for refills to be completed.    Today you received the following chemotherapy and/or immunotherapy agents: Pembrolizumab (Keytruda) and Pemetrexed (Alimta)      To help prevent nausea and vomiting after your treatment, we encourage you to take your nausea medication as directed.  BELOW ARE SYMPTOMS THAT SHOULD BE REPORTED IMMEDIATELY: *FEVER GREATER THAN 100.4 F (38 C) OR HIGHER *CHILLS OR SWEATING *NAUSEA AND VOMITING THAT IS NOT CONTROLLED WITH YOUR NAUSEA MEDICATION *UNUSUAL SHORTNESS OF BREATH *UNUSUAL BRUISING OR BLEEDING *URINARY PROBLEMS (pain or burning when urinating, or frequent urination) *BOWEL PROBLEMS (unusual diarrhea, constipation, pain near the anus) TENDERNESS IN MOUTH AND THROAT WITH OR WITHOUT PRESENCE OF ULCERS (sore throat, sores in mouth, or a toothache) UNUSUAL RASH, SWELLING OR PAIN  UNUSUAL VAGINAL DISCHARGE OR ITCHING   Items with * indicate a potential emergency and should be followed up as soon as possible or go to the Emergency Department if any problems should occur.  Please show the  CHEMOTHERAPY ALERT CARD or IMMUNOTHERAPY ALERT CARD at check-in to the Emergency Department and triage nurse.  Should you have questions after your visit or need to cancel or reschedule your appointment, please contact [facility]  Dept: [phone]  and follow the prompts.  Office hours are 8:00 a.m. to 4:30 p.m. Monday - Friday. Please note that voicemails left after 4:00 p.m. may not be returned until the following business day.  We are closed weekends and major holidays. You have access to a nurse at all times for urgent questions. Please call the main number to the clinic Dept: [phone] and follow the prompts.   For any non-urgent questions, you may also contact your provider using MyChart. We now offer e-Visits for anyone 12 and older to request care online for non-urgent symptoms. For details visit [email or web address].   Also download the MyChart app! Go to the app store, search "MyChart", open the app, select [facility], and log in with your MyChart username and password.

## 2023-11-02 NOTE — Progress Notes (Signed)
 [facility] [phone]   [phone]  OFFICE PROGRESS NOTE  Calton Catholic, [name] [address] [place] [address]  DIAGNOSIS:  Stage IV (T1c, N0, M1b) Non-Small Cell Lung Cancer, adenocarcinoma. She presented with a spiculated left upper lobe lung nodule and large right adrenal gland mass. She was diagnosed in July 2024. She had molecular studies that showed she has KRASG12C which can be used in the second line setting.    PDL1: 1%   PRIOR THERAPY: SBRT to the left upper lobe and right adrenal targets under the care of Dr. Jeryl Moris.  Last dose of treatment expected on 02/20/2023   CURRENT THERAPY: Systemic chemotherapy with carboplatin  for AUC of 5, Alimta 500 Mg/M2 and Keytruda  200 Mg IV every 3 weeks.  First dose April 05, 2023.Starting cycle #5 she has been on maintenance treatment with Alimta and Keytruda  every 3 weeks.  Status post 10 cycles  INTERVAL HISTORY: Erin Pacheco 73 y.o. female returns to the clinic today for follow-up visit accompanied by her daughter. Discussed the use of AI scribe software for clinical note transcription with the patient, who gave verbal consent to proceed.  History of Present Illness   Erin Pacheco is a 73 year old female with stage four non-small cell lung cancer who presents for evaluation before starting cycle number eleven of maintenance treatment. She is accompanied by her daughter.  She was diagnosed with stage four non-small cell lung cancer, adenocarcinoma, in July 2024, with a positive KRAS G12C mutation and a PD-L1 expression of 1%. Initially, she underwent palliative systemic chemotherapy with carboplatin , Alimta, and Keytruda  for four cycles. Since cycle five, she has been on maintenance treatment with Alimta and Keytruda  every three weeks.  Recently, she experienced vertigo and visited the emergency room, where a head scan was performed and found to be normal. During this visit,  she was diagnosed with a urinary tract infection and was prescribed antibiotics, which she has been taking for the past ten days. She feels better now, with no burning sensation during urination, fever, or chills.  No chest pain, breathing issues, blood in stool or urine, or hemoptysis. She has a history of a brain lesion.       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  MEDICATIONS:  Current Outpatient Medications  Medication Sig Dispense Refill   acetaminophen  (TYLENOL ) 325 MG tablet Take 2 tablets (650 mg total) by mouth every 6 (six) hours as needed for mild pain (or Fever >/= 101).     apixaban  (ELIQUIS ) 5 MG TABS tablet Take 1 tablet (5 mg total) by mouth 2 (two) times daily. 60 tablet 3   apixaban  (ELIQUIS ) 5 MG TABS tablet Take 1 tablet (5 mg total) by mouth 2 (two) times daily. 42 tablet 0   cephALEXin (KEFLEX) 500 MG capsule Take 1 capsule (500 mg total) by mouth 4 (four) times daily. 20 capsule 0   Fluticasone -Umeclidin-Vilant (TRELEGY ELLIPTA ) 100-62.5-25 MCG/ACT AEPB Inhale 1 each into the lungs daily.     folic acid  (FOLVITE ) 1 MG tablet Take 1 tablet (1 mg total) by mouth daily. Start 7 days  before pemetrexed  chemotherapy. Continue until 21 days after pemetrexed  completed. 100 tablet 3   lidocaine -prilocaine  (EMLA ) cream APPLY TO THE PORT-A-CATH SITE 30 MINUTES BEFORE TREATMENT. 30 g 0   meclizine (ANTIVERT) 25 MG tablet Take 1 tablet (25 mg total) by mouth 3 (three) times daily as needed for dizziness. 14 tablet 0   metoprolol  succinate (TOPROL -XL) 50 MG 24 hr tablet TAKE 1 TABLET BY MOUTH EVERY DAY WITH OR IMMEDIATELY FOLLOWING A MEAL 90 tablet 3   Multiple Vitamin (MULTIVITAMIN) tablet Take 1  tablet by mouth daily.       ondansetron  (ZOFRAN ) 8 MG tablet Take 1 tablet (8 mg total) by mouth every 8 (eight) hours as needed for nausea or vomiting. 30 tablet 0   oxyCODONE  (OXY IR/ROXICODONE ) 5 MG immediate release tablet Take 1 tablet (5 mg total) by mouth every 6 (six) hours as needed for moderate pain. 60 tablet 0   pantoprazole  (PROTONIX ) 40 MG tablet Take 1 tablet (40 mg total) by mouth daily before breakfast. 30 tablet 1   Polyethyl Glycol-Propyl Glycol (SYSTANE OP) Place 1 drop into both eyes daily as needed (dry eye).     prochlorperazine  (COMPAZINE ) 10 MG tablet Take 1 tablet (10 mg total) by mouth every 6 (six) hours as needed for nausea or vomiting. 30 tablet 1   sacubitril-valsartan (ENTRESTO ) 49-51 MG TAKE 1 TABLET BY MOUTH TWICE A DAY 60 tablet 11   spironolactone  (ALDACTONE ) 25 MG tablet Take 0.5 tablets (12.5 mg total) by mouth daily. Take half a tablet once daily. 45 tablet 3   No current facility-administered medications for this visit.   Facility-Administered Medications Ordered in Other Visits  Medication Dose Route Frequency Provider Last Rate Last Admin   heparin  lock flush 100 unit/mL  500 Units Intracatheter Once Marlene Simas, MD       sodium chloride  flush (NS) 0.9 % injection 10 mL  10 mL Intracatheter Once Marlene Simas, MD        SURGICAL HISTORY:  Past Surgical History:  Procedure Laterality Date   ABDOMINAL HYSTERECTOMY     BILATERAL OOPHORECTOMY     BIOPSY  12/09/2022   Procedure: BIOPSY;  Surgeon: Daina Drum, MD;  Location: [facility] ENDOSCOPY;  Service: Gastroenterology;;   BRONCHIAL BIOPSY  01/09/2023   Procedure: BRONCHIAL BIOPSIES;  Surgeon: Prudy Brownie, DO;  Location: [facility] ENDOSCOPY;  Service: Pulmonary;;   BRONCHIAL NEEDLE ASPIRATION BIOPSY  01/09/2023   Procedure: BRONCHIAL NEEDLE ASPIRATION BIOPSIES;  Surgeon: Prudy Brownie, DO;  Location: [facility] ENDOSCOPY;  Service: Pulmonary;;   CATARACT EXTRACTION W/PHACO  12/27/2010   Procedure: CATARACT  EXTRACTION PHACO AND INTRAOCULAR LENS PLACEMENT (IOC);  Surgeon: Anner Kill;  Location: [facility] ORS;  Service: Ophthalmology;  Laterality: Right;   CATARACT EXTRACTION W/PHACO  03/28/2011   Procedure: CATARACT EXTRACTION PHACO AND INTRAOCULAR LENS PLACEMENT (IOC);  Surgeon: Anner Kill;  Location: [facility] ORS;  Service: Ophthalmology;  Laterality: Left;  CDE 7.27   ENTEROSCOPY N/A 02/28/2023   Procedure: ENTEROSCOPY;  Surgeon: Kenney Peacemaker, MD;  Location: [facility] ENDOSCOPY;  Service: Gastroenterology;  Laterality: N/A;   ESOPHAGOGASTRODUODENOSCOPY (EGD) WITH PROPOFOL  N/A 12/09/2022   Procedure: ESOPHAGOGASTRODUODENOSCOPY (EGD) WITH PROPOFOL ;  Surgeon: Daina Drum, MD;  Location: [facility] ENDOSCOPY;  Service: Gastroenterology;  Laterality: N/A;   HOT HEMOSTASIS N/A 02/28/2023   Procedure: HOT HEMOSTASIS (ARGON PLASMA COAGULATION/BICAP);  Surgeon: Kenney Peacemaker, MD;  Location: [facility];  Service: Gastroenterology;  Laterality: N/A;   IR IMAGING GUIDED PORT INSERTION  03/23/2023  SUBMUCOSAL TATTOO INJECTION  02/28/2023   Procedure: SUBMUCOSAL TATTOO INJECTION;  Surgeon: Kenney Peacemaker, MD;  Location: [facility] ENDOSCOPY;  Service: Gastroenterology;;    REVIEW OF SYSTEMS:  Constitutional: negative Eyes: negative Ears, nose, mouth, throat, and face: negative Respiratory: negative Cardiovascular: negative Gastrointestinal: negative Genitourinary:negative Integument/breast: negative Hematologic/lymphatic: negative Musculoskeletal:negative Neurological: positive for vertigo Behavioral/Psych: negative Endocrine: negative Allergic/Immunologic: negative   PHYSICAL EXAMINATION: General appearance: alert, cooperative, and no distress Head: Normocephalic, without obvious abnormality, atraumatic Neck: no adenopathy, no JVD, supple, symmetrical, trachea midline, and thyroid  not enlarged, symmetric, no tenderness/mass/nodules Lymph nodes: Cervical, supraclavicular, and axillary nodes normal. Resp: clear to auscultation  bilaterally Back: symmetric, no curvature. ROM normal. No CVA tenderness. Cardio: regular rate and rhythm, S1, S2 normal, no murmur, click, rub or gallop GI: soft, non-tender; bowel sounds normal; no masses,  no organomegaly Extremities: extremities normal, atraumatic, no cyanosis or edema Neurologic: Alert and oriented X 3, normal strength and tone. Normal symmetric reflexes. Normal coordination and gait  ECOG PERFORMANCE STATUS: 1 - Symptomatic but completely ambulatory  Blood pressure (!) 119/59, pulse 69, temperature (!) 97.3 F (36.3 C), temperature source Temporal, resp. rate 16, weight 137 lb 11.2 oz (62.5 kg), SpO2 100%.  LABORATORY DATA: Lab Results  Component Value Date   WBC 4.3 11/02/2023   HGB 10.8 (L) 11/02/2023   HCT 32.4 (L) 11/02/2023   MCV 97.6 11/02/2023   PLT 341 11/02/2023      Chemistry      Component Value Date/Time   NA 138 10/24/2023 1708   NA 137 09/27/2023 1224   K 3.7 10/24/2023 1708   CL 103 10/24/2023 1708   CO2 28 10/24/2023 1708   BUN 19 10/24/2023 1708   BUN 26 09/27/2023 1224   CREATININE 1.27 (H) 10/24/2023 1708   CREATININE 1.22 (H) 10/12/2023 1359      Component Value Date/Time   CALCIUM 8.8 (L) 10/24/2023 1708   ALKPHOS 133 (H) 10/24/2023 1708   AST 32 10/24/2023 1708   AST 32 10/12/2023 1359   ALT 24 10/24/2023 1708   ALT 21 10/12/2023 1359   BILITOT 0.5 10/24/2023 1708   BILITOT 0.4 10/12/2023 1359       RADIOGRAPHIC STUDIES: CT Head Wo Contrast Result Date: 10/24/2023 CLINICAL DATA:  Dizziness and near syncopal episode EXAM: CT HEAD WITHOUT CONTRAST TECHNIQUE: Contiguous axial images were obtained from the base of the skull through the vertex without intravenous contrast. RADIATION DOSE REDUCTION: This exam was performed according to the departmental dose-optimization program which includes automated exposure control, adjustment of the mA and/or kV according to patient size and/or use of iterative reconstruction technique.  COMPARISON:  08/30/2017 FINDINGS: Brain: No evidence of acute infarction, hemorrhage, hydrocephalus, extra-axial collection or mass lesion/mass effect. Vascular: No hyperdense vessel or unexpected calcification. Skull: Normal. Negative for fracture or focal lesion. Sinuses/Orbits: No acute finding. Other: None. IMPRESSION: No acute intracranial abnormality noted. Electronically Signed   By: Violeta Grey M.D.   On: 10/24/2023 21:12   DG Chest Portable 1 View Result Date: 10/24/2023 CLINICAL DATA:  Near syncope. EXAM: PORTABLE CHEST 1 VIEW COMPARISON:  01/17/2023. FINDINGS: Bilateral lung fields are clear. Bilateral costophrenic angles are clear. Normal cardio-mediastinal silhouette. No acute osseous abnormalities. The soft tissues are within normal limits. Right-sided CT Port-A-Cath is seen with its tip overlying the lower portion of superior vena cava. IMPRESSION: *No active disease. Electronically Signed   By: Beula Brunswick M.D.   On: 10/24/2023 18:56   CT CHEST ABDOMEN PELVIS  W CONTRAST Result Date: 10/10/2023 CLINICAL DATA:  Non-small-cell lung cancer, restaging. * Tracking Code: BO * EXAM: CT CHEST, ABDOMEN, AND PELVIS WITH CONTRAST TECHNIQUE: Multidetector CT imaging of the chest, abdomen and pelvis was performed following the standard protocol during bolus administration of intravenous contrast. RADIATION DOSE REDUCTION: This exam was performed according to the departmental dose-optimization program which includes automated exposure control, adjustment of the mA and/or kV according to patient size and/or use of iterative reconstruction technique. CONTRAST:  OMNIPAQUE  IOHEXOL  300 MG/ML  SOLN COMPARISON:  08/03/2023 FINDINGS: CT CHEST FINDINGS Cardiovascular: Right Port-A-Cath tip high right atrium. Aortic atherosclerosis. Tortuous thoracic aorta. Mild cardiomegaly, without pericardial effusion. Three vessel coronary artery calcification. No central pulmonary embolism, on this non-dedicated study.  Mediastinum/Nodes: No supraclavicular adenopathy. No mediastinal or hilar adenopathy. Lungs/Pleura: Trace right pleural fluid. Moderate centrilobular emphysema. Right lower lobe volume loss. Central left upper lobe pulmonary nodule measures 13 x 13 mm on 46/4 is felt to be similar to 14 x 11 mm on the prior. Surrounding interstitial thickening is presumably treatment related, increased. Subpleural lingular 7 mm nodule on 82/4 is similar to on the prior (when remeasured). 6 mm superior segment left lower lobe ground-glass nodule is new on 67/4. Musculoskeletal: Included within the abdomen pelvic section. CT ABDOMEN PELVIS FINDINGS Hepatobiliary: Limited evaluation of the upper abdomen secondary to arm position, not raised above the head. No suspicious liver lesion. Gallbladder is contracted. No biliary duct dilatation. Pancreas: Normal, without mass or ductal dilatation. Spleen: Normal in size, without focal abnormality. Adrenals/Urinary Tract: Normal left adrenal gland. Right adrenal metastasis measures 5.6 x 3.4 cm on 54/2 versus 6.0 x 3.3 cm on the prior, suggesting stability. Upper pole right renal 12 mm cyst . In the absence of clinically indicated signs/symptoms require(s) no independent follow-up. Normal left kidney. No hydronephrosis. Normal urinary bladder. Stomach/Bowel: Proximal gastric underdistention. Normal colon and terminal ileum. Normal appendix. Normal small bowel. Vascular/Lymphatic: Advanced aortic and branch vessel atherosclerosis. No abdominopelvic adenopathy. Reproductive: Hysterectomy.  No adnexal mass. Other: No significant free fluid. Small bilateral fat containing inguinal hernias. No free intraperitoneal air. No evidence of omental or peritoneal disease. Musculoskeletal: Lumbosacral spondylosis. IMPRESSION: 1. Similar treated left upper lobe primary with increased surrounding interstitial thickening. 2. No thoracic adenopathy. 3. Similar right adrenal metastasis. 4. Similar nonspecific 7  mm lingular nodule. A 6 mm left lower lobe ground-glass nodule is new and may be infectious/inflammatory. 5. Incidental findings, including: Similar trace right pleural fluid. Aortic atherosclerosis (ICD10-I70.0), coronary artery atherosclerosis and emphysema (ICD10-J43.9). Electronically Signed   By: Lore Rode M.D.   On: 10/10/2023 20:09      ASSESSMENT AND PLAN: This is a very pleasant 73 years old African-American female recently diagnosed with a stage IV (T1c, N0, M1b) non-small cell lung cancer, adenocarcinoma with positive KRAS G12C mutation and PD-L1 expression of 1%.  She started systemic chemotherapy with carboplatin  for AUC of 5, Alimta 500 Mg/M2 and Keytruda  200 Mg IV every 3 weeks.  She is status post 10 cycle of her treatment. The patient has been tolerating this treatment well with no concerning adverse effects.    Stage 4 non-small cell lung cancer Stage 4 non-small cell lung cancer, adenocarcinoma subtype, with positive KRAS G12C mutation. Currently on maintenance treatment with Alimta and Keytruda  after initial chemotherapy with carboplatin , Alimta, and Keytruda . No new symptoms such as chest pain, dyspnea, hemoptysis, or gastrointestinal bleeding. She is well-managed and ready to proceed with cycle eleven of maintenance therapy. - Proceed  with cycle eleven of maintenance therapy with Alimta and Keytruda . - Consult neuro-oncologist Dr. Mark Sil regarding driving safety due to previous brain lesion.  Vertigo Recent episode of vertigo leading to emergency department visit. Brain imaging was normal. Vertigo may impact driving ability, and further guidance from neuro-oncologist is advised. - Consult neuro-oncologist Dr. Mark Sil regarding vertigo and driving safety.  Urinary tract infection Recent urinary tract infection diagnosed approximately ten days ago, treated with antibiotics. No current symptoms such as fever, chills, or dysuria. She reports no burning sensation during  urination.   She was advised to call immediately if she has any other concerning symptoms in the interval. The patient voices understanding of current disease status and treatment options and is in agreement with the current care plan.  All questions were answered. The patient knows to call the clinic with any problems, questions or concerns. We can certainly see the patient much sooner if necessary. The total time spent in the appointment was 30 minutes.  Disclaimer: This note was dictated with voice recognition software. Similar sounding words can inadvertently be transcribed and may not be corrected upon review.

## 2023-11-05 ENCOUNTER — Other Ambulatory Visit: Payer: Self-pay

## 2023-11-05 DIAGNOSIS — I5022 Chronic systolic (congestive) heart failure: Secondary | ICD-10-CM | POA: Insufficient documentation

## 2023-11-05 NOTE — Progress Notes (Unsigned)
 Cardiology Office Note:   Date:  11/07/2023  ID:  Erin LYTTLE, DOB [birth year], MRN [id number] PCP: Erin Pacheco  [facility] Providers Cardiologist:  Erin Grates, MD {  History of Present Illness:   Erin Pacheco is a 73 y.o. female  with PMH of tobacco abuse and HTN who I saw last year in the hospital with chest pain.  And was  found to have PE, LUL lung mass and R adrenal mass on CTA of chest. Cardiology service consulted as her EF came back 30-35% on echo. 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  MRCP revealed large right adrenal mass with evidence of hemorrhage.  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   She has been seen multiple times in our clinic for follow up but this is the first time I am seeing her since the hospital.    From a cardiovascular standpoint she has been doing okay.  She is not describing PND or orthopnea.  She is not been having any palpitations, presyncope or syncope.  She has had no weight gain or edema.  She has had "cold" for 2 weeks.  She is coughing up some white phlegm.  I do see that she has she had a chest x-ray on 5/20 at the cancer center.  This was clear.  She is not describing fevers or chills.  She has not had congestion.  She is not having any new chest pressure, neck or arm discomfort.  She had some edema but was started back on spironolactone  and this is resolved.   ROS: As stated in the HPI and negative for all other systems.  Studies Reviewed:    EKG:   NA   Risk Assessment/Calculations:              Physical Exam:   VS:  BP (!) 106/58 (BP Location: Right Arm, Patient Position: Sitting, Cuff Size: Normal)   Pulse 85    Ht 5\' 3"  (1.6 m)   Wt 133 lb (60.3 kg)   SpO2 93%   BMI 23.56 kg/m    Wt Readings from Last 3 Encounters:  11/07/23 133 lb (60.3 kg)  11/02/23 137 lb 11.2 oz (62.5 kg)  10/24/23 148 lb 13 oz (67.5 kg)     GEN: Well nourished, well developed in no acute distress NECK: No JVD; No carotid bruits CARDIAC: RRR, no murmurs, rubs, gallops RESPIRATORY:  Clear to auscultation without rales, wheezing or rhonchi  ABDOMEN: Soft, non-tender, non-distended EXTREMITIES:  No edema; No deformity   ASSESSMENT AND PLAN:   LV dysfunction  / Reduced Ejection Fraction (EF 30-35%):      She is on GDMT.  Blood pressure would not allow med titration.  No change in therapy.  Anemia: Her hemoglobin is still slightly low but above previous.  She has no active bleeding.  No change in therapy.  Anticoagulation for History of Pulmonary Embolism (PE): She tolerates anticoagulation.  No change in therapy.  Hypertension: Blood pressure is being managed in the context of managing her cardiomyopathy.  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 SBRT to the left upper lobe and right adrenal targets under the care of Dr. Jeryl Moris.  Last dose of treatment  expected on 02/20/2023.    She has been treated with systemic chemotherapy with carboplatin  for AUC of 5, Alimta 500 Mg/M2 and Keytruda  200 Mg IV every 3 weeks.  First dose April 05, 2023.Starting cycle #5 she has been on maintenance treatment with Alimta and Keytruda  every 3 weeks.  Status post 10 cycles  Cough: I do not think this is related to volume overload.  I have asked her to call and schedule with her primary provider.  I do not see or hear evidence of active pneumonia or pulmonary process.   Follow up with Hao Meng in 6 months.   Signed, Erin Grates, MD

## 2023-11-07 ENCOUNTER — Telehealth: Payer: Self-pay | Admitting: Pharmacy Technician

## 2023-11-07 ENCOUNTER — Encounter: Payer: Self-pay | Admitting: Physician Assistant

## 2023-11-07 ENCOUNTER — Telehealth: Payer: Self-pay

## 2023-11-07 ENCOUNTER — Encounter: Payer: Self-pay | Admitting: Cardiology

## 2023-11-07 ENCOUNTER — Ambulatory Visit: Payer: Medicare HMO | Attending: Cardiology | Admitting: Cardiology

## 2023-11-07 ENCOUNTER — Encounter: Payer: Self-pay | Admitting: Internal Medicine

## 2023-11-07 ENCOUNTER — Other Ambulatory Visit (HOSPITAL_COMMUNITY): Payer: Self-pay

## 2023-11-07 VITALS — BP 106/58 | HR 85 | Ht 63.0 in | Wt 133.0 lb

## 2023-11-07 DIAGNOSIS — I5022 Chronic systolic (congestive) heart failure: Secondary | ICD-10-CM

## 2023-11-07 DIAGNOSIS — I10 Essential (primary) hypertension: Secondary | ICD-10-CM

## 2023-11-07 DIAGNOSIS — Z1330 Encounter for screening examination for mental health and behavioral disorders, unspecified: Secondary | ICD-10-CM | POA: Diagnosis not present

## 2023-11-07 DIAGNOSIS — C3492 Malignant neoplasm of unspecified part of left bronchus or lung: Secondary | ICD-10-CM | POA: Diagnosis not present

## 2023-11-07 DIAGNOSIS — R059 Cough, unspecified: Secondary | ICD-10-CM | POA: Diagnosis not present

## 2023-11-07 MED ORDER — APIXABAN 5 MG PO TABS: 5.0000 mg | ORAL_TABLET | Freq: Two times a day (BID) | ORAL | 0 refills | Status: AC

## 2023-11-07 NOTE — Patient Instructions (Signed)
 Medication Instructions:  Your physician recommends that you continue on your current medications as directed. Please refer to the Current Medication list given to you today.  *If you need a refill on your cardiac medications before your next appointment, please call your pharmacy*  Lab Work: NONE If you have labs (blood work) drawn today and your tests are completely normal, you will receive your results only by: MyChart Message (if you have MyChart) OR A paper copy in the mail If you have any lab test that is abnormal or we need to change your treatment, we will call you to review the results.  Testing/Procedures: NONE  Follow-Up: At [facility], you and your health needs are our priority.  As part of our continuing mission to provide you with exceptional heart care, our providers are all part of one team.  This team includes your primary Cardiologist (physician) and Advanced Practice Providers or APPs (Physician Assistants and Nurse Practitioners) who all work together to provide you with the care you need, when you need it.  Your next appointment:   6 month(s)  Provider:   Hao Meng, [name]  We recommend signing up for the patient portal called "MyChart".  Sign up information is provided on this After Visit Summary.  MyChart is used to connect with patients for Virtual Visits (Telemedicine).  Patients are able to view lab/test results, encounter notes, upcoming appointments, etc.  Non-urgent messages can be sent to your provider as well.   To learn more about what you can do with MyChart, go to [email or web address].

## 2023-11-07 NOTE — Progress Notes (Signed)
 [facility] Erin Pacheco    Date: 11/07/2023 Patient name: Erin Pacheco Date of birth: August 02, 1950   ASSESSMENT/PLAN:  Diagnoses and all orders for this visit: Cough, unspecified type  -  will treat with augmentin and albuterol . Seems more like bronchitis but she is very high risk with her non-small cell cancer. Recent chest xray was clear 5/20. Will cover with augmentin. F/u if not improving  -     amoxicillin-clavulanate (AUGMENTIN) 875-125 mg per tablet; Take one tablet by mouth 2 (two) times daily for 10 days. -     albuterol  sulfate HFA (PROVENTIL ,VENTOLIN ,PROAIR ) 108 (90 Base) MCG/ACT inhaler; Inhale two puffs into the lungs every 4 (four) hours as needed.  Non-small cell cancer of left lung (*)   Follow up if symptoms worsen or fail to improve.    Patient expresses understanding of their current medications and use.  We discussed potential side effects, drug interactions, instructions for taking the medication, and the consequences of not taking it. Patient verbalized an understanding of these instructions. Patient is able to verbalize understanding of the care plan discussed today. Patient's medical and personal goals were discussed today.   Patient was instructed to return sooner if no improvement, and we discussed precautions to seek emergency medical care if condition worsens.    SUBJECTIVE:  [name] is a 73 y.o. female that presents to clinic today regarding the following issues: Cough   She is here with her daughter.  She has been having a dry cough for the past 2 weeks.  She says she thinks it is her sinuses.  She is not having a lot of sinus pain but feels she has had recurrent sinus issues throughout her life.  She was not tested for COVID or flu.  Her daughter was also sick with the same thing.  Of note, she has stage IV non-small cell lung cancer.  She is following with oncology and currently on maintenance treatment with Alimta  and Keytruda   after initial chemotherapy.  She says she does not feel ill.  She is not running any fevers.  She did recently have a  chest x-ray.  She says she discussed that with her cancer doctor and they told her to take some Robitussin which she is taking.       Reviewed and updated this visit by provider: Tobacco  Allergies  Meds  Problems  Med Hx  Surg Hx  Fam Hx        Medications Patient's Medications  New Prescriptions   ALBUTEROL  SULFATE HFA (PROVENTIL ,VENTOLIN ,PROAIR ) 108 (90 BASE) MCG/ACT INHALER    Inhale two puffs into the lungs every 4 (four) hours as needed.   AMOXICILLIN-CLAVULANATE (AUGMENTIN) 875-125 MG PER TABLET    Take one tablet by mouth 2 (two) times daily for 10 days.  Previous Medications   ACETAMINOPHEN  (TYLENOL ) 325 MG TABLET    Take two tablets (650 mg dose) by mouth every 6 (six) hours as needed.   ELIQUIS  5 MG TABLET    Take one tablet (5 mg dose) by mouth 2 (two) times daily.   FERROUS SULFATE 325 (65 FE) MG TABLET    Take one tablet (325 mg dose) by mouth with breakfast.   FLUTICASONE -UMECLIDIN-VILANT (TRELEGY ELLIPTA ) 100-62.5-25 MCG/ACT AEPB INHALER    Inhale one puff into the lungs daily.   LOSARTAN  POTASSIUM (COZAAR ) 50 MG TABLET    Take one tablet (50 mg dose) by mouth daily.   MECLIZINE  HCL (ANTIVERT ) 25 MG TABLET  Take one tablet (25 mg dose) by mouth 3 (three) times a day as needed.   METOPROLOL  SUCCINATE (TOPROL -XL) 50 MG 24 HR TABLET    Take one tablet (50 mg dose) by mouth daily.   MULTIPLE VITAMIN (MULTIVITAMIN) TABLET    Take one tablet by mouth daily.   OXYCODONE  HCL (ROXICODONE ) 5 MG IMMEDIATE RELEASE TABLET    Take one tablet (5 mg dose) by mouth every 6 (six) hours as needed.   PANTOPRAZOLE  SODIUM (PROTONIX ) 40 MG TABLET    Take one tablet (40 mg dose) by mouth daily.   SPIRONOLACTONE  (ALDACTONE ) 25 MG TABLET    Take one half tablet (12.5 mg dose) by mouth daily.  Modified Medications   No medications on file  Discontinued Medications   No  medications on file      ROS  Review of Systems  Constitutional: Negative.   HENT: Negative.    Eyes: Negative.   Respiratory:  Positive for cough.   Cardiovascular: Negative.   Gastrointestinal: Negative.   Endocrine: Negative.   Genitourinary: Negative.   Musculoskeletal: Negative.   Skin: Negative.   Allergic/Immunologic: Negative.   Neurological: Negative.   Hematological: Negative.   Psychiatric/Behavioral: Negative.       Review of Systems - All other systems reviewed are negative except as noted above.   OBJECTIVE:  Vitals BP 114/62   Pulse 80   Temp 98.4 F (36.9 C) (Temporal)   Resp 16   Wt 133 lb 12.8 oz (60.7 kg)   SpO2 94%   BMI 24.47 kg/m    PHYSICAL:  General Appearance: Alert, oriented and appears in no acute distress. Sitting comfortably in chair. Head: Normocephalic, without obvious abnormality, atraumatic.  HENT: Oral mucosa moist without erythema or exudates. Nasal turbinates without erythema, edema or purulent rhinorrhea. TMs intact bilaterally, without erythema, effusion or exudate. Neck: Supple, no adenopathy Respiratory: Good air movement throughout. No accessory muscle use, increased work of breathing or respiratory distress. Clear to auscultation in all lung fields. Normal effort of breathing, non-labored. No adventitious lung sounds including wheezes, rhonchi or crackles. Cardiovascular: Regular rate and rhythm, S1, S2 normal, no murmur. No heaves, lifts or thrills palpable. Extremities: No peripheral edema.  Skin: Warm, dry.  Psychiatric: Pleasant. Mood and affect normal. Not anxious or tearful. Neurological: AOx3. Gait normal.    Joesph Pacheco Cedar, [name]  [facility]  11/07/2023 3:31 PM

## 2023-11-07 NOTE — Telephone Encounter (Signed)
 Medication name/dosage: Samples List: Eliquis  5 mg  Administration instructions: Take one tablet 5 mg twice daily  Reason for samples: Reason for samples: unable to afford medication  Ordering provider: Lavonne Prairie, MD  *Once above information entered, route the phone encounter to CV DIV MAG [facility] SAMPLES and send Teams message to team member assigned to Samples for the day.

## 2023-11-07 NOTE — Telephone Encounter (Signed)
 Eliquis  5 mg PO twice daily sample given to patient for 2 weeks.

## 2023-11-07 NOTE — Telephone Encounter (Signed)
       Spoke to the daughter and she said she will let u know what she wants to do when she runs out of the eliquis . She is aware of the cost of the options

## 2023-11-07 NOTE — Addendum Note (Signed)
 Addended by: Elyn Han on: 11/07/2023 02:35 PM   Modules accepted: Orders

## 2023-11-23 ENCOUNTER — Encounter: Payer: Self-pay | Admitting: Internal Medicine

## 2023-11-23 ENCOUNTER — Inpatient Hospital Stay (HOSPITAL_BASED_OUTPATIENT_CLINIC_OR_DEPARTMENT_OTHER): Attending: Physician Assistant | Admitting: Internal Medicine

## 2023-11-23 ENCOUNTER — Inpatient Hospital Stay

## 2023-11-23 ENCOUNTER — Inpatient Hospital Stay: Attending: Physician Assistant

## 2023-11-23 ENCOUNTER — Other Ambulatory Visit: Payer: Self-pay | Admitting: Internal Medicine

## 2023-11-23 VITALS — BP 131/59 | HR 61 | Temp 98.1°F | Resp 16 | Wt 136.2 lb

## 2023-11-23 DIAGNOSIS — Z5112 Encounter for antineoplastic immunotherapy: Secondary | ICD-10-CM | POA: Diagnosis present

## 2023-11-23 DIAGNOSIS — C3412 Malignant neoplasm of upper lobe, left bronchus or lung: Secondary | ICD-10-CM | POA: Diagnosis not present

## 2023-11-23 DIAGNOSIS — Z79620 Long term (current) use of immunosuppressive biologic: Secondary | ICD-10-CM | POA: Diagnosis not present

## 2023-11-23 DIAGNOSIS — C3490 Malignant neoplasm of unspecified part of unspecified bronchus or lung: Secondary | ICD-10-CM | POA: Diagnosis not present

## 2023-11-23 DIAGNOSIS — E279 Disorder of adrenal gland, unspecified: Secondary | ICD-10-CM | POA: Diagnosis not present

## 2023-11-23 DIAGNOSIS — T50995S Adverse effect of other drugs, medicaments and biological substances, sequela: Secondary | ICD-10-CM | POA: Diagnosis not present

## 2023-11-23 LAB — CMP (CANCER CENTER ONLY)
ALT: 20 U/L (ref 0–44)
AST: 30 U/L (ref 15–41)
Albumin: 3.1 g/dL — ABNORMAL LOW (ref 3.5–5.0)
Alkaline Phosphatase: 119 U/L (ref 38–126)
Anion gap: 6 (ref 5–15)
BUN: 22 mg/dL (ref 8–23)
CO2: 27 mmol/L (ref 22–32)
Calcium: 9.1 mg/dL (ref 8.9–10.3)
Chloride: 105 mmol/L (ref 98–111)
Creatinine: 1.33 mg/dL — ABNORMAL HIGH (ref 0.44–1.00)
GFR, Estimated: 43 mL/min — ABNORMAL LOW (ref >60)
Glucose, Bld: 138 mg/dL — ABNORMAL HIGH (ref 70–99)
Potassium: 3.9 mmol/L (ref 3.5–5.1)
Sodium: 138 mmol/L (ref 135–145)
Total Bilirubin: 0.3 mg/dL (ref 0.0–1.2)
Total Protein: 7.8 g/dL (ref 6.5–8.1)

## 2023-11-23 LAB — CBC WITH DIFFERENTIAL (CANCER CENTER ONLY)
Abs Immature Granulocytes: 0.03 10*3/uL (ref 0.00–0.07)
Basophils Absolute: 0 10*3/uL (ref 0.0–0.1)
Basophils Relative: 1 %
Eosinophils Absolute: 0.1 10*3/uL (ref 0.0–0.5)
Eosinophils Relative: 2 %
HCT: 29.8 % — ABNORMAL LOW (ref 36.0–46.0)
Hemoglobin: 9.9 g/dL — ABNORMAL LOW (ref 12.0–15.0)
Immature Granulocytes: 1 %
Lymphocytes Relative: 29 %
Lymphs Abs: 1.3 10*3/uL (ref 0.7–4.0)
MCH: 32.6 pg (ref 26.0–34.0)
MCHC: 33.2 g/dL (ref 30.0–36.0)
MCV: 98 fL (ref 80.0–100.0)
Monocytes Absolute: 0.6 10*3/uL (ref 0.1–1.0)
Monocytes Relative: 13 %
Neutro Abs: 2.4 10*3/uL (ref 1.7–7.7)
Neutrophils Relative %: 54 %
Platelet Count: 306 10*3/uL (ref 150–400)
RBC: 3.04 MIL/uL — ABNORMAL LOW (ref 3.87–5.11)
RDW: 16.5 % — ABNORMAL HIGH (ref 11.5–15.5)
WBC Count: 4.3 10*3/uL (ref 4.0–10.5)
nRBC: 0 % (ref 0.0–0.2)

## 2023-11-23 LAB — TSH: TSH: 1.79 u[IU]/mL (ref 0.350–4.500)

## 2023-11-23 MED ORDER — HEPARIN SOD (PORK) LOCK FLUSH 100 UNIT/ML IV SOLN
500.0000 [IU] | Freq: Once | INTRAVENOUS | Status: AC | PRN
  Administered 2023-11-23: 500 [IU]

## 2023-11-23 MED ORDER — SODIUM CHLORIDE 0.9 % IV SOLN: Freq: Once | INTRAVENOUS | Status: AC

## 2023-11-23 MED ORDER — CYANOCOBALAMIN 1000 MCG/ML IJ SOLN
1000.0000 ug | Freq: Once | INTRAMUSCULAR | Status: AC
  Administered 2023-11-23: 1000 ug via INTRAMUSCULAR
  Filled 2023-11-23: qty 1

## 2023-11-23 MED ORDER — PROCHLORPERAZINE MALEATE 10 MG PO TABS
10.0000 mg | ORAL_TABLET | Freq: Once | ORAL | Status: AC
  Administered 2023-11-23: 10 mg via ORAL
  Filled 2023-11-23: qty 1

## 2023-11-23 MED ORDER — SODIUM CHLORIDE 0.9 % IV SOLN
200.0000 mg | Freq: Once | INTRAVENOUS | Status: AC
  Administered 2023-11-23: 200 mg via INTRAVENOUS
  Filled 2023-11-23: qty 200

## 2023-11-23 MED ORDER — SODIUM CHLORIDE 0.9% FLUSH
10.0000 mL | INTRAVENOUS | Status: DC | PRN
  Administered 2023-11-23: 10 mL

## 2023-11-23 NOTE — Progress Notes (Signed)
 [facility] [phone]   [phone]  OFFICE PROGRESS NOTE  Calton Catholic, [name] [address] [place] [address]  DIAGNOSIS:  Stage IV (T1c, N0, M1b) Non-Small Cell Lung Cancer, adenocarcinoma. She presented with a spiculated left upper lobe lung nodule and large right adrenal gland mass. She was diagnosed in July 2024. She had molecular studies that showed she has KRASG12C which can be used in the second line setting.    PDL1: 1%   PRIOR THERAPY: SBRT to the left upper lobe and right adrenal targets under the care of Dr. Jeryl Moris.  Last dose of treatment expected on 02/20/2023   CURRENT THERAPY: Systemic chemotherapy with carboplatin  for AUC of 5, Alimta 500 Mg/M2 and Keytruda  200 Mg IV every 3 weeks.  First dose April 05, 2023.Starting cycle #5 she has been on maintenance treatment with Alimta and Keytruda  every 3 weeks.  Status post 11 cycles  INTERVAL HISTORY: Erin Pacheco 73 y.o. female returns to the clinic today for follow-up visit accompanied by her daughter. Discussed the use of AI scribe software for clinical note transcription with the patient, who gave verbal consent to proceed.  History of Present Illness   Erin Pacheco is a 73 year old female with stage four non-small cell lung cancer who presents for cycle twelve of palliative systemic chemotherapy.  Diagnosed with stage four non-small cell lung cancer, adenocarcinoma, in July 2024, characterized by a positive KRAS G12c mutation and a PD-L1 expression of one percent. Currently undergoing palliative systemic chemotherapy, initially started with Systemic chemotherapy with carboplatin  for AUC of 5, Alimta 500 Mg/M2 and Keytruda  200 Mg IV every 3 weeks.  First dose April 05, 2023.Starting cycle #5 she has been on maintenance treatment with Alimta and Keytruda  every 3 weeks.  Status post 11 cycles   Feels okay overall with no nausea, vomiting, diarrhea, fever, or  chills since last visit. Experienced a headache yesterday but no ongoing headaches or changes in vision such as diplopia. Notes some numbness in her hand.        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  MEDICATIONS:  Current Outpatient Medications  Medication Sig Dispense Refill   acetaminophen  (TYLENOL ) 325 MG tablet Take 2 tablets (650 mg total) by mouth every 6 (six) hours as needed for mild pain (or Fever >/= 101).     apixaban  (ELIQUIS ) 5 MG TABS tablet Take 1 tablet (5 mg total) by mouth 2 (two) times daily. 60 tablet 3   apixaban  (ELIQUIS ) 5 MG TABS tablet Take 1 tablet (5 mg total) by mouth 2 (two) times daily. 42 tablet 0   apixaban  (ELIQUIS ) 5 MG TABS tablet Take 1 tablet (5 mg total) by mouth 2 (two) times daily. 28 tablet 0   cephALEXin  (KEFLEX ) 500 MG capsule Take 1 capsule (500 mg total) by mouth 4 (four) times daily. 20 capsule 0   Fluticasone -Umeclidin-Vilant (TRELEGY ELLIPTA ) 100-62.5-25 MCG/ACT AEPB Inhale 1 each into the lungs daily.     folic acid  (FOLVITE ) 1 MG tablet Take 1 tablet (1 mg total) by mouth daily. Start 7 days before pemetrexed  chemotherapy. Continue until  21 days after pemetrexed  completed. 100 tablet 3   lidocaine -prilocaine  (EMLA ) cream APPLY TO THE PORT-A-CATH SITE 30 MINUTES BEFORE TREATMENT. 30 g 0   meclizine  (ANTIVERT ) 25 MG tablet Take 1 tablet (25 mg total) by mouth 3 (three) times daily as needed for dizziness. 14 tablet 0   metoprolol  succinate (TOPROL -XL) 50 MG 24 hr tablet TAKE 1 TABLET BY MOUTH EVERY DAY WITH OR IMMEDIATELY FOLLOWING A MEAL 90 tablet 3   Multiple Vitamin (MULTIVITAMIN) tablet Take 1 tablet by mouth daily.       ondansetron  (ZOFRAN ) 8 MG tablet  Take 1 tablet (8 mg total) by mouth every 8 (eight) hours as needed for nausea or vomiting. 30 tablet 0   oxyCODONE  (OXY IR/ROXICODONE ) 5 MG immediate release tablet Take 1 tablet (5 mg total) by mouth every 6 (six) hours as needed for moderate pain. 60 tablet 0   pantoprazole  (PROTONIX ) 40 MG tablet Take 1 tablet (40 mg total) by mouth daily before breakfast. 30 tablet 1   Polyethyl Glycol-Propyl Glycol (SYSTANE OP) Place 1 drop into both eyes daily as needed (dry eye).     prochlorperazine  (COMPAZINE ) 10 MG tablet Take 1 tablet (10 mg total) by mouth every 6 (six) hours as needed for nausea or vomiting. 30 tablet 1   sacubitril-valsartan (ENTRESTO ) 49-51 MG TAKE 1 TABLET BY MOUTH TWICE A DAY 60 tablet 11   spironolactone  (ALDACTONE ) 25 MG tablet Take 0.5 tablets (12.5 mg total) by mouth daily. Take half a tablet once daily. 45 tablet 3   No current facility-administered medications for this visit.   Facility-Administered Medications Ordered in Other Visits  Medication Dose Route Frequency Provider Last Rate Last Admin   heparin  lock flush 100 unit/mL  500 Units Intracatheter Once Marlene Simas, MD       sodium chloride  flush (NS) 0.9 % injection 10 mL  10 mL Intracatheter Once Marlene Simas, MD        SURGICAL HISTORY:  Past Surgical History:  Procedure Laterality Date   ABDOMINAL HYSTERECTOMY     BILATERAL OOPHORECTOMY     BIOPSY  12/09/2022   Procedure: BIOPSY;  Surgeon: Daina Drum, MD;  Location: [facility] ENDOSCOPY;  Service: Gastroenterology;;   BRONCHIAL BIOPSY  01/09/2023   Procedure: BRONCHIAL BIOPSIES;  Surgeon: Prudy Brownie, DO;  Location: [facility] ENDOSCOPY;  Service: Pulmonary;;   BRONCHIAL NEEDLE ASPIRATION BIOPSY  01/09/2023   Procedure: BRONCHIAL NEEDLE ASPIRATION BIOPSIES;  Surgeon: Prudy Brownie, DO;  Location: [facility] ENDOSCOPY;  Service: Pulmonary;;   CATARACT EXTRACTION W/PHACO  12/27/2010   Procedure: CATARACT EXTRACTION PHACO AND INTRAOCULAR LENS PLACEMENT (IOC);  Surgeon:  Anner Kill;  Location: [facility] ORS;  Service: Ophthalmology;  Laterality: Right;   CATARACT EXTRACTION W/PHACO  03/28/2011   Procedure: CATARACT EXTRACTION PHACO AND INTRAOCULAR LENS PLACEMENT (IOC);  Surgeon: Anner Kill;  Location: [facility] ORS;  Service: Ophthalmology;  Laterality: Left;  CDE 7.27   ENTEROSCOPY N/A 02/28/2023   Procedure: ENTEROSCOPY;  Surgeon: Kenney Peacemaker, MD;  Location: [facility] ENDOSCOPY;  Service: Gastroenterology;  Laterality: N/A;   ESOPHAGOGASTRODUODENOSCOPY (EGD) WITH PROPOFOL  N/A 12/09/2022   Procedure: ESOPHAGOGASTRODUODENOSCOPY (EGD) WITH PROPOFOL ;  Surgeon: Daina Drum, MD;  Location: [facility] ENDOSCOPY;  Service: Gastroenterology;  Laterality: N/A;   HOT HEMOSTASIS N/A 02/28/2023   Procedure: HOT HEMOSTASIS (ARGON PLASMA COAGULATION/BICAP);  Surgeon: Kenney Peacemaker, MD;  Location: [facility];  Service: Gastroenterology;  Laterality: N/A;   IR IMAGING GUIDED PORT INSERTION  03/23/2023   SUBMUCOSAL TATTOO INJECTION  02/28/2023   Procedure: SUBMUCOSAL TATTOO INJECTION;  Surgeon: Kenney Peacemaker, MD;  Location: [facility] ENDOSCOPY;  Service: Gastroenterology;;    REVIEW OF SYSTEMS:  A comprehensive review of systems was negative except for: Constitutional: positive for fatigue   PHYSICAL EXAMINATION: General appearance: alert, cooperative, and no distress Head: Normocephalic, without obvious abnormality, atraumatic Neck: no adenopathy, no JVD, supple, symmetrical, trachea midline, and thyroid  not enlarged, symmetric, no tenderness/mass/nodules Lymph nodes: Cervical, supraclavicular, and axillary nodes normal. Resp: clear to auscultation bilaterally Back: symmetric, no curvature. ROM normal. No CVA tenderness. Cardio: regular rate and rhythm, S1, S2 normal, no murmur, click, rub or gallop GI: soft, non-tender; bowel sounds normal; no masses,  no organomegaly Extremities: extremities normal, atraumatic, no cyanosis or edema  ECOG PERFORMANCE STATUS: 1 - Symptomatic but completely  ambulatory  Blood pressure (!) 131/59, pulse 61, temperature 98.1 F (36.7 C), temperature source Temporal, resp. rate 16, weight 136 lb 3.2 oz (61.8 kg), SpO2 97%.  LABORATORY DATA: Lab Results  Component Value Date   WBC 4.3 11/23/2023   HGB 9.9 (L) 11/23/2023   HCT 29.8 (L) 11/23/2023   MCV 98.0 11/23/2023   PLT 306 11/23/2023      Chemistry      Component Value Date/Time   NA 135 11/02/2023 1351   NA 137 09/27/2023 1224   K 3.9 11/02/2023 1351   CL 104 11/02/2023 1351   CO2 28 11/02/2023 1351   BUN 21 11/02/2023 1351   BUN 26 09/27/2023 1224   CREATININE 1.34 (H) 11/02/2023 1351      Component Value Date/Time   CALCIUM 9.4 11/02/2023 1351   ALKPHOS 136 (H) 11/02/2023 1351   AST 31 11/02/2023 1351   ALT 19 11/02/2023 1351   BILITOT 0.4 11/02/2023 1351       RADIOGRAPHIC STUDIES: CT Head Wo Contrast Result Date: 10/24/2023 CLINICAL DATA:  Dizziness and near syncopal episode EXAM: CT HEAD WITHOUT CONTRAST TECHNIQUE: Contiguous axial images were obtained from the base of the skull through the vertex without intravenous contrast. RADIATION DOSE REDUCTION: This exam was performed according to the departmental dose-optimization program which includes automated exposure control, adjustment of the mA and/or kV according to patient size and/or use of iterative reconstruction technique. COMPARISON:  08/30/2017 FINDINGS: Brain: No evidence of acute infarction, hemorrhage, hydrocephalus, extra-axial collection or mass lesion/mass effect. Vascular: No hyperdense vessel or unexpected calcification. Skull: Normal. Negative for fracture or focal lesion. Sinuses/Orbits: No acute finding. Other: None. IMPRESSION: No acute intracranial abnormality noted. Electronically Signed   By: Violeta Grey M.D.   On: 10/24/2023 21:12   DG Chest Portable 1 View Result Date: 10/24/2023 CLINICAL DATA:  Near syncope. EXAM: PORTABLE CHEST 1 VIEW COMPARISON:  01/17/2023. FINDINGS: Bilateral lung fields are  clear. Bilateral costophrenic angles are clear. Normal cardio-mediastinal silhouette. No acute osseous abnormalities. The soft tissues are within normal limits. Right-sided CT Port-A-Cath is seen with its tip overlying the lower portion of superior vena cava. IMPRESSION: *No active disease. Electronically Signed   By: Beula Brunswick M.D.   On: 10/24/2023 18:56      ASSESSMENT AND PLAN: This is a very pleasant 73 years old African-American female recently diagnosed with a stage IV (T1c, N0, M1b) non-small cell lung cancer, adenocarcinoma with positive KRAS G12C mutation and PD-L1 expression of 1%.  She started systemic chemotherapy with carboplatin  for AUC of 5, Alimta 500 Mg/M2 and Keytruda  200 Mg IV every 3 weeks.  She is status post 11 cycle of her treatment.  The patient has been tolerating this treatment well with no concerning adverse effects. Assessment and Plan    Stage 4 non-small cell lung cancer Stage 4 non-small cell lung cancer, adenocarcinoma subtype, with positive KRAS G12c mutation and PD-L1 expression of 1%. CSystemic chemotherapy with carboplatin  for AUC of 5, Alimta 500 Mg/M2 and Keytruda  200 Mg IV every 3 weeks.  First dose April 05, 2023.Starting cycle #5 she has been on maintenance treatment with Alimta and Keytruda  every 3 weeks.  Status post 11 cycles No new symptoms such as nausea, vomiting, diarrhea, fever, or chills. Mild headache noted yesterday. Blood work acceptable for treatment continuation. - Proceed with cycle 12 of chemotherapy with Alimta and Ketruda. - Order CT scan of the chest, abdomen, and pelvis to assess cancer status one week prior to next visit.   She was advised to call immediately if she has any concerning symptoms in the interval. The patient voices understanding of current disease status and treatment options and is in agreement with the current care plan.  All questions were answered. The patient knows to call the clinic with any problems, questions  or concerns. We can certainly see the patient much sooner if necessary. The total time spent in the appointment was 20 minutes.  Disclaimer: This note was dictated with voice recognition software. Similar sounding words can inadvertently be transcribed and may not be corrected upon review.

## 2023-11-23 NOTE — Patient Instructions (Signed)

## 2023-11-24 LAB — T4: T4, Total: 9.5 ug/dL (ref 4.5–12.0)

## 2023-11-25 ENCOUNTER — Other Ambulatory Visit: Payer: Self-pay

## 2023-12-07 ENCOUNTER — Ambulatory Visit (HOSPITAL_COMMUNITY)

## 2023-12-09 NOTE — Progress Notes (Unsigned)
 [facility] OFFICE PROGRESS NOTE  [name], [name] [address] [place] [address]  DIAGNOSIS: Stage IV (T1c, N0, M1b) Non-Small Cell Lung Cancer, adenocarcinoma. She presented with a spiculated left upper lobe lung nodule and large right adrenal gland mass. She was diagnosed in July 2024. She had molecular studies that showed she has KRASG12C which can be used in the second line setting.    PDL1: 1%   Molecular Studies: STK11 but likely not candidate for 4 drug combination and KRASG12C  PRIOR THERAPY: SBRT to the left upper lobe and right adrenal targets under the care of Dr. Dewey. Last dose of treatment expected on 02/20/2023    CURRENT THERAPY: Systemic chemotherapy with carboplatin  for AUC of 5, Alimta  500 Mg/M2 and Keytruda  200 Mg IV every 3 weeks.  First dose April 05, 2023. Status post 12 cycles. Starting from cycle #5, she started maintenance Alimta  and Keytruda .  2) IV iron  with Venofer  300 mg weekly PRN. Last dose on 03/21/23.  INTERVAL HISTORY: Erin Pacheco 73 y.o. female returns to the clinic today for a follow up visit accompanied by her daughter. The patient is currently undergoing maintenance chemotherapy and immunotherapy.    She states her energy is good. She denies any fever, chills, or night sweats.  She reports that her appetite is pretty good.  She reports her breathing is doing okay. Denies any significant shortness of breath or significant cough. Denies any chest pain or hemoptysis.  She denies any nausea or vomiting.  She sometimes struggles with constipation intermittently but has laxatives with MiraLAX  if needed. Denies any headache or visual changes.  Denies any rashes or skin changes.  She is compliant with her iron  supplement.  The patient has a history of iron  deficiency anemia secondary to GI bleeding.  She is on Eliquis  and compliant.  She previously received IV iron . She has a restaging CT scan scheduled for  12/12/23. She is here today for evaluation and repeat blood work before undergoing cycle #13.     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  MEDICATIONS:  Current Outpatient Medications  Medication Sig Dispense Refill   acetaminophen  (TYLENOL ) 325 MG tablet Take 2 tablets (650 mg total) by mouth every 6 (six) hours as needed for mild pain (or Fever >/= 101).     apixaban  (ELIQUIS ) 5 MG TABS tablet Take 1 tablet (5 mg total) by mouth 2 (two) times daily. 60 tablet 3   apixaban  (ELIQUIS ) 5 MG TABS tablet Take 1 tablet (5 mg total) by mouth 2 (two) times daily. 42 tablet 0   apixaban  (ELIQUIS ) 5 MG TABS tablet Take 1 tablet (5 mg total) by mouth 2 (two) times daily. 28 tablet 0   cephALEXin  (KEFLEX ) 500 MG capsule Take 1 capsule (500 mg total) by mouth 4 (four) times daily. 20 capsule 0   Fluticasone -Umeclidin-Vilant (TRELEGY ELLIPTA ) 100-62.5-25 MCG/ACT AEPB Inhale 1 each into the lungs daily.     folic acid  (FOLVITE ) 1 MG tablet Take 1 tablet (1 mg total) by mouth daily. Start 7 days before pemetrexed  chemotherapy. Continue until 21 days after pemetrexed  completed. 100 tablet 3  lidocaine -prilocaine  (EMLA ) cream APPLY TO THE PORT-A-CATH SITE 30 MINUTES BEFORE TREATMENT. 30 g 0   meclizine  (ANTIVERT ) 25 MG tablet Take 1 tablet (25 mg total) by mouth 3 (three) times daily as needed for dizziness. 14 tablet 0   metoprolol  succinate (TOPROL -XL) 50 MG 24 hr tablet TAKE 1 TABLET BY MOUTH EVERY DAY WITH OR IMMEDIATELY FOLLOWING A MEAL 90 tablet 3   Multiple Vitamin (MULTIVITAMIN) tablet Take 1 tablet by mouth daily.       ondansetron  (ZOFRAN ) 8 MG tablet Take 1 tablet (8 mg total) by mouth every 8 (eight) hours as needed  for nausea or vomiting. 30 tablet 0   oxyCODONE  (OXY IR/ROXICODONE ) 5 MG immediate release tablet Take 1 tablet (5 mg total) by mouth every 6 (six) hours as needed for moderate pain. 60 tablet 0   pantoprazole  (PROTONIX ) 40 MG tablet Take 1 tablet (40 mg total) by mouth daily before breakfast. 30 tablet 1   Polyethyl Glycol-Propyl Glycol (SYSTANE OP) Place 1 drop into both eyes daily as needed (dry eye).     prochlorperazine  (COMPAZINE ) 10 MG tablet Take 1 tablet (10 mg total) by mouth every 6 (six) hours as needed for nausea or vomiting. 30 tablet 1   sacubitril-valsartan (ENTRESTO ) 49-51 MG TAKE 1 TABLET BY MOUTH TWICE A DAY 60 tablet 11   spironolactone  (ALDACTONE ) 25 MG tablet Take 0.5 tablets (12.5 mg total) by mouth daily. Take half a tablet once daily. 45 tablet 3   No current facility-administered medications for this visit.   Facility-Administered Medications Ordered in Other Visits  Medication Dose Route Frequency Provider Last Rate Last Admin   0.9 %  sodium chloride  infusion   Intravenous Once Sherrod Sherrod, MD       heparin  lock flush 100 unit/mL  500 Units Intracatheter Once Sherrod Sherrod, MD       pembrolizumab  (KEYTRUDA ) 200 mg in sodium chloride  0.9 % 50 mL chemo infusion  200 mg Intravenous Once Sherrod Sherrod, MD       sodium chloride  flush (NS) 0.9 % injection 10 mL  10 mL Intracatheter Once Sherrod Sherrod, MD        SURGICAL HISTORY:  Past Surgical History:  Procedure Laterality Date   ABDOMINAL HYSTERECTOMY     BILATERAL OOPHORECTOMY     BIOPSY  12/09/2022   Procedure: BIOPSY;  Surgeon: Federico Rosario BROCKS, MD;  Location: [facility] ENDOSCOPY;  Service: Gastroenterology;;   BRONCHIAL BIOPSY  01/09/2023   Procedure: BRONCHIAL BIOPSIES;  Surgeon: Brenna Adine CROME, DO;  Location: [facility] ENDOSCOPY;  Service: Pulmonary;;   BRONCHIAL NEEDLE ASPIRATION BIOPSY  01/09/2023   Procedure: BRONCHIAL NEEDLE ASPIRATION BIOPSIES;  Surgeon: Brenna Adine CROME, DO;  Location: [facility] ENDOSCOPY;  Service:  Pulmonary;;   CATARACT EXTRACTION W/PHACO  12/27/2010   Procedure: CATARACT EXTRACTION PHACO AND INTRAOCULAR LENS PLACEMENT (IOC);  Surgeon: Cherene Mania;  Location: [facility] ORS;  Service: Ophthalmology;  Laterality: Right;   CATARACT EXTRACTION W/PHACO  03/28/2011   Procedure: CATARACT EXTRACTION PHACO AND INTRAOCULAR LENS PLACEMENT (IOC);  Surgeon: Cherene Mania;  Location: [facility] ORS;  Service: Ophthalmology;  Laterality: Left;  CDE 7.27   ENTEROSCOPY N/A 02/28/2023   Procedure: ENTEROSCOPY;  Surgeon: Avram Lupita BRAVO, MD;  Location: [facility] ENDOSCOPY;  Service: Gastroenterology;  Laterality: N/A;   ESOPHAGOGASTRODUODENOSCOPY (EGD) WITH PROPOFOL  N/A 12/09/2022   Procedure: ESOPHAGOGASTRODUODENOSCOPY (EGD) WITH PROPOFOL ;  Surgeon: Federico Rosario BROCKS, MD;  Location[facility] ENDOSCOPY;  Service: Gastroenterology;  Laterality: N/A;   HOT HEMOSTASIS N/A 02/28/2023   Procedure:  HOT HEMOSTASIS (ARGON PLASMA COAGULATION/BICAP);  Surgeon: Avram Lupita BRAVO, MD;  Location: [facility] ENDOSCOPY;  Service: Gastroenterology;  Laterality: N/A;   IR IMAGING GUIDED PORT INSERTION  03/23/2023   SUBMUCOSAL TATTOO INJECTION  02/28/2023   Procedure: SUBMUCOSAL TATTOO INJECTION;  Surgeon: Avram Lupita BRAVO, MD;  Location: [facility] ENDOSCOPY;  Service: Gastroenterology;;    REVIEW OF SYSTEMS:   Constitutional: Negative for appetite change, chills, fatigue, fever and unexpected weight change.  HENT:  Negative for mouth sores, nosebleeds, sore throat and trouble swallowing.   Eyes: Negative for eye problems and icterus.  Respiratory: Negative for significant cough, hemoptysis, shortness of breath and wheezing.   Cardiovascular: Negative for chest pain. Mild ankle swelling bilaterally.  Gastrointestinal: Positive for mild occasional constipation. Negative for abdominal pain, diarrhea, nausea and vomiting.  Genitourinary: Negative for bladder incontinence, difficulty urinating, dysuria, frequency and hematuria.   Musculoskeletal: Negative for back pain, gait problem,  neck pain and neck stiffness.  Skin: Negative for itching and rash.  Neurological: Negative for dizziness, extremity weakness, gait problem, headaches, light-headedness and seizures.  Hematological: Negative for adenopathy. Does not bruise/bleed easily.  Psychiatric/Behavioral: Negative for confusion, depression and sleep disturbance. The patient is not nervous/anxious.   PHYSICAL EXAMINATION:  Blood pressure 112/65, pulse 64, temperature 98.3 F (36.8 C), temperature source Temporal, resp. rate 16, height 5' 3 (1.6 m), weight 136 lb (61.7 kg), SpO2 99%.  ECOG PERFORMANCE STATUS: 1  Physical Exam  Constitutional: Oriented to person, place, and time and thin appearing female and, and in no distress. HENT:  Head: Normocephalic and atraumatic.  Mouth/Throat: Oropharynx is clear and moist. No oropharyngeal exudate.  Eyes: Conjunctivae are normal. Right eye exhibits no discharge. Left eye exhibits no discharge. No scleral icterus.  Neck: Normal range of motion. Neck supple.  Cardiovascular: Normal rate, regular rhythm, normal heart sounds and intact distal pulses.   Pulmonary/Chest: Effort normal and breath sounds normal. No respiratory distress. No wheezes. No rales.  Abdominal: Soft. Bowel sounds are normal. Exhibits no distension and no mass. There is no tenderness.  Musculoskeletal: Normal range of motion. Exhibits no edema.  Lymphadenopathy:    No cervical adenopathy.  Neurological: Alert and oriented to person, place, and time. Exhibits muscle wasting. Skin: Skin is warm and dry. No rash noted. Not diaphoretic. No erythema. No pallor.  Psychiatric: Mood, memory and judgment normal.  Vitals reviewed.  LABORATORY DATA: Lab Results  Component Value Date   WBC 5.4 12/14/2023   HGB 10.9 (L) 12/14/2023   HCT 33.1 (L) 12/14/2023   MCV 96.2 12/14/2023   PLT 246 12/14/2023      Chemistry      Component Value Date/Time   NA 138 12/14/2023 1438   NA 137 09/27/2023 1224   K 4.3  12/14/2023 1438   CL 104 12/14/2023 1438   CO2 29 12/14/2023 1438   BUN 29 (H) 12/14/2023 1438   BUN 26 09/27/2023 1224   CREATININE 1.44 (H) 12/14/2023 1438      Component Value Date/Time   CALCIUM 9.4 12/14/2023 1438   ALKPHOS 121 12/14/2023 1438   AST 31 12/14/2023 1438   ALT 21 12/14/2023 1438   BILITOT 0.4 12/14/2023 1438       RADIOGRAPHIC STUDIES:  CT CHEST ABDOMEN PELVIS W CONTRAST Result Date: 12/14/2023 EXAM:  CT CHEST ABDOMEN PELVIS WITH IV CONTRAST INDICATION:  Non-small cell lung cancer (NSCLC), staging TECHNIQUE: Spiral CT scanning was performed through the chest, abdomen and pelvis after the patient received intravenous Omnipaque  300,  75 mL. COMPARISON: 10/03/2023 FINDINGS: The cardiac size is within normal limits. There is no thoracic aortic aneurysm. No filling defects are identified in the central pulmonary arteries. The esophagus and thyroid  glands have a normal appearance. There is no mass or adenopathy in the chest. No pleural or pericardial effusion is present. The right-sided chest wall port is unchanged. There is redemonstration of a spiculated left upper lobe mass, currently measuring 1.4 x 1.6 cm. This has not changed significantly. The surrounding interstitial opacities also appear stable. The upper lobe predominant emphysematous changes are stable. Focal scarring is present in the inferior lingula on images 74-78 of series 4, unchanged. There is stable bronchiectasis in the posteromedial left lower lobe. No enlarging or developing lung masses are identified. There is no significant abnormality identified in the liver, spleen, pancreas and gallbladder. No calculus, obstruction, or soft tissue mass is present involving the kidneys. A right renal cyst is present which does not require imaging follow-up. The low-attenuation right adrenal mass measures 5.8 x 3.1 cm, which has not significantly changed. The abdominal bowel loops are unremarkable except for retained stool.  There is no evidence of ascites or adenopathy. No abdominal aortic aneurysm is present. The uterus is surgically absent. The bladder has a normal appearance. There is no adnexal mass. Small inguinal hernias are present. There is rectosigmoid stool retention. There is no fracture or bone destruction. 1. No significant change in spiculated left upper lobe mass with surrounding interstitial opacities, most likely representing posttreatment change. 2. No evidence of metastatic disease in the chest. 3. No significant change in low-attenuation right adrenal mass. 4. Additional stable nonacute findings as described above. Please note that CT scanning at this site utilizes multiple dose reduction techniques, including automatic exposure control, adjustment of the MAA and/or KVP according to the patient's size, and use of iterative reconstruction. Electronically signed by: Eddy Oar MD 12/14/2023 10:23 AM EDT RP Workstation: [id number]     ASSESSMENT/PLAN:  This is a very pleasant 73 year old female with stage IV (T1c, N0, M1) Non-Small Cell Lung Cancer, adenocarcinoma. She presented with a spiculated left upper lobe lung nodule and large right adrenal gland mass. She was diagnosed in July 2024. She had molecular studies that showed she has KRASG12C which can be used in the second line setting.  PDL1 1%, she has STK 11.  Dr. Sherrod does not feel that the patient is not a good candidate for the 4 drug combination and her performance status.   She underwent radiation to the adrenal lesion and the lung under the care of Dr. Dewey. The last dose was on 02/20/23   She is currently on palliative chemotherapy with carboplatin  for an AUC of 5, Alimta  500 mg/m2 and immunotherapy with Keytruda  200 mg IV every 3 weeks. First dose on 04/05/23 which she tolerated well except for neutropenia for which she receives zarxio  as needed. She is status post 12 cycles.  Starting from cycle #5, she started maintenance Alimta  and  Keytruda .   She recently had a restaging CT scan performed. The scan did not show any disease progression. Her disease is stable.    Labs were reviewed. We will hold her alimta  today due to her GFR of 39. Recommend that she proceed with cycle #13 today as scheduled with single agent keytruda .    She will continue her iron  supplement and blood thinner.    Of note, she is allergic to chloraprep.    Will see her back for labs and  follow-up visit in 3 weeks for evaluation repeat blood work before undergoing cycle #14   She was advised to hydrate well at home.   The patient was advised to call immediately if she has any concerning symptoms in the interval. The patient voices understanding of current disease status and treatment options and is in agreement with the current care plan. All questions were answered. The patient knows to call the clinic with any problems, questions or concerns. We can certainly see the patient much sooner if necessary     No orders of the defined types were placed in this encounter.    . The total time spent in the appointment was 20-29 minutes  Ellen Goris L Marites Nath, [name] 12/14/23

## 2023-12-12 ENCOUNTER — Ambulatory Visit (HOSPITAL_COMMUNITY)
Admission: RE | Admit: 2023-12-12 | Discharge: 2023-12-12 | Disposition: A | Discharge status: Home / Self Care | Source: Ambulatory Visit | Attending: Internal Medicine | Admitting: Internal Medicine

## 2023-12-12 ENCOUNTER — Other Ambulatory Visit: Payer: Self-pay | Admitting: Internal Medicine

## 2023-12-12 DIAGNOSIS — C3490 Malignant neoplasm of unspecified part of unspecified bronchus or lung: Secondary | ICD-10-CM | POA: Diagnosis not present

## 2023-12-12 DIAGNOSIS — K5900 Constipation, unspecified: Secondary | ICD-10-CM | POA: Diagnosis not present

## 2023-12-12 DIAGNOSIS — J439 Emphysema, unspecified: Secondary | ICD-10-CM | POA: Diagnosis not present

## 2023-12-12 DIAGNOSIS — Z90710 Acquired absence of both cervix and uterus: Secondary | ICD-10-CM | POA: Diagnosis not present

## 2023-12-12 MED ORDER — SODIUM CHLORIDE (PF) 0.9 % IJ SOLN
INTRAMUSCULAR | Status: AC
  Filled 2023-12-12: qty 50

## 2023-12-12 MED ORDER — IOHEXOL 300 MG/ML  SOLN
75.0000 mL | Freq: Once | INTRAMUSCULAR | Status: AC | PRN
  Administered 2023-12-12: 75 mL via INTRAVENOUS

## 2023-12-14 ENCOUNTER — Inpatient Hospital Stay

## 2023-12-14 ENCOUNTER — Ambulatory Visit: Admitting: Physician Assistant

## 2023-12-14 ENCOUNTER — Inpatient Hospital Stay: Attending: Physician Assistant

## 2023-12-14 ENCOUNTER — Ambulatory Visit

## 2023-12-14 ENCOUNTER — Other Ambulatory Visit

## 2023-12-14 ENCOUNTER — Inpatient Hospital Stay: Admitting: Physician Assistant

## 2023-12-14 VITALS — BP 112/65 | HR 64 | Temp 98.3°F | Resp 16 | Ht 63.0 in | Wt 136.0 lb

## 2023-12-14 DIAGNOSIS — D701 Agranulocytosis secondary to cancer chemotherapy: Secondary | ICD-10-CM | POA: Insufficient documentation

## 2023-12-14 DIAGNOSIS — Z95828 Presence of other vascular implants and grafts: Secondary | ICD-10-CM

## 2023-12-14 DIAGNOSIS — C3492 Malignant neoplasm of unspecified part of left bronchus or lung: Secondary | ICD-10-CM | POA: Diagnosis not present

## 2023-12-14 DIAGNOSIS — Z5112 Encounter for antineoplastic immunotherapy: Secondary | ICD-10-CM | POA: Insufficient documentation

## 2023-12-14 DIAGNOSIS — Z79620 Long term (current) use of immunosuppressive biologic: Secondary | ICD-10-CM | POA: Insufficient documentation

## 2023-12-14 DIAGNOSIS — Z5111 Encounter for antineoplastic chemotherapy: Secondary | ICD-10-CM | POA: Diagnosis present

## 2023-12-14 DIAGNOSIS — C7971 Secondary malignant neoplasm of right adrenal gland: Secondary | ICD-10-CM | POA: Insufficient documentation

## 2023-12-14 DIAGNOSIS — C3412 Malignant neoplasm of upper lobe, left bronchus or lung: Secondary | ICD-10-CM | POA: Diagnosis not present

## 2023-12-14 LAB — CMP (CANCER CENTER ONLY)
ALT: 21 U/L (ref 0–44)
AST: 31 U/L (ref 15–41)
Albumin: 3.1 g/dL — ABNORMAL LOW (ref 3.5–5.0)
Alkaline Phosphatase: 121 U/L (ref 38–126)
Anion gap: 5 (ref 5–15)
BUN: 29 mg/dL — ABNORMAL HIGH (ref 8–23)
CO2: 29 mmol/L (ref 22–32)
Calcium: 9.4 mg/dL (ref 8.9–10.3)
Chloride: 104 mmol/L (ref 98–111)
Creatinine: 1.44 mg/dL — ABNORMAL HIGH (ref 0.44–1.00)
GFR, Estimated: 39 mL/min — ABNORMAL LOW (ref >60)
Glucose, Bld: 137 mg/dL — ABNORMAL HIGH (ref 70–99)
Potassium: 4.3 mmol/L (ref 3.5–5.1)
Sodium: 138 mmol/L (ref 135–145)
Total Bilirubin: 0.4 mg/dL (ref 0.0–1.2)
Total Protein: 7.8 g/dL (ref 6.5–8.1)

## 2023-12-14 LAB — CBC WITH DIFFERENTIAL (CANCER CENTER ONLY)
Abs Immature Granulocytes: 0.03 K/uL (ref 0.00–0.07)
Basophils Absolute: 0 K/uL (ref 0.0–0.1)
Basophils Relative: 1 %
Eosinophils Absolute: 0.1 K/uL (ref 0.0–0.5)
Eosinophils Relative: 2 %
HCT: 33.1 % — ABNORMAL LOW (ref 36.0–46.0)
Hemoglobin: 10.9 g/dL — ABNORMAL LOW (ref 12.0–15.0)
Immature Granulocytes: 1 %
Lymphocytes Relative: 33 %
Lymphs Abs: 1.8 K/uL (ref 0.7–4.0)
MCH: 31.7 pg (ref 26.0–34.0)
MCHC: 32.9 g/dL (ref 30.0–36.0)
MCV: 96.2 fL (ref 80.0–100.0)
Monocytes Absolute: 0.7 K/uL (ref 0.1–1.0)
Monocytes Relative: 12 %
Neutro Abs: 2.8 K/uL (ref 1.7–7.7)
Neutrophils Relative %: 51 %
Platelet Count: 246 K/uL (ref 150–400)
RBC: 3.44 MIL/uL — ABNORMAL LOW (ref 3.87–5.11)
RDW: 15.2 % (ref 11.5–15.5)
WBC Count: 5.4 K/uL (ref 4.0–10.5)
nRBC: 0 % (ref 0.0–0.2)

## 2023-12-14 MED ORDER — SODIUM CHLORIDE 0.9% FLUSH
10.0000 mL | Freq: Once | INTRAVENOUS | Status: AC
  Administered 2023-12-14: 10 mL

## 2023-12-14 MED ORDER — SODIUM CHLORIDE 0.9 % IV SOLN: Freq: Once | INTRAVENOUS | Status: AC

## 2023-12-14 MED ORDER — HEPARIN SOD (PORK) LOCK FLUSH 100 UNIT/ML IV SOLN
500.0000 [IU] | Freq: Once | INTRAVENOUS | Status: AC | PRN
Start: 2023-12-14 — End: 2023-12-14
  Administered 2023-12-14: 500 [IU]

## 2023-12-14 MED ORDER — SODIUM CHLORIDE 0.9 % IV SOLN
200.0000 mg | Freq: Once | INTRAVENOUS | Status: AC
  Administered 2023-12-14: 200 mg via INTRAVENOUS
  Filled 2023-12-14: qty 200

## 2023-12-14 MED ORDER — SODIUM CHLORIDE 0.9% FLUSH
10.0000 mL | INTRAVENOUS | Status: DC | PRN
  Administered 2023-12-14: 10 mL

## 2023-12-14 NOTE — Patient Instructions (Signed)

## 2023-12-15 ENCOUNTER — Other Ambulatory Visit: Payer: Self-pay

## 2023-12-18 ENCOUNTER — Other Ambulatory Visit (HOSPITAL_COMMUNITY): Payer: Self-pay | Admitting: Physician Assistant

## 2023-12-18 DIAGNOSIS — Z1231 Encounter for screening mammogram for malignant neoplasm of breast: Secondary | ICD-10-CM

## 2023-12-28 NOTE — Progress Notes (Signed)
 Erin Pacheco  [name], [name] [address] [place] [address]  DIAGNOSIS:  Stage IV (T1c, N0, M1b) Non-Small Cell Lung Cancer, adenocarcinoma. She presented with a spiculated left upper lobe lung nodule and large right adrenal gland mass. She was diagnosed in July 2024. She had molecular studies that showed she has KRASG12C which can be used in the second line setting.    PDL1: 1%   Molecular Studies: STK11 but likely not candidate for 4 drug combination and KRASG12C  PRIOR THERAPY:  SBRT to the left upper lobe and right adrenal targets under the care of Dr. Dewey. Last dose of treatment expected on 02/20/2023   CURRENT THERAPY:  Systemic chemotherapy with carboplatin  for AUC of 5, Alimta  500 Mg/M2 and Keytruda  200 Mg IV every 3 weeks.  First dose April 05, 2023. Status post 13 cycles. Starting from cycle #5, she started maintenance Alimta  and Keytruda .  2) IV iron  with Venofer  300 mg weekly PRN. Last dose on 03/21/23.  INTERVAL HISTORY: Erin Pacheco 73 y.o. female returns to the clinic today for a follow up visit accompanied by her daughter. The patient is currently undergoing maintenance chemotherapy and immunotherapy.    She states her energy is good. She denies any fever, chills, or night sweats.  She reports that her appetite is good.  She reports her breathing is doing good. Denies any significant shortness of breath or significant cough. Denies any chest pain or hemoptysis.  She threw up mucus a few days ago secondary to drainage. She uses what sounds like is flonase. She sometimes struggles with constipation intermittently but has laxatives with MiraLAX  if needed. Denies any headache or visual changes.  Denies any rashes or skin changes. She is compliant with her iron  supplement.  The patient has a history of iron  deficiency anemia secondary to GI bleeding.  She is on Eliquis  and compliant.  She previously received IV  iron . She is here today for evaluation and repeat blood work before undergoing cycle #14.     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  MEDICATIONS:  Current Outpatient Medications  Medication Sig Dispense Refill   acetaminophen  (TYLENOL ) 325 MG tablet Take 2 tablets (650 mg total) by mouth every 6 (six) hours as needed for mild pain (or Fever >/= 101).     apixaban  (ELIQUIS ) 5 MG TABS tablet Take 1 tablet (5 mg total) by mouth 2 (two) times daily. 60 tablet 3   apixaban  (ELIQUIS ) 5 MG TABS tablet Take 1 tablet (5 mg total) by mouth 2 (two) times daily. 42 tablet 0   apixaban  (ELIQUIS ) 5 MG TABS tablet Take 1 tablet (5 mg total) by mouth 2 (two) times daily. 28 tablet 0   Fluticasone -Umeclidin-Vilant (TRELEGY ELLIPTA ) 100-62.5-25 MCG/ACT AEPB Inhale 1 each into the lungs daily.     folic acid  (FOLVITE ) 1 MG tablet Take 1 tablet (1 mg total) by mouth daily. Start 7 days before pemetrexed  chemotherapy. Continue until 21 days after pemetrexed  completed. 100 tablet 3   lidocaine -prilocaine  (EMLA ) cream APPLY TO THE PORT-A-CATH SITE 30 MINUTES BEFORE TREATMENT. 30 g 0   meclizine  (ANTIVERT ) 25  MG tablet Take 1 tablet (25 mg total) by mouth 3 (three) times daily as needed for dizziness. 14 tablet 0   metoprolol  succinate (TOPROL -XL) 50 MG 24 hr tablet TAKE 1 TABLET BY MOUTH EVERY DAY WITH OR IMMEDIATELY FOLLOWING A MEAL 90 tablet 3   Multiple Vitamin (MULTIVITAMIN) tablet Take 1 tablet by mouth daily.       ondansetron  (ZOFRAN ) 8 MG tablet Take 1 tablet (8 mg total) by mouth every 8 (eight) hours as needed for nausea or vomiting. 30 tablet 0   oxyCODONE  (OXY IR/ROXICODONE ) 5 MG immediate release tablet Take 1 tablet (5  mg total) by mouth every 6 (six) hours as needed for moderate pain. 60 tablet 0   pantoprazole  (PROTONIX ) 40 MG tablet Take 1 tablet (40 mg total) by mouth daily before breakfast. 30 tablet 1   Polyethyl Glycol-Propyl Glycol (SYSTANE OP) Place 1 drop into both eyes daily as needed (dry eye).     prochlorperazine  (COMPAZINE ) 10 MG tablet Take 1 tablet (10 mg total) by mouth every 6 (six) hours as needed for nausea or vomiting. 30 tablet 1   sacubitril-valsartan (ENTRESTO ) 49-51 MG TAKE 1 TABLET BY MOUTH TWICE A DAY 60 tablet 11   spironolactone  (ALDACTONE ) 25 MG tablet Take 0.5 tablets (12.5 mg total) by mouth daily. Take half a tablet once daily. 45 tablet 3   No current facility-administered medications for this visit.   Facility-Administered Medications Ordered in Other Visits  Medication Dose Route Frequency Provider Last Rate Last Admin   heparin  lock flush 100 unit/mL  500 Units Intracatheter Once Sherrod Sherrod, MD       sodium chloride  flush (NS) 0.9 % injection 10 mL  10 mL Intracatheter Once Sherrod Sherrod, MD        SURGICAL HISTORY:  Past Surgical History:  Procedure Laterality Date   ABDOMINAL HYSTERECTOMY     BILATERAL OOPHORECTOMY     BIOPSY  12/09/2022   Procedure: BIOPSY;  Surgeon: Federico Rosario BROCKS, MD;  Location: [facility] ENDOSCOPY;  Service: Gastroenterology;;   BRONCHIAL BIOPSY  01/09/2023   Procedure: BRONCHIAL BIOPSIES;  Surgeon: Brenna Adine CROME, DO;  Location: [facility] ENDOSCOPY;  Service: Pulmonary;;   BRONCHIAL NEEDLE ASPIRATION BIOPSY  01/09/2023   Procedure: BRONCHIAL NEEDLE ASPIRATION BIOPSIES;  Surgeon: Brenna Adine CROME, DO;  Location: [facility] ENDOSCOPY;  Service: Pulmonary;;   CATARACT EXTRACTION W/PHACO  12/27/2010   Procedure: CATARACT EXTRACTION PHACO AND INTRAOCULAR LENS PLACEMENT (IOC);  Surgeon: Cherene Mania;  Location: [facility] ORS;  Service: Ophthalmology;  Laterality: Right;   CATARACT EXTRACTION W/PHACO  03/28/2011   Procedure: CATARACT EXTRACTION PHACO AND INTRAOCULAR LENS  PLACEMENT (IOC);  Surgeon: Cherene Mania;  Location: [facility] ORS;  Service: Ophthalmology;  Laterality: Left;  CDE 7.27   ENTEROSCOPY N/A 02/28/2023   Procedure: ENTEROSCOPY;  Surgeon: Avram Lupita BRAVO, MD;  Location: [facility] ENDOSCOPY;  Service: Gastroenterology;  Laterality: N/A;   ESOPHAGOGASTRODUODENOSCOPY (EGD) WITH PROPOFOL  N/A 12/09/2022   Procedure: ESOPHAGOGASTRODUODENOSCOPY (EGD) WITH PROPOFOL ;  Surgeon: Federico Rosario BROCKS, MD;  Location: [facility] ENDOSCOPY;  Service: Gastroenterology;  Laterality: N/A;   HOT HEMOSTASIS N/A 02/28/2023   Procedure: HOT HEMOSTASIS (ARGON PLASMA COAGULATION/BICAP);  Surgeon: Avram Lupita BRAVO, MD;  Location: [facility] ENDOSCOPY;  Service: Gastroenterology;  Laterality: N/A;   IR IMAGING GUIDED PORT INSERTION  03/23/2023   SUBMUCOSAL TATTOO INJECTION  02/28/2023   Procedure: SUBMUCOSAL TATTOO INJECTION;  Surgeon: Avram Lupita BRAVO, MD;  Location: [facility] ENDOSCOPY;  Service: Gastroenterology;;    REVIEW OF SYSTEMS:   Constitutional: Negative  for appetite change, chills, fatigue, fever and unexpected weight change.  HENT:  Negative for mouth sores, nosebleeds, sore throat and trouble swallowing.   Eyes: Negative for eye problems and icterus.  Respiratory: Negative for significant cough, hemoptysis, shortness of breath and wheezing.   Cardiovascular: Negative for chest pain. Mild ankle swelling bilaterally.  Gastrointestinal: Positive for mild occasional constipation. Negative for abdominal pain, diarrhea, nausea and vomiting.  Genitourinary: Negative for bladder incontinence, difficulty urinating, dysuria, frequency and hematuria.   Musculoskeletal: Negative for back pain, gait problem, neck pain and neck stiffness.  Skin: Negative for itching and rash.  Neurological: Negative for dizziness, extremity weakness, gait problem, headaches, light-headedness and seizures.  Hematological: Negative for adenopathy. Does not bruise/bleed easily.  Psychiatric/Behavioral: Negative for confusion, depression and  sleep disturbance. The patient is not nervous/anxious.     PHYSICAL EXAMINATION:  Blood pressure 106/63, pulse 84, temperature 97.9 F (36.6 C), temperature source Tympanic, resp. rate 18, height 5' 3 (1.6 m), weight 137 lb (62.1 kg), SpO2 99%.  ECOG PERFORMANCE STATUS: 1  Physical Exam  Constitutional: Oriented to person, place, and time and thin appearing female and, and in no distress. HENT:  Head: Normocephalic and atraumatic.  Mouth/Throat: Oropharynx is clear and moist. No oropharyngeal exudate.  Eyes: Conjunctivae are normal. Right eye exhibits no discharge. Left eye exhibits no discharge. No scleral icterus.  Neck: Normal range of motion. Neck supple.  Cardiovascular: Normal rate, regular rhythm, normal heart sounds and intact distal pulses.   Pulmonary/Chest: Effort normal and breath sounds normal. No respiratory distress. No wheezes. No rales.  Abdominal: Soft. Bowel sounds are normal. Exhibits no distension and no mass. There is no tenderness.  Musculoskeletal: Normal range of motion. Exhibits no edema.  Lymphadenopathy:    No cervical adenopathy.  Neurological: Alert and oriented to person, place, and time. Exhibits muscle wasting. Skin: Skin is warm and dry. No rash noted. Not diaphoretic. No erythema. No pallor.  Psychiatric: Mood, memory and judgment normal.  Vitals reviewed.  LABORATORY DATA: Lab Results  Component Value Date   WBC 4.4 01/03/2024   HGB 11.7 (L) 01/03/2024   HCT 34.8 (L) 01/03/2024   MCV 94.3 01/03/2024   PLT 213 01/03/2024      Chemistry      Component Value Date/Time   NA 137 01/03/2024 1342   NA 137 09/27/2023 1224   K 4.4 01/03/2024 1342   CL 105 01/03/2024 1342   CO2 28 01/03/2024 1342   BUN 28 (H) 01/03/2024 1342   BUN 26 09/27/2023 1224   CREATININE 1.37 (H) 01/03/2024 1342      Component Value Date/Time   CALCIUM 9.4 01/03/2024 1342   ALKPHOS 131 (H) 01/03/2024 1342   AST 32 01/03/2024 1342   ALT 22 01/03/2024 1342    BILITOT 0.5 01/03/2024 1342       RADIOGRAPHIC STUDIES:  CT CHEST ABDOMEN PELVIS W CONTRAST Result Date: 12/14/2023 EXAM:  CT CHEST ABDOMEN PELVIS WITH IV CONTRAST INDICATION:  Non-small cell lung cancer (NSCLC), staging TECHNIQUE: Spiral CT scanning was performed through the chest, abdomen and pelvis after the patient received intravenous Omnipaque  300, 75 mL. COMPARISON: 10/03/2023 FINDINGS: The cardiac size is within normal limits. There is no thoracic aortic aneurysm. No filling defects are identified in the central pulmonary arteries. The esophagus and thyroid  glands have a normal appearance. There is no mass or adenopathy in the chest. No pleural or pericardial effusion is present. The right-sided chest wall port is unchanged. There is redemonstration  of a spiculated left upper lobe mass, currently measuring 1.4 x 1.6 cm. This has not changed significantly. The surrounding interstitial opacities also appear stable. The upper lobe predominant emphysematous changes are stable. Focal scarring is present in the inferior lingula on images 74-78 of series 4, unchanged. There is stable bronchiectasis in the posteromedial left lower lobe. No enlarging or developing lung masses are identified. There is no significant abnormality identified in the liver, spleen, pancreas and gallbladder. No calculus, obstruction, or soft tissue mass is present involving the kidneys. A right renal cyst is present which does not require imaging follow-up. The low-attenuation right adrenal mass measures 5.8 x 3.1 cm, which has not significantly changed. The abdominal bowel loops are unremarkable except for retained stool. There is no evidence of ascites or adenopathy. No abdominal aortic aneurysm is present. The uterus is surgically absent. The bladder has a normal appearance. There is no adnexal mass. Small inguinal hernias are present. There is rectosigmoid stool retention. There is no fracture or bone destruction. 1. No  significant change in spiculated left upper lobe mass with surrounding interstitial opacities, most likely representing posttreatment change. 2. No evidence of metastatic disease in the chest. 3. No significant change in low-attenuation right adrenal mass. 4. Additional stable nonacute findings as described above. Please Pacheco that CT scanning at this site utilizes multiple dose reduction techniques, including automatic exposure control, adjustment of the MAA and/or KVP according to the patient's size, and use of iterative reconstruction. Electronically signed by: Eddy Oar MD 12/14/2023 10:23 AM EDT RP Workstation: [id number]     ASSESSMENT/PLAN:  This is a very pleasant 73 year old female with stage IV (T1c, N0, M1) Non-Small Cell Lung Cancer, adenocarcinoma. She presented with a spiculated left upper lobe lung nodule and large right adrenal gland mass. She was diagnosed in July 2024. She had molecular studies that showed she has KRASG12C which can be used in the second line setting.  PDL1 1%, she has STK 11.  Dr. Sherrod does not feel that the patient is not a good candidate for the 4 drug combination and her performance status.   She underwent radiation to the adrenal lesion and the lung under the care of Dr. Dewey. The last dose was on 02/20/23   She is currently on palliative chemotherapy with carboplatin  for an AUC of 5, Alimta  500 mg/m2 and immunotherapy with Keytruda  200 mg IV every 3 weeks. First dose on 04/05/23 which she tolerated well except for neutropenia for which she receives zarxio  as needed. She is status post 13 cycles.  Starting from cycle #5, she started maintenance Alimta  and Keytruda .  Labs were reviewed.  I reviewed her labs, specifically her creatinine and GFR with Dr. Sherrod. Her PDL1 expression is only 1%. Dr. Sherrod recommends reducing the dose of Alimta  today and future cycles to 400 mg/m2. She is ok to treat today with GFR of 41. Recommend that she proceed with cycle #14  today as scheduled with both alimta  and keytruda .   She will continue her iron  supplement and blood thinner.   Will see her back for labs and follow-up visit in 3 weeks for evaluation repeat blood work before undergoing cycle #15   She was advised to hydrate well at home.   The patient was advised to call immediately if she has any concerning symptoms in the interval. The patient voices understanding of current disease status and treatment options and is in agreement with the current care plan. All questions were answered.  The patient knows to call the clinic with any problems, questions or concerns. We can certainly see the patient much sooner if necessary     Orders Placed This Encounter  Procedures   CBC with Differential (Cancer Center Only)    Standing Status:   Future    Expected Date:   03/28/2024    Expiration Date:   03/28/2025   CMP (Cancer Center only)    Standing Status:   Future    Expected Date:   03/28/2024    Expiration Date:   03/28/2025   T4    Standing Status:   Future    Expected Date:   03/28/2024    Expiration Date:   03/28/2025   TSH    Standing Status:   Future    Expected Date:   03/28/2024    Expiration Date:   03/28/2025   CBC with Differential (Cancer Center Only)    Standing Status:   Future    Expected Date:   04/18/2024    Expiration Date:   04/18/2025   CMP (Cancer Center only)    Standing Status:   Future    Expected Date:   04/18/2024    Expiration Date:   04/18/2025   CBC with Differential (Cancer Center Only)    Standing Status:   Future    Expected Date:   05/09/2024    Expiration Date:   05/09/2025   CMP (Cancer Center only)    Standing Status:   Future    Expected Date:   05/09/2024    Expiration Date:   05/09/2025   CBC with Differential (Cancer Center Only)    Standing Status:   Future    Expected Date:   05/30/2024    Expiration Date:   05/30/2025   CMP (Cancer Center only)    Standing Status:   Future    Expected Date:    05/30/2024    Expiration Date:   05/30/2025   T4    Standing Status:   Future    Expected Date:   05/30/2024    Expiration Date:   05/30/2025   TSH    Standing Status:   Future    Expected Date:   05/30/2024    Expiration Date:   05/30/2025    The total time spent in the appointment was 20-29 minutes  Cindi Ghazarian L Henryk Ursin, [name] 01/03/24

## 2024-01-03 ENCOUNTER — Inpatient Hospital Stay

## 2024-01-03 ENCOUNTER — Inpatient Hospital Stay (HOSPITAL_BASED_OUTPATIENT_CLINIC_OR_DEPARTMENT_OTHER): Admitting: Physician Assistant

## 2024-01-03 ENCOUNTER — Encounter: Payer: Self-pay | Admitting: Internal Medicine

## 2024-01-03 VITALS — BP 106/63 | HR 84 | Temp 97.9°F | Resp 18 | Ht 63.0 in | Wt 137.0 lb

## 2024-01-03 DIAGNOSIS — Z5111 Encounter for antineoplastic chemotherapy: Secondary | ICD-10-CM

## 2024-01-03 DIAGNOSIS — T451X5A Adverse effect of antineoplastic and immunosuppressive drugs, initial encounter: Secondary | ICD-10-CM

## 2024-01-03 DIAGNOSIS — C3412 Malignant neoplasm of upper lobe, left bronchus or lung: Secondary | ICD-10-CM

## 2024-01-03 DIAGNOSIS — Z5112 Encounter for antineoplastic immunotherapy: Secondary | ICD-10-CM | POA: Diagnosis not present

## 2024-01-03 DIAGNOSIS — Z95828 Presence of other vascular implants and grafts: Secondary | ICD-10-CM

## 2024-01-03 LAB — CMP (CANCER CENTER ONLY)
ALT: 22 U/L (ref 0–44)
AST: 32 U/L (ref 15–41)
Albumin: 3.4 g/dL — ABNORMAL LOW (ref 3.5–5.0)
Alkaline Phosphatase: 131 U/L — ABNORMAL HIGH (ref 38–126)
Anion gap: 4 — ABNORMAL LOW (ref 5–15)
BUN: 28 mg/dL — ABNORMAL HIGH (ref 8–23)
CO2: 28 mmol/L (ref 22–32)
Calcium: 9.4 mg/dL (ref 8.9–10.3)
Chloride: 105 mmol/L (ref 98–111)
Creatinine: 1.37 mg/dL — ABNORMAL HIGH (ref 0.44–1.00)
GFR, Estimated: 41 mL/min — ABNORMAL LOW (ref >60)
Glucose, Bld: 84 mg/dL (ref 70–99)
Potassium: 4.4 mmol/L (ref 3.5–5.1)
Sodium: 137 mmol/L (ref 135–145)
Total Bilirubin: 0.5 mg/dL (ref 0.0–1.2)
Total Protein: 8 g/dL (ref 6.5–8.1)

## 2024-01-03 LAB — CBC WITH DIFFERENTIAL (CANCER CENTER ONLY)
Abs Immature Granulocytes: 0.02 K/uL (ref 0.00–0.07)
Basophils Absolute: 0 K/uL (ref 0.0–0.1)
Basophils Relative: 1 %
Eosinophils Absolute: 0.2 K/uL (ref 0.0–0.5)
Eosinophils Relative: 4 %
HCT: 34.8 % — ABNORMAL LOW (ref 36.0–46.0)
Hemoglobin: 11.7 g/dL — ABNORMAL LOW (ref 12.0–15.0)
Immature Granulocytes: 1 %
Lymphocytes Relative: 42 %
Lymphs Abs: 1.9 K/uL (ref 0.7–4.0)
MCH: 31.7 pg (ref 26.0–34.0)
MCHC: 33.6 g/dL (ref 30.0–36.0)
MCV: 94.3 fL (ref 80.0–100.0)
Monocytes Absolute: 0.6 K/uL (ref 0.1–1.0)
Monocytes Relative: 13 %
Neutro Abs: 1.7 K/uL (ref 1.7–7.7)
Neutrophils Relative %: 39 %
Platelet Count: 213 K/uL (ref 150–400)
RBC: 3.69 MIL/uL — ABNORMAL LOW (ref 3.87–5.11)
RDW: 14.6 % (ref 11.5–15.5)
WBC Count: 4.4 K/uL (ref 4.0–10.5)
nRBC: 0 % (ref 0.0–0.2)

## 2024-01-03 MED ORDER — SODIUM CHLORIDE 0.9 % IV SOLN
400.0000 mg/m2 | Freq: Once | INTRAVENOUS | Status: AC
  Administered 2024-01-03: 600 mg via INTRAVENOUS
  Filled 2024-01-03: qty 20

## 2024-01-03 MED ORDER — SODIUM CHLORIDE 0.9 % IV SOLN: Freq: Once | INTRAVENOUS | Status: AC

## 2024-01-03 MED ORDER — SODIUM CHLORIDE 0.9% FLUSH: 10.0000 mL | INTRAVENOUS | Status: DC | PRN

## 2024-01-03 MED ORDER — PROCHLORPERAZINE MALEATE 10 MG PO TABS
10.0000 mg | ORAL_TABLET | Freq: Once | ORAL | Status: AC
Start: 2024-01-03 — End: 2024-01-03
  Administered 2024-01-03: 10 mg via ORAL
  Filled 2024-01-03: qty 1

## 2024-01-03 MED ORDER — SODIUM CHLORIDE 0.9 % IV SOLN
200.0000 mg | Freq: Once | INTRAVENOUS | Status: AC
  Administered 2024-01-03: 200 mg via INTRAVENOUS
  Filled 2024-01-03: qty 200

## 2024-01-03 MED ORDER — SODIUM CHLORIDE 0.9% FLUSH
10.0000 mL | Freq: Once | INTRAVENOUS | Status: AC
Start: 2024-01-03 — End: 2024-01-03
  Administered 2024-01-03: 10 mL

## 2024-01-03 NOTE — Patient Instructions (Signed)
 [facility] - A DEPT OF [facility]  Discharge Instructions: Thank you for choosing [facility] to provide your oncology and hematology care.   If you have a lab appointment with the Cancer Center, please go directly to the Cancer Center and check in at the registration area.   Wear comfortable clothing and clothing appropriate for easy access to any Portacath or PICC line.   We strive to give you quality time with your provider. You may need to reschedule your appointment if you arrive late (15 or more minutes).  Arriving late affects you and other patients whose appointments are after yours.  Also, if you miss three or more appointments without notifying the office, you may be dismissed from the clinic at the provider's discretion.      For prescription refill requests, have your pharmacy contact our office and allow 72 hours for refills to be completed.    Today you received the following chemotherapy and/or immunotherapy agents: Keytruda, Alimta.       To help prevent nausea and vomiting after your treatment, we encourage you to take your nausea medication as directed.  BELOW ARE SYMPTOMS THAT SHOULD BE REPORTED IMMEDIATELY: *FEVER GREATER THAN 100.4 F (38 C) OR HIGHER *CHILLS OR SWEATING *NAUSEA AND VOMITING THAT IS NOT CONTROLLED WITH YOUR NAUSEA MEDICATION *UNUSUAL SHORTNESS OF BREATH *UNUSUAL BRUISING OR BLEEDING *URINARY PROBLEMS (pain or burning when urinating, or frequent urination) *BOWEL PROBLEMS (unusual diarrhea, constipation, pain near the anus) TENDERNESS IN MOUTH AND THROAT WITH OR WITHOUT PRESENCE OF ULCERS (sore throat, sores in mouth, or a toothache) UNUSUAL RASH, SWELLING OR PAIN  UNUSUAL VAGINAL DISCHARGE OR ITCHING   Items with * indicate a potential emergency and should be followed up as soon as possible or go to the Emergency Department if any problems should occur.  Please show the CHEMOTHERAPY ALERT CARD or  IMMUNOTHERAPY ALERT CARD at check-in to the Emergency Department and triage nurse.  Should you have questions after your visit or need to cancel or reschedule your appointment, please contact [facility] - A DEPT OF [facility]  Dept: [phone]  and follow the prompts.  Office hours are 8:00 a.m. to 4:30 p.m. Monday - Friday. Please note that voicemails left after 4:00 p.m. may not be returned until the following business day.  We are closed weekends and major holidays. You have access to a nurse at all times for urgent questions. Please call the main number to the clinic Dept: [phone] and follow the prompts.   For any non-urgent questions, you may also contact your provider using MyChart. We now offer e-Visits for anyone 61 and older to request care online for non-urgent symptoms. For details visit [email or web address].   Also download the MyChart app! Go to the app store, search "MyChart", open the app, select , and log in with your MyChart username and password.

## 2024-01-04 ENCOUNTER — Other Ambulatory Visit

## 2024-01-04 ENCOUNTER — Ambulatory Visit: Admitting: Internal Medicine

## 2024-01-04 ENCOUNTER — Ambulatory Visit

## 2024-01-09 ENCOUNTER — Other Ambulatory Visit: Payer: Self-pay

## 2024-01-22 ENCOUNTER — Encounter (HOSPITAL_COMMUNITY): Payer: Self-pay

## 2024-01-22 ENCOUNTER — Ambulatory Visit (HOSPITAL_COMMUNITY)
Admission: RE | Admit: 2024-01-22 | Discharge: 2024-01-22 | Disposition: A | Discharge status: Home / Self Care | Source: Ambulatory Visit | Attending: Physician Assistant | Admitting: Physician Assistant

## 2024-01-22 DIAGNOSIS — Z1231 Encounter for screening mammogram for malignant neoplasm of breast: Secondary | ICD-10-CM | POA: Diagnosis not present

## 2024-01-25 ENCOUNTER — Inpatient Hospital Stay

## 2024-01-25 ENCOUNTER — Inpatient Hospital Stay: Attending: Physician Assistant | Admitting: Internal Medicine

## 2024-01-25 VITALS — BP 110/79 | HR 65 | Temp 98.1°F | Resp 17 | Ht 63.0 in | Wt 138.0 lb

## 2024-01-25 DIAGNOSIS — Z79620 Long term (current) use of immunosuppressive biologic: Secondary | ICD-10-CM | POA: Insufficient documentation

## 2024-01-25 DIAGNOSIS — C3490 Malignant neoplasm of unspecified part of unspecified bronchus or lung: Secondary | ICD-10-CM | POA: Diagnosis not present

## 2024-01-25 DIAGNOSIS — C3412 Malignant neoplasm of upper lobe, left bronchus or lung: Secondary | ICD-10-CM

## 2024-01-25 DIAGNOSIS — Z79631 Long term (current) use of antimetabolite agent: Secondary | ICD-10-CM | POA: Diagnosis not present

## 2024-01-25 DIAGNOSIS — Z5111 Encounter for antineoplastic chemotherapy: Secondary | ICD-10-CM | POA: Diagnosis present

## 2024-01-25 DIAGNOSIS — Z5112 Encounter for antineoplastic immunotherapy: Secondary | ICD-10-CM | POA: Diagnosis present

## 2024-01-25 DIAGNOSIS — Z95828 Presence of other vascular implants and grafts: Secondary | ICD-10-CM

## 2024-01-25 DIAGNOSIS — T451X5A Adverse effect of antineoplastic and immunosuppressive drugs, initial encounter: Secondary | ICD-10-CM

## 2024-01-25 LAB — CMP (CANCER CENTER ONLY)
ALT: 18 U/L (ref 0–44)
AST: 29 U/L (ref 15–41)
Albumin: 3.3 g/dL — ABNORMAL LOW (ref 3.5–5.0)
Alkaline Phosphatase: 121 U/L (ref 38–126)
Anion gap: 3 — ABNORMAL LOW (ref 5–15)
BUN: 27 mg/dL — ABNORMAL HIGH (ref 8–23)
CO2: 30 mmol/L (ref 22–32)
Calcium: 9.4 mg/dL (ref 8.9–10.3)
Chloride: 105 mmol/L (ref 98–111)
Creatinine: 1.22 mg/dL — ABNORMAL HIGH (ref 0.44–1.00)
GFR, Estimated: 47 mL/min — ABNORMAL LOW (ref >60)
Glucose, Bld: 62 mg/dL — ABNORMAL LOW (ref 70–99)
Potassium: 4.3 mmol/L (ref 3.5–5.1)
Sodium: 138 mmol/L (ref 135–145)
Total Bilirubin: 0.3 mg/dL (ref 0.0–1.2)
Total Protein: 7.7 g/dL (ref 6.5–8.1)

## 2024-01-25 LAB — CBC WITH DIFFERENTIAL (CANCER CENTER ONLY)
Abs Immature Granulocytes: 0.02 K/uL (ref 0.00–0.07)
Basophils Absolute: 0 K/uL (ref 0.0–0.1)
Basophils Relative: 1 %
Eosinophils Absolute: 0.1 K/uL (ref 0.0–0.5)
Eosinophils Relative: 1 %
HCT: 32.6 % — ABNORMAL LOW (ref 36.0–46.0)
Hemoglobin: 10.9 g/dL — ABNORMAL LOW (ref 12.0–15.0)
Immature Granulocytes: 1 %
Lymphocytes Relative: 39 %
Lymphs Abs: 1.7 K/uL (ref 0.7–4.0)
MCH: 31.7 pg (ref 26.0–34.0)
MCHC: 33.4 g/dL (ref 30.0–36.0)
MCV: 94.8 fL (ref 80.0–100.0)
Monocytes Absolute: 0.8 K/uL (ref 0.1–1.0)
Monocytes Relative: 19 %
Neutro Abs: 1.8 K/uL (ref 1.7–7.7)
Neutrophils Relative %: 39 %
Platelet Count: 352 K/uL (ref 150–400)
RBC: 3.44 MIL/uL — ABNORMAL LOW (ref 3.87–5.11)
RDW: 15.3 % (ref 11.5–15.5)
WBC Count: 4.4 K/uL (ref 4.0–10.5)
nRBC: 0 % (ref 0.0–0.2)

## 2024-01-25 LAB — TSH: TSH: 1.55 u[IU]/mL (ref 0.350–4.500)

## 2024-01-25 MED ORDER — CYANOCOBALAMIN 1000 MCG/ML IJ SOLN
1000.0000 ug | Freq: Once | INTRAMUSCULAR | Status: AC
  Administered 2024-01-25: 1000 ug via INTRAMUSCULAR
  Filled 2024-01-25: qty 1

## 2024-01-25 MED ORDER — SODIUM CHLORIDE 0.9% FLUSH
10.0000 mL | Freq: Once | INTRAVENOUS | Status: AC
  Administered 2024-01-25: 10 mL

## 2024-01-25 MED ORDER — SODIUM CHLORIDE 0.9 % IV SOLN: Freq: Once | INTRAVENOUS | Status: AC

## 2024-01-25 MED ORDER — SODIUM CHLORIDE 0.9 % IV SOLN
200.0000 mg | Freq: Once | INTRAVENOUS | Status: AC
  Administered 2024-01-25: 200 mg via INTRAVENOUS
  Filled 2024-01-25: qty 200

## 2024-01-25 MED ORDER — PROCHLORPERAZINE MALEATE 10 MG PO TABS
10.0000 mg | ORAL_TABLET | Freq: Once | ORAL | Status: AC
  Administered 2024-01-25: 10 mg via ORAL
  Filled 2024-01-25: qty 1

## 2024-01-25 MED ORDER — SODIUM CHLORIDE 0.9 % IV SOLN
400.0000 mg/m2 | Freq: Once | INTRAVENOUS | Status: AC
  Administered 2024-01-25: 600 mg via INTRAVENOUS
  Filled 2024-01-25: qty 20

## 2024-01-25 NOTE — Progress Notes (Signed)
 [facility] [phone]   [phone]  OFFICE PROGRESS NOTE  [name], [name] [address] [place] [address]  DIAGNOSIS:  Stage IV (T1c, N0, M1b) Non-Small Cell Lung Cancer, adenocarcinoma. She presented with a spiculated left upper lobe lung nodule and large right adrenal gland mass. She was diagnosed in July 2024. She had molecular studies that showed she has KRASG12C which can be used in the second line setting.    PDL1: 1%   PRIOR THERAPY: SBRT to the left upper lobe and right adrenal targets under the care of Dr. Dewey.  Last dose of treatment expected on 02/20/2023   CURRENT THERAPY: Systemic chemotherapy with carboplatin  for AUC of 5, Alimta  500 Mg/M2 and Keytruda  200 Mg IV every 3 weeks.  First dose April 05, 2023.Starting cycle #5 she has been on maintenance treatment with Alimta  and Keytruda  every 3 weeks.  Status post 14 cycles  INTERVAL HISTORY: Erin Pacheco 73 y.o. female returns to the clinic today for follow-up visit accompanied by her daughter. Discussed the use of AI scribe software for clinical note transcription with the patient, who gave verbal consent to proceed.  History of Present Illness Erin Pacheco is a 73 year old female who presents for evaluation before starting cycle number fifteen of her treatment. She is accompanied by her daughter.  She reports feeling good.  No chest pain, breathing issues, nausea, vomiting, diarrhea, or headaches. She mentions a slight itch on her left shoulder.    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  MEDICATIONS:  Current Outpatient Medications  Medication Sig Dispense  Refill   acetaminophen  (TYLENOL ) 325 MG tablet Take 2 tablets (650 mg total) by mouth every 6 (six) hours as needed for mild pain (or Fever >/= 101).     apixaban  (ELIQUIS ) 5 MG TABS tablet Take 1 tablet (5 mg total) by mouth 2 (two) times daily. 60 tablet 3   apixaban  (ELIQUIS ) 5 MG TABS tablet Take 1 tablet (5 mg total) by mouth 2 (two) times daily. 42 tablet 0   apixaban  (ELIQUIS ) 5 MG TABS tablet Take 1 tablet (5 mg total) by mouth 2 (two) times daily. 28 tablet 0   Fluticasone -Umeclidin-Vilant (TRELEGY ELLIPTA ) 100-62.5-25 MCG/ACT AEPB Inhale 1 each into the lungs daily.     folic acid  (FOLVITE ) 1 MG tablet Take 1 tablet (1 mg total) by mouth daily. Start 7 days before pemetrexed  chemotherapy. Continue until 21 days after pemetrexed  completed. 100 tablet 3   lidocaine -prilocaine  (EMLA ) cream APPLY TO THE PORT-A-CATH SITE 30 MINUTES BEFORE TREATMENT. 30 g 0   meclizine  (ANTIVERT ) 25 MG tablet Take 1 tablet (25 mg total) by mouth 3 (three) times daily as needed for dizziness. 14 tablet 0   metoprolol  succinate (TOPROL -XL) 50 MG 24 hr tablet TAKE 1 TABLET BY MOUTH EVERY DAY WITH OR IMMEDIATELY FOLLOWING A MEAL 90 tablet 3   Multiple Vitamin (MULTIVITAMIN) tablet Take 1 tablet by mouth daily.       ondansetron  (ZOFRAN ) 8 MG tablet Take 1 tablet (8 mg total) by mouth every 8 (eight) hours as needed for nausea or vomiting.  30 tablet 0   oxyCODONE  (OXY IR/ROXICODONE ) 5 MG immediate release tablet Take 1 tablet (5 mg total) by mouth every 6 (six) hours as needed for moderate pain. 60 tablet 0   pantoprazole  (PROTONIX ) 40 MG tablet Take 1 tablet (40 mg total) by mouth daily before breakfast. 30 tablet 1   Polyethyl Glycol-Propyl Glycol (SYSTANE OP) Place 1 drop into both eyes daily as needed (dry eye).     prochlorperazine  (COMPAZINE ) 10 MG tablet Take 1 tablet (10 mg total) by mouth every 6 (six) hours as needed for nausea or vomiting. 30 tablet 1   sacubitril-valsartan (ENTRESTO ) 49-51 MG TAKE 1 TABLET  BY MOUTH TWICE A DAY 60 tablet 11   spironolactone  (ALDACTONE ) 25 MG tablet Take 0.5 tablets (12.5 mg total) by mouth daily. Take half a tablet once daily. 45 tablet 3   No current facility-administered medications for this visit.   Facility-Administered Medications Ordered in Other Visits  Medication Dose Route Frequency Provider Last Rate Last Admin   heparin  lock flush 100 unit/mL  500 Units Intracatheter Once Sherrod Sherrod, MD       sodium chloride  flush (NS) 0.9 % injection 10 mL  10 mL Intracatheter Once Sherrod Sherrod, MD        SURGICAL HISTORY:  Past Surgical History:  Procedure Laterality Date   ABDOMINAL HYSTERECTOMY     BILATERAL OOPHORECTOMY     BIOPSY  12/09/2022   Procedure: BIOPSY;  Surgeon: Federico Rosario BROCKS, MD;  Location: [facility] ENDOSCOPY;  Service: Gastroenterology;;   BRONCHIAL BIOPSY  01/09/2023   Procedure: BRONCHIAL BIOPSIES;  Surgeon: Brenna Adine CROME, DO;  Location: [facility] ENDOSCOPY;  Service: Pulmonary;;   BRONCHIAL NEEDLE ASPIRATION BIOPSY  01/09/2023   Procedure: BRONCHIAL NEEDLE ASPIRATION BIOPSIES;  Surgeon: Brenna Adine CROME, DO;  Location: [facility] ENDOSCOPY;  Service: Pulmonary;;   CATARACT EXTRACTION W/PHACO  12/27/2010   Procedure: CATARACT EXTRACTION PHACO AND INTRAOCULAR LENS PLACEMENT (IOC);  Surgeon: Cherene Mania;  Location: [facility] ORS;  Service: Ophthalmology;  Laterality: Right;   CATARACT EXTRACTION W/PHACO  03/28/2011   Procedure: CATARACT EXTRACTION PHACO AND INTRAOCULAR LENS PLACEMENT (IOC);  Surgeon: Cherene Mania;  Location: [facility] ORS;  Service: Ophthalmology;  Laterality: Left;  CDE 7.27   ENTEROSCOPY N/A 02/28/2023   Procedure: ENTEROSCOPY;  Surgeon: Avram Lupita BRAVO, MD;  Location: [facility] ENDOSCOPY;  Service: Gastroenterology;  Laterality: N/A;   ESOPHAGOGASTRODUODENOSCOPY (EGD) WITH PROPOFOL  N/A 12/09/2022   Procedure: ESOPHAGOGASTRODUODENOSCOPY (EGD) WITH PROPOFOL ;  Surgeon: Federico Rosario BROCKS, MD;  Location[facility] ENDOSCOPY;  Service: Gastroenterology;  Laterality: N/A;   HOT  HEMOSTASIS N/A 02/28/2023   Procedure: HOT HEMOSTASIS (ARGON PLASMA COAGULATION/BICAP);  Surgeon: Avram Lupita BRAVO, MD;  Location: [facility];  Service: Gastroenterology;  Laterality: N/A;   IR IMAGING GUIDED PORT INSERTION  03/23/2023   SUBMUCOSAL TATTOO INJECTION  02/28/2023   Procedure: SUBMUCOSAL TATTOO INJECTION;  Surgeon: Avram Lupita BRAVO, MD;  Location: [facility] ENDOSCOPY;  Service: Gastroenterology;;    REVIEW OF SYSTEMS:  A comprehensive review of systems was negative.   PHYSICAL EXAMINATION: General appearance: alert, cooperative, and no distress Head: Normocephalic, without obvious abnormality, atraumatic Neck: no adenopathy, no JVD, supple, symmetrical, trachea midline, and thyroid  not enlarged, symmetric, no tenderness/mass/nodules Lymph nodes: Cervical, supraclavicular, and axillary nodes normal. Resp: clear to auscultation bilaterally Back: symmetric, no curvature. ROM normal. No CVA tenderness. Cardio: regular rate and rhythm, S1, S2 normal, no murmur, click, rub or gallop GI: soft, non-tender; bowel sounds normal; no masses,  no organomegaly Extremities: extremities normal, atraumatic,  no cyanosis or edema  ECOG PERFORMANCE STATUS: 1 - Symptomatic but completely ambulatory  Blood pressure 110/79, pulse 65, temperature 98.1 F (36.7 C), temperature source Temporal, resp. rate 17, height 5' 3 (1.6 m), weight 138 lb (62.6 kg), SpO2 100%.  LABORATORY DATA: Lab Results  Component Value Date   WBC 4.4 01/03/2024   HGB 11.7 (L) 01/03/2024   HCT 34.8 (L) 01/03/2024   MCV 94.3 01/03/2024   PLT 213 01/03/2024      Chemistry      Component Value Date/Time   NA 137 01/03/2024 1342   NA 137 09/27/2023 1224   K 4.4 01/03/2024 1342   CL 105 01/03/2024 1342   CO2 28 01/03/2024 1342   BUN 28 (H) 01/03/2024 1342   BUN 26 09/27/2023 1224   CREATININE 1.37 (H) 01/03/2024 1342      Component Value Date/Time   CALCIUM 9.4 01/03/2024 1342   ALKPHOS 131 (H) 01/03/2024 1342   AST 32  01/03/2024 1342   ALT 22 01/03/2024 1342   BILITOT 0.5 01/03/2024 1342       RADIOGRAPHIC STUDIES: MM 3D SCREENING MAMMOGRAM BILATERAL BREAST Result Date: 01/25/2024 CLINICAL DATA:  Screening. EXAM: DIGITAL SCREENING BILATERAL MAMMOGRAM WITH TOMOSYNTHESIS AND CAD TECHNIQUE: Bilateral screening digital craniocaudal and mediolateral oblique mammograms were obtained. Bilateral screening digital breast tomosynthesis was performed. The images were evaluated with computer-aided detection. COMPARISON:  Previous exam(s). ACR Breast Density Category b: There are scattered areas of fibroglandular density. FINDINGS: There are no findings suspicious for malignancy. IMPRESSION: No mammographic evidence of malignancy. A result letter of this screening mammogram will be mailed directly to the patient. RECOMMENDATION: Screening mammogram in one year. (Code:SM-B-01Y) BI-RADS CATEGORY  1: Negative. Electronically Signed   By: Rosina Gelineau M.D.   On: 01/25/2024 08:38      ASSESSMENT AND PLAN: This is a very pleasant 73 years old African-American female recently diagnosed with a stage IV (T1c, N0, M1b) non-small cell lung cancer, adenocarcinoma with positive KRAS G12C mutation and PD-L1 expression of 1%.  She started systemic chemotherapy with carboplatin  for AUC of 5, Alimta  500 Mg/M2 and Keytruda  200 Mg IV every 3 weeks.  She is status post 14 cycle of her treatment. The patient has been tolerating this treatment well with no concerning adverse effects. Assessment and Plan Assessment & Plan Active malignancy under chemotherapy Undergoing cycle number fifteen of chemotherapy for an active malignancy. Reports feeling good with no new complaints related to the treatment. Blood work is acceptable for continuing treatment. A scan is planned in two weeks to assess the effectiveness of the treatment. If the scan results are favorable, the current treatment regimen will be continued. - Proceed with cycle number fifteen  of chemotherapy - Order scan in two weeks to evaluate treatment efficacy  Pruritus of left shoulder area Reports pruritus in the left shoulder area. No further details on severity or duration were provided. She was advised to call immediately if she has any other concerning symptoms in the interval.  The patient voices understanding of current disease status and treatment options and is in agreement with the current care plan.  All questions were answered. The patient knows to call the clinic with any problems, questions or concerns. We can certainly see the patient much sooner if necessary. The total time spent in the appointment was 20 minutes.  Disclaimer: This note was dictated with voice recognition software. Similar sounding words can inadvertently be transcribed and may not be corrected upon review.

## 2024-01-25 NOTE — Patient Instructions (Signed)
 [facility] - A DEPT OF [facility]  Discharge Instructions: Thank you for choosing [facility] to provide your oncology and hematology care.   If you have a lab appointment with the Cancer Center, please go directly to the Cancer Center and check in at the registration area.   Wear comfortable clothing and clothing appropriate for easy access to any Portacath or PICC line.   We strive to give you quality time with your provider. You may need to reschedule your appointment if you arrive late (15 or more minutes).  Arriving late affects you and other patients whose appointments are after yours.  Also, if you miss three or more appointments without notifying the office, you may be dismissed from the clinic at the provider's discretion.      For prescription refill requests, have your pharmacy contact our office and allow 72 hours for refills to be completed.    Today you received the following chemotherapy and/or immunotherapy agents: Keytruda, Alimta.       To help prevent nausea and vomiting after your treatment, we encourage you to take your nausea medication as directed.  BELOW ARE SYMPTOMS THAT SHOULD BE REPORTED IMMEDIATELY: *FEVER GREATER THAN 100.4 F (38 C) OR HIGHER *CHILLS OR SWEATING *NAUSEA AND VOMITING THAT IS NOT CONTROLLED WITH YOUR NAUSEA MEDICATION *UNUSUAL SHORTNESS OF BREATH *UNUSUAL BRUISING OR BLEEDING *URINARY PROBLEMS (pain or burning when urinating, or frequent urination) *BOWEL PROBLEMS (unusual diarrhea, constipation, pain near the anus) TENDERNESS IN MOUTH AND THROAT WITH OR WITHOUT PRESENCE OF ULCERS (sore throat, sores in mouth, or a toothache) UNUSUAL RASH, SWELLING OR PAIN  UNUSUAL VAGINAL DISCHARGE OR ITCHING   Items with * indicate a potential emergency and should be followed up as soon as possible or go to the Emergency Department if any problems should occur.  Please show the CHEMOTHERAPY ALERT CARD or  IMMUNOTHERAPY ALERT CARD at check-in to the Emergency Department and triage nurse.  Should you have questions after your visit or need to cancel or reschedule your appointment, please contact [facility] - A DEPT OF [facility]  Dept: [phone]  and follow the prompts.  Office hours are 8:00 a.m. to 4:30 p.m. Monday - Friday. Please note that voicemails left after 4:00 p.m. may not be returned until the following business day.  We are closed weekends and major holidays. You have access to a nurse at all times for urgent questions. Please call the main number to the clinic Dept: [phone] and follow the prompts.   For any non-urgent questions, you may also contact your provider using MyChart. We now offer e-Visits for anyone 61 and older to request care online for non-urgent symptoms. For details visit [email or web address].   Also download the MyChart app! Go to the app store, search "MyChart", open the app, select , and log in with your MyChart username and password.

## 2024-01-26 LAB — T4: T4, Total: 7.6 ug/dL (ref 4.5–12.0)

## 2024-02-08 ENCOUNTER — Ambulatory Visit (HOSPITAL_COMMUNITY)
Admission: RE | Admit: 2024-02-08 | Discharge: 2024-02-08 | Disposition: A | Discharge status: Home / Self Care | Source: Ambulatory Visit | Attending: Internal Medicine | Admitting: Internal Medicine

## 2024-02-08 DIAGNOSIS — C3490 Malignant neoplasm of unspecified part of unspecified bronchus or lung: Secondary | ICD-10-CM | POA: Diagnosis not present

## 2024-02-08 DIAGNOSIS — I700 Atherosclerosis of aorta: Secondary | ICD-10-CM | POA: Diagnosis not present

## 2024-02-08 MED ORDER — IOHEXOL 300 MG/ML  SOLN
100.0000 mL | Freq: Once | INTRAMUSCULAR | Status: AC | PRN
  Administered 2024-02-08: 100 mL via INTRAVENOUS

## 2024-02-15 ENCOUNTER — Inpatient Hospital Stay

## 2024-02-15 ENCOUNTER — Encounter: Payer: Self-pay | Admitting: Internal Medicine

## 2024-02-15 ENCOUNTER — Inpatient Hospital Stay (HOSPITAL_BASED_OUTPATIENT_CLINIC_OR_DEPARTMENT_OTHER): Admitting: Internal Medicine

## 2024-02-15 ENCOUNTER — Inpatient Hospital Stay: Attending: Physician Assistant

## 2024-02-15 VITALS — BP 107/51 | HR 67 | Temp 97.7°F | Resp 17 | Ht 63.0 in | Wt 140.0 lb

## 2024-02-15 DIAGNOSIS — C7971 Secondary malignant neoplasm of right adrenal gland: Secondary | ICD-10-CM | POA: Insufficient documentation

## 2024-02-15 DIAGNOSIS — Z79631 Long term (current) use of antimetabolite agent: Secondary | ICD-10-CM | POA: Insufficient documentation

## 2024-02-15 DIAGNOSIS — Z720 Tobacco use: Secondary | ICD-10-CM | POA: Insufficient documentation

## 2024-02-15 DIAGNOSIS — C3492 Malignant neoplasm of unspecified part of left bronchus or lung: Secondary | ICD-10-CM | POA: Diagnosis not present

## 2024-02-15 DIAGNOSIS — C3412 Malignant neoplasm of upper lobe, left bronchus or lung: Secondary | ICD-10-CM | POA: Diagnosis not present

## 2024-02-15 DIAGNOSIS — Z79620 Long term (current) use of immunosuppressive biologic: Secondary | ICD-10-CM | POA: Diagnosis not present

## 2024-02-15 DIAGNOSIS — Z5112 Encounter for antineoplastic immunotherapy: Secondary | ICD-10-CM | POA: Diagnosis present

## 2024-02-15 DIAGNOSIS — Z5111 Encounter for antineoplastic chemotherapy: Secondary | ICD-10-CM | POA: Diagnosis present

## 2024-02-15 LAB — CMP (CANCER CENTER ONLY)
ALT: 16 U/L (ref 0–44)
AST: 26 U/L (ref 15–41)
Albumin: 3.2 g/dL — ABNORMAL LOW (ref 3.5–5.0)
Alkaline Phosphatase: 116 U/L (ref 38–126)
Anion gap: 3 — ABNORMAL LOW (ref 5–15)
BUN: 26 mg/dL — ABNORMAL HIGH (ref 8–23)
CO2: 30 mmol/L (ref 22–32)
Calcium: 8.8 mg/dL — ABNORMAL LOW (ref 8.9–10.3)
Chloride: 105 mmol/L (ref 98–111)
Creatinine: 1.4 mg/dL — ABNORMAL HIGH (ref 0.44–1.00)
GFR, Estimated: 40 mL/min — ABNORMAL LOW (ref >60)
Glucose, Bld: 97 mg/dL (ref 70–99)
Potassium: 4.1 mmol/L (ref 3.5–5.1)
Sodium: 138 mmol/L (ref 135–145)
Total Bilirubin: 0.2 mg/dL (ref 0.0–1.2)
Total Protein: 7.5 g/dL (ref 6.5–8.1)

## 2024-02-15 LAB — CBC WITH DIFFERENTIAL (CANCER CENTER ONLY)
Abs Immature Granulocytes: 0.05 K/uL (ref 0.00–0.07)
Basophils Absolute: 0 K/uL (ref 0.0–0.1)
Basophils Relative: 1 %
Eosinophils Absolute: 0.1 K/uL (ref 0.0–0.5)
Eosinophils Relative: 1 %
HCT: 31.3 % — ABNORMAL LOW (ref 36.0–46.0)
Hemoglobin: 10.6 g/dL — ABNORMAL LOW (ref 12.0–15.0)
Immature Granulocytes: 1 %
Lymphocytes Relative: 34 %
Lymphs Abs: 1.8 K/uL (ref 0.7–4.0)
MCH: 32.1 pg (ref 26.0–34.0)
MCHC: 33.9 g/dL (ref 30.0–36.0)
MCV: 94.8 fL (ref 80.0–100.0)
Monocytes Absolute: 0.8 K/uL (ref 0.1–1.0)
Monocytes Relative: 16 %
Neutro Abs: 2.5 K/uL (ref 1.7–7.7)
Neutrophils Relative %: 47 %
Platelet Count: 338 K/uL (ref 150–400)
RBC: 3.3 MIL/uL — ABNORMAL LOW (ref 3.87–5.11)
RDW: 16.4 % — ABNORMAL HIGH (ref 11.5–15.5)
WBC Count: 5.3 K/uL (ref 4.0–10.5)
nRBC: 0.4 % — ABNORMAL HIGH (ref 0.0–0.2)

## 2024-02-15 MED ORDER — SODIUM CHLORIDE 0.9 % IV SOLN
400.0000 mg/m2 | Freq: Once | INTRAVENOUS | Status: AC
  Administered 2024-02-15: 600 mg via INTRAVENOUS
  Filled 2024-02-15: qty 20

## 2024-02-15 MED ORDER — SODIUM CHLORIDE 0.9 % IV SOLN: Freq: Once | INTRAVENOUS | Status: AC

## 2024-02-15 MED ORDER — PROCHLORPERAZINE MALEATE 10 MG PO TABS
10.0000 mg | ORAL_TABLET | Freq: Once | ORAL | Status: AC
  Administered 2024-02-15: 10 mg via ORAL
  Filled 2024-02-15: qty 1

## 2024-02-15 MED ORDER — SODIUM CHLORIDE 0.9 % IV SOLN
200.0000 mg | Freq: Once | INTRAVENOUS | Status: AC
  Administered 2024-02-15: 200 mg via INTRAVENOUS
  Filled 2024-02-15: qty 200

## 2024-02-15 NOTE — Progress Notes (Signed)
 [facility] [phone]   [phone]  OFFICE PROGRESS NOTE  [name], [name] [address] [place] [address]  DIAGNOSIS:  Stage IV (T1c, N0, M1b) Non-Small Cell Lung Cancer, adenocarcinoma. She presented with a spiculated left upper lobe lung nodule and large right adrenal gland mass. She was diagnosed in July 2024. She had molecular studies that showed she has KRASG12C which can be used in the second line setting.    PDL1: 1%   PRIOR THERAPY: SBRT to the left upper lobe and right adrenal targets under the care of Dr. Dewey.  Last dose of treatment expected on 02/20/2023   CURRENT THERAPY: Systemic chemotherapy with carboplatin  for AUC of 5, Alimta  500 Mg/M2 and Keytruda  200 Mg IV every 3 weeks.  First dose April 05, 2023.Starting cycle #5 she has been on maintenance treatment with Alimta  and Keytruda  every 3 weeks.  Status post 15 cycles  INTERVAL HISTORY: Erin Pacheco 73 y.o. female returns to the clinic today for follow-up visit accompanied by her daughter. Discussed the use of AI scribe software for clinical note transcription with the patient, who gave verbal consent to proceed.  History of Present Illness Erin Pacheco is a 73 year old female with stage 4 non-small cell lung cancer who presents for palliative systemic chemoimmunotherapy cycle number sixteen. She is accompanied by her daughter.  She was diagnosed with stage 4 non-small cell lung cancer, adenocarcinoma, in July 2024, with a positive KRAS G12C mutation and PD-L1 expression of 1%. She is currently undergoing palliative systemic chemoimmunotherapy and is about to start her sixteenth cycle. She has experienced mild anemia as a side effect of the treatment. Her kidney function has previously run on the higher side, and the current results are not yet available.  She has no new complaints today. She experiences headaches with weather changes, which improve  with sinus tablets. No chest pain, breathing issues, nausea, vomiting, or diarrhea.    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  MEDICATIONS:  Current Outpatient Medications  Medication Sig Dispense Refill   acetaminophen  (TYLENOL ) 325 MG tablet Take 2 tablets (650 mg total) by mouth every 6 (six) hours as needed for mild pain (or Fever >/= 101).     apixaban  (ELIQUIS ) 5 MG TABS tablet Take 1 tablet (5 mg total) by mouth 2 (two) times daily. 60 tablet 3   apixaban  (ELIQUIS ) 5 MG TABS tablet Take 1 tablet (5 mg total) by mouth 2 (two) times daily. 42 tablet 0   apixaban  (ELIQUIS ) 5 MG TABS tablet Take 1 tablet (5 mg total) by mouth 2 (two) times daily. 28 tablet 0   Fluticasone -Umeclidin-Vilant (TRELEGY ELLIPTA ) 100-62.5-25 MCG/ACT AEPB Inhale 1 each into the lungs daily.     folic acid  (FOLVITE ) 1 MG tablet Take 1 tablet (1 mg total) by mouth daily. Start 7 days before pemetrexed  chemotherapy. Continue until 21 days after pemetrexed  completed. 100 tablet 3   lidocaine -prilocaine  (EMLA ) cream APPLY TO THE PORT-A-CATH SITE 30 MINUTES BEFORE TREATMENT. 30 g 0   meclizine  (ANTIVERT ) 25 MG tablet Take 1 tablet (25 mg total) by mouth 3 (  three) times daily as needed for dizziness. 14 tablet 0   metoprolol  succinate (TOPROL -XL) 50 MG 24 hr tablet TAKE 1 TABLET BY MOUTH EVERY DAY WITH OR IMMEDIATELY FOLLOWING A MEAL 90 tablet 3   Multiple Vitamin (MULTIVITAMIN) tablet Take 1 tablet by mouth daily.       ondansetron  (ZOFRAN ) 8 MG tablet Take 1 tablet (8 mg total) by mouth every 8 (eight) hours as needed for nausea or vomiting. 30 tablet 0   oxyCODONE  (OXY IR/ROXICODONE ) 5 MG immediate release tablet Take 1 tablet (5 mg  total) by mouth every 6 (six) hours as needed for moderate pain. 60 tablet 0   pantoprazole  (PROTONIX ) 40 MG tablet Take 1 tablet (40 mg total) by mouth daily before breakfast. 30 tablet 1   Polyethyl Glycol-Propyl Glycol (SYSTANE OP) Place 1 drop into both eyes daily as needed (dry eye).     prochlorperazine  (COMPAZINE ) 10 MG tablet Take 1 tablet (10 mg total) by mouth every 6 (six) hours as needed for nausea or vomiting. 30 tablet 1   sacubitril-valsartan (ENTRESTO ) 49-51 MG TAKE 1 TABLET BY MOUTH TWICE A DAY 60 tablet 11   spironolactone  (ALDACTONE ) 25 MG tablet Take 0.5 tablets (12.5 mg total) by mouth daily. Take half a tablet once daily. 45 tablet 3   No current facility-administered medications for this visit.   Facility-Administered Medications Ordered in Other Visits  Medication Dose Route Frequency Provider Last Rate Last Admin   heparin  lock flush 100 unit/mL  500 Units Intracatheter Once Sherrod Sherrod, MD       sodium chloride  flush (NS) 0.9 % injection 10 mL  10 mL Intracatheter Once Sherrod Sherrod, MD        SURGICAL HISTORY:  Past Surgical History:  Procedure Laterality Date   ABDOMINAL HYSTERECTOMY     BILATERAL OOPHORECTOMY     BIOPSY  12/09/2022   Procedure: BIOPSY;  Surgeon: Federico Rosario BROCKS, MD;  Location: [facility] ENDOSCOPY;  Service: Gastroenterology;;   BRONCHIAL BIOPSY  01/09/2023   Procedure: BRONCHIAL BIOPSIES;  Surgeon: Brenna Adine CROME, DO;  Location: [facility] ENDOSCOPY;  Service: Pulmonary;;   BRONCHIAL NEEDLE ASPIRATION BIOPSY  01/09/2023   Procedure: BRONCHIAL NEEDLE ASPIRATION BIOPSIES;  Surgeon: Brenna Adine CROME, DO;  Location: [facility] ENDOSCOPY;  Service: Pulmonary;;   CATARACT EXTRACTION W/PHACO  12/27/2010   Procedure: CATARACT EXTRACTION PHACO AND INTRAOCULAR LENS PLACEMENT (IOC);  Surgeon: Cherene Mania;  Location: [facility] ORS;  Service: Ophthalmology;  Laterality: Right;   CATARACT EXTRACTION W/PHACO  03/28/2011   Procedure: CATARACT EXTRACTION PHACO AND INTRAOCULAR LENS  PLACEMENT (IOC);  Surgeon: Cherene Mania;  Location: [facility] ORS;  Service: Ophthalmology;  Laterality: Left;  CDE 7.27   ENTEROSCOPY N/A 02/28/2023   Procedure: ENTEROSCOPY;  Surgeon: Avram Lupita BRAVO, MD;  Location: [facility] ENDOSCOPY;  Service: Gastroenterology;  Laterality: N/A;   ESOPHAGOGASTRODUODENOSCOPY (EGD) WITH PROPOFOL  N/A 12/09/2022   Procedure: ESOPHAGOGASTRODUODENOSCOPY (EGD) WITH PROPOFOL ;  Surgeon: Federico Rosario BROCKS, MD;  Location: [facility] ENDOSCOPY;  Service: Gastroenterology;  Laterality: N/A;   HOT HEMOSTASIS N/A 02/28/2023   Procedure: HOT HEMOSTASIS (ARGON PLASMA COAGULATION/BICAP);  Surgeon: Avram Lupita BRAVO, MD;  Location: [facility];  Service: Gastroenterology;  Laterality: N/A;   IR IMAGING GUIDED PORT INSERTION  03/23/2023   SUBMUCOSAL TATTOO INJECTION  02/28/2023   Procedure: SUBMUCOSAL TATTOO INJECTION;  Surgeon: Avram Lupita BRAVO, MD;  Location: [facility] ENDOSCOPY;  Service: Gastroenterology;;    REVIEW OF SYSTEMS:  Constitutional: positive for fatigue Eyes: negative Ears, nose, mouth, throat, and face: negative Respiratory:  negative Cardiovascular: negative Gastrointestinal: negative Genitourinary:negative Integument/breast: negative Hematologic/lymphatic: negative Musculoskeletal:negative Neurological: negative Behavioral/Psych: negative Endocrine: negative Allergic/Immunologic: negative   PHYSICAL EXAMINATION: General appearance: alert, cooperative, and no distress Head: Normocephalic, without obvious abnormality, atraumatic Neck: no adenopathy, no JVD, supple, symmetrical, trachea midline, and thyroid  not enlarged, symmetric, no tenderness/mass/nodules Lymph nodes: Cervical, supraclavicular, and axillary nodes normal. Resp: clear to auscultation bilaterally Back: symmetric, no curvature. ROM normal. No CVA tenderness. Cardio: regular rate and rhythm, S1, S2 normal, no murmur, click, rub or gallop GI: soft, non-tender; bowel sounds normal; no masses,  no organomegaly Extremities:  extremities normal, atraumatic, no cyanosis or edema Neurologic: Alert and oriented X 3, normal strength and tone. Normal symmetric reflexes. Normal coordination and gait  ECOG PERFORMANCE STATUS: 1 - Symptomatic but completely ambulatory  Blood pressure (!) 107/51, pulse 67, temperature 97.7 F (36.5 C), resp. rate 17, height 5' 3 (1.6 m), weight 140 lb (63.5 kg), SpO2 100%.  LABORATORY DATA: Lab Results  Component Value Date   WBC 4.4 01/25/2024   HGB 10.9 (L) 01/25/2024   HCT 32.6 (L) 01/25/2024   MCV 94.8 01/25/2024   PLT 352 01/25/2024      Chemistry      Component Value Date/Time   NA 138 01/25/2024 1324   NA 137 09/27/2023 1224   K 4.3 01/25/2024 1324   CL 105 01/25/2024 1324   CO2 30 01/25/2024 1324   BUN 27 (H) 01/25/2024 1324   BUN 26 09/27/2023 1224   CREATININE 1.22 (H) 01/25/2024 1324      Component Value Date/Time   CALCIUM 9.4 01/25/2024 1324   ALKPHOS 121 01/25/2024 1324   AST 29 01/25/2024 1324   ALT 18 01/25/2024 1324   BILITOT 0.3 01/25/2024 1324       RADIOGRAPHIC STUDIES: CT CHEST ABDOMEN PELVIS W CONTRAST Result Date: 02/14/2024 CLINICAL DATA:  Non-small cell lung cancer, staging. Left lung post chemo and radiation on oral chemotherapy. * Tracking Code: BO * EXAM: CT CHEST, ABDOMEN, AND PELVIS WITH CONTRAST TECHNIQUE: Multidetector CT imaging of the chest, abdomen and pelvis was performed following the standard protocol during bolus administration of intravenous contrast. RADIATION DOSE REDUCTION: This exam was performed according to the departmental dose-optimization program which includes automated exposure control, adjustment of the mA and/or kV according to patient size and/or use of iterative reconstruction technique. CONTRAST:  OMNIPAQUE  IOHEXOL  300 MG/ML  SOLN COMPARISON:  Multiple priors including CT December 12, 2023 FINDINGS: CT CHEST FINDINGS Cardiovascular: Right chest Port-A-Cath with tip near the superior cavoatrial junction. Aortic  atherosclerosis. Coronary artery calcifications. Normal size heart. Trace pericardial effusion. Mediastinum/Nodes: No suspicious thyroid  nodule. No pathologically enlarged mediastinal, hilar or axillary lymph nodes. Gas fluid levels in a patulous esophagus. Lungs/Pleura: Spiculated left upper lobe pulmonary nodule measures 13 x 10 mm on image 41/4 previously 14 x 11 mm. There is progressive coarse interstitial thickening with bronchiectasis/bronchiolectasis and architectural distortion in the left upper lobe and a lesser extent the superior segment of the left lower lobe favored posttreatment change. Similar mucoid impaction of a left upper lobe bronchus along the inferior margin of the treatment bed on image 49/4. Stable irregular anterior left lower lobe pulmonary nodule measuring 7 mm on image 74/4. Stable irregular anterior left lower lobe pulmonary nodule measuring 6 mm on image 86/4. Similar scarring/atelectasis in the right middle lobe and dependent bilateral lower lobes. Musculoskeletal: No aggressive lytic or blastic lesion of bone. Advanced degenerative change of the bilateral shoulders. Thoracic spondylosis. CT ABDOMEN  PELVIS FINDINGS Hepatobiliary: No suspicious hepatic lesion. Gallbladder is nondistended. No biliary ductal dilation. Pancreas: No splenomegaly or focal splenic lesion. Spleen: No splenomegaly or focal splenic lesion. Adrenals/Urinary Tract: Right adrenal mass measures 5.4 X 3.1 cm on image 53/2 previously 5.8 x 3.1 cm. No suspicious left adrenal nodule/mass. Similar fluid/soft tissue along the right side of the kidney on image 59/2 extending from the right adrenal mass. No hydronephrosis. Slight hypoenhancement of the upper pole right kidney. Right renal cysts. Urinary bladder is unremarkable for degree of distension. Stomach/Bowel: Wall thickening of the gastric antrum on image 65/2. no pathologic dilation of large or small bowel. Vascular/Lymphatic: Aortic atherosclerosis. No  pathologically enlarged abdominal or pelvic lymph nodes. Reproductive: Status post hysterectomy. No adnexal masses. Other: No significant abdominopelvic free fluid. Musculoskeletal: No aggressive lytic or blastic lesion of bone. Multilevel degenerative changes spine. IMPRESSION: 1. Stable spiculated left upper lobe pulmonary nodule with progressive coarse interstitial thickening with bronchiectasis/bronchiolectasis and architectural distortion in the left upper lobe and a lesser extent the superior segment of the left lower lobe favored to reflect posttreatment change. 2. Stable irregular left lower lobe pulmonary nodules. 3. Slight interval decrease in size of the right adrenal mass. 4. Slight hypoenhancement of the upper pole right kidney favored posttreatment change. Consider correlation with urinalysis to exclude infection. 5. Wall thickening of the gastric antrum, which may reflect gastritis. 6. Gas fluid levels in a patulous esophagus, suggestive of gastroesophageal reflux. Electronically Signed   By: Reyes Holder M.D.   On: 02/14/2024 15:55   MM 3D SCREENING MAMMOGRAM BILATERAL BREAST Result Date: 01/25/2024 CLINICAL DATA:  Screening. EXAM: DIGITAL SCREENING BILATERAL MAMMOGRAM WITH TOMOSYNTHESIS AND CAD TECHNIQUE: Bilateral screening digital craniocaudal and mediolateral oblique mammograms were obtained. Bilateral screening digital breast tomosynthesis was performed. The images were evaluated with computer-aided detection. COMPARISON:  Previous exam(s). ACR Breast Density Category b: There are scattered areas of fibroglandular density. FINDINGS: There are no findings suspicious for malignancy. IMPRESSION: No mammographic evidence of malignancy. A result letter of this screening mammogram will be mailed directly to the patient. RECOMMENDATION: Screening mammogram in one year. (Code:SM-B-01Y) BI-RADS CATEGORY  1: Negative. Electronically Signed   By: Rosina Gelineau M.D.   On: 01/25/2024 08:38       ASSESSMENT AND PLAN: This is a very pleasant 73 years old African-American female recently diagnosed with a stage IV (T1c, N0, M1b) non-small cell lung cancer, adenocarcinoma with positive KRAS G12C mutation and PD-L1 expression of 1%.  She started systemic chemotherapy with carboplatin  for AUC of 5, Alimta  500 Mg/M2 and Keytruda  200 Mg IV every 3 weeks.  She is status post 15 cycle of her treatment.  Her current dose of Alimta  is 400 mg/M2.  This was reduced secondary to renal insufficiency. The patient has been tolerating this treatment well with no concerning adverse effects. She had repeat CT scan of the chest, abdomen and pelvis performed recently.  I personally independently reviewed the scan and discussed the result with the patient and her daughter today.  Her scan showed no concerning findings for disease progression. Assessment and Plan Assessment & Plan Stage 4 non-small cell lung cancer, adenocarcinoma with KRAS G12C mutation and PD-L1 expression Stage 4 non-small cell lung cancer, adenocarcinoma, with KRAS G12C mutation and PD-L1 expression of 1%, diagnosed in July 2024. Currently on palliative systemic chemoimmunotherapy, now starting cycle 16. Recent scan shows mild improvement and stability with no new growth. Blood work is adequate for treatment continuation. - Administer cycle 16 of  chemoimmunotherapy with Keytruda  and Alimta , contingent on kidney function results. - Provide information about the Lung Forward event on October 4th at the [facility].  Anemia secondary to chemotherapy Mild anemia likely secondary to ongoing chemotherapy treatment, a known side effect of the regimen.  Chronic kidney disease, unspecified stage Chronic kidney disease with historically higher kidney function levels. Awaiting current kidney function results to determine treatment plan. Previous results showed improvement towards normal levels. - Review kidney function results when available. -  If kidney function is adequate, continue Alimta  with Keytruda . - If kidney function is poor, omit Alimta  and continue with Keytruda  only. The patient was advised to call immediately if she has any concerning symptoms in the interval.  The patient voices understanding of current disease status and treatment options and is in agreement with the current care plan.  All questions were answered. The patient knows to call the clinic with any problems, questions or concerns. We can certainly see the patient much sooner if necessary. The total time spent in the appointment was 30 minutes.  Disclaimer: This note was dictated with voice recognition software. Similar sounding words can inadvertently be transcribed and may not be corrected upon review.

## 2024-02-15 NOTE — Patient Instructions (Signed)
 [facility] - A DEPT OF [facility]  Discharge Instructions: Thank you for choosing [facility] to provide your oncology and hematology care.   If you have a lab appointment with the Cancer Center, please go directly to the Cancer Center and check in at the registration area.   Wear comfortable clothing and clothing appropriate for easy access to any Portacath or PICC line.   We strive to give you quality time with your provider. You may need to reschedule your appointment if you arrive late (15 or more minutes).  Arriving late affects you and other patients whose appointments are after yours.  Also, if you miss three or more appointments without notifying the office, you may be dismissed from the clinic at the provider's discretion.      For prescription refill requests, have your pharmacy contact our office and allow 72 hours for refills to be completed.    Today you received the following chemotherapy and/or immunotherapy agents: Keytruda, Alimta.       To help prevent nausea and vomiting after your treatment, we encourage you to take your nausea medication as directed.  BELOW ARE SYMPTOMS THAT SHOULD BE REPORTED IMMEDIATELY: *FEVER GREATER THAN 100.4 F (38 C) OR HIGHER *CHILLS OR SWEATING *NAUSEA AND VOMITING THAT IS NOT CONTROLLED WITH YOUR NAUSEA MEDICATION *UNUSUAL SHORTNESS OF BREATH *UNUSUAL BRUISING OR BLEEDING *URINARY PROBLEMS (pain or burning when urinating, or frequent urination) *BOWEL PROBLEMS (unusual diarrhea, constipation, pain near the anus) TENDERNESS IN MOUTH AND THROAT WITH OR WITHOUT PRESENCE OF ULCERS (sore throat, sores in mouth, or a toothache) UNUSUAL RASH, SWELLING OR PAIN  UNUSUAL VAGINAL DISCHARGE OR ITCHING   Items with * indicate a potential emergency and should be followed up as soon as possible or go to the Emergency Department if any problems should occur.  Please show the CHEMOTHERAPY ALERT CARD or  IMMUNOTHERAPY ALERT CARD at check-in to the Emergency Department and triage nurse.  Should you have questions after your visit or need to cancel or reschedule your appointment, please contact [facility] - A DEPT OF [facility]  Dept: [phone]  and follow the prompts.  Office hours are 8:00 a.m. to 4:30 p.m. Monday - Friday. Please note that voicemails left after 4:00 p.m. may not be returned until the following business day.  We are closed weekends and major holidays. You have access to a nurse at all times for urgent questions. Please call the main number to the clinic Dept: [phone] and follow the prompts.   For any non-urgent questions, you may also contact your provider using MyChart. We now offer e-Visits for anyone 61 and older to request care online for non-urgent symptoms. For details visit [email or web address].   Also download the MyChart app! Go to the app store, search "MyChart", open the app, select , and log in with your MyChart username and password.

## 2024-02-16 ENCOUNTER — Other Ambulatory Visit: Payer: Self-pay

## 2024-03-01 NOTE — Progress Notes (Unsigned)
 [facility] OFFICE PROGRESS NOTE  Erin Joesph DEL, [name] [address] [place] [address]  DIAGNOSIS: Stage IV (T1c, N0, M1b) Non-Small Cell Lung Cancer, adenocarcinoma. She presented with a spiculated left upper lobe lung nodule and large right adrenal gland mass. She was diagnosed in July 2024. She had molecular studies that showed she has KRASG12C which can be used in the second line setting.    PDL1: 1%   Molecular Studies: STK11 but likely not candidate for 4 drug combination and KRASG12C  PRIOR THERAPY: SBRT to the left upper lobe and right adrenal targets under the care of Dr. Dewey. Last dose of treatment expected on 02/20/2023   CURRENT THERAPY: Systemic chemotherapy with carboplatin  for AUC of 5, Alimta  500 Mg/M2 and Keytruda  200 Mg IV every 3 weeks.  First dose April 05, 2023. Status post 16 cycles. Starting from cycle #5, she started maintenance Alimta  and Keytruda .  2) IV iron  with Venofer  300 mg weekly PRN. Last dose on 03/21/23.  INTERVAL HISTORY: Erin Pacheco 73 y.o. female returns to the clinic today for a follow up visit. The patient is currently undergoing maintenance chemotherapy and immunotherapy.     She denies changes in her health today. She denies any fever, chills, or night sweats.  The patient states that she got no problem with her breathing.  She states that if her breathing acts up that she will use her inhaler and she has not needed to use this since August. Denies any chest pain or hemoptysis. She sometimes struggles with constipation intermittently but has laxatives with MiraLAX  if needed. Denies any headache or visual changes.  Denies any rashes or skin changes. She is compliant with her iron  supplement.  The patient has a history of iron  deficiency anemia secondary to GI bleeding.  She is on Eliquis  and compliant.  She previously received IV iron . She is here today for evaluation and repeat blood work before undergoing  cycle #17.    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  MEDICATIONS:  Current Outpatient Medications  Medication Sig Dispense Refill   acetaminophen  (TYLENOL ) 325 MG tablet Take 2 tablets (650 mg total) by mouth every 6 (six) hours as needed for mild pain (or Fever >/= 101).     apixaban  (ELIQUIS ) 5 MG TABS tablet Take 1 tablet (5 mg total) by mouth 2 (two) times daily. 60 tablet 3   apixaban  (ELIQUIS ) 5 MG TABS tablet Take 1 tablet (5 mg total) by mouth 2 (two) times daily. 42 tablet 0   apixaban  (ELIQUIS ) 5 MG TABS tablet Take 1 tablet (5 mg total) by mouth 2 (two) times daily. 28 tablet 0   Fluticasone -Umeclidin-Vilant (TRELEGY ELLIPTA ) 100-62.5-25 MCG/ACT AEPB Inhale 1 each into the lungs daily.     folic acid  (FOLVITE ) 1 MG tablet Take 1 tablet (1 mg total) by mouth daily. Start 7 days before pemetrexed  chemotherapy. Continue until 21 days after pemetrexed  completed. 100 tablet 3   lidocaine -prilocaine  (EMLA ) cream APPLY TO THE PORT-A-CATH SITE 30 MINUTES BEFORE TREATMENT. 30 g 0   meclizine  (ANTIVERT ) 25 MG tablet Take 1 tablet (25 mg total) by mouth 3 (three) times  daily as needed for dizziness. 14 tablet 0   metoprolol  succinate (TOPROL -XL) 50 MG 24 hr tablet TAKE 1 TABLET BY MOUTH EVERY DAY WITH OR IMMEDIATELY FOLLOWING A MEAL 90 tablet 3   Multiple Vitamin (MULTIVITAMIN) tablet Take 1 tablet by mouth daily.       ondansetron  (ZOFRAN ) 8 MG tablet Take 1 tablet (8 mg total) by mouth every 8 (eight) hours as needed for nausea or vomiting. 30 tablet 0   oxyCODONE  (OXY IR/ROXICODONE ) 5 MG immediate release tablet Take 1 tablet (5 mg total) by mouth every 6 (six) hours as needed for moderate pain. 60 tablet 0    pantoprazole  (PROTONIX ) 40 MG tablet Take 1 tablet (40 mg total) by mouth daily before breakfast. 30 tablet 1   Polyethyl Glycol-Propyl Glycol (SYSTANE OP) Place 1 drop into both eyes daily as needed (dry eye).     prochlorperazine  (COMPAZINE ) 10 MG tablet Take 1 tablet (10 mg total) by mouth every 6 (six) hours as needed for nausea or vomiting. 30 tablet 1   sacubitril-valsartan (ENTRESTO ) 49-51 MG TAKE 1 TABLET BY MOUTH TWICE A DAY 60 tablet 11   spironolactone  (ALDACTONE ) 25 MG tablet Take 0.5 tablets (12.5 mg total) by mouth daily. Take half a tablet once daily. 45 tablet 3   No current facility-administered medications for this visit.   Facility-Administered Medications Ordered in Other Visits  Medication Dose Route Frequency Provider Last Rate Last Admin   heparin  lock flush 100 unit/mL  500 Units Intracatheter Once Sherrod Sherrod, MD       sodium chloride  flush (NS) 0.9 % injection 10 mL  10 mL Intracatheter Once Sherrod Sherrod, MD        SURGICAL HISTORY:  Past Surgical History:  Procedure Laterality Date   ABDOMINAL HYSTERECTOMY     BILATERAL OOPHORECTOMY     BIOPSY  12/09/2022   Procedure: BIOPSY;  Surgeon: Federico Rosario BROCKS, MD;  Location: [facility] ENDOSCOPY;  Service: Gastroenterology;;   BRONCHIAL BIOPSY  01/09/2023   Procedure: BRONCHIAL BIOPSIES;  Surgeon: Brenna Adine CROME, DO;  Location: [facility] ENDOSCOPY;  Service: Pulmonary;;   BRONCHIAL NEEDLE ASPIRATION BIOPSY  01/09/2023   Procedure: BRONCHIAL NEEDLE ASPIRATION BIOPSIES;  Surgeon: Brenna Adine CROME, DO;  Location: [facility] ENDOSCOPY;  Service: Pulmonary;;   CATARACT EXTRACTION W/PHACO  12/27/2010   Procedure: CATARACT EXTRACTION PHACO AND INTRAOCULAR LENS PLACEMENT (IOC);  Surgeon: Cherene Mania;  Location: [facility] ORS;  Service: Ophthalmology;  Laterality: Right;   CATARACT EXTRACTION W/PHACO  03/28/2011   Procedure: CATARACT EXTRACTION PHACO AND INTRAOCULAR LENS PLACEMENT (IOC);  Surgeon: Cherene Mania;  Location: [facility] ORS;  Service: Ophthalmology;   Laterality: Left;  CDE 7.27   ENTEROSCOPY N/A 02/28/2023   Procedure: ENTEROSCOPY;  Surgeon: Avram Lupita BRAVO, MD;  Location: [facility] ENDOSCOPY;  Service: Gastroenterology;  Laterality: N/A;   ESOPHAGOGASTRODUODENOSCOPY (EGD) WITH PROPOFOL  N/A 12/09/2022   Procedure: ESOPHAGOGASTRODUODENOSCOPY (EGD) WITH PROPOFOL ;  Surgeon: Federico Rosario BROCKS, MD;  Location: [facility] ENDOSCOPY;  Service: Gastroenterology;  Laterality: N/A;   HOT HEMOSTASIS N/A 02/28/2023   Procedure: HOT HEMOSTASIS (ARGON PLASMA COAGULATION/BICAP);  Surgeon: Avram Lupita BRAVO, MD;  Location: [facility];  Service: Gastroenterology;  Laterality: N/A;   IR IMAGING GUIDED PORT INSERTION  03/23/2023   SUBMUCOSAL TATTOO INJECTION  02/28/2023   Procedure: SUBMUCOSAL TATTOO INJECTION;  Surgeon: Avram Lupita BRAVO, MD;  Location: [facility] ENDOSCOPY;  Service: Gastroenterology;;    REVIEW OF SYSTEMS:   Constitutional: Negative for appetite change, chills, fatigue, fever and unexpected weight change.  HENT: Negative  for mouth sores, nosebleeds, sore throat and trouble swallowing.   Eyes: Negative for eye problems and icterus.  Respiratory: Negative for significant cough, hemoptysis, shortness of breath and wheezing.   Cardiovascular: Negative for chest pain. Mild ankle swelling bilaterally.  Gastrointestinal: Positive for mild occasional constipation. Negative for abdominal pain, diarrhea, nausea and vomiting.  Genitourinary: Negative for bladder incontinence, difficulty urinating, dysuria, frequency and hematuria.   Musculoskeletal: Negative for back pain, gait problem, neck pain and neck stiffness.  Skin: Negative for itching and rash.  Neurological: Negative for dizziness, extremity weakness, gait problem, headaches, light-headedness and seizures.  Hematological: Negative for adenopathy. Does not bruise/bleed easily.  Psychiatric/Behavioral: Negative for confusion, depression and sleep disturbance. The patient is not nervous/anxious.   PHYSICAL EXAMINATION:   Blood pressure (!) 146/65, pulse 62, temperature 97.8 F (36.6 C), temperature source Temporal, resp. rate 14, weight 143 lb 12.8 oz (65.2 kg), SpO2 100%.  ECOG PERFORMANCE STATUS: 1  Physical Exam  Constitutional: Oriented to person, place, and time and thin appearing female and, and in no distress. HENT:  Head: Normocephalic and atraumatic.  Mouth/Throat: Oropharynx is clear and moist. No oropharyngeal exudate.  Eyes: Conjunctivae are normal. Right eye exhibits no discharge. Left eye exhibits no discharge. No scleral icterus.  Neck: Normal range of motion. Neck supple.  Cardiovascular: Normal rate, regular rhythm, normal heart sounds and intact distal pulses.   Pulmonary/Chest: Effort normal and breath sounds normal. No respiratory distress. No wheezes. No rales.  Abdominal: Soft. Bowel sounds are normal. Exhibits no distension and no mass. There is no tenderness.  Musculoskeletal: Normal range of motion. Exhibits no edema.  Lymphadenopathy:    No cervical adenopathy.  Neurological: Alert and oriented to person, place, and time. Exhibits muscle wasting. Skin: Skin is warm and dry. No rash noted. Not diaphoretic. No erythema. No pallor.  Psychiatric: Mood, memory and judgment normal.  Vitals reviewed.  LABORATORY DATA: Lab Results  Component Value Date   WBC 5.8 03/07/2024   HGB 10.6 (L) 03/07/2024   HCT 31.7 (L) 03/07/2024   MCV 93.8 03/07/2024   PLT 323 03/07/2024      Chemistry      Component Value Date/Time   NA 138 02/15/2024 1313   NA 137 09/27/2023 1224   K 4.1 02/15/2024 1313   CL 105 02/15/2024 1313   CO2 30 02/15/2024 1313   BUN 26 (H) 02/15/2024 1313   BUN 26 09/27/2023 1224   CREATININE 1.40 (H) 02/15/2024 1313      Component Value Date/Time   CALCIUM 8.8 (L) 02/15/2024 1313   ALKPHOS 116 02/15/2024 1313   AST 26 02/15/2024 1313   ALT 16 02/15/2024 1313   BILITOT 0.2 02/15/2024 1313       RADIOGRAPHIC STUDIES:  CT CHEST ABDOMEN PELVIS W  CONTRAST Result Date: 02/14/2024 CLINICAL DATA:  Non-small cell lung cancer, staging. Left lung post chemo and radiation on oral chemotherapy. * Tracking Code: BO * EXAM: CT CHEST, ABDOMEN, AND PELVIS WITH CONTRAST TECHNIQUE: Multidetector CT imaging of the chest, abdomen and pelvis was performed following the standard protocol during bolus administration of intravenous contrast. RADIATION DOSE REDUCTION: This exam was performed according to the departmental dose-optimization program which includes automated exposure control, adjustment of the mA and/or kV according to patient size and/or use of iterative reconstruction technique. CONTRAST:  OMNIPAQUE  IOHEXOL  300 MG/ML  SOLN COMPARISON:  Multiple priors including CT December 12, 2023 FINDINGS: CT CHEST FINDINGS Cardiovascular: Right chest Port-A-Cath with tip near the superior  cavoatrial junction. Aortic atherosclerosis. Coronary artery calcifications. Normal size heart. Trace pericardial effusion. Mediastinum/Nodes: No suspicious thyroid  nodule. No pathologically enlarged mediastinal, hilar or axillary lymph nodes. Gas fluid levels in a patulous esophagus. Lungs/Pleura: Spiculated left upper lobe pulmonary nodule measures 13 x 10 mm on image 41/4 previously 14 x 11 mm. There is progressive coarse interstitial thickening with bronchiectasis/bronchiolectasis and architectural distortion in the left upper lobe and a lesser extent the superior segment of the left lower lobe favored posttreatment change. Similar mucoid impaction of a left upper lobe bronchus along the inferior margin of the treatment bed on image 49/4. Stable irregular anterior left lower lobe pulmonary nodule measuring 7 mm on image 74/4. Stable irregular anterior left lower lobe pulmonary nodule measuring 6 mm on image 86/4. Similar scarring/atelectasis in the right middle lobe and dependent bilateral lower lobes. Musculoskeletal: No aggressive lytic or blastic lesion of bone. Advanced degenerative  change of the bilateral shoulders. Thoracic spondylosis. CT ABDOMEN PELVIS FINDINGS Hepatobiliary: No suspicious hepatic lesion. Gallbladder is nondistended. No biliary ductal dilation. Pancreas: No splenomegaly or focal splenic lesion. Spleen: No splenomegaly or focal splenic lesion. Adrenals/Urinary Tract: Right adrenal mass measures 5.4 X 3.1 cm on image 53/2 previously 5.8 x 3.1 cm. No suspicious left adrenal nodule/mass. Similar fluid/soft tissue along the right side of the kidney on image 59/2 extending from the right adrenal mass. No hydronephrosis. Slight hypoenhancement of the upper pole right kidney. Right renal cysts. Urinary bladder is unremarkable for degree of distension. Stomach/Bowel: Wall thickening of the gastric antrum on image 65/2. no pathologic dilation of large or small bowel. Vascular/Lymphatic: Aortic atherosclerosis. No pathologically enlarged abdominal or pelvic lymph nodes. Reproductive: Status post hysterectomy. No adnexal masses. Other: No significant abdominopelvic free fluid. Musculoskeletal: No aggressive lytic or blastic lesion of bone. Multilevel degenerative changes spine. IMPRESSION: 1. Stable spiculated left upper lobe pulmonary nodule with progressive coarse interstitial thickening with bronchiectasis/bronchiolectasis and architectural distortion in the left upper lobe and a lesser extent the superior segment of the left lower lobe favored to reflect posttreatment change. 2. Stable irregular left lower lobe pulmonary nodules. 3. Slight interval decrease in size of the right adrenal mass. 4. Slight hypoenhancement of the upper pole right kidney favored posttreatment change. Consider correlation with urinalysis to exclude infection. 5. Wall thickening of the gastric antrum, which may reflect gastritis. 6. Gas fluid levels in a patulous esophagus, suggestive of gastroesophageal reflux. Electronically Signed   By: Reyes Holder M.D.   On: 02/14/2024 15:55     ASSESSMENT/PLAN:   This is a very pleasant 73 year old female with stage IV (T1c, N0, M1) Non-Small Cell Lung Cancer, adenocarcinoma. She presented with a spiculated left upper lobe lung nodule and large right adrenal gland mass. She was diagnosed in July 2024. She had molecular studies that showed she has KRASG12C which can be used in the second line setting.  PDL1 1%, she has STK 11.  Dr. Sherrod does not feel that the patient is not a good candidate for the 4 drug combination and her performance status.   She underwent radiation to the adrenal lesion and the lung under the care of Dr. Dewey. The last dose was on 02/20/23   She is currently on palliative chemotherapy with carboplatin  for an AUC of 5, Alimta  500 mg/m2 and immunotherapy with Keytruda  200 mg IV every 3 weeks. First dose on 04/05/23 which she tolerated well except for neutropenia for which she receives zarxio  as needed. She is status post 27  cycles.  Starting from cycle #5, she started maintenance Alimta  and Keytruda . Her dose of alimta  was reduced due to her creatinine. This intermittently needs to be held.   Labs were reviewed.  I reviewed her labs, specifically her creatinine 1.25 and GFR with Dr. Sherrod.   She is ok to treat today with GFR of 46. Recommend that she proceed with cycle #17 today as scheduled with both alimta  and keytruda .    She will continue her iron  supplement and blood thinner.    Will see her back for labs and follow-up visit in 3 weeks for evaluation repeat blood work before undergoing cycle #18   She was advised to hydrate well at home.   The patient was advised to call immediately if she has any concerning symptoms in the interval. The patient voices understanding of current disease status and treatment options and is in agreement with the current care plan. All questions were answered. The patient knows to call the clinic with any problems, questions or concerns. We can certainly see the patient much sooner if  necessary   No orders of the defined types were placed in this encounter.     The total time spent in the appointment was 20-29 minutes  Erin Paullin L Faria Casella, [name] 03/07/24

## 2024-03-07 ENCOUNTER — Other Ambulatory Visit

## 2024-03-07 ENCOUNTER — Inpatient Hospital Stay: Attending: Physician Assistant

## 2024-03-07 ENCOUNTER — Ambulatory Visit: Admitting: Physician Assistant

## 2024-03-07 ENCOUNTER — Inpatient Hospital Stay: Admitting: Physician Assistant

## 2024-03-07 ENCOUNTER — Ambulatory Visit

## 2024-03-07 ENCOUNTER — Encounter: Payer: Self-pay | Admitting: Internal Medicine

## 2024-03-07 ENCOUNTER — Inpatient Hospital Stay

## 2024-03-07 VITALS — BP 126/73 | HR 65 | Temp 98.6°F | Resp 16

## 2024-03-07 VITALS — BP 146/65 | HR 62 | Temp 97.8°F | Resp 14 | Wt 143.8 lb

## 2024-03-07 DIAGNOSIS — C7971 Secondary malignant neoplasm of right adrenal gland: Secondary | ICD-10-CM | POA: Insufficient documentation

## 2024-03-07 DIAGNOSIS — Z79620 Long term (current) use of immunosuppressive biologic: Secondary | ICD-10-CM | POA: Insufficient documentation

## 2024-03-07 DIAGNOSIS — C3412 Malignant neoplasm of upper lobe, left bronchus or lung: Secondary | ICD-10-CM

## 2024-03-07 DIAGNOSIS — C3492 Malignant neoplasm of unspecified part of left bronchus or lung: Secondary | ICD-10-CM | POA: Diagnosis not present

## 2024-03-07 DIAGNOSIS — Z79631 Long term (current) use of antimetabolite agent: Secondary | ICD-10-CM | POA: Diagnosis not present

## 2024-03-07 DIAGNOSIS — Z5112 Encounter for antineoplastic immunotherapy: Secondary | ICD-10-CM

## 2024-03-07 DIAGNOSIS — Z5111 Encounter for antineoplastic chemotherapy: Secondary | ICD-10-CM

## 2024-03-07 LAB — CBC WITH DIFFERENTIAL (CANCER CENTER ONLY)
Abs Immature Granulocytes: 0.04 K/uL (ref 0.00–0.07)
Basophils Absolute: 0 K/uL (ref 0.0–0.1)
Basophils Relative: 1 %
Eosinophils Absolute: 0.1 K/uL (ref 0.0–0.5)
Eosinophils Relative: 1 %
HCT: 31.7 % — ABNORMAL LOW (ref 36.0–46.0)
Hemoglobin: 10.6 g/dL — ABNORMAL LOW (ref 12.0–15.0)
Immature Granulocytes: 1 %
Lymphocytes Relative: 32 %
Lymphs Abs: 1.8 K/uL (ref 0.7–4.0)
MCH: 31.4 pg (ref 26.0–34.0)
MCHC: 33.4 g/dL (ref 30.0–36.0)
MCV: 93.8 fL (ref 80.0–100.0)
Monocytes Absolute: 1.1 K/uL — ABNORMAL HIGH (ref 0.1–1.0)
Monocytes Relative: 18 %
Neutro Abs: 2.7 K/uL (ref 1.7–7.7)
Neutrophils Relative %: 47 %
Platelet Count: 323 K/uL (ref 150–400)
RBC: 3.38 MIL/uL — ABNORMAL LOW (ref 3.87–5.11)
RDW: 17.7 % — ABNORMAL HIGH (ref 11.5–15.5)
WBC Count: 5.8 K/uL (ref 4.0–10.5)
nRBC: 0.3 % — ABNORMAL HIGH (ref 0.0–0.2)

## 2024-03-07 LAB — CMP (CANCER CENTER ONLY)
ALT: 16 U/L (ref 0–44)
AST: 26 U/L (ref 15–41)
Albumin: 3.1 g/dL — ABNORMAL LOW (ref 3.5–5.0)
Alkaline Phosphatase: 109 U/L (ref 38–126)
Anion gap: 1 — ABNORMAL LOW (ref 5–15)
BUN: 21 mg/dL (ref 8–23)
CO2: 31 mmol/L (ref 22–32)
Calcium: 9.6 mg/dL (ref 8.9–10.3)
Chloride: 106 mmol/L (ref 98–111)
Creatinine: 1.25 mg/dL — ABNORMAL HIGH (ref 0.44–1.00)
GFR, Estimated: 46 mL/min — ABNORMAL LOW (ref >60)
Glucose, Bld: 66 mg/dL — ABNORMAL LOW (ref 70–99)
Potassium: 4.2 mmol/L (ref 3.5–5.1)
Sodium: 138 mmol/L (ref 135–145)
Total Bilirubin: 0.3 mg/dL (ref 0.0–1.2)
Total Protein: 7.9 g/dL (ref 6.5–8.1)

## 2024-03-07 MED ORDER — SODIUM CHLORIDE 0.9 % IV SOLN: Freq: Once | INTRAVENOUS | Status: AC

## 2024-03-07 MED ORDER — SODIUM CHLORIDE 0.9 % IV SOLN
200.0000 mg | Freq: Once | INTRAVENOUS | Status: AC
  Administered 2024-03-07: 200 mg via INTRAVENOUS
  Filled 2024-03-07: qty 200

## 2024-03-07 MED ORDER — PROCHLORPERAZINE MALEATE 10 MG PO TABS
10.0000 mg | ORAL_TABLET | Freq: Once | ORAL | Status: AC
  Administered 2024-03-07: 10 mg via ORAL
  Filled 2024-03-07: qty 1

## 2024-03-07 MED ORDER — SODIUM CHLORIDE 0.9 % IV SOLN
400.0000 mg/m2 | Freq: Once | INTRAVENOUS | Status: AC
  Administered 2024-03-07: 600 mg via INTRAVENOUS
  Filled 2024-03-07: qty 20

## 2024-03-07 NOTE — Patient Instructions (Signed)
 [facility] - A DEPT OF [facility]  Discharge Instructions: Thank you for choosing [facility] to provide your oncology and hematology care.   If you have a lab appointment with the Cancer Center, please go directly to the Cancer Center and check in at the registration area.   Wear comfortable clothing and clothing appropriate for easy access to any Portacath or PICC line.   We strive to give you quality time with your provider. You may need to reschedule your appointment if you arrive late (15 or more minutes).  Arriving late affects you and other patients whose appointments are after yours.  Also, if you miss three or more appointments without notifying the office, you may be dismissed from the clinic at the provider's discretion.      For prescription refill requests, have your pharmacy contact our office and allow 72 hours for refills to be completed.    Today you received the following chemotherapy and/or immunotherapy agents: Keytruda, Alimta.       To help prevent nausea and vomiting after your treatment, we encourage you to take your nausea medication as directed.  BELOW ARE SYMPTOMS THAT SHOULD BE REPORTED IMMEDIATELY: *FEVER GREATER THAN 100.4 F (38 C) OR HIGHER *CHILLS OR SWEATING *NAUSEA AND VOMITING THAT IS NOT CONTROLLED WITH YOUR NAUSEA MEDICATION *UNUSUAL SHORTNESS OF BREATH *UNUSUAL BRUISING OR BLEEDING *URINARY PROBLEMS (pain or burning when urinating, or frequent urination) *BOWEL PROBLEMS (unusual diarrhea, constipation, pain near the anus) TENDERNESS IN MOUTH AND THROAT WITH OR WITHOUT PRESENCE OF ULCERS (sore throat, sores in mouth, or a toothache) UNUSUAL RASH, SWELLING OR PAIN  UNUSUAL VAGINAL DISCHARGE OR ITCHING   Items with * indicate a potential emergency and should be followed up as soon as possible or go to the Emergency Department if any problems should occur.  Please show the CHEMOTHERAPY ALERT CARD or  IMMUNOTHERAPY ALERT CARD at check-in to the Emergency Department and triage nurse.  Should you have questions after your visit or need to cancel or reschedule your appointment, please contact [facility] - A DEPT OF [facility]  Dept: [phone]  and follow the prompts.  Office hours are 8:00 a.m. to 4:30 p.m. Monday - Friday. Please note that voicemails left after 4:00 p.m. may not be returned until the following business day.  We are closed weekends and major holidays. You have access to a nurse at all times for urgent questions. Please call the main number to the clinic Dept: [phone] and follow the prompts.   For any non-urgent questions, you may also contact your provider using MyChart. We now offer e-Visits for anyone 61 and older to request care online for non-urgent symptoms. For details visit [email or web address].   Also download the MyChart app! Go to the app store, search "MyChart", open the app, select , and log in with your MyChart username and password.

## 2024-03-14 ENCOUNTER — Other Ambulatory Visit: Payer: Self-pay

## 2024-03-20 ENCOUNTER — Other Ambulatory Visit: Payer: Self-pay | Admitting: Internal Medicine

## 2024-03-28 ENCOUNTER — Inpatient Hospital Stay

## 2024-03-28 ENCOUNTER — Inpatient Hospital Stay: Admitting: Internal Medicine

## 2024-03-28 VITALS — BP 126/74 | HR 65 | Temp 98.0°F | Resp 17 | Ht 63.0 in | Wt 142.0 lb

## 2024-03-28 VITALS — BP 131/64 | HR 68 | Temp 97.7°F | Resp 16

## 2024-03-28 DIAGNOSIS — C3412 Malignant neoplasm of upper lobe, left bronchus or lung: Secondary | ICD-10-CM

## 2024-03-28 DIAGNOSIS — Z5112 Encounter for antineoplastic immunotherapy: Secondary | ICD-10-CM | POA: Diagnosis not present

## 2024-03-28 DIAGNOSIS — C3490 Malignant neoplasm of unspecified part of unspecified bronchus or lung: Secondary | ICD-10-CM

## 2024-03-28 LAB — CMP (CANCER CENTER ONLY)
ALT: 15 U/L (ref 0–44)
AST: 28 U/L (ref 15–41)
Albumin: 3.2 g/dL — ABNORMAL LOW (ref 3.5–5.0)
Alkaline Phosphatase: 113 U/L (ref 38–126)
Anion gap: 4 — ABNORMAL LOW (ref 5–15)
BUN: 18 mg/dL (ref 8–23)
CO2: 30 mmol/L (ref 22–32)
Calcium: 9.1 mg/dL (ref 8.9–10.3)
Chloride: 105 mmol/L (ref 98–111)
Creatinine: 1.26 mg/dL — ABNORMAL HIGH (ref 0.44–1.00)
GFR, Estimated: 45 mL/min — ABNORMAL LOW (ref >60)
Glucose, Bld: 90 mg/dL (ref 70–99)
Potassium: 4.1 mmol/L (ref 3.5–5.1)
Sodium: 139 mmol/L (ref 135–145)
Total Bilirubin: 0.3 mg/dL (ref 0.0–1.2)
Total Protein: 7.7 g/dL (ref 6.5–8.1)

## 2024-03-28 LAB — CBC WITH DIFFERENTIAL (CANCER CENTER ONLY)
Abs Immature Granulocytes: 0.03 K/uL (ref 0.00–0.07)
Basophils Absolute: 0 K/uL (ref 0.0–0.1)
Basophils Relative: 1 %
Eosinophils Absolute: 0.1 K/uL (ref 0.0–0.5)
Eosinophils Relative: 1 %
HCT: 31.6 % — ABNORMAL LOW (ref 36.0–46.0)
Hemoglobin: 10.7 g/dL — ABNORMAL LOW (ref 12.0–15.0)
Immature Granulocytes: 1 %
Lymphocytes Relative: 36 %
Lymphs Abs: 1.9 K/uL (ref 0.7–4.0)
MCH: 31.9 pg (ref 26.0–34.0)
MCHC: 33.9 g/dL (ref 30.0–36.0)
MCV: 94.3 fL (ref 80.0–100.0)
Monocytes Absolute: 0.8 K/uL (ref 0.1–1.0)
Monocytes Relative: 16 %
Neutro Abs: 2.5 K/uL (ref 1.7–7.7)
Neutrophils Relative %: 45 %
Platelet Count: 324 K/uL (ref 150–400)
RBC: 3.35 MIL/uL — ABNORMAL LOW (ref 3.87–5.11)
RDW: 18.2 % — ABNORMAL HIGH (ref 11.5–15.5)
WBC Count: 5.3 K/uL (ref 4.0–10.5)
nRBC: 0 % (ref 0.0–0.2)

## 2024-03-28 LAB — TSH: TSH: 0.985 u[IU]/mL (ref 0.350–4.500)

## 2024-03-28 MED ORDER — SODIUM CHLORIDE 0.9 % IV SOLN: Freq: Once | INTRAVENOUS | Status: AC

## 2024-03-28 MED ORDER — PROCHLORPERAZINE MALEATE 10 MG PO TABS
10.0000 mg | ORAL_TABLET | Freq: Once | ORAL | Status: AC
  Administered 2024-03-28: 10 mg via ORAL
  Filled 2024-03-28: qty 1

## 2024-03-28 MED ORDER — SODIUM CHLORIDE 0.9 % IV SOLN
200.0000 mg | Freq: Once | INTRAVENOUS | Status: AC
  Administered 2024-03-28: 200 mg via INTRAVENOUS
  Filled 2024-03-28: qty 200

## 2024-03-28 MED ORDER — CYANOCOBALAMIN 1000 MCG/ML IJ SOLN
1000.0000 ug | Freq: Once | INTRAMUSCULAR | Status: AC
  Administered 2024-03-28: 1000 ug via INTRAMUSCULAR
  Filled 2024-03-28: qty 1

## 2024-03-28 MED ORDER — SODIUM CHLORIDE 0.9% FLUSH
10.0000 mL | INTRAVENOUS | Status: DC | PRN
  Administered 2024-03-28: 10 mL

## 2024-03-28 MED ORDER — SODIUM CHLORIDE 0.9 % IV SOLN
400.0000 mg/m2 | Freq: Once | INTRAVENOUS | Status: AC
  Administered 2024-03-28: 600 mg via INTRAVENOUS
  Filled 2024-03-28: qty 20

## 2024-03-28 NOTE — Patient Instructions (Signed)
 [facility] - A DEPT OF [facility]  Discharge Instructions: Thank you for choosing [facility] to provide your oncology and hematology care.   If you have a lab appointment with the Cancer Center, please go directly to the Cancer Center and check in at the registration area.   Wear comfortable clothing and clothing appropriate for easy access to any Portacath or PICC line.   We strive to give you quality time with your provider. You may need to reschedule your appointment if you arrive late (15 or more minutes).  Arriving late affects you and other patients whose appointments are after yours.  Also, if you miss three or more appointments without notifying the office, you may be dismissed from the clinic at the provider's discretion.      For prescription refill requests, have your pharmacy contact our office and allow 72 hours for refills to be completed.    Today you received the following chemotherapy and/or immunotherapy agents: Keytruda, Alimta.       To help prevent nausea and vomiting after your treatment, we encourage you to take your nausea medication as directed.  BELOW ARE SYMPTOMS THAT SHOULD BE REPORTED IMMEDIATELY: *FEVER GREATER THAN 100.4 F (38 C) OR HIGHER *CHILLS OR SWEATING *NAUSEA AND VOMITING THAT IS NOT CONTROLLED WITH YOUR NAUSEA MEDICATION *UNUSUAL SHORTNESS OF BREATH *UNUSUAL BRUISING OR BLEEDING *URINARY PROBLEMS (pain or burning when urinating, or frequent urination) *BOWEL PROBLEMS (unusual diarrhea, constipation, pain near the anus) TENDERNESS IN MOUTH AND THROAT WITH OR WITHOUT PRESENCE OF ULCERS (sore throat, sores in mouth, or a toothache) UNUSUAL RASH, SWELLING OR PAIN  UNUSUAL VAGINAL DISCHARGE OR ITCHING   Items with * indicate a potential emergency and should be followed up as soon as possible or go to the Emergency Department if any problems should occur.  Please show the CHEMOTHERAPY ALERT CARD or  IMMUNOTHERAPY ALERT CARD at check-in to the Emergency Department and triage nurse.  Should you have questions after your visit or need to cancel or reschedule your appointment, please contact [facility] - A DEPT OF [facility]  Dept: [phone]  and follow the prompts.  Office hours are 8:00 a.m. to 4:30 p.m. Monday - Friday. Please note that voicemails left after 4:00 p.m. may not be returned until the following business day.  We are closed weekends and major holidays. You have access to a nurse at all times for urgent questions. Please call the main number to the clinic Dept: [phone] and follow the prompts.   For any non-urgent questions, you may also contact your provider using MyChart. We now offer e-Visits for anyone 61 and older to request care online for non-urgent symptoms. For details visit [email or web address].   Also download the MyChart app! Go to the app store, search "MyChart", open the app, select , and log in with your MyChart username and password.

## 2024-03-28 NOTE — Progress Notes (Signed)
 [facility] [phone]   Fax:(336) [phone]  OFFICE PROGRESS NOTE  [name], [name] [address] [place] [address]  DIAGNOSIS:  Stage IV (T1c, N0, M1b) Non-Small Cell Lung Cancer, adenocarcinoma. She presented with a spiculated left upper lobe lung nodule and large right adrenal gland mass. She was diagnosed in July 2024. She had molecular studies that showed she has KRASG12C which can be used in the second line setting.    PDL1: 1%   PRIOR THERAPY: SBRT to the left upper lobe and right adrenal targets under the care of Dr. Dewey.  Last dose of treatment expected on 02/20/2023   CURRENT THERAPY: Systemic chemotherapy with carboplatin  for AUC of 5, Alimta  500 Mg/M2 and Keytruda  200 Mg IV every 3 weeks.  First dose April 05, 2023.Starting cycle #5 she has been on maintenance treatment with Alimta  and Keytruda  every 3 weeks.  Status post 17 cycles  INTERVAL HISTORY: Erin Pacheco 73 y.o. female returns to the clinic today for follow-up visit accompanied by her daughter. Discussed the use of AI scribe software for clinical note transcription with the patient, who gave verbal consent to proceed.  History of Present Illness Erin Pacheco is a 73 year old female with stage four non-small cell lung cancer who presents for cycle 18 of her treatment. She is accompanied by her daughter.  She was diagnosed with stage four non-small cell lung cancer in July 2024, characterized by a KRAS G12C mutation and PD-L1 expression of 1%.  She is currently on cycle 18 of her treatment regimen. No significant side effects such as nausea, vomiting, diarrhea, chest pain, or breathing issues are reported, although she occasionally experiences a queasy stomach, which she attributes to constipation. This symptom is managed with medication as needed.  Her last imaging study was conducted on February 08, 2024.    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  MEDICATIONS:  Current Outpatient Medications  Medication Sig Dispense Refill   acetaminophen  (TYLENOL ) 325 MG tablet Take 2 tablets (650 mg total) by mouth every 6 (six) hours as needed for mild pain (or Fever >/= 101).     apixaban  (ELIQUIS ) 5 MG TABS tablet Take 1 tablet (5 mg total) by mouth 2 (two) times daily. 60 tablet 3   apixaban  (ELIQUIS ) 5 MG TABS tablet Take 1 tablet (5 mg total) by mouth 2 (two) times daily. 42 tablet 0   apixaban  (ELIQUIS ) 5 MG TABS tablet Take 1 tablet (5 mg total) by mouth 2 (two) times daily. 28 tablet 0   Fluticasone -Umeclidin-Vilant (TRELEGY ELLIPTA ) 100-62.5-25 MCG/ACT AEPB Inhale 1 each into the lungs daily.     folic acid  (FOLVITE ) 1 MG tablet Take 1 tablet (1 mg total) by mouth daily. Start 7 days before pemetrexed  chemotherapy. Continue until 21 days after pemetrexed  completed. 100 tablet 3   lidocaine -prilocaine  (EMLA ) cream APPLY TO THE PORT-A-CATH SITE 30 MINUTES BEFORE TREATMENT. 30 g 1   meclizine  (ANTIVERT ) 25 MG tablet Take 1 tablet (25 mg total) by mouth 3 (three) times daily as needed for dizziness. 14 tablet 0   metoprolol  succinate (TOPROL -XL) 50  MG 24 hr tablet TAKE 1 TABLET BY MOUTH EVERY DAY WITH OR IMMEDIATELY FOLLOWING A MEAL 90 tablet 3   Multiple Vitamin (MULTIVITAMIN) tablet Take 1 tablet by mouth daily.       ondansetron  (ZOFRAN ) 8 MG tablet Take 1 tablet (8 mg total) by mouth every 8 (eight) hours as needed for nausea or vomiting. 30 tablet 0   oxyCODONE  (OXY IR/ROXICODONE ) 5 MG immediate release tablet Take 1 tablet (5 mg total) by mouth every 6 (six) hours as needed for moderate pain. 60 tablet 0   pantoprazole  (PROTONIX ) 40 MG tablet Take 1 tablet (40  mg total) by mouth daily before breakfast. 30 tablet 1   Polyethyl Glycol-Propyl Glycol (SYSTANE OP) Place 1 drop into both eyes daily as needed (dry eye).     prochlorperazine  (COMPAZINE ) 10 MG tablet Take 1 tablet (10 mg total) by mouth every 6 (six) hours as needed for nausea or vomiting. 30 tablet 1   sacubitril-valsartan (ENTRESTO ) 49-51 MG TAKE 1 TABLET BY MOUTH TWICE A DAY 60 tablet 11   spironolactone  (ALDACTONE ) 25 MG tablet Take 0.5 tablets (12.5 mg total) by mouth daily. Take half a tablet once daily. 45 tablet 3   No current facility-administered medications for this visit.   Facility-Administered Medications Ordered in Other Visits  Medication Dose Route Frequency Provider Last Rate Last Admin   heparin  lock flush 100 unit/mL  500 Units Intracatheter Once Sherrod Sherrod, MD       sodium chloride  flush (NS) 0.9 % injection 10 mL  10 mL Intracatheter Once Sherrod Sherrod, MD        SURGICAL HISTORY:  Past Surgical History:  Procedure Laterality Date   ABDOMINAL HYSTERECTOMY     BILATERAL OOPHORECTOMY     BIOPSY  12/09/2022   Procedure: BIOPSY;  Surgeon: Federico Rosario BROCKS, MD;  Location[facility] ENDOSCOPY;  Service: Gastroenterology;;   BRONCHIAL BIOPSY  01/09/2023   Procedure: BRONCHIAL BIOPSIES;  Surgeon: Brenna Adine CROME, DO;  Location: [facility] ENDOSCOPY;  Service: Pulmonary;;   BRONCHIAL NEEDLE ASPIRATION BIOPSY  01/09/2023   Procedure: BRONCHIAL NEEDLE ASPIRATION BIOPSIES;  Surgeon: Brenna Adine CROME, DO;  Location: [facility] ENDOSCOPY;  Service: Pulmonary;;   CATARACT EXTRACTION W/PHACO  12/27/2010   Procedure: CATARACT EXTRACTION PHACO AND INTRAOCULAR LENS PLACEMENT (IOC);  Surgeon: Cherene Mania;  Location: [facility] ORS;  Service: Ophthalmology;  Laterality: Right;   CATARACT EXTRACTION W/PHACO  03/28/2011   Procedure: CATARACT EXTRACTION PHACO AND INTRAOCULAR LENS PLACEMENT (IOC);  Surgeon: Cherene Mania;  Location: [facility] ORS;  Service: Ophthalmology;  Laterality: Left;  CDE 7.27   ENTEROSCOPY N/A 02/28/2023    Procedure: ENTEROSCOPY;  Surgeon: Avram Lupita BRAVO, MD;  Location: [facility] ENDOSCOPY;  Service: Gastroenterology;  Laterality: N/A;   ESOPHAGOGASTRODUODENOSCOPY (EGD) WITH PROPOFOL  N/A 12/09/2022   Procedure: ESOPHAGOGASTRODUODENOSCOPY (EGD) WITH PROPOFOL ;  Surgeon: Federico Rosario BROCKS, MD;  Location: [facility] ENDOSCOPY;  Service: Gastroenterology;  Laterality: N/A;   HOT HEMOSTASIS N/A 02/28/2023   Procedure: HOT HEMOSTASIS (ARGON PLASMA COAGULATION/BICAP);  Surgeon: Avram Lupita BRAVO, MD;  Location: [facility]COPY;  Service: Gastroenterology;  Laterality: N/A;   IR IMAGING GUIDED PORT INSERTION  03/23/2023   SUBMUCOSAL TATTOO INJECTION  02/28/2023   Procedure: SUBMUCOSAL TATTOO INJECTION;  Surgeon: Avram Lupita BRAVO, MD;  Location: [facility] ENDOSCOPY;  Service: Gastroenterology;;    REVIEW OF SYSTEMS:  A comprehensive review of systems was negative except for: Gastrointestinal: positive for constipation   PHYSICAL EXAMINATION: General appearance: alert, cooperative, and no distress Head: Normocephalic, without obvious abnormality, atraumatic  Neck: no adenopathy, no JVD, supple, symmetrical, trachea midline, and thyroid  not enlarged, symmetric, no tenderness/mass/nodules Lymph nodes: Cervical, supraclavicular, and axillary nodes normal. Resp: clear to auscultation bilaterally Back: symmetric, no curvature. ROM normal. No CVA tenderness. Cardio: regular rate and rhythm, S1, S2 normal, no murmur, click, rub or gallop GI: soft, non-tender; bowel sounds normal; no masses,  no organomegaly Extremities: extremities normal, atraumatic, no cyanosis or edema  ECOG PERFORMANCE STATUS: 1 - Symptomatic but completely ambulatory  Blood pressure 126/74, pulse 65, temperature 98 F (36.7 C), temperature source Temporal, resp. rate 17, height 5' 3 (1.6 m), weight 142 lb (64.4 kg), SpO2 99%.  LABORATORY DATA: Lab Results  Component Value Date   WBC 5.8 03/07/2024   HGB 10.6 (L) 03/07/2024   HCT 31.7 (L) 03/07/2024   MCV 93.8  03/07/2024   PLT 323 03/07/2024      Chemistry      Component Value Date/Time   NA 138 03/07/2024 1426   NA 137 09/27/2023 1224   K 4.2 03/07/2024 1426   CL 106 03/07/2024 1426   CO2 31 03/07/2024 1426   BUN 21 03/07/2024 1426   BUN 26 09/27/2023 1224   CREATININE 1.25 (H) 03/07/2024 1426      Component Value Date/Time   CALCIUM 9.6 03/07/2024 1426   ALKPHOS 109 03/07/2024 1426   AST 26 03/07/2024 1426   ALT 16 03/07/2024 1426   BILITOT 0.3 03/07/2024 1426       RADIOGRAPHIC STUDIES: No results found.     ASSESSMENT AND PLAN: This is a very pleasant 73 years old African-American female recently diagnosed with a stage IV (T1c, N0, M1b) non-small cell lung cancer, adenocarcinoma with positive KRAS G12C mutation and PD-L1 expression of 1%.  She started systemic chemotherapy with carboplatin  for AUC of 5, Alimta  500 Mg/M2 and Keytruda  200 Mg IV every 3 weeks.  She is status post 17 cycle of her treatment.  Her current dose of Alimta  is 400 mg/M2.  This was reduced secondary to renal insufficiency. She has been tolerating this treatment fairly well except for mild fatigue and occasional constipation. Assessment and Plan Assessment & Plan Stage IV non-small cell lung cancer with KRAS G12C mutation Stage IV non-small cell lung cancer with KRAS G12C mutation and PD-L1 expression of 1%, diagnosed in July 2024. Currently undergoing treatment, with cycle number 18 completed. Previous scan on September 4th showed well-managed disease. No significant side effects reported, except for occasional queasiness related to constipation. - Order scan in two weeks - Schedule follow-up appointment in three weeks  Constipation Intermittent constipation causing occasional queasiness. Symptoms managed with over-the-counter remedies as needed. The patient was advised to call immediately if she has any concerning symptoms in the interval. The patient voices understanding of current disease status and  treatment options and is in agreement with the current care plan.  All questions were answered. The patient knows to call the clinic with any problems, questions or concerns. We can certainly see the patient much sooner if necessary. The total time spent in the appointment was 20 minutes.  Disclaimer: This note was dictated with voice recognition software. Similar sounding words can inadvertently be transcribed and may not be corrected upon review.

## 2024-03-29 LAB — T4: T4, Total: 8.8 ug/dL (ref 4.5–12.0)

## 2024-04-02 ENCOUNTER — Other Ambulatory Visit: Payer: Self-pay

## 2024-04-08 ENCOUNTER — Other Ambulatory Visit: Payer: Self-pay

## 2024-04-09 ENCOUNTER — Other Ambulatory Visit: Payer: Self-pay

## 2024-04-11 ENCOUNTER — Other Ambulatory Visit (HOSPITAL_COMMUNITY)

## 2024-04-17 ENCOUNTER — Ambulatory Visit (HOSPITAL_COMMUNITY)
Admission: RE | Admit: 2024-04-17 | Discharge: 2024-04-17 | Disposition: A | Discharge status: Home / Self Care | Source: Ambulatory Visit | Attending: Internal Medicine | Admitting: Internal Medicine

## 2024-04-17 DIAGNOSIS — J439 Emphysema, unspecified: Secondary | ICD-10-CM | POA: Diagnosis not present

## 2024-04-17 DIAGNOSIS — C3490 Malignant neoplasm of unspecified part of unspecified bronchus or lung: Secondary | ICD-10-CM | POA: Insufficient documentation

## 2024-04-17 MED ORDER — IOPAMIDOL (ISOVUE-300) INJECTION 61%: 75.0000 mL | Freq: Once | INTRAVENOUS | Status: DC | PRN

## 2024-04-17 MED ORDER — IOHEXOL 300 MG/ML  SOLN
75.0000 mL | Freq: Once | INTRAMUSCULAR | Status: AC | PRN
  Administered 2024-04-17: 75 mL via INTRAVENOUS

## 2024-04-18 ENCOUNTER — Inpatient Hospital Stay: Admitting: Internal Medicine

## 2024-04-18 ENCOUNTER — Other Ambulatory Visit: Payer: Self-pay | Admitting: *Deleted

## 2024-04-18 ENCOUNTER — Inpatient Hospital Stay

## 2024-04-18 ENCOUNTER — Inpatient Hospital Stay: Attending: Physician Assistant

## 2024-04-18 VITALS — BP 128/62

## 2024-04-18 VITALS — BP 92/52 | HR 66 | Temp 97.8°F | Resp 17 | Ht 63.0 in | Wt 142.7 lb

## 2024-04-18 DIAGNOSIS — C7971 Secondary malignant neoplasm of right adrenal gland: Secondary | ICD-10-CM | POA: Insufficient documentation

## 2024-04-18 DIAGNOSIS — I959 Hypotension, unspecified: Secondary | ICD-10-CM | POA: Diagnosis not present

## 2024-04-18 DIAGNOSIS — C3412 Malignant neoplasm of upper lobe, left bronchus or lung: Secondary | ICD-10-CM | POA: Insufficient documentation

## 2024-04-18 DIAGNOSIS — Z5111 Encounter for antineoplastic chemotherapy: Secondary | ICD-10-CM | POA: Diagnosis present

## 2024-04-18 DIAGNOSIS — C3492 Malignant neoplasm of unspecified part of left bronchus or lung: Secondary | ICD-10-CM

## 2024-04-18 DIAGNOSIS — Z79620 Long term (current) use of immunosuppressive biologic: Secondary | ICD-10-CM | POA: Insufficient documentation

## 2024-04-18 DIAGNOSIS — Z5112 Encounter for antineoplastic immunotherapy: Secondary | ICD-10-CM | POA: Insufficient documentation

## 2024-04-18 DIAGNOSIS — Z79631 Long term (current) use of antimetabolite agent: Secondary | ICD-10-CM | POA: Diagnosis not present

## 2024-04-18 LAB — CBC WITH DIFFERENTIAL (CANCER CENTER ONLY)
Abs Immature Granulocytes: 0.05 K/uL (ref 0.00–0.07)
Basophils Absolute: 0.1 K/uL (ref 0.0–0.1)
Basophils Relative: 1 %
Eosinophils Absolute: 0.1 K/uL (ref 0.0–0.5)
Eosinophils Relative: 2 %
HCT: 32.1 % — ABNORMAL LOW (ref 36.0–46.0)
Hemoglobin: 10.7 g/dL — ABNORMAL LOW (ref 12.0–15.0)
Immature Granulocytes: 1 %
Lymphocytes Relative: 32 %
Lymphs Abs: 1.7 K/uL (ref 0.7–4.0)
MCH: 32.1 pg (ref 26.0–34.0)
MCHC: 33.3 g/dL (ref 30.0–36.0)
MCV: 96.4 fL (ref 80.0–100.0)
Monocytes Absolute: 0.8 K/uL (ref 0.1–1.0)
Monocytes Relative: 15 %
Neutro Abs: 2.6 K/uL (ref 1.7–7.7)
Neutrophils Relative %: 49 %
Platelet Count: 332 K/uL (ref 150–400)
RBC: 3.33 MIL/uL — ABNORMAL LOW (ref 3.87–5.11)
RDW: 18.3 % — ABNORMAL HIGH (ref 11.5–15.5)
WBC Count: 5.3 K/uL (ref 4.0–10.5)
nRBC: 0.4 % — ABNORMAL HIGH (ref 0.0–0.2)

## 2024-04-18 LAB — CMP (CANCER CENTER ONLY)
ALT: 14 U/L (ref 0–44)
AST: 26 U/L (ref 15–41)
Albumin: 3.3 g/dL — ABNORMAL LOW (ref 3.5–5.0)
Alkaline Phosphatase: 110 U/L (ref 38–126)
Anion gap: 5 (ref 5–15)
BUN: 24 mg/dL — ABNORMAL HIGH (ref 8–23)
CO2: 29 mmol/L (ref 22–32)
Calcium: 9.4 mg/dL (ref 8.9–10.3)
Chloride: 103 mmol/L (ref 98–111)
Creatinine: 1.42 mg/dL — ABNORMAL HIGH (ref 0.44–1.00)
GFR, Estimated: 39 mL/min — ABNORMAL LOW (ref >60)
Glucose, Bld: 118 mg/dL — ABNORMAL HIGH (ref 70–99)
Potassium: 4 mmol/L (ref 3.5–5.1)
Sodium: 137 mmol/L (ref 135–145)
Total Bilirubin: 0.4 mg/dL (ref 0.0–1.2)
Total Protein: 7.9 g/dL (ref 6.5–8.1)

## 2024-04-18 MED ORDER — PROCHLORPERAZINE MALEATE 10 MG PO TABS
10.0000 mg | ORAL_TABLET | Freq: Once | ORAL | Status: AC
  Administered 2024-04-18: 10 mg via ORAL
  Filled 2024-04-18: qty 1

## 2024-04-18 MED ORDER — SODIUM CHLORIDE 0.9 % IV SOLN
400.0000 mg/m2 | Freq: Once | INTRAVENOUS | Status: AC
  Administered 2024-04-18: 600 mg via INTRAVENOUS
  Filled 2024-04-18: qty 24

## 2024-04-18 MED ORDER — SODIUM CHLORIDE 0.9 % IV SOLN
200.0000 mg | Freq: Once | INTRAVENOUS | Status: AC
  Administered 2024-04-18: 200 mg via INTRAVENOUS
  Filled 2024-04-18: qty 200

## 2024-04-18 MED ORDER — SODIUM CHLORIDE 0.9 % IV SOLN: Freq: Once | INTRAVENOUS | Status: AC

## 2024-04-18 MED ORDER — SODIUM CHLORIDE 0.9 % IV SOLN: INTRAVENOUS | Status: AC

## 2024-04-18 NOTE — Patient Instructions (Signed)
 [facility] - A DEPT OF [facility]  Discharge Instructions: Thank you for choosing [facility] to provide your oncology and hematology care.   If you have a lab appointment with the Cancer Center, please go directly to the Cancer Center and check in at the registration area.   Wear comfortable clothing and clothing appropriate for easy access to any Portacath or PICC line.   We strive to give you quality time with your provider. You may need to reschedule your appointment if you arrive late (15 or more minutes).  Arriving late affects you and other patients whose appointments are after yours.  Also, if you miss three or more appointments without notifying the office, you may be dismissed from the clinic at the provider's discretion.      For prescription refill requests, have your pharmacy contact our office and allow 72 hours for refills to be completed.    Today you received the following chemotherapy and/or immunotherapy agents: Keytruda, Alimta.       To help prevent nausea and vomiting after your treatment, we encourage you to take your nausea medication as directed.  BELOW ARE SYMPTOMS THAT SHOULD BE REPORTED IMMEDIATELY: *FEVER GREATER THAN 100.4 F (38 C) OR HIGHER *CHILLS OR SWEATING *NAUSEA AND VOMITING THAT IS NOT CONTROLLED WITH YOUR NAUSEA MEDICATION *UNUSUAL SHORTNESS OF BREATH *UNUSUAL BRUISING OR BLEEDING *URINARY PROBLEMS (pain or burning when urinating, or frequent urination) *BOWEL PROBLEMS (unusual diarrhea, constipation, pain near the anus) TENDERNESS IN MOUTH AND THROAT WITH OR WITHOUT PRESENCE OF ULCERS (sore throat, sores in mouth, or a toothache) UNUSUAL RASH, SWELLING OR PAIN  UNUSUAL VAGINAL DISCHARGE OR ITCHING   Items with * indicate a potential emergency and should be followed up as soon as possible or go to the Emergency Department if any problems should occur.  Please show the CHEMOTHERAPY ALERT CARD or  IMMUNOTHERAPY ALERT CARD at check-in to the Emergency Department and triage nurse.  Should you have questions after your visit or need to cancel or reschedule your appointment, please contact [facility] - A DEPT OF [facility]  Dept: [phone]  and follow the prompts.  Office hours are 8:00 a.m. to 4:30 p.m. Monday - Friday. Please note that voicemails left after 4:00 p.m. may not be returned until the following business day.  We are closed weekends and major holidays. You have access to a nurse at all times for urgent questions. Please call the main number to the clinic Dept: [phone] and follow the prompts.   For any non-urgent questions, you may also contact your provider using MyChart. We now offer e-Visits for anyone 61 and older to request care online for non-urgent symptoms. For details visit [email or web address].   Also download the MyChart app! Go to the app store, search "MyChart", open the app, select , and log in with your MyChart username and password.

## 2024-04-18 NOTE — Progress Notes (Signed)
 [facility] Telephone:(336) [phone]   Fax:(336) [phone]  OFFICE PROGRESS NOTE  [name], [name] [address] [place] [address]  DIAGNOSIS:  Stage IV (T1c, N0, M1b) Non-Small Cell Lung Cancer, adenocarcinoma. She presented with a spiculated left upper lobe lung nodule and large right adrenal gland mass. She was diagnosed in July 2024. She had molecular studies that showed she has KRASG12C which can be used in the second line setting.    PDL1: 1%   PRIOR THERAPY: SBRT to the left upper lobe and right adrenal targets under the care of Dr. Dewey.  Last dose of treatment expected on 02/20/2023   CURRENT THERAPY: Systemic chemotherapy with carboplatin  for AUC of 5, Alimta  500 Mg/M2 and Keytruda  200 Mg IV every 3 weeks.  First dose April 05, 2023.Starting cycle #5 she has been on maintenance treatment with Alimta  and Keytruda  every 3 weeks.  Status post 18 cycles  INTERVAL HISTORY: Erin Pacheco 73 y.o. female returns to the clinic today for follow-up visit accompanied by her daughter. Discussed the use of AI scribe software for clinical note transcription with the patient, who gave verbal consent to proceed.  History of Present Illness Erin Pacheco is a 73 year old female with stage four non-small cell lung cancer who presents for restaging and continuation of treatment. She is accompanied by her daughter.  She was diagnosed with stage four non-small cell lung cancer, adenocarcinoma, in July 2024, with a positive KRAS G12C mutation and PD-L1 expression of one percent. She is post-stereotactic body radiation therapy (SBRT) to the left upper lobe lung nodule.  She feels 'all right' and has been doing well with her treatment so far.  She mentions low blood pressure, which was noted today. She takes one blood pressure medication daily, as part of her six-pill morning regimen. She has not been monitoring her blood pressure at home. She stopped  taking her blood pressure medication a couple of days ago due to low readings.  No chest pain, breathing issues, nausea, or vomiting, except for one instance of vomiting.    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  MEDICATIONS:  Current Outpatient Medications  Medication Sig Dispense Refill   acetaminophen  (TYLENOL ) 325 MG tablet Take 2 tablets (650 mg total) by mouth every 6 (six) hours as needed for mild pain (or Fever >/= 101).     apixaban  (ELIQUIS ) 5 MG TABS tablet Take 1 tablet (5 mg total) by mouth 2 (two) times daily. 60 tablet 3   apixaban  (ELIQUIS ) 5 MG TABS tablet Take 1 tablet (5 mg total) by mouth 2 (two) times daily. 42 tablet 0   apixaban  (ELIQUIS ) 5 MG TABS tablet Take 1 tablet (5 mg total) by mouth 2 (two) times daily. 28 tablet 0   Fluticasone -Umeclidin-Vilant (TRELEGY ELLIPTA ) 100-62.5-25 MCG/ACT AEPB Inhale 1 each into the lungs daily.     folic acid  (FOLVITE ) 1 MG tablet Take 1 tablet (1 mg total) by mouth daily. Start 7 days before pemetrexed  chemotherapy. Continue until 21 days after pemetrexed  completed. 100 tablet 3   lidocaine -prilocaine  (EMLA ) cream APPLY TO THE PORT-A-CATH SITE 30  MINUTES BEFORE TREATMENT. 30 g 1   meclizine  (ANTIVERT ) 25 MG tablet Take 1 tablet (25 mg total) by mouth 3 (three) times daily as needed for dizziness. 14 tablet 0   metoprolol  succinate (TOPROL -XL) 50 MG 24 hr tablet TAKE 1 TABLET BY MOUTH EVERY DAY WITH OR IMMEDIATELY FOLLOWING A MEAL 90 tablet 3   Multiple Vitamin (MULTIVITAMIN) tablet Take 1 tablet by mouth daily.       ondansetron  (ZOFRAN ) 8 MG tablet Take 1 tablet (8 mg total) by mouth every 8 (eight) hours as needed for nausea or vomiting. 30 tablet  0   oxyCODONE  (OXY IR/ROXICODONE ) 5 MG immediate release tablet Take 1 tablet (5 mg total) by mouth every 6 (six) hours as needed for moderate pain. 60 tablet 0   pantoprazole  (PROTONIX ) 40 MG tablet Take 1 tablet (40 mg total) by mouth daily before breakfast. 30 tablet 1   Polyethyl Glycol-Propyl Glycol (SYSTANE OP) Place 1 drop into both eyes daily as needed (dry eye).     prochlorperazine  (COMPAZINE ) 10 MG tablet Take 1 tablet (10 mg total) by mouth every 6 (six) hours as needed for nausea or vomiting. 30 tablet 1   sacubitril-valsartan (ENTRESTO ) 49-51 MG TAKE 1 TABLET BY MOUTH TWICE A DAY 60 tablet 11   spironolactone  (ALDACTONE ) 25 MG tablet Take 0.5 tablets (12.5 mg total) by mouth daily. Take half a tablet once daily. 45 tablet 3   No current facility-administered medications for this visit.   Facility-Administered Medications Ordered in Other Visits  Medication Dose Route Frequency Provider Last Rate Last Admin   heparin  lock flush 100 unit/mL  500 Units Intracatheter Once Sherrod Sherrod, MD       sodium chloride  flush (NS) 0.9 % injection 10 mL  10 mL Intracatheter Once Sherrod Sherrod, MD        SURGICAL HISTORY:  Past Surgical History:  Procedure Laterality Date   ABDOMINAL HYSTERECTOMY     BILATERAL OOPHORECTOMY     BIOPSY  12/09/2022   Procedure: BIOPSY;  Surgeon: Federico Rosario BROCKS, MD;  Location: [facility] ENDOSCOPY;  Service: Gastroenterology;;   BRONCHIAL BIOPSY  01/09/2023   Procedure: BRONCHIAL BIOPSIES;  Surgeon: Brenna Adine CROME, DO;  Location: [facility] ENDOSCOPY;  Service: Pulmonary;;   BRONCHIAL NEEDLE ASPIRATION BIOPSY  01/09/2023   Procedure: BRONCHIAL NEEDLE ASPIRATION BIOPSIES;  Surgeon: Brenna Adine CROME, DO;  Location: [facility] ENDOSCOPY;  Service: Pulmonary;;   CATARACT EXTRACTION W/PHACO  12/27/2010   Procedure: CATARACT EXTRACTION PHACO AND INTRAOCULAR LENS PLACEMENT (IOC);  Surgeon: Cherene Mania;  Location: [facility] ORS;  Service: Ophthalmology;  Laterality: Right;   CATARACT EXTRACTION  W/PHACO  03/28/2011   Procedure: CATARACT EXTRACTION PHACO AND INTRAOCULAR LENS PLACEMENT (IOC);  Surgeon: Cherene Mania;  Location: [facility] ORS;  Service: Ophthalmology;  Laterality: Left;  CDE 7.27   ENTEROSCOPY N/A 02/28/2023   Procedure: ENTEROSCOPY;  Surgeon: Avram Lupita BRAVO, MD;  Location: [facility] ENDOSCOPY;  Service: Gastroenterology;  Laterality: N/A;   ESOPHAGOGASTRODUODENOSCOPY (EGD) WITH PROPOFOL  N/A 12/09/2022   Procedure: ESOPHAGOGASTRODUODENOSCOPY (EGD) WITH PROPOFOL ;  Surgeon: Federico Rosario BROCKS, MD;  Location[facility] ENDOSCOPY;  Service: Gastroenterology;  Laterality: N/A;   HOT HEMOSTASIS N/A 02/28/2023   Procedure: HOT HEMOSTASIS (ARGON PLASMA COAGULATION/BICAP);  Surgeon: Avram Lupita BRAVO, MD;  Location: [facility]COPY;  Service: Gastroenterology;  Laterality: N/A;   IR IMAGING GUIDED PORT INSERTION  03/23/2023   SUBMUCOSAL TATTOO INJECTION  02/28/2023   Procedure: SUBMUCOSAL TATTOO INJECTION;  Surgeon: Avram Lupita BRAVO, MD;  Location: [facility] ENDOSCOPY;  Service:  Gastroenterology;;    REVIEW OF SYSTEMS:  Constitutional: positive for fatigue Eyes: negative Ears, nose, mouth, throat, and face: negative Respiratory: negative Cardiovascular: negative Gastrointestinal: negative Genitourinary:negative Integument/breast: negative Hematologic/lymphatic: negative Musculoskeletal:negative Neurological: negative Behavioral/Psych: negative Endocrine: negative Allergic/Immunologic: negative   PHYSICAL EXAMINATION: General appearance: alert, cooperative, fatigued, and no distress Head: Normocephalic, without obvious abnormality, atraumatic Neck: no adenopathy, no JVD, supple, symmetrical, trachea midline, and thyroid  not enlarged, symmetric, no tenderness/mass/nodules Lymph nodes: Cervical, supraclavicular, and axillary nodes normal. Resp: clear to auscultation bilaterally Back: symmetric, no curvature. ROM normal. No CVA tenderness. Cardio: regular rate and rhythm, S1, S2 normal, no murmur, click, rub or  gallop GI: soft, non-tender; bowel sounds normal; no masses,  no organomegaly Extremities: extremities normal, atraumatic, no cyanosis or edema Neurologic: Alert and oriented X 3, normal strength and tone. Normal symmetric reflexes. Normal coordination and gait  ECOG PERFORMANCE STATUS: 1 - Symptomatic but completely ambulatory  Blood pressure (!) 92/52, pulse 66, temperature 97.8 F (36.6 C), temperature source Temporal, resp. rate 17, height 5' 3 (1.6 m), weight 142 lb 11.2 oz (64.7 kg), SpO2 99%.  LABORATORY DATA: Lab Results  Component Value Date   WBC 5.3 04/18/2024   HGB 10.7 (L) 04/18/2024   HCT 32.1 (L) 04/18/2024   MCV 96.4 04/18/2024   PLT 332 04/18/2024      Chemistry      Component Value Date/Time   NA 139 03/28/2024 1343   NA 137 09/27/2023 1224   K 4.1 03/28/2024 1343   CL 105 03/28/2024 1343   CO2 30 03/28/2024 1343   BUN 18 03/28/2024 1343   BUN 26 09/27/2023 1224   CREATININE 1.26 (H) 03/28/2024 1343      Component Value Date/Time   CALCIUM 9.1 03/28/2024 1343   ALKPHOS 113 03/28/2024 1343   AST 28 03/28/2024 1343   ALT 15 03/28/2024 1343   BILITOT 0.3 03/28/2024 1343       RADIOGRAPHIC STUDIES: No results found.     ASSESSMENT AND PLAN: This is a very pleasant 73 years old African-American female recently diagnosed with a stage IV (T1c, N0, M1b) non-small cell lung cancer, adenocarcinoma with positive KRAS G12C mutation and PD-L1 expression of 1%.  She started systemic chemotherapy with carboplatin  for AUC of 5, Alimta  500 Mg/M2 and Keytruda  200 Mg IV every 3 weeks.  She is status post 18 cycle of her treatment.  Her current dose of Alimta  is 400 mg/M2.  This was reduced secondary to renal insufficiency. She has been tolerating this treatment fairly well except for mild fatigue and occasional constipation. She had repeat CT scan of the chest, abdomen and pelvis performed recently.  I personally independently reviewed the scan images and discussed  the result with the patient and her daughter.  Her scan report is still pending but I personally and independently reviewed the images and I did not see any significant evidence for disease progression.  I would wait for the final report for confirmation. Assessment and Plan Assessment & Plan Stage IV non-small cell lung cancer of the left upper lobe on active treatment Stage IV non-small cell lung cancer, adenocarcinoma, diagnosed in July 2024 with positive KRAS G12C mutation and PD-L1 expression of 1%. Currently on cycle 19 of treatment. Recent CT scan of the chest, abdomen, and pelvis shows no significant changes, indicating stable disease. - Proceeded with cycle 19 of treatment - Administered half a liter of IV fluids to manage hypotension during treatment  Hypotension Recent episode of hypotension.  Currently on antihypertensive medication, but advised to monitor blood pressure at home. If blood pressure is low, hold medication and reassess later in the day. - Monitor blood pressure at home - Hold antihypertensive medication if blood pressure is low - Consult primary care physician regarding blood pressure management She was advised to call immediately if she has any other concerning symptoms in the interval.  The patient voices understanding of current disease status and treatment options and is in agreement with the current care plan.  All questions were answered. The patient knows to call the clinic with any problems, questions or concerns. We can certainly see the patient much sooner if necessary. The total time spent in the appointment was 30 minutes.  Disclaimer: This note was dictated with voice recognition software. Similar sounding words can inadvertently be transcribed and may not be corrected upon review.

## 2024-04-26 ENCOUNTER — Other Ambulatory Visit: Payer: Self-pay

## 2024-04-30 ENCOUNTER — Other Ambulatory Visit: Payer: Self-pay | Admitting: Internal Medicine

## 2024-04-30 DIAGNOSIS — C3412 Malignant neoplasm of upper lobe, left bronchus or lung: Secondary | ICD-10-CM

## 2024-05-08 NOTE — Assessment & Plan Note (Addendum)
 Stage IV (T1c, N0, M1b) Non-Small Cell Lung Cancer, adenocarcinoma. She presented with a spiculated left upper lobe lung nodule and large right adrenal gland mass. She was diagnosed in July 2024. She had molecular studies that showed she has KRASG12C which can be used in the second line setting.    PDL1: 1%   PRIOR THERAPY: SBRT to the left upper lobe and right adrenal targets under the care of Dr. Dewey.  Last dose of treatment expected on 02/20/2023   CURRENT THERAPY: Systemic chemotherapy with carboplatin  for AUC of 5, Alimta  500 Mg/M2 and Keytruda  200 Mg IV every 3 weeks.  First dose April 05, 2023.Starting cycle #5 she has been on maintenance treatment with Alimta  and Keytruda  every 3 weeks.  Status post 19 cycles in total.

## 2024-05-08 NOTE — Progress Notes (Unsigned)
 Patient Care Team: Emilio Joesph VEAR DEVONNA as PCP - General (Physician Assistant) Lavona Agent, MD as PCP - Cardiology (Cardiology)  Clinic Day:  05/09/2024  Referring physician: Sherrod Sherrod, MD  ASSESSMENT & PLAN:   Assessment & Plan: Non-small cell cancer of left lung (HCC)  Stage IV (T1c, N0, M1b) Non-Small Cell Lung Cancer, adenocarcinoma. She presented with a spiculated left upper lobe lung nodule and large right adrenal gland mass. She was diagnosed in July 2024. She had molecular studies that showed she has KRASG12C which can be used in the second line setting.    PDL1: 1%   PRIOR THERAPY: SBRT to the left upper lobe and right adrenal targets under the care of Dr. Dewey.  Last dose of treatment expected on 02/20/2023   CURRENT THERAPY: Systemic chemotherapy with carboplatin  for AUC of 5, Alimta  500 Mg/M2 and Keytruda  200 Mg IV every 3 weeks.  First dose April 05, 2023.Starting cycle #5 she has been on maintenance treatment with Alimta  and Keytruda  every 3 weeks.  Status post 19 cycles in total.   Anemia Mild and stable.  Hgb 10.0 and HCT 29.8.  Continue to monitor with every visit.  CKD Stable renal functions with BUN 26, creatinine 1.37, and eGFR 41.  Monitored by primary care.  Plan Labs reviewed. -Mild and stable anemia with Hgb 10.0 and HCT 29.8. - Stable CKD with BUN 26, creatinine 1.37, and eGFR 41.  Remainder of CMP unremarkable. Labs and patient presentation are appropriate for treatment today. -Proceed with pemetrexed  and pembrolizumab  as scheduled. Labs with follow-up and subsequent treatment as scheduled.   The patient understands the plans discussed today and is in agreement with them.  She knows to contact our office if she develops concerns prior to her next appointment.  I provided 25 minutes of face-to-face time during this encounter and > 50% was spent counseling as documented under my assessment and plan.    Powell FORBES Lessen, NP  [place]  [facility] - A DEPT OF [facility]. [facility] [address] [place] [address] Dept: [phone] Dept Fax: [phone]   No orders of the defined types were placed in this encounter.     CHIEF COMPLAINT:  CC: Non-small cell cancer of left lung  Current Treatment: Maintenance Alimta  and Keytruda  every 3 weeks  INTERVAL HISTORY:  Gerldine is here today for repeat clinical assessment.  She last saw Dr. Sherrod on 04/18/2024.  She reports doing well overall.  She denies excess fatigue.  No problems with her skin.  She does have mild peripheral neuropathy.  This is mostly in hands.  Very manageable.  She denies chest pain, chest pressure, or shortness of breath. She denies headaches or visual disturbances. She denies abdominal pain, nausea, vomiting, or changes in bowel or bladder habits.  She denies fevers or chills. She denies pain. Her appetite is good. Her weight has been stable.  I have reviewed the past medical history, past surgical history, social history and family history with the patient and they are unchanged from previous note.  ALLERGIES:  is allergic to chlorhexidine .  MEDICATIONS:  Current Outpatient Medications  Medication Sig Dispense Refill   acetaminophen  (TYLENOL ) 325 MG tablet Take 2 tablets (650 mg total) by mouth every 6 (six) hours as needed for mild pain (or Fever >/= 101).     apixaban  (ELIQUIS ) 5 MG TABS tablet Take 1 tablet (5 mg total) by mouth 2 (two) times  daily. 60 tablet 3   apixaban  (ELIQUIS ) 5 MG TABS tablet Take 1 tablet (5 mg total) by mouth 2 (two) times daily. 42 tablet 0   apixaban  (ELIQUIS ) 5 MG TABS tablet Take 1 tablet (5 mg total) by mouth 2 (two) times daily. 28 tablet 0   Fluticasone -Umeclidin-Vilant (TRELEGY ELLIPTA ) 100-62.5-25 MCG/ACT AEPB Inhale 1 each into the lungs daily.     folic acid  (FOLVITE ) 1 MG tablet TAKE 1 TAB BY MOUTH DAILY. START 7 DAYS BEFORE PEMETREXED  CHEMOTHERAPY. CONTINUE  UNTIL 21 DAYS AFTER PEMETREXED  COMPLETED. 90 tablet 4   lidocaine -prilocaine  (EMLA ) cream APPLY TO THE PORT-A-CATH SITE 30 MINUTES BEFORE TREATMENT. 30 g 1   meclizine  (ANTIVERT ) 25 MG tablet Take 1 tablet (25 mg total) by mouth 3 (three) times daily as needed for dizziness. 14 tablet 0   metoprolol  succinate (TOPROL -XL) 50 MG 24 hr tablet TAKE 1 TABLET BY MOUTH EVERY DAY WITH OR IMMEDIATELY FOLLOWING A MEAL 90 tablet 3   Multiple Vitamin (MULTIVITAMIN) tablet Take 1 tablet by mouth daily.       ondansetron  (ZOFRAN ) 8 MG tablet Take 1 tablet (8 mg total) by mouth every 8 (eight) hours as needed for nausea or vomiting. 30 tablet 0   oxyCODONE  (OXY IR/ROXICODONE ) 5 MG immediate release tablet Take 1 tablet (5 mg total) by mouth every 6 (six) hours as needed for moderate pain. 60 tablet 0   pantoprazole  (PROTONIX ) 40 MG tablet Take 1 tablet (40 mg total) by mouth daily before breakfast. 30 tablet 1   Polyethyl Glycol-Propyl Glycol (SYSTANE OP) Place 1 drop into both eyes daily as needed (dry eye).     prochlorperazine  (COMPAZINE ) 10 MG tablet Take 1 tablet (10 mg total) by mouth every 6 (six) hours as needed for nausea or vomiting. 30 tablet 1   sacubitril-valsartan (ENTRESTO ) 49-51 MG TAKE 1 TABLET BY MOUTH TWICE A DAY 60 tablet 11   spironolactone  (ALDACTONE ) 25 MG tablet Take 0.5 tablets (12.5 mg total) by mouth daily. Take half a tablet once daily. 45 tablet 3   No current facility-administered medications for this visit.   Facility-Administered Medications Ordered in Other Visits  Medication Dose Route Frequency Provider Last Rate Last Admin   0.9 %  sodium chloride  infusion   Intravenous Once Sherrod Sherrod, MD       heparin  lock flush 100 unit/mL  500 Units Intracatheter Once Sherrod Sherrod, MD       sodium chloride  flush (NS) 0.9 % injection 10 mL  10 mL Intracatheter Once Sherrod Sherrod, MD        HISTORY OF PRESENT ILLNESS:   Oncology History  Malignant neoplasm of upper lobe of  left lung (HCC)  12/09/2022 Initial Diagnosis   Malignant neoplasm of upper lobe of left lung (HCC)   04/05/2023 -  Chemotherapy   Patient is on Treatment Plan : LUNG Carboplatin  (5) + Pemetrexed  (500) + Pembrolizumab  (200) D1 q21d Induction x 4 cycles / Maintenance Pemetrexed  (500) + Pembrolizumab  (200) D1 q21d     Non-small cell cancer of left lung (HCC)  01/30/2023 Initial Diagnosis   Non-small cell cancer of left lung (HCC)   04/26/2023 Cancer Staging   Staging form: Lung, AJCC 8th Edition - Clinical: Stage IVA (cT1c, cN0, cM1b) - Signed by Sherrod Sherrod, MD on 04/26/2023       REVIEW OF SYSTEMS:   Constitutional: Denies fevers, chills or abnormal weight loss Eyes: Denies blurriness of vision Ears, nose, mouth, throat, and face: Denies mucositis or  sore throat Respiratory: Denies cough, dyspnea or wheezes Cardiovascular: Denies palpitation, chest discomfort or lower extremity swelling Gastrointestinal:  Denies nausea, heartburn or change in bowel habits Skin: Denies abnormal skin rashes Lymphatics: Denies new lymphadenopathy or easy bruising Neurological:Denies numbness, tingling or new weaknesses Behavioral/Psych: Mood is stable, no new changes  All other systems were reviewed with the patient and are negative.   VITALS:   Today's Vitals   05/09/24 1321 05/09/24 1322  BP: (!) 116/58   Pulse: 60   Resp: 17   Temp: 98 F (36.7 C)   SpO2: 98%   Weight: 144 lb 14.4 oz (65.7 kg)   PainSc:  0-No pain   Body mass index is 25.67 kg/m.   Wt Readings from Last 3 Encounters:  05/09/24 144 lb 14.4 oz (65.7 kg)  04/18/24 142 lb 11.2 oz (64.7 kg)  03/28/24 142 lb (64.4 kg)    Body mass index is 25.67 kg/m.  Performance status (ECOG): 1 - Symptomatic but completely ambulatory  PHYSICAL EXAM:   GENERAL:alert, no distress and comfortable SKIN: skin color, texture, turgor are normal, no rashes or significant lesions EYES: normal, Conjunctiva are pink and  non-injected, sclera clear OROPHARYNX:no exudate, no erythema and lips, buccal mucosa, and tongue normal  NECK: supple, thyroid  normal size, non-tender, without nodularity LYMPH:  no palpable lymphadenopathy in the cervical, axillary or inguinal LUNGS: clear to auscultation and percussion with normal breathing effort HEART: regular rate & rhythm and no murmurs and no lower extremity edema ABDOMEN:abdomen soft, non-tender and normal bowel sounds Musculoskeletal:no cyanosis of digits and no clubbing  NEURO: alert & oriented x 3 with fluent speech, no focal motor/sensory deficits  LABORATORY DATA:  I have reviewed the data as listed    Component Value Date/Time   NA 137 05/09/2024 1250   NA 137 09/27/2023 1224   K 4.3 05/09/2024 1250   CL 103 05/09/2024 1250   CO2 28 05/09/2024 1250   GLUCOSE 78 05/09/2024 1250   BUN 26 (H) 05/09/2024 1250   BUN 26 09/27/2023 1224   CREATININE 1.37 (H) 05/09/2024 1250   CALCIUM 9.4 05/09/2024 1250   PROT 8.0 05/09/2024 1250   ALBUMIN 3.4 (L) 05/09/2024 1250   AST 41 05/09/2024 1250   ALT 26 05/09/2024 1250   ALKPHOS 133 (H) 05/09/2024 1250   BILITOT 0.4 05/09/2024 1250   GFRNONAA 41 (L) 05/09/2024 1250   GFRAA 57 (L) 05/30/2012 2032    Lab Results  Component Value Date   WBC 4.9 05/09/2024   NEUTROABS 2.1 05/09/2024   HGB 10.0 (L) 05/09/2024   HCT 29.8 (L) 05/09/2024   MCV 98.3 05/09/2024   PLT 337 05/09/2024     RADIOGRAPHIC STUDIES: CT CHEST ABDOMEN PELVIS W CONTRAST Result Date: 04/18/2024 EXAM: CT CHEST, ABDOMEN AND PELVIS WITH CONTRAST 04/17/2024 12:37:04 PM TECHNIQUE: CT of the chest, abdomen and pelvis was performed with the administration of 75 mL of Omnipaque  300 intravenous contrast. Multiplanar reformatted images are provided for review. Automated exposure control, iterative reconstruction, and/or weight based adjustment of the mA/kV was utilized to reduce the radiation dose to as low as reasonably achievable. CONTRAST: 75 mL  of Omnipaque  300. COMPARISON: CT Chest Abdomen Pelvis 02/08/2024. CLINICAL HISTORY: Non-small cell lung cancer (NSCLC), staging. * Tracking Code: BO * FINDINGS: CHEST: MEDIASTINUM AND LYMPH NODES: Right port-a-cath tip is in the lower SVC. Paracardiac stable small anterior pericardial effusion. Nonspecific distal esophageal wall thickening, esophagitis would be a common cause. Heart is otherwise unremarkable. The central  airways are clear. No mediastinal, hilar or axillary lymphadenopathy. LUNGS AND PLEURA: Emphysema. Hazy reticular interstitial accentuation posteriorly in the right upper lobe new compared to the prior exam. Stable right middle lobe scarring. Stable scarring posteriorly in the right lower lobe with associated cylindrical bronchiectasis. Bandlike density in the left upper lobe some of which may be treatment related peribronchovascular or left suprahilar nodularity associated with airway plugging dominant nodularity measuring 1.4 x 1.0 cm on image 45 series 4, previously 1.5 cm. Subpleural nodularity anteriorly in the left upper lobe measuring 0.7 x 0.6 cm on image 81 series 4 stable. Stable 4 mm subsolid nodule anteriorly in the left lower lobe on image 94 series 4. Trace right pleural effusion not changed from prior. No pneumothorax. ABDOMEN AND PELVIS: LIVER: The liver is unremarkable. GALLBLADDER AND BILE DUCTS: Gallbladder is unremarkable. No biliary ductal dilatation. SPLEEN: No acute abnormality. PANCREAS: No acute abnormality. ADRENAL GLANDS: Low density right adrenal mass 4.7 x 2.9 cm on image 55 series 2 internal density 32 Hounsfield units, stable. KIDNEYS, URETERS AND BLADDER: Simple cyst of the right kidney upper pole requires no further imaging workup. Mild scarring in the left kidney. No stones in the kidneys or ureters. No hydronephrosis. No perinephric or periureteral stranding. Urinary bladder is unremarkable. GI AND BOWEL: Wall thickening in the stomach and the antrum is less  notable on today's exam. There is no bowel obstruction. REPRODUCTIVE ORGANS: Uterus and ovaries are absent. PERITONEUM AND RETROPERITONEUM: No ascites. No free air. VASCULATURE: Aorta is normal in caliber. Systemic atherosclerosis is present, including the aorta and iliac arteries. Substantial atheromatous plaque at the origin of the SMA potentially causing high grade stenosis but with enhancement of the SMA without large collateral vessels indicating lack of overt occlusion. Minimal atheromatous plaque dorsally at the origin of the celiac trunk. ABDOMINAL AND PELVIS LYMPH NODES: No lymphadenopathy. BONES AND SOFT TISSUES: Bilateral degenerative glenohumeral arthropathy. Chronic 1.3 cm free osteochondral fragment in the subscapular recess of the left glenohumeral joint. Small right groin hernia contains adipose tissue. Grade 1 degenerative anterolisthesis at L2-L3 and L3-L4 in combination with spondylosis and degenerative disc disease there is suspected impingement at L2-L3, L3-L4, L4-L5, and L5-S1. IMPRESSION: 1. Stable findings of treated non-small cell lung cancer with no evidence of new or progressive metastatic disease. 2. New hazy reticular interstitial accentuation in the right upper lobe, indeterminate for treatment change or infection/inflammation; recommend clinical and imaging follow-up as indicated. 3. Stable left suprahilar dominant nodule measuring 1.4 x 1.0 cm, previously 1.5 cm. 4. Stable subpleural nodularity in the left upper lobe measuring 0.7 x 0.6 cm. 5. Stable low-density right adrenal mass. 6. Stable 4 mm subsolid nodule in the left lower lobe. 7. Emphysema. 8. Substantial atheromatous plaque at the origin of the SMA potentially causing high-grade stenosis, with enhancement of the SMA and no imaging evidence of occlusion. The patient may be at accentuated risk for chronic mesenteric ischemia, although the celiac trunk appears patent. 9. Multilevel lumbar degenerative changes with grade 1  anterolisthesis at L2-3 and L3-4, spondylosis, and degenerative disc disease with suspected impingement at L3-4, L4-5, and L5-S1. 10. Bilateral degenerative glenohumeral arthropathy. Electronically signed by: Ryan Salvage MD 04/18/2024 03:39 PM EST RP Workstation: [id number]

## 2024-05-09 ENCOUNTER — Inpatient Hospital Stay

## 2024-05-09 ENCOUNTER — Inpatient Hospital Stay: Attending: Physician Assistant

## 2024-05-09 ENCOUNTER — Inpatient Hospital Stay: Attending: Physician Assistant | Admitting: Nurse Practitioner

## 2024-05-09 VITALS — BP 116/58 | HR 60 | Temp 98.0°F | Resp 17 | Wt 144.9 lb

## 2024-05-09 DIAGNOSIS — D649 Anemia, unspecified: Secondary | ICD-10-CM | POA: Insufficient documentation

## 2024-05-09 DIAGNOSIS — Z5112 Encounter for antineoplastic immunotherapy: Secondary | ICD-10-CM | POA: Diagnosis present

## 2024-05-09 DIAGNOSIS — Z79620 Long term (current) use of immunosuppressive biologic: Secondary | ICD-10-CM | POA: Insufficient documentation

## 2024-05-09 DIAGNOSIS — C7971 Secondary malignant neoplasm of right adrenal gland: Secondary | ICD-10-CM | POA: Diagnosis not present

## 2024-05-09 DIAGNOSIS — C3492 Malignant neoplasm of unspecified part of left bronchus or lung: Secondary | ICD-10-CM | POA: Diagnosis not present

## 2024-05-09 DIAGNOSIS — Z79631 Long term (current) use of antimetabolite agent: Secondary | ICD-10-CM | POA: Insufficient documentation

## 2024-05-09 DIAGNOSIS — Z5111 Encounter for antineoplastic chemotherapy: Secondary | ICD-10-CM | POA: Diagnosis present

## 2024-05-09 DIAGNOSIS — C3412 Malignant neoplasm of upper lobe, left bronchus or lung: Secondary | ICD-10-CM | POA: Diagnosis not present

## 2024-05-09 LAB — CBC WITH DIFFERENTIAL (CANCER CENTER ONLY)
Abs Immature Granulocytes: 0.04 K/uL (ref 0.00–0.07)
Basophils Absolute: 0 K/uL (ref 0.0–0.1)
Basophils Relative: 1 %
Eosinophils Absolute: 0.1 K/uL (ref 0.0–0.5)
Eosinophils Relative: 2 %
HCT: 29.8 % — ABNORMAL LOW (ref 36.0–46.0)
Hemoglobin: 10 g/dL — ABNORMAL LOW (ref 12.0–15.0)
Immature Granulocytes: 1 %
Lymphocytes Relative: 37 %
Lymphs Abs: 1.8 K/uL (ref 0.7–4.0)
MCH: 33 pg (ref 26.0–34.0)
MCHC: 33.6 g/dL (ref 30.0–36.0)
MCV: 98.3 fL (ref 80.0–100.0)
Monocytes Absolute: 0.8 K/uL (ref 0.1–1.0)
Monocytes Relative: 17 %
Neutro Abs: 2.1 K/uL (ref 1.7–7.7)
Neutrophils Relative %: 42 %
Platelet Count: 337 K/uL (ref 150–400)
RBC: 3.03 MIL/uL — ABNORMAL LOW (ref 3.87–5.11)
RDW: 16.8 % — ABNORMAL HIGH (ref 11.5–15.5)
WBC Count: 4.9 K/uL (ref 4.0–10.5)
nRBC: 0 % (ref 0.0–0.2)

## 2024-05-09 LAB — CMP (CANCER CENTER ONLY)
ALT: 26 U/L (ref 0–44)
AST: 41 U/L (ref 15–41)
Albumin: 3.4 g/dL — ABNORMAL LOW (ref 3.5–5.0)
Alkaline Phosphatase: 133 U/L — ABNORMAL HIGH (ref 38–126)
Anion gap: 6 (ref 5–15)
BUN: 26 mg/dL — ABNORMAL HIGH (ref 8–23)
CO2: 28 mmol/L (ref 22–32)
Calcium: 9.4 mg/dL (ref 8.9–10.3)
Chloride: 103 mmol/L (ref 98–111)
Creatinine: 1.37 mg/dL — ABNORMAL HIGH (ref 0.44–1.00)
GFR, Estimated: 41 mL/min — ABNORMAL LOW (ref >60)
Glucose, Bld: 78 mg/dL (ref 70–99)
Potassium: 4.3 mmol/L (ref 3.5–5.1)
Sodium: 137 mmol/L (ref 135–145)
Total Bilirubin: 0.4 mg/dL (ref 0.0–1.2)
Total Protein: 8 g/dL (ref 6.5–8.1)

## 2024-05-09 MED ORDER — PROCHLORPERAZINE MALEATE 10 MG PO TABS
10.0000 mg | ORAL_TABLET | Freq: Once | ORAL | Status: AC
  Administered 2024-05-09: 10 mg via ORAL
  Filled 2024-05-09: qty 1

## 2024-05-09 MED ORDER — SODIUM CHLORIDE 0.9 % IV SOLN: Freq: Once | INTRAVENOUS | Status: AC

## 2024-05-09 MED ORDER — SODIUM CHLORIDE 0.9 % IV SOLN
200.0000 mg | Freq: Once | INTRAVENOUS | Status: AC
  Administered 2024-05-09: 200 mg via INTRAVENOUS
  Filled 2024-05-09: qty 200

## 2024-05-09 MED ORDER — SODIUM CHLORIDE 0.9 % IV SOLN
400.0000 mg/m2 | Freq: Once | INTRAVENOUS | Status: AC
  Administered 2024-05-09: 600 mg via INTRAVENOUS
  Filled 2024-05-09: qty 20

## 2024-05-09 NOTE — Patient Instructions (Signed)
 [facility] - A DEPT OF [facility]  Discharge Instructions: Thank you for choosing [facility] to provide your oncology and hematology care.   If you have a lab appointment with the Cancer Center, please go directly to the Cancer Center and check in at the registration area.   Wear comfortable clothing and clothing appropriate for easy access to any Portacath or PICC line.   We strive to give you quality time with your provider. You may need to reschedule your appointment if you arrive late (15 or more minutes).  Arriving late affects you and other patients whose appointments are after yours.  Also, if you miss three or more appointments without notifying the office, you may be dismissed from the clinic at the provider's discretion.      For prescription refill requests, have your pharmacy contact our office and allow 72 hours for refills to be completed.    Today you received the following chemotherapy and/or immunotherapy agents: Pembrolizumab  (Keytruda ) & Pemetrexed  disodium (Alimta )    To help prevent nausea and vomiting after your treatment, we encourage you to take your nausea medication as directed.  BELOW ARE SYMPTOMS THAT SHOULD BE REPORTED IMMEDIATELY: *FEVER GREATER THAN 100.4 F (38 C) OR HIGHER *CHILLS OR SWEATING *NAUSEA AND VOMITING THAT IS NOT CONTROLLED WITH YOUR NAUSEA MEDICATION *UNUSUAL SHORTNESS OF BREATH *UNUSUAL BRUISING OR BLEEDING *URINARY PROBLEMS (pain or burning when urinating, or frequent urination) *BOWEL PROBLEMS (unusual diarrhea, constipation, pain near the anus) TENDERNESS IN MOUTH AND THROAT WITH OR WITHOUT PRESENCE OF ULCERS (sore throat, sores in mouth, or a toothache) UNUSUAL RASH, SWELLING OR PAIN  UNUSUAL VAGINAL DISCHARGE OR ITCHING   Items with * indicate a potential emergency and should be followed up as soon as possible or go to the Emergency Department if any problems should occur.  Please show  the CHEMOTHERAPY ALERT CARD or IMMUNOTHERAPY ALERT CARD at check-in to the Emergency Department and triage nurse.  Should you have questions after your visit or need to cancel or reschedule your appointment, please contact [facility]  Dept: [phone]  and follow the prompts.  Office hours are 8:00 a.m. to 4:30 p.m. Monday - Friday. Please note that voicemails left after 4:00 p.m. may not be returned until the following business day.  We are closed weekends and major holidays. You have access to a nurse at all times for urgent questions. Please call the main number to the clinic Dept: [phone] and follow the prompts.   For any non-urgent questions, you may also contact your provider using MyChart. We now offer e-Visits for anyone 64 and older to request care online for non-urgent symptoms. For details visit [email or web address].   Also download the MyChart app! Go to the app store, search MyChart, open the app, select [place], and log in with your MyChart username and password.

## 2024-05-12 ENCOUNTER — Encounter: Payer: Self-pay | Admitting: Physician Assistant

## 2024-05-12 ENCOUNTER — Encounter: Payer: Self-pay | Admitting: Nurse Practitioner

## 2024-05-12 ENCOUNTER — Encounter: Payer: Self-pay | Admitting: Internal Medicine

## 2024-05-14 ENCOUNTER — Other Ambulatory Visit: Payer: Self-pay | Admitting: Physician Assistant

## 2024-05-14 DIAGNOSIS — C3412 Malignant neoplasm of upper lobe, left bronchus or lung: Secondary | ICD-10-CM

## 2024-05-15 ENCOUNTER — Other Ambulatory Visit: Payer: Self-pay

## 2024-05-23 NOTE — Progress Notes (Signed)
 [facility] OFFICE PROGRESS NOTE  Erin Joesph DEL, [name] [address] [place] [address]  DIAGNOSIS: Stage IV (T1c, N0, M1b) Non-Small Cell Lung Cancer, adenocarcinoma. She presented with a spiculated left upper lobe lung nodule and large right adrenal gland mass. She was diagnosed in July 2024. She had molecular studies that showed she has KRASG12C which can be used in the second line setting.    PDL1: 1%   Molecular Studies: STK11 but likely not candidate for 4 drug combination and KRASG12C  PRIOR THERAPY: SBRT to the left upper lobe and right adrenal targets under the care of Dr. Dewey. Last dose of treatment expected on 02/20/2023   CURRENT THERAPY: 1) Systemic chemotherapy with carboplatin  for AUC of 5, Alimta  500 Mg/M2 and Keytruda  200 Mg IV every 3 weeks.  First dose April 05, 2023. Status post 20 cycles. Starting from cycle #5, she started maintenance Alimta  and Keytruda .  2) IV iron  with Venofer  300 mg weekly PRN. Last dose on 03/21/23.  INTERVAL HISTORY: Erin Pacheco 73 y.o. female returns to the clinic today for a follow up visit. The patient is currently undergoing maintenance chemotherapy and immunotherapy.    She denies fever, chills, or significant changes in her baseline symptoms.  She describes intermittent leg soreness and notes that her socks leave indentations after prolonged wear, causing discomfort, but she does not perceive significant edema. Appetite fluctuates between day and night without notable overall change.  She experiences occasional morning cough associated with nasal congestion and mucus production, which resolves.   She denies increased dyspnea, chest discomfort, or hemoptysis. She reports occasional nausea and rare vomiting, both controlled with as-needed medication. Bowel habits are stable, with constipation managed by laxatives as needed. She experiences nightly headaches, sometimes treated with acetaminophen , and  denies visual changes.  She continues iron  supplementation and anticoagulation as part of her regimen.   She is here today for evaluation and repeat blood work before undergoing cycle #21.   MEDICAL HISTORY: Past Medical History:  Diagnosis Date   Arthritis    Cancer 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  MEDICATIONS:  Current Outpatient Medications  Medication Sig Dispense Refill   acetaminophen  (TYLENOL ) 325 MG tablet Take 2 tablets (650 mg total) by mouth every 6 (six) hours as needed for mild pain (or Fever >/= 101).     apixaban  (ELIQUIS ) 5 MG TABS tablet Take 1 tablet (5 mg total) by mouth 2 (two) times daily. 60 tablet 3   apixaban  (ELIQUIS ) 5 MG TABS tablet Take 1 tablet (5 mg total) by mouth 2 (two) times daily. 42 tablet 0   apixaban  (ELIQUIS ) 5 MG TABS tablet Take 1 tablet (5 mg total) by mouth 2 (two) times daily. 28 tablet 0   Fluticasone -Umeclidin-Vilant (TRELEGY ELLIPTA ) 100-62.5-25 MCG/ACT AEPB Inhale 1 each into the lungs daily.     folic acid  (FOLVITE ) 1 MG tablet TAKE 1 TAB BY MOUTH DAILY. START 7 DAYS BEFORE PEMETREXED  CHEMOTHERAPY. CONTINUE UNTIL 21 DAYS AFTER PEMETREXED  COMPLETED. 90 tablet 4   lidocaine -prilocaine  (EMLA ) cream APPLY TO THE PORT-A-CATH SITE 30 MINUTES BEFORE TREATMENT. 30 g 1   meclizine  (ANTIVERT ) 25 MG tablet Take 1 tablet (25 mg total) by mouth 3 (three) times daily as  needed for dizziness. 14 tablet 0   metoprolol  succinate (TOPROL -XL) 50 MG 24 hr tablet TAKE 1 TABLET BY MOUTH EVERY DAY WITH OR IMMEDIATELY FOLLOWING A MEAL 90 tablet 3   Multiple Vitamin (MULTIVITAMIN) tablet Take 1 tablet by mouth daily.       ondansetron  (ZOFRAN ) 8 MG tablet Take 1 tablet (8 mg total) by mouth every 8 (eight) hours as needed for nausea or vomiting. 30 tablet  0   oxyCODONE  (OXY IR/ROXICODONE ) 5 MG immediate release tablet Take 1 tablet (5 mg total) by mouth every 6 (six) hours as needed for moderate pain. 60 tablet 0   pantoprazole  (PROTONIX ) 40 MG tablet Take 1 tablet (40 mg total) by mouth daily before breakfast. 30 tablet 1   Polyethyl Glycol-Propyl Glycol (SYSTANE OP) Place 1 drop into both eyes daily as needed (dry eye).     prochlorperazine  (COMPAZINE ) 10 MG tablet Take 1 tablet (10 mg total) by mouth every 6 (six) hours as needed for nausea or vomiting. 30 tablet 1   sacubitril-valsartan (ENTRESTO ) 49-51 MG TAKE 1 TABLET BY MOUTH TWICE A DAY 60 tablet 11   spironolactone  (ALDACTONE ) 25 MG tablet Take 0.5 tablets (12.5 mg total) by mouth daily. Take half a tablet once daily. 45 tablet 3   No current facility-administered medications for this visit.   Facility-Administered Medications Ordered in Other Visits  Medication Dose Route Frequency Provider Last Rate Last Admin   0.9 %  sodium chloride  infusion   Intravenous Once Sherrod Sherrod, MD       heparin  lock flush 100 unit/mL  500 Units Intracatheter Once Sherrod Sherrod, MD       sodium chloride  flush (NS) 0.9 % injection 10 mL  10 mL Intracatheter Once Sherrod Sherrod, MD        SURGICAL HISTORY:  Past Surgical History:  Procedure Laterality Date   ABDOMINAL HYSTERECTOMY     BILATERAL OOPHORECTOMY     BIOPSY  12/09/2022   Procedure: BIOPSY;  Surgeon: Federico Rosario BROCKS, MD;  Location: [facility] ENDOSCOPY;  Service: Gastroenterology;;   BRONCHIAL BIOPSY  01/09/2023   Procedure: BRONCHIAL BIOPSIES;  Surgeon: Brenna Adine CROME, DO;  Location: [facility] ENDOSCOPY;  Service: Pulmonary;;   BRONCHIAL NEEDLE ASPIRATION BIOPSY  01/09/2023   Procedure: BRONCHIAL NEEDLE ASPIRATION BIOPSIES;  Surgeon: Brenna Adine CROME, DO;  Location: [facility] ENDOSCOPY;  Service: Pulmonary;;   CATARACT EXTRACTION W/PHACO  12/27/2010   Procedure: CATARACT EXTRACTION PHACO AND INTRAOCULAR LENS PLACEMENT (IOC);  Surgeon: Cherene Mania;  Location:  [facility] ORS;  Service: Ophthalmology;  Laterality: Right;   CATARACT EXTRACTION W/PHACO  03/28/2011   Procedure: CATARACT EXTRACTION PHACO AND INTRAOCULAR LENS PLACEMENT (IOC);  Surgeon: Cherene Mania;  Location: [facility] ORS;  Service: Ophthalmology;  Laterality: Left;  CDE 7.27   ENTEROSCOPY N/A 02/28/2023   Procedure: ENTEROSCOPY;  Surgeon: Avram Lupita BRAVO, MD;  Location: [facility] ENDOSCOPY;  Service: Gastroenterology;  Laterality: N/A;   ESOPHAGOGASTRODUODENOSCOPY (EGD) WITH PROPOFOL  N/A 12/09/2022   Procedure: ESOPHAGOGASTRODUODENOSCOPY (EGD) WITH PROPOFOL ;  Surgeon: Federico Rosario BROCKS, MD;  Location: [facility] ENDOSCOPY;  Service: Gastroenterology;  Laterality: N/A;   HOT HEMOSTASIS N/A 02/28/2023   Procedure: HOT HEMOSTASIS (ARGON PLASMA COAGULATION/BICAP);  Surgeon: Avram Lupita BRAVO, MD;  Location: [facility]OSCOPY;  Service: Gastroenterology;  Laterality: N/A;   IR IMAGING GUIDED PORT INSERTION  03/23/2023   SUBMUCOSAL TATTOO INJECTION  02/28/2023   Procedure: SUBMUCOSAL TATTOO INJECTION;  Surgeon: Avram Lupita BRAVO, MD;  Location: [facility] ENDOSCOPY;  Service: Gastroenterology;;    REVIEW OF SYSTEMS:  Constitutional: Stable fatigue. Negative for appetite change, chills, fever and unexpected weight change.  HENT: Negative for mouth sores, nosebleeds, sore throat and trouble swallowing.   Eyes: Negative for eye problems and icterus.  Respiratory: Intermittent cough in the AM. Negative for hemoptysis, shortness of breath and wheezing.   Cardiovascular: Negative for chest pain and leg swelling.  Gastrointestinal: Negative for abdominal pain, constipation, diarrhea, nausea and vomiting.  Genitourinary: Negative for bladder incontinence, difficulty urinating, dysuria, frequency and hematuria.   Musculoskeletal: Negative for back pain, gait problem, neck pain and neck stiffness.  Skin: Negative for itching and rash.  Neurological: Negative for dizziness, extremity weakness, gait problem, headaches, light-headedness and seizures.   Hematological: Negative for adenopathy. Does not bruise/bleed easily.  Psychiatric/Behavioral: Negative for confusion, depression and sleep disturbance. The patient is not nervous/anxious.     PHYSICAL EXAMINATION:  Blood pressure (!) 119/57, pulse 71, temperature (!) 97.3 F (36.3 C), temperature source Temporal, resp. rate 16, height 5' 3 (1.6 m), weight 146 lb 4.8 oz (66.4 kg), SpO2 99%.  ECOG PERFORMANCE STATUS: 1  Physical Exam  Constitutional: Oriented to person, place, and time and thin appearing female and, and in no distress. HENT:  Head: Normocephalic and atraumatic.  Mouth/Throat: Oropharynx is clear and moist. No oropharyngeal exudate.  Eyes: Conjunctivae are normal. Right eye exhibits no discharge. Left eye exhibits no discharge. No scleral icterus.  Neck: Normal range of motion. Neck supple.  Cardiovascular: Normal rate, regular rhythm, normal heart sounds and intact distal pulses.   Pulmonary/Chest: Effort normal and breath sounds normal. No respiratory distress. No wheezes. No rales.  Abdominal: Soft. Bowel sounds are normal. Exhibits no distension and no mass. There is no tenderness.  Musculoskeletal: Normal range of motion. Exhibits no edema.  Lymphadenopathy:    No cervical adenopathy.  Neurological: Alert and oriented to person, place, and time. Exhibits muscle wasting. Skin: Skin is warm and dry. No rash noted. Not diaphoretic. No erythema. No pallor.  Psychiatric: Mood, memory and judgment normal.  Vitals reviewed  LABORATORY DATA: Lab Results  Component Value Date   WBC 5.0 05/29/2024   HGB 10.0 (L) 05/29/2024   HCT 29.1 (L) 05/29/2024   MCV 99.3 05/29/2024   PLT 332 05/29/2024      Chemistry      Component Value Date/Time   NA 138 05/29/2024 0845   NA 137 09/27/2023 1224   K 3.9 05/29/2024 0845   CL 104 05/29/2024 0845   CO2 25 05/29/2024 0845   BUN 25 (H) 05/29/2024 0845   BUN 26 09/27/2023 1224   CREATININE 1.42 (H) 05/29/2024 0845       Component Value Date/Time   CALCIUM 9.2 05/29/2024 0845   ALKPHOS 151 (H) 05/29/2024 0845   AST 41 05/29/2024 0845   ALT 25 05/29/2024 0845   BILITOT 0.3 05/29/2024 0845       RADIOGRAPHIC STUDIES:  No results found.   ASSESSMENT/PLAN:  This is a very pleasant 73 year old female with stage IV (T1c, N0, M1) Non-Small Cell Lung Cancer, adenocarcinoma. She presented with a spiculated left upper lobe lung nodule and large right adrenal gland mass. She was diagnosed in July 2024. She had molecular studies that showed she has KRASG12C which can be used in the second line setting.  PDL1 1%, she has STK 11.  Dr. Sherrod does not feel that the patient is not a good candidate for the 4 drug combination and her performance status.   She underwent radiation to the  adrenal lesion and the lung under the care of Dr. Dewey. The last dose was on 02/20/23   She is currently on palliative chemotherapy with carboplatin  for an AUC of 5, Alimta  500 mg/m2 and immunotherapy with Keytruda  200 mg IV every 3 weeks. First dose on 04/05/23 which she tolerated well except for neutropenia for which she receives zarxio  as needed. She is status post 20 cycles.  Starting from cycle #5, she started maintenance Alimta  and Keytruda . Her dose of alimta  was reduced due to her creatinine. This intermittently needs to be held.   Labs were reviewed.  I reviewed her labs, specifically her creatinine 1.42 and GFR 39. She was treated when she saw Dr. Sherrod with same GFR and creatinine on 04/18/24. She will proceed today as scheduled.   I will arrange for 1/2 L over 1 hour to be ran with treatment.    She will continue her iron  supplement and blood thinner.    Will see her back for labs and follow-up visit in 3 weeks for evaluation repeat blood work before undergoing cycle #22  - Continued iron  supplementation and anticoagulation. - Antiemetics effective as needed. - Laxatives used as needed for constipation. - Headaches  managed with over-the-counter medication as needed.    The patient was advised to call immediately if she has any concerning symptoms in the interval. The patient voices understanding of current disease status and treatment options and is in agreement with the current care plan. All questions were answered. The patient knows to call the clinic with any problems, questions or concerns. We can certainly see the patient much sooner if necessary    No orders of the defined types were placed in this encounter.    The total time spent in the appointment was 20-29 minutes  Finnigan Warriner L Charda Janis, [name] 05/29/2024

## 2024-05-27 ENCOUNTER — Other Ambulatory Visit

## 2024-05-27 ENCOUNTER — Ambulatory Visit: Admitting: Internal Medicine

## 2024-05-27 ENCOUNTER — Ambulatory Visit

## 2024-05-29 ENCOUNTER — Inpatient Hospital Stay

## 2024-05-29 ENCOUNTER — Inpatient Hospital Stay: Admitting: Physician Assistant

## 2024-05-29 VITALS — BP 119/57 | HR 71 | Temp 97.3°F | Resp 16 | Ht 63.0 in | Wt 146.3 lb

## 2024-05-29 DIAGNOSIS — R7989 Other specified abnormal findings of blood chemistry: Secondary | ICD-10-CM

## 2024-05-29 DIAGNOSIS — C3492 Malignant neoplasm of unspecified part of left bronchus or lung: Secondary | ICD-10-CM | POA: Diagnosis not present

## 2024-05-29 DIAGNOSIS — C3412 Malignant neoplasm of upper lobe, left bronchus or lung: Secondary | ICD-10-CM

## 2024-05-29 DIAGNOSIS — Z5112 Encounter for antineoplastic immunotherapy: Secondary | ICD-10-CM

## 2024-05-29 DIAGNOSIS — Z5111 Encounter for antineoplastic chemotherapy: Secondary | ICD-10-CM | POA: Diagnosis not present

## 2024-05-29 LAB — CMP (CANCER CENTER ONLY)
ALT: 25 U/L (ref 0–44)
AST: 41 U/L (ref 15–41)
Albumin: 3.3 g/dL — ABNORMAL LOW (ref 3.5–5.0)
Alkaline Phosphatase: 151 U/L — ABNORMAL HIGH (ref 38–126)
Anion gap: 9 (ref 5–15)
BUN: 25 mg/dL — ABNORMAL HIGH (ref 8–23)
CO2: 25 mmol/L (ref 22–32)
Calcium: 9.2 mg/dL (ref 8.9–10.3)
Chloride: 104 mmol/L (ref 98–111)
Creatinine: 1.42 mg/dL — ABNORMAL HIGH (ref 0.44–1.00)
GFR, Estimated: 39 mL/min — ABNORMAL LOW
Glucose, Bld: 138 mg/dL — ABNORMAL HIGH (ref 70–99)
Potassium: 3.9 mmol/L (ref 3.5–5.1)
Sodium: 138 mmol/L (ref 135–145)
Total Bilirubin: 0.3 mg/dL (ref 0.0–1.2)
Total Protein: 7.7 g/dL (ref 6.5–8.1)

## 2024-05-29 LAB — CBC WITH DIFFERENTIAL (CANCER CENTER ONLY)
Abs Immature Granulocytes: 0.03 K/uL (ref 0.00–0.07)
Basophils Absolute: 0 K/uL (ref 0.0–0.1)
Basophils Relative: 1 %
Eosinophils Absolute: 0.1 K/uL (ref 0.0–0.5)
Eosinophils Relative: 2 %
HCT: 29.1 % — ABNORMAL LOW (ref 36.0–46.0)
Hemoglobin: 10 g/dL — ABNORMAL LOW (ref 12.0–15.0)
Immature Granulocytes: 1 %
Lymphocytes Relative: 33 %
Lymphs Abs: 1.6 K/uL (ref 0.7–4.0)
MCH: 34.1 pg — ABNORMAL HIGH (ref 26.0–34.0)
MCHC: 34.4 g/dL (ref 30.0–36.0)
MCV: 99.3 fL (ref 80.0–100.0)
Monocytes Absolute: 0.8 K/uL (ref 0.1–1.0)
Monocytes Relative: 16 %
Neutro Abs: 2.4 K/uL (ref 1.7–7.7)
Neutrophils Relative %: 47 %
Platelet Count: 332 K/uL (ref 150–400)
RBC: 2.93 MIL/uL — ABNORMAL LOW (ref 3.87–5.11)
RDW: 16.3 % — ABNORMAL HIGH (ref 11.5–15.5)
WBC Count: 5 K/uL (ref 4.0–10.5)
nRBC: 0 % (ref 0.0–0.2)

## 2024-05-29 LAB — TSH: TSH: 1.73 u[IU]/mL (ref 0.350–4.500)

## 2024-05-29 MED ORDER — SODIUM CHLORIDE 0.9 % IV SOLN
400.0000 mg/m2 | Freq: Once | INTRAVENOUS | Status: AC
  Administered 2024-05-29: 600 mg via INTRAVENOUS
  Filled 2024-05-29: qty 20

## 2024-05-29 MED ORDER — PROCHLORPERAZINE MALEATE 10 MG PO TABS
10.0000 mg | ORAL_TABLET | Freq: Once | ORAL | Status: AC
  Administered 2024-05-29: 10 mg via ORAL
  Filled 2024-05-29: qty 1

## 2024-05-29 MED ORDER — SODIUM CHLORIDE 0.9 % IV SOLN
200.0000 mg | Freq: Once | INTRAVENOUS | Status: AC
  Administered 2024-05-29: 200 mg via INTRAVENOUS
  Filled 2024-05-29: qty 200

## 2024-05-29 MED ORDER — SODIUM CHLORIDE 0.9 % IV SOLN: Freq: Once | INTRAVENOUS | Status: AC

## 2024-05-29 MED ORDER — CYANOCOBALAMIN 1000 MCG/ML IJ SOLN
1000.0000 ug | Freq: Once | INTRAMUSCULAR | Status: AC
  Administered 2024-05-29: 1000 ug via INTRAMUSCULAR
  Filled 2024-05-29: qty 1

## 2024-05-29 NOTE — Progress Notes (Signed)
 Alimta  dose ok rounded to vial size 600mg 

## 2024-05-29 NOTE — Patient Instructions (Signed)
 [facility] - A DEPT OF [facility]  Discharge Instructions: Thank you for choosing [facility] to provide your oncology and hematology care.   If you have a lab appointment with the Cancer Center, please go directly to the Cancer Center and check in at the registration area.   Wear comfortable clothing and clothing appropriate for easy access to any Portacath or PICC line.   We strive to give you quality time with your provider. You may need to reschedule your appointment if you arrive late (15 or more minutes).  Arriving late affects you and other patients whose appointments are after yours.  Also, if you miss three or more appointments without notifying the office, you may be dismissed from the clinic at the provider's discretion.      For prescription refill requests, have your pharmacy contact our office and allow 72 hours for refills to be completed.    Today you received the following chemotherapy and/or immunotherapy agents: Pembrolizumab  (Keytruda ) & Pemetrexed  disodium (Alimta )    To help prevent nausea and vomiting after your treatment, we encourage you to take your nausea medication as directed.  BELOW ARE SYMPTOMS THAT SHOULD BE REPORTED IMMEDIATELY: *FEVER GREATER THAN 100.4 F (38 C) OR HIGHER *CHILLS OR SWEATING *NAUSEA AND VOMITING THAT IS NOT CONTROLLED WITH YOUR NAUSEA MEDICATION *UNUSUAL SHORTNESS OF BREATH *UNUSUAL BRUISING OR BLEEDING *URINARY PROBLEMS (pain or burning when urinating, or frequent urination) *BOWEL PROBLEMS (unusual diarrhea, constipation, pain near the anus) TENDERNESS IN MOUTH AND THROAT WITH OR WITHOUT PRESENCE OF ULCERS (sore throat, sores in mouth, or a toothache) UNUSUAL RASH, SWELLING OR PAIN  UNUSUAL VAGINAL DISCHARGE OR ITCHING   Items with * indicate a potential emergency and should be followed up as soon as possible or go to the Emergency Department if any problems should occur.  Please show  the CHEMOTHERAPY ALERT CARD or IMMUNOTHERAPY ALERT CARD at check-in to the Emergency Department and triage nurse.  Should you have questions after your visit or need to cancel or reschedule your appointment, please contact [facility]  Dept: [phone]  and follow the prompts.  Office hours are 8:00 a.m. to 4:30 p.m. Monday - Friday. Please note that voicemails left after 4:00 p.m. may not be returned until the following business day.  We are closed weekends and major holidays. You have access to a nurse at all times for urgent questions. Please call the main number to the clinic Dept: [phone] and follow the prompts.   For any non-urgent questions, you may also contact your provider using MyChart. We now offer e-Visits for anyone 64 and older to request care online for non-urgent symptoms. For details visit [email or web address].   Also download the MyChart app! Go to the app store, search MyChart, open the app, select [place], and log in with your MyChart username and password.

## 2024-05-30 LAB — T4: T4, Total: 9.2 ug/dL (ref 4.5–12.0)

## 2024-06-13 ENCOUNTER — Other Ambulatory Visit: Payer: Self-pay | Admitting: Internal Medicine

## 2024-06-13 DIAGNOSIS — C3412 Malignant neoplasm of upper lobe, left bronchus or lung: Secondary | ICD-10-CM

## 2024-06-16 ENCOUNTER — Other Ambulatory Visit: Payer: Self-pay

## 2024-06-20 ENCOUNTER — Inpatient Hospital Stay: Attending: Physician Assistant | Admitting: Internal Medicine

## 2024-06-20 ENCOUNTER — Inpatient Hospital Stay

## 2024-06-20 ENCOUNTER — Inpatient Hospital Stay: Attending: Physician Assistant

## 2024-06-20 VITALS — BP 118/67 | HR 69 | Temp 99.0°F | Resp 17 | Ht 63.0 in | Wt 144.0 lb

## 2024-06-20 DIAGNOSIS — C3490 Malignant neoplasm of unspecified part of unspecified bronchus or lung: Secondary | ICD-10-CM | POA: Diagnosis not present

## 2024-06-20 DIAGNOSIS — Z79631 Long term (current) use of antimetabolite agent: Secondary | ICD-10-CM | POA: Diagnosis not present

## 2024-06-20 DIAGNOSIS — Z79620 Long term (current) use of immunosuppressive biologic: Secondary | ICD-10-CM | POA: Insufficient documentation

## 2024-06-20 DIAGNOSIS — C3412 Malignant neoplasm of upper lobe, left bronchus or lung: Secondary | ICD-10-CM

## 2024-06-20 DIAGNOSIS — C7971 Secondary malignant neoplasm of right adrenal gland: Secondary | ICD-10-CM | POA: Insufficient documentation

## 2024-06-20 DIAGNOSIS — Z5112 Encounter for antineoplastic immunotherapy: Secondary | ICD-10-CM | POA: Insufficient documentation

## 2024-06-20 DIAGNOSIS — Z5111 Encounter for antineoplastic chemotherapy: Secondary | ICD-10-CM | POA: Diagnosis present

## 2024-06-20 LAB — CMP (CANCER CENTER ONLY)
ALT: 17 U/L (ref 0–44)
AST: 39 U/L (ref 15–41)
Albumin: 3.3 g/dL — ABNORMAL LOW (ref 3.5–5.0)
Alkaline Phosphatase: 135 U/L — ABNORMAL HIGH (ref 38–126)
Anion gap: 8 (ref 5–15)
BUN: 17 mg/dL (ref 8–23)
CO2: 27 mmol/L (ref 22–32)
Calcium: 9.2 mg/dL (ref 8.9–10.3)
Chloride: 103 mmol/L (ref 98–111)
Creatinine: 1.24 mg/dL — ABNORMAL HIGH (ref 0.44–1.00)
GFR, Estimated: 46 mL/min — ABNORMAL LOW
Glucose, Bld: 75 mg/dL (ref 70–99)
Potassium: 4.3 mmol/L (ref 3.5–5.1)
Sodium: 137 mmol/L (ref 135–145)
Total Bilirubin: 0.3 mg/dL (ref 0.0–1.2)
Total Protein: 8.2 g/dL — ABNORMAL HIGH (ref 6.5–8.1)

## 2024-06-20 LAB — CBC WITH DIFFERENTIAL (CANCER CENTER ONLY)
Abs Immature Granulocytes: 0.04 K/uL (ref 0.00–0.07)
Basophils Absolute: 0.1 K/uL (ref 0.0–0.1)
Basophils Relative: 1 %
Eosinophils Absolute: 0.1 K/uL (ref 0.0–0.5)
Eosinophils Relative: 1 %
HCT: 29.9 % — ABNORMAL LOW (ref 36.0–46.0)
Hemoglobin: 10 g/dL — ABNORMAL LOW (ref 12.0–15.0)
Immature Granulocytes: 1 %
Lymphocytes Relative: 36 %
Lymphs Abs: 1.8 K/uL (ref 0.7–4.0)
MCH: 32.8 pg (ref 26.0–34.0)
MCHC: 33.4 g/dL (ref 30.0–36.0)
MCV: 98 fL (ref 80.0–100.0)
Monocytes Absolute: 0.9 K/uL (ref 0.1–1.0)
Monocytes Relative: 19 %
Neutro Abs: 2.1 K/uL (ref 1.7–7.7)
Neutrophils Relative %: 42 %
Platelet Count: 350 K/uL (ref 150–400)
RBC: 3.05 MIL/uL — ABNORMAL LOW (ref 3.87–5.11)
RDW: 16.3 % — ABNORMAL HIGH (ref 11.5–15.5)
WBC Count: 4.9 K/uL (ref 4.0–10.5)
nRBC: 0 % (ref 0.0–0.2)

## 2024-06-20 MED ORDER — SODIUM CHLORIDE 0.9 % IV SOLN: Freq: Once | INTRAVENOUS | Status: AC

## 2024-06-20 MED ORDER — SODIUM CHLORIDE 0.9 % IV SOLN
400.0000 mg/m2 | Freq: Once | INTRAVENOUS | Status: AC
  Administered 2024-06-20: 600 mg via INTRAVENOUS
  Filled 2024-06-20: qty 20

## 2024-06-20 MED ORDER — SODIUM CHLORIDE 0.9% FLUSH: 10.0000 mL | INTRAVENOUS | Status: DC | PRN

## 2024-06-20 MED ORDER — SODIUM CHLORIDE 0.9 % IV SOLN
200.0000 mg | Freq: Once | INTRAVENOUS | Status: AC
  Administered 2024-06-20: 200 mg via INTRAVENOUS
  Filled 2024-06-20: qty 200

## 2024-06-20 MED ORDER — PROCHLORPERAZINE MALEATE 10 MG PO TABS
10.0000 mg | ORAL_TABLET | Freq: Once | ORAL | Status: AC
  Administered 2024-06-20: 10 mg via ORAL
  Filled 2024-06-20: qty 1

## 2024-06-20 NOTE — Patient Instructions (Signed)
 [facility] - A DEPT OF [facility]  Discharge Instructions: Thank you for choosing [facility] to provide your oncology and hematology care.   If you have a lab appointment with the Cancer Center, please go directly to the Cancer Center and check in at the registration area.   Wear comfortable clothing and clothing appropriate for easy access to any Portacath or PICC line.   We strive to give you quality time with your provider. You may need to reschedule your appointment if you arrive late (15 or more minutes).  Arriving late affects you and other patients whose appointments are after yours.  Also, if you miss three or more appointments without notifying the office, you may be dismissed from the clinic at the provider's discretion.      For prescription refill requests, have your pharmacy contact our office and allow 72 hours for refills to be completed.    Today you received the following chemotherapy and/or immunotherapy agents keytruda  and alimta        To help prevent nausea and vomiting after your treatment, we encourage you to take your nausea medication as directed.  BELOW ARE SYMPTOMS THAT SHOULD BE REPORTED IMMEDIATELY: *FEVER GREATER THAN 100.4 F (38 C) OR HIGHER *CHILLS OR SWEATING *NAUSEA AND VOMITING THAT IS NOT CONTROLLED WITH YOUR NAUSEA MEDICATION *UNUSUAL SHORTNESS OF BREATH *UNUSUAL BRUISING OR BLEEDING *URINARY PROBLEMS (pain or burning when urinating, or frequent urination) *BOWEL PROBLEMS (unusual diarrhea, constipation, pain near the anus) TENDERNESS IN MOUTH AND THROAT WITH OR WITHOUT PRESENCE OF ULCERS (sore throat, sores in mouth, or a toothache) UNUSUAL RASH, SWELLING OR PAIN  UNUSUAL VAGINAL DISCHARGE OR ITCHING   Items with * indicate a potential emergency and should be followed up as soon as possible or go to the Emergency Department if any problems should occur.  Please show the CHEMOTHERAPY ALERT CARD or  IMMUNOTHERAPY ALERT CARD at check-in to the Emergency Department and triage nurse.  Should you have questions after your visit or need to cancel or reschedule your appointment, please contact [facility] - A DEPT OF [facility]  Dept: [phone]  and follow the prompts.  Office hours are 8:00 a.m. to 4:30 p.m. Monday - Friday. Please note that voicemails left after 4:00 p.m. may not be returned until the following business day.  We are closed weekends and major holidays. You have access to a nurse at all times for urgent questions. Please call the main number to the clinic Dept: [phone] and follow the prompts.   For any non-urgent questions, you may also contact your provider using MyChart. We now offer e-Visits for anyone 33 and older to request care online for non-urgent symptoms. For details visit [email or web address].   Also download the MyChart app! Go to the app store, search MyChart, open the app, select [place], and log in with your MyChart username and password.

## 2024-06-20 NOTE — Progress Notes (Signed)
 "     [facility] [phone]   Fax:([phone]  OFFICE PROGRESS NOTE  Erin Joesph DEL, [name] [address] [place] [address]  DIAGNOSIS:  Stage IV (T1c, N0, M1b) Non-Small Cell Lung Cancer, adenocarcinoma. She presented with a spiculated left upper lobe lung nodule and large right adrenal gland mass. She was diagnosed in July 2024. She had molecular studies that showed she has KRASG12C which can be used in the second line setting.    PDL1: 1%   PRIOR THERAPY: SBRT to the left upper lobe and right adrenal targets under the care of Dr. Dewey.  Last dose of treatment expected on 02/20/2023   CURRENT THERAPY: Systemic chemotherapy with carboplatin  for AUC of 5, Alimta  500 Mg/M2 and Keytruda  200 Mg IV every 3 weeks.  First dose April 05, 2023.Starting cycle #5 she has been on maintenance treatment with Alimta  and Keytruda  every 3 weeks.  Status post 21 cycles  INTERVAL HISTORY: Erin Pacheco 74 y.o. female returns to the clinic today for follow-up visit accompanied by her daughter. Discussed the use of AI scribe software for clinical note transcription with the patient, who gave verbal consent to proceed.  History of Present Illness Erin Pacheco is a 74 year old female with metastatic non-small cell lung cancer presenting for evaluation prior to cycle 22 of maintenance pemetrexed  and pembrolizumab .  She has completed twenty-one cycles of maintenance pemetrexed  and pembrolizumab , administered every three weeks, and is being assessed prior to cycle twenty-two. Her most recent imaging was in November 2025.  She reports no new or significant symptoms since her last visit. She describes occasional mild sinus congestion but denies chest pain, hemoptysis, cough, or diarrhea. She notes rare episodes of emesis but denies nausea.   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  MEDICATIONS:  Current Outpatient Medications  Medication Sig Dispense Refill   acetaminophen  (TYLENOL ) 325 MG tablet Take 2 tablets (650 mg total) by mouth every 6 (six) hours as needed for mild pain (or Fever >/= 101).     apixaban  (ELIQUIS ) 5 MG TABS tablet Take 1 tablet (5 mg total) by mouth 2 (two) times daily. 60 tablet 3   apixaban  (ELIQUIS ) 5 MG TABS tablet Take 1 tablet (5 mg total) by mouth 2 (two) times daily. 42 tablet 0   apixaban  (ELIQUIS ) 5 MG TABS tablet Take 1 tablet (5 mg total) by mouth 2 (two) times daily. 28 tablet 0   Fluticasone -Umeclidin-Vilant (TRELEGY ELLIPTA ) 100-62.5-25 MCG/ACT AEPB Inhale 1 each into the lungs daily.     folic acid  (FOLVITE ) 1 MG tablet TAKE 1 TAB BY MOUTH DAILY. START 7 DAYS BEFORE PEMETREXED  CHEMOTHERAPY. CONTINUE UNTIL 21 DAYS AFTER PEMETREXED  COMPLETED. 90 tablet 4   lidocaine -prilocaine  (EMLA ) cream APPLY TO THE PORT-A-CATH SITE 30 MINUTES BEFORE TREATMENT. 30 g 1   meclizine  (ANTIVERT ) 25 MG tablet Take 1 tablet (25 mg total) by mouth 3 (three) times daily as needed for dizziness. 14 tablet 0   metoprolol  succinate (TOPROL -XL) 50 MG 24 hr tablet TAKE 1 TABLET BY MOUTH EVERY DAY WITH OR IMMEDIATELY FOLLOWING A MEAL 90 tablet 3   Multiple Vitamin (MULTIVITAMIN)  tablet Take 1 tablet by mouth daily.       ondansetron  (ZOFRAN ) 8 MG tablet Take 1 tablet (8 mg total) by mouth every 8 (eight) hours as needed for nausea or vomiting. 30 tablet 0   oxyCODONE  (OXY IR/ROXICODONE ) 5 MG immediate release tablet Take 1 tablet (5 mg total) by mouth every 6 (six) hours as needed for moderate pain. 60 tablet 0   pantoprazole  (PROTONIX ) 40 MG tablet Take 1 tablet (40 mg total) by mouth daily before breakfast. 30 tablet 1   Polyethyl Glycol-Propyl  Glycol (SYSTANE OP) Place 1 drop into both eyes daily as needed (dry eye).     prochlorperazine  (COMPAZINE ) 10 MG tablet Take 1 tablet (10 mg total) by mouth every 6 (six) hours as needed for nausea or vomiting. 30 tablet 1   sacubitril-valsartan (ENTRESTO ) 49-51 MG TAKE 1 TABLET BY MOUTH TWICE A DAY 60 tablet 11   spironolactone  (ALDACTONE ) 25 MG tablet Take 0.5 tablets (12.5 mg total) by mouth daily. Take half a tablet once daily. 45 tablet 3   No current facility-administered medications for this visit.   Facility-Administered Medications Ordered in Other Visits  Medication Dose Route Frequency Provider Last Rate Last Admin   0.9 %  sodium chloride  infusion   Intravenous Once Sherrod Sherrod, MD       heparin  lock flush 100 unit/mL  500 Units Intracatheter Once Sherrod Sherrod, MD       sodium chloride  flush (NS) 0.9 % injection 10 mL  10 mL Intracatheter Once Sherrod Sherrod, MD        SURGICAL HISTORY:  Past Surgical History:  Procedure Laterality Date   ABDOMINAL HYSTERECTOMY     BILATERAL OOPHORECTOMY     BIOPSY  12/09/2022   Procedure: BIOPSY;  Surgeon: Federico Rosario BROCKS, MD;  Location: [facility] ENDOSCOPY;  Service: Gastroenterology;;   BRONCHIAL BIOPSY  01/09/2023   Procedure: BRONCHIAL BIOPSIES;  Surgeon: Brenna Adine CROME, DO;  Location: [facility] ENDOSCOPY;  Service: Pulmonary;;   BRONCHIAL NEEDLE ASPIRATION BIOPSY  01/09/2023   Procedure: BRONCHIAL NEEDLE ASPIRATION BIOPSIES;  Surgeon: Brenna Adine CROME, DO;  Location: [facility] ENDOSCOPY;  Service: Pulmonary;;   CATARACT EXTRACTION W/PHACO  12/27/2010   Procedure: CATARACT EXTRACTION PHACO AND INTRAOCULAR LENS PLACEMENT (IOC);  Surgeon: Cherene Mania;  Location: [facility] ORS;  Service: Ophthalmology;  Laterality: Right;   CATARACT EXTRACTION W/PHACO  03/28/2011   Procedure: CATARACT EXTRACTION PHACO AND INTRAOCULAR LENS PLACEMENT (IOC);  Surgeon: Cherene Mania;  Location: [facility] ORS;  Service: Ophthalmology;  Laterality: Left;  CDE 7.27   ENTEROSCOPY N/A 02/28/2023    Procedure: ENTEROSCOPY;  Surgeon: Avram Lupita BRAVO, MD;  Location: [facility] ENDOSCOPY;  Service: Gastroenterology;  Laterality: N/A;   ESOPHAGOGASTRODUODENOSCOPY (EGD) WITH PROPOFOL  N/A 12/09/2022   Procedure: ESOPHAGOGASTRODUODENOSCOPY (EGD) WITH PROPOFOL ;  Surgeon: Federico Rosario BROCKS, MD;  Location: [facility] ENDOSCOPY;  Service: Gastroenterology;  Laterality: N/A;   HOT HEMOSTASIS N/A 02/28/2023   Procedure: HOT HEMOSTASIS (ARGON PLASMA COAGULATION/BICAP);  Surgeon: Avram Lupita BRAVO, MD;  Location: [facility];  Service: Gastroenterology;  Laterality: N/A;   IR IMAGING GUIDED PORT INSERTION  03/23/2023   SUBMUCOSAL TATTOO INJECTION  02/28/2023   Procedure: SUBMUCOSAL TATTOO INJECTION;  Surgeon: Avram Lupita BRAVO, MD;  Location: [facility] ENDOSCOPY;  Service: Gastroenterology;;    REVIEW OF SYSTEMS:  A comprehensive review of systems was negative except for: Constitutional: positive for fatigue   PHYSICAL EXAMINATION: General appearance: alert, cooperative, fatigued, and no distress Head: Normocephalic, without obvious abnormality, atraumatic Neck: no adenopathy, no JVD,  supple, symmetrical, trachea midline, and thyroid  not enlarged, symmetric, no tenderness/mass/nodules Lymph nodes: Cervical, supraclavicular, and axillary nodes normal. Resp: clear to auscultation bilaterally Back: symmetric, no curvature. ROM normal. No CVA tenderness. Cardio: regular rate and rhythm, S1, S2 normal, no murmur, click, rub or gallop GI: soft, non-tender; bowel sounds normal; no masses,  no organomegaly Extremities: extremities normal, atraumatic, no cyanosis or edema  ECOG PERFORMANCE STATUS: 1 - Symptomatic but completely ambulatory  Blood pressure 118/67, pulse 69, temperature 99 F (37.2 C), temperature source Temporal, resp. rate 17, height 5' 3 (1.6 m), weight 144 lb (65.3 kg), SpO2 95%.  LABORATORY DATA: Lab Results  Component Value Date   WBC 4.9 06/20/2024   HGB 10.0 (L) 06/20/2024   HCT 29.9 (L) 06/20/2024   MCV 98.0  06/20/2024   PLT 350 06/20/2024      Chemistry      Component Value Date/Time   NA 137 06/20/2024 1435   NA 137 09/27/2023 1224   K 4.3 06/20/2024 1435   CL 103 06/20/2024 1435   CO2 27 06/20/2024 1435   BUN 17 06/20/2024 1435   BUN 26 09/27/2023 1224   CREATININE 1.24 (H) 06/20/2024 1435      Component Value Date/Time   CALCIUM 9.2 06/20/2024 1435   ALKPHOS 135 (H) 06/20/2024 1435   AST 39 06/20/2024 1435   ALT 17 06/20/2024 1435   BILITOT 0.3 06/20/2024 1435       RADIOGRAPHIC STUDIES: No results found.     ASSESSMENT AND PLAN: This is a very pleasant 74 years old African-American female recently diagnosed with a stage IV (T1c, N0, M1b) non-small cell lung cancer, adenocarcinoma with positive KRAS G12C mutation and PD-L1 expression of 1%.  She started systemic chemotherapy with carboplatin  for AUC of 5, Alimta  500 Mg/M2 and Keytruda  200 Mg IV every 3 weeks.  She is status post 21 cycle of her treatment.  Her current dose of Alimta  is 400 mg/M2.  This was reduced secondary to renal insufficiency. She has been tolerating this treatment fairly well except for the fatigue. Assessment and Plan Assessment & Plan Lung cancer Metastatic lung cancer managed with maintenance pemetrexed  (Alimta ) and pembrolizumab  (Keytruda ). She reports only mild, intermittent sinus congestion and rare emesis, without evidence of acute toxicity or disease-related complications. Interval imaging planned to reassess response. - Administered cycle 22 of maintenance pemetrexed  and pembrolizumab . - Ordered follow-up imaging in two weeks to assess disease status. - Scheduled follow-up visit in three weeks to review imaging and clinical status. The patient was advised to call immediately if she has any other concerning symptoms in the interval. The patient voices understanding of current disease status and treatment options and is in agreement with the current care plan.  All questions were answered. The  patient knows to call the clinic with any problems, questions or concerns. We can certainly see the patient much sooner if necessary. The total time spent in the appointment was 20 minutes.  Disclaimer: This note was dictated with voice recognition software. Similar sounding words can inadvertently be transcribed and may not be corrected upon review.        "

## 2024-06-21 ENCOUNTER — Other Ambulatory Visit: Payer: Self-pay

## 2024-06-29 ENCOUNTER — Other Ambulatory Visit: Payer: Self-pay

## 2024-07-04 ENCOUNTER — Ambulatory Visit (HOSPITAL_COMMUNITY)
Admission: RE | Admit: 2024-07-04 | Discharge: 2024-07-04 | Disposition: A | Discharge status: Home / Self Care | Source: Ambulatory Visit | Attending: Internal Medicine | Admitting: Internal Medicine

## 2024-07-04 DIAGNOSIS — C3490 Malignant neoplasm of unspecified part of unspecified bronchus or lung: Secondary | ICD-10-CM | POA: Diagnosis present

## 2024-07-04 MED ORDER — DIPHENHYDRAMINE HCL 25 MG PO CAPS
ORAL_CAPSULE | ORAL | Status: AC
  Filled 2024-07-04: qty 1

## 2024-07-04 MED ORDER — DIPHENHYDRAMINE HCL 25 MG PO CAPS
25.0000 mg | ORAL_CAPSULE | ORAL | Status: AC | PRN
  Administered 2024-07-04: 25 mg via ORAL

## 2024-07-04 MED ORDER — IOHEXOL 300 MG/ML  SOLN
80.0000 mL | Freq: Once | INTRAMUSCULAR | Status: AC | PRN
  Administered 2024-07-04: 80 mL via INTRAVENOUS

## 2024-07-04 NOTE — Progress Notes (Signed)
 Patient was seen in CT for urticarial reaction following contrast administration. Patient has received contrast previously and denied any prior allergic reactions. She states that she had pruritus of the contralateral forearm to the arm receiving contrast. The IV was removed. Patient denied dyspnea and a cough. She did not have facial swelling or throat swelling. Patient was given 25 mg of diphenhydramine  and will be assessed before departure from the facility. Discussed with patient if symptoms worsen to go to the ED. Patient was not driving herself home.

## 2024-07-09 ENCOUNTER — Telehealth: Payer: Self-pay | Admitting: Internal Medicine

## 2024-07-09 NOTE — Telephone Encounter (Signed)
 Pt c/o medication issue:  1. Name of Medication: sacubitril-valsartan (ENTRESTO ) 49-51 MG  2. How are you currently taking this medication (dosage and times per day)? As prescribed   3. Are you having a reaction (difficulty breathing--STAT)? No   4. What is your medication issue? Pt needs updated patient assistance for medication. Please advise.

## 2024-07-11 ENCOUNTER — Inpatient Hospital Stay: Attending: Physician Assistant

## 2024-07-11 ENCOUNTER — Inpatient Hospital Stay: Attending: Physician Assistant | Admitting: Internal Medicine

## 2024-07-11 ENCOUNTER — Inpatient Hospital Stay

## 2024-07-11 VITALS — BP 111/62 | HR 72 | Temp 97.8°F | Resp 17 | Ht 63.0 in | Wt 142.8 lb

## 2024-07-11 DIAGNOSIS — C3412 Malignant neoplasm of upper lobe, left bronchus or lung: Secondary | ICD-10-CM

## 2024-07-11 DIAGNOSIS — C3492 Malignant neoplasm of unspecified part of left bronchus or lung: Secondary | ICD-10-CM

## 2024-07-11 LAB — CBC WITH DIFFERENTIAL (CANCER CENTER ONLY)
Abs Immature Granulocytes: 0.03 10*3/uL (ref 0.00–0.07)
Basophils Absolute: 0 10*3/uL (ref 0.0–0.1)
Basophils Relative: 1 %
Eosinophils Absolute: 0.1 10*3/uL (ref 0.0–0.5)
Eosinophils Relative: 2 %
HCT: 29.9 % — ABNORMAL LOW (ref 36.0–46.0)
Hemoglobin: 9.9 g/dL — ABNORMAL LOW (ref 12.0–15.0)
Immature Granulocytes: 1 %
Lymphocytes Relative: 33 %
Lymphs Abs: 1.6 10*3/uL (ref 0.7–4.0)
MCH: 32.4 pg (ref 26.0–34.0)
MCHC: 33.1 g/dL (ref 30.0–36.0)
MCV: 97.7 fL (ref 80.0–100.0)
Monocytes Absolute: 0.8 10*3/uL (ref 0.1–1.0)
Monocytes Relative: 16 %
Neutro Abs: 2.3 10*3/uL (ref 1.7–7.7)
Neutrophils Relative %: 47 %
Platelet Count: 357 10*3/uL (ref 150–400)
RBC: 3.06 MIL/uL — ABNORMAL LOW (ref 3.87–5.11)
RDW: 16.6 % — ABNORMAL HIGH (ref 11.5–15.5)
WBC Count: 4.8 10*3/uL (ref 4.0–10.5)
nRBC: 0 % (ref 0.0–0.2)

## 2024-07-11 LAB — CMP (CANCER CENTER ONLY)
ALT: 20 U/L (ref 0–44)
AST: 35 U/L (ref 15–41)
Albumin: 3.3 g/dL — ABNORMAL LOW (ref 3.5–5.0)
Alkaline Phosphatase: 128 U/L — ABNORMAL HIGH (ref 38–126)
Anion gap: 10 (ref 5–15)
BUN: 17 mg/dL (ref 8–23)
CO2: 25 mmol/L (ref 22–32)
Calcium: 9.4 mg/dL (ref 8.9–10.3)
Chloride: 104 mmol/L (ref 98–111)
Creatinine: 1.32 mg/dL — ABNORMAL HIGH (ref 0.44–1.00)
GFR, Estimated: 42 mL/min — ABNORMAL LOW
Glucose, Bld: 97 mg/dL (ref 70–99)
Potassium: 4.1 mmol/L (ref 3.5–5.1)
Sodium: 139 mmol/L (ref 135–145)
Total Bilirubin: 0.3 mg/dL (ref 0.0–1.2)
Total Protein: 8 g/dL (ref 6.5–8.1)

## 2024-07-11 MED ORDER — SODIUM CHLORIDE 0.9 % IV SOLN
400.0000 mg/m2 | Freq: Once | INTRAVENOUS | Status: AC
  Administered 2024-07-11: 600 mg via INTRAVENOUS
  Filled 2024-07-11: qty 20

## 2024-07-11 MED ORDER — SODIUM CHLORIDE 0.9 % IV SOLN
200.0000 mg | Freq: Once | INTRAVENOUS | Status: AC
  Administered 2024-07-11: 200 mg via INTRAVENOUS
  Filled 2024-07-11: qty 200

## 2024-07-11 MED ORDER — SODIUM CHLORIDE 0.9 % IV SOLN: Freq: Once | INTRAVENOUS | Status: AC

## 2024-07-11 MED ORDER — PROCHLORPERAZINE MALEATE 10 MG PO TABS
10.0000 mg | ORAL_TABLET | Freq: Once | ORAL | Status: AC
  Administered 2024-07-11: 10 mg via ORAL
  Filled 2024-07-11: qty 1

## 2024-07-11 NOTE — Patient Instructions (Signed)
 [facility] - A DEPT OF [facility]  Discharge Instructions: Thank you for choosing [facility] to provide your oncology and hematology care.   If you have a lab appointment with the Cancer Center, please go directly to the Cancer Center and check in at the registration area.   Wear comfortable clothing and clothing appropriate for easy access to any Portacath or PICC line.   We strive to give you quality time with your provider. You may need to reschedule your appointment if you arrive late (15 or more minutes).  Arriving late affects you and other patients whose appointments are after yours.  Also, if you miss three or more appointments without notifying the office, you may be dismissed from the clinic at the provider's discretion.      For prescription refill requests, have your pharmacy contact our office and allow 72 hours for refills to be completed.    Today you received the following chemotherapy and/or immunotherapy agents keytruda  and alimta        To help prevent nausea and vomiting after your treatment, we encourage you to take your nausea medication as directed.  BELOW ARE SYMPTOMS THAT SHOULD BE REPORTED IMMEDIATELY: *FEVER GREATER THAN 100.4 F (38 C) OR HIGHER *CHILLS OR SWEATING *NAUSEA AND VOMITING THAT IS NOT CONTROLLED WITH YOUR NAUSEA MEDICATION *UNUSUAL SHORTNESS OF BREATH *UNUSUAL BRUISING OR BLEEDING *URINARY PROBLEMS (pain or burning when urinating, or frequent urination) *BOWEL PROBLEMS (unusual diarrhea, constipation, pain near the anus) TENDERNESS IN MOUTH AND THROAT WITH OR WITHOUT PRESENCE OF ULCERS (sore throat, sores in mouth, or a toothache) UNUSUAL RASH, SWELLING OR PAIN  UNUSUAL VAGINAL DISCHARGE OR ITCHING   Items with * indicate a potential emergency and should be followed up as soon as possible or go to the Emergency Department if any problems should occur.  Please show the CHEMOTHERAPY ALERT CARD or  IMMUNOTHERAPY ALERT CARD at check-in to the Emergency Department and triage nurse.  Should you have questions after your visit or need to cancel or reschedule your appointment, please contact [facility] - A DEPT OF [facility]  Dept: [phone]  and follow the prompts.  Office hours are 8:00 a.m. to 4:30 p.m. Monday - Friday. Please note that voicemails left after 4:00 p.m. may not be returned until the following business day.  We are closed weekends and major holidays. You have access to a nurse at all times for urgent questions. Please call the main number to the clinic Dept: [phone] and follow the prompts.   For any non-urgent questions, you may also contact your provider using MyChart. We now offer e-Visits for anyone 33 and older to request care online for non-urgent symptoms. For details visit [email or web address].   Also download the MyChart app! Go to the app store, search MyChart, open the app, select [place], and log in with your MyChart username and password.

## 2024-07-11 NOTE — Progress Notes (Signed)
 "     [facility] [phone]   [phone]  OFFICE PROGRESS NOTE  [name], [name] [address] [place] [address]  DIAGNOSIS:  Stage IV (T1c, N0, M1b) Non-Small Cell Lung Cancer, adenocarcinoma. She presented with a spiculated left upper lobe lung nodule and large right adrenal gland mass. She was diagnosed in July 2024. She had molecular studies that showed she has KRASG12C which can be used in the second line setting.    PDL1: 1%   PRIOR THERAPY: SBRT to the left upper lobe and right adrenal targets under the care of Dr. Dewey.  Last dose of treatment expected on 02/20/2023   CURRENT THERAPY: Systemic chemotherapy with carboplatin  for AUC of 5, Alimta  500 Mg/M2 and Keytruda  200 Mg IV every 3 weeks.  First dose April 05, 2023. Starting cycle #5 she has been on maintenance treatment with Alimta  and Keytruda  every 3 weeks.  Status post 22 cycles  INTERVAL HISTORY: Erin Pacheco 74 y.o. female returns to the clinic today for follow-up visit accompanied by her daughter. Discussed the use of AI scribe software for clinical note transcription with the patient, who gave verbal consent to proceed.  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  She was diagnosed in July 2024 with stage IV non-small cell lung adenocarcinoma, positive for KRAS G12C mutation and low PD-L1 expression. Initial systemic therapy included carboplatin , pemetrexed , and pembrolizumab  every three weeks for four cycles, followed by maintenance pemetrexed  and pembrolizumab  beginning with cycle five. She has completed 22 cycles of maintenance therapy and is here for evaluation prior to cycle 23.  Over the past three weeks, she has experienced  mild dyspnea and intermittent cough without chest pain. She denies nausea, vomiting, diarrhea, or headaches. She has had intermittent upper respiratory symptoms for over a month, managed with over-the-counter medications. No new or worsening symptoms were reported.  During the visit, she and her daughter inquired about the duration of immunotherapy and ongoing management. She has not received a recent influenza vaccination but has previously received pneumococcal and COVID-19 vaccinations.      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  MEDICATIONS:  Current Outpatient Medications  Medication Sig Dispense Refill   acetaminophen  (TYLENOL ) 325 MG tablet Take 2 tablets (650 mg total) by mouth every 6 (six) hours as needed for mild pain (or Fever >/= 101).     apixaban  (ELIQUIS ) 5 MG TABS tablet Take 1 tablet (5 mg total) by mouth 2 (two) times daily. 60 tablet 3   apixaban  (ELIQUIS ) 5 MG TABS tablet Take 1 tablet (5 mg total) by mouth 2 (two) times daily. 42 tablet 0   apixaban  (ELIQUIS ) 5 MG TABS tablet Take 1 tablet (5 mg total) by mouth 2 (two) times daily. 28 tablet 0   Fluticasone -Umeclidin-Vilant (TRELEGY ELLIPTA ) 100-62.5-25 MCG/ACT AEPB Inhale 1 each into the lungs daily.     folic acid  (FOLVITE ) 1 MG tablet  TAKE 1 TAB BY MOUTH DAILY. START 7 DAYS BEFORE PEMETREXED  CHEMOTHERAPY. CONTINUE UNTIL 21 DAYS AFTER PEMETREXED  COMPLETED. 90 tablet 4   lidocaine -prilocaine  (EMLA ) cream APPLY TO THE PORT-A-CATH SITE 30 MINUTES BEFORE TREATMENT. 30 g 1   meclizine  (ANTIVERT ) 25 MG tablet Take 1 tablet (25 mg total) by mouth 3 (three) times daily as needed for dizziness. 14 tablet 0   metoprolol   succinate (TOPROL -XL) 50 MG 24 hr tablet TAKE 1 TABLET BY MOUTH EVERY DAY WITH OR IMMEDIATELY FOLLOWING A MEAL 90 tablet 3   Multiple Vitamin (MULTIVITAMIN) tablet Take 1 tablet by mouth daily.       ondansetron  (ZOFRAN ) 8 MG tablet Take 1 tablet (8 mg total) by mouth every 8 (eight) hours as needed for nausea or vomiting. 30 tablet 0   oxyCODONE  (OXY IR/ROXICODONE ) 5 MG immediate release tablet Take 1 tablet (5 mg total) by mouth every 6 (six) hours as needed for moderate pain. 60 tablet 0   pantoprazole  (PROTONIX ) 40 MG tablet Take 1 tablet (40 mg total) by mouth daily before breakfast. 30 tablet 1   Polyethyl Glycol-Propyl Glycol (SYSTANE OP) Place 1 drop into both eyes daily as needed (dry eye).     prochlorperazine  (COMPAZINE ) 10 MG tablet Take 1 tablet (10 mg total) by mouth every 6 (six) hours as needed for nausea or vomiting. 30 tablet 1   sacubitril-valsartan (ENTRESTO ) 49-51 MG TAKE 1 TABLET BY MOUTH TWICE A DAY 60 tablet 11   spironolactone  (ALDACTONE ) 25 MG tablet Take 0.5 tablets (12.5 mg total) by mouth daily. Take half a tablet once daily. 45 tablet 3   No current facility-administered medications for this visit.   Facility-Administered Medications Ordered in Other Visits  Medication Dose Route Frequency Provider Last Rate Last Admin   0.9 %  sodium chloride  infusion   Intravenous Once Sherrod Sherrod, MD       heparin  lock flush 100 unit/mL  500 Units Intracatheter Once Sherrod Sherrod, MD       sodium chloride  flush (NS) 0.9 % injection 10 mL  10 mL Intracatheter Once Sherrod Sherrod, MD        SURGICAL HISTORY:  Past Surgical History:  Procedure Laterality Date   ABDOMINAL HYSTERECTOMY     BILATERAL OOPHORECTOMY     BIOPSY  12/09/2022   Procedure: BIOPSY;  Surgeon: Federico Rosario BROCKS, MD;  Location: [facility] ENDOSCOPY;  Service: Gastroenterology;;   BRONCHIAL BIOPSY  01/09/2023   Procedure: BRONCHIAL BIOPSIES;  Surgeon: Brenna Adine CROME, DO;  Location: [facility] ENDOSCOPY;  Service:  Pulmonary;;   BRONCHIAL NEEDLE ASPIRATION BIOPSY  01/09/2023   Procedure: BRONCHIAL NEEDLE ASPIRATION BIOPSIES;  Surgeon: Brenna Adine CROME, DO;  Location: [facility] ENDOSCOPY;  Service: Pulmonary;;   CATARACT EXTRACTION W/PHACO  12/27/2010   Procedure: CATARACT EXTRACTION PHACO AND INTRAOCULAR LENS PLACEMENT (IOC);  Surgeon: Cherene Mania;  Location: [facility] ORS;  Service: Ophthalmology;  Laterality: Right;   CATARACT EXTRACTION W/PHACO  03/28/2011   Procedure: CATARACT EXTRACTION PHACO AND INTRAOCULAR LENS PLACEMENT (IOC);  Surgeon: Cherene Mania;  Location: [facility] ORS;  Service: Ophthalmology;  Laterality: Left;  CDE 7.27   ENTEROSCOPY N/A 02/28/2023   Procedure: ENTEROSCOPY;  Surgeon: Avram Lupita BRAVO, MD;  Location: [facility] ENDOSCOPY;  Service: Gastroenterology;  Laterality: N/A;   ESOPHAGOGASTRODUODENOSCOPY (EGD) WITH PROPOFOL  N/A 12/09/2022   Procedure: ESOPHAGOGASTRODUODENOSCOPY (EGD) WITH PROPOFOL ;  Surgeon: Federico Rosario BROCKS, MD;  Location: [facility] ENDOSCOPY;  Service: Gastroenterology;  Laterality: N/A;   HOT HEMOSTASIS N/A 02/28/2023   Procedure: HOT HEMOSTASIS (ARGON  PLASMA COAGULATION/BICAP);  Surgeon: Avram Lupita BRAVO, MD;  Location: [facility]OSCOPY;  Service: Gastroenterology;  Laterality: N/A;   IR IMAGING GUIDED PORT INSERTION  03/23/2023   SUBMUCOSAL TATTOO INJECTION  02/28/2023   Procedure: SUBMUCOSAL TATTOO INJECTION;  Surgeon: Avram Lupita BRAVO, MD;  Location: [facility] ENDOSCOPY;  Service: Gastroenterology;;    REVIEW OF SYSTEMS:  Constitutional: positive for fatigue Eyes: negative Ears, nose, mouth, throat, and face: negative Respiratory: positive for cough Cardiovascular: negative Gastrointestinal: negative Genitourinary:negative Integument/breast: negative Hematologic/lymphatic: negative Musculoskeletal:negative Neurological: negative Behavioral/Psych: negative Endocrine: negative Allergic/Immunologic: negative   PHYSICAL EXAMINATION: General appearance: alert, cooperative, fatigued, and no distress Head:  Normocephalic, without obvious abnormality, atraumatic Neck: no adenopathy, no JVD, supple, symmetrical, trachea midline, and thyroid  not enlarged, symmetric, no tenderness/mass/nodules Lymph nodes: Cervical, supraclavicular, and axillary nodes normal. Resp: clear to auscultation bilaterally Back: symmetric, no curvature. ROM normal. No CVA tenderness. Cardio: regular rate and rhythm, S1, S2 normal, no murmur, click, rub or gallop GI: soft, non-tender; bowel sounds normal; no masses,  no organomegaly Extremities: extremities normal, atraumatic, no cyanosis or edema Neurologic: Alert and oriented X 3, normal strength and tone. Normal symmetric reflexes. Normal coordination and gait  ECOG PERFORMANCE STATUS: 1 - Symptomatic but completely ambulatory  Blood pressure 111/62, pulse 72, temperature 97.8 F (36.6 C), temperature source Temporal, resp. rate 17, height 5' 3 (1.6 m), weight 142 lb 12.8 oz (64.8 kg), SpO2 96%.  LABORATORY DATA: Lab Results  Component Value Date   WBC 4.9 06/20/2024   HGB 10.0 (L) 06/20/2024   HCT 29.9 (L) 06/20/2024   MCV 98.0 06/20/2024   PLT 350 06/20/2024      Chemistry      Component Value Date/Time   NA 137 06/20/2024 1435   NA 137 09/27/2023 1224   K 4.3 06/20/2024 1435   CL 103 06/20/2024 1435   CO2 27 06/20/2024 1435   BUN 17 06/20/2024 1435   BUN 26 09/27/2023 1224   CREATININE 1.24 (H) 06/20/2024 1435      Component Value Date/Time   CALCIUM 9.2 06/20/2024 1435   ALKPHOS 135 (H) 06/20/2024 1435   AST 39 06/20/2024 1435   ALT 17 06/20/2024 1435   BILITOT 0.3 06/20/2024 1435       RADIOGRAPHIC STUDIES: CT CHEST ABDOMEN PELVIS W CONTRAST Result Date: 07/04/2024 CLINICAL DATA:  Non-small cell lung cancer (NSCLC), staging. * Tracking Code: BO * EXAM: CT CHEST, ABDOMEN, AND PELVIS WITH CONTRAST TECHNIQUE: Multidetector CT imaging of the chest, abdomen and pelvis was performed following the standard protocol during bolus administration of  intravenous contrast. RADIATION DOSE REDUCTION: This exam was performed according to the departmental dose-optimization program which includes automated exposure control, adjustment of the mA and/or kV according to patient size and/or use of iterative reconstruction technique. CONTRAST:  80mL OMNIPAQUE  IOHEXOL  300 MG/ML  SOLN COMPARISON:  CT scan chest, abdomen and pelvis from 04/17/2024. FINDINGS: CT CHEST FINDINGS Cardiovascular: Normal cardiac size. No pericardial effusion. No aortic aneurysm. There are coronary artery calcifications, in keeping with coronary artery disease. There are also mild to moderate peripheral atherosclerotic vascular calcifications of thoracic aorta and its major branches. Mediastinum/Nodes: Visualized thyroid  gland appears grossly unremarkable. No solid / cystic mediastinal masses. The esophagus is nondistended precluding optimal assessment. No axillary, mediastinal or hilar lymphadenopathy by size criteria. Lungs/Pleura: The central tracheo-bronchial tree is patent. Mild-to-moderate upper lobe predominant emphysematous changes noted. There are patchy areas of linear, plate-like atelectasis and/or scarring throughout bilateral lungs. Redemonstration of irregular marginated solid nodule  in the left upper lobe measuring approximately 11 x 15 mm, essentially similar to the prior study. There is associated opacity reaching up to the mediastinal surface as well as distal ground-glass and linear opacities reaching up to the pleural surface, also unchanged. No new mass or consolidation. No pleural effusion or pneumothorax. Stable subpleural irregular opacity in the lingular segment of left upper lobe measuring approximately 5 x 5 mm (series 4, image 80). There is stable 4-5 mm ground-glass opacity in the left lower lobe (series 4, image 85). No new or suspicious lung nodule. There is interval resolution of ground-glass opacities in the posterior segment of right upper lobe. Musculoskeletal: The  visualized soft tissues of the chest wall are grossly unremarkable. No suspicious osseous lesions. There are mild multilevel degenerative changes in the visualized spine. CT ABDOMEN PELVIS FINDINGS Hepatobiliary: The liver is normal in size. Non-cirrhotic configuration. No suspicious mass. These is mild diffuse hepatic steatosis. No intrahepatic or extrahepatic bile duct dilation. No calcified gallstones. Normal gallbladder wall thickness. No pericholecystic inflammatory changes. Pancreas: Unremarkable. No pancreatic ductal dilatation or surrounding inflammatory changes. Spleen: Within normal limits. No focal lesion. Adrenals/Urinary Tract: Redemonstration of approximately 2.8 x 3.8 cm heterogeneous, low-attenuation, right adrenal lesion without significant interval change. Unremarkable left adrenal gland. No suspicious renal mass. There is a well-circumscribed 1.3 x 1.6 cm simple cyst in the right kidney upper pole. No nephroureterolithiasis or obstructive uropathy on either side. Urinary bladder is under distended, precluding optimal assessment. However, no large mass or stones identified. No perivesical fat stranding. Stomach/Bowel: No disproportionate dilation of the small or large bowel loops. No evidence of abnormal bowel wall thickening or inflammatory changes. The appendix was not visualized; however there is no acute inflammatory process in the right lower quadrant. Vascular/Lymphatic: No ascites or pneumoperitoneum. No abdominal or pelvic lymphadenopathy, by size criteria. No aneurysmal dilation of the major abdominal arteries. There are moderate peripheral atherosclerotic vascular calcifications of the aorta and its major branches. Reproductive: The uterus is surgically absent. No large adnexal mass. Other: There are bilateral small fat containing inguinal hernias. The soft tissues and abdominal wall are otherwise unremarkable. Musculoskeletal: No suspicious osseous lesions. There are moderate multilevel  degenerative changes in the visualized spine. IMPRESSION: 1. Redemonstration of irregular marginated solid nodule in the left upper lobe. Several other smaller lung nodules are also stable when compared to the prior exam from 04/17/2024. No new or suspicious lung nodule. 2. Stable heterogeneous low-attenuation right adrenal lesion. 3. No metastatic disease identified within the chest, abdomen or pelvis. 4. Mild diffuse hepatic steatosis. Aortic Atherosclerosis (ICD10-I70.0) and Emphysema (ICD10-J43.9). Electronically Signed   By: Ree Molt M.D.   On: 07/04/2024 16:35       ASSESSMENT AND PLAN: This is a very pleasant 74 years old African-American female recently diagnosed with a stage IV (T1c, N0, M1b) non-small cell lung cancer, adenocarcinoma with positive KRAS G12C mutation and PD-L1 expression of 1%.  She started systemic chemotherapy with carboplatin  for AUC of 5, Alimta  500 Mg/M2 and Keytruda  200 Mg IV every 3 weeks.  She is status post 22 cycle of her treatment.  Her current dose of Alimta  is 400 mg/M2.  This was reduced secondary to renal insufficiency. She has been tolerating this treatment fairly well except for the fatigue. She had repeat CT scan of the chest, abdomen and pelvis performed recently.  I personally independently reviewed the scan and discussed the result with the patient and her daughter.  Her scan showed no concerning  findings for disease progression. Assessment and Plan Assessment & Plan Stage IV non-small cell lung cancer, adenocarcinoma She has advanced non-small cell lung cancer (NSCLC) with KRAS G12C mutation and low PD-L1 expression, diagnosed July 2024. Initial treatment included carboplatin , pemetrexed , and pembrolizumab  for four cycles, then transitioned to maintenance pemetrexed  and pembrolizumab . Recent imaging demonstrates stable disease without progression or new lesions; most tumor shrinkage occurred early in treatment. Therapy is focused on disease control  and quality of life, with ongoing monitoring for toxicity and disease status. Prognosis remains guarded due to stage IV disease, but she is clinically stable on current regimen. - Proceed with cycle 23 of maintenance pemetrexed  and pembrolizumab . - Continue pembrolizumab  for up to 2 years (approximately 35 cycles) if no progression or significant toxicity; discuss continuation of pemetrexed  monotherapy after 2 years of immunotherapy. - Provide anticipatory guidance regarding the chronic nature of her disease and the necessity of ongoing treatment for disease control.  She was advised to call immediately if she has any other concerning symptoms in the interval. The patient voices understanding of current disease status and treatment options and is in agreement with the current care plan.  All questions were answered. The patient knows to call the clinic with any problems, questions or concerns. We can certainly see the patient much sooner if necessary. The total time spent in the appointment was 30 minutes including review of chart and various tests results, discussions about plan of care and coordination of care plan .   Disclaimer: This note was dictated with voice recognition software. Similar sounding words can inadvertently be transcribed and may not be corrected upon review.        "

## 2024-08-01 ENCOUNTER — Inpatient Hospital Stay: Admitting: Internal Medicine

## 2024-08-01 ENCOUNTER — Inpatient Hospital Stay

## 2024-08-02 ENCOUNTER — Inpatient Hospital Stay: Admitting: Physician Assistant

## 2024-08-02 ENCOUNTER — Inpatient Hospital Stay

## 2024-08-08 ENCOUNTER — Inpatient Hospital Stay: Admitting: Internal Medicine

## 2024-08-08 ENCOUNTER — Inpatient Hospital Stay

## 2024-08-22 ENCOUNTER — Inpatient Hospital Stay: Attending: Physician Assistant

## 2024-08-22 ENCOUNTER — Inpatient Hospital Stay: Admitting: Physician Assistant

## 2024-08-22 ENCOUNTER — Inpatient Hospital Stay

## 2024-08-29 ENCOUNTER — Inpatient Hospital Stay

## 2024-08-29 ENCOUNTER — Inpatient Hospital Stay: Admitting: Internal Medicine

## 2024-09-12 ENCOUNTER — Inpatient Hospital Stay: Attending: Physician Assistant

## 2024-09-12 ENCOUNTER — Inpatient Hospital Stay: Admitting: Internal Medicine

## 2024-09-12 ENCOUNTER — Inpatient Hospital Stay
